# Patient Record
Sex: Female | Born: 1935 | Race: White | Hispanic: No | Marital: Married | Smoking: Never smoker
Health system: Southern US, Community
[De-identification: ages and names within clinical notes are randomized; demographics above are authoritative.]

## PROBLEM LIST (undated history)

## (undated) DIAGNOSIS — Z0000 Encounter for general adult medical examination without abnormal findings: Secondary | ICD-10-CM

## (undated) DIAGNOSIS — I10 Essential (primary) hypertension: Secondary | ICD-10-CM

## (undated) DIAGNOSIS — H5440 Blindness, one eye, unspecified eye: Secondary | ICD-10-CM

## (undated) DIAGNOSIS — C801 Malignant (primary) neoplasm, unspecified: Secondary | ICD-10-CM

## (undated) DIAGNOSIS — Z8719 Personal history of other diseases of the digestive system: Secondary | ICD-10-CM

## (undated) DIAGNOSIS — K5900 Constipation, unspecified: Secondary | ICD-10-CM

## (undated) DIAGNOSIS — M8580 Other specified disorders of bone density and structure, unspecified site: Secondary | ICD-10-CM

## (undated) DIAGNOSIS — K635 Polyp of colon: Secondary | ICD-10-CM

## (undated) DIAGNOSIS — T7840XA Allergy, unspecified, initial encounter: Secondary | ICD-10-CM

## (undated) DIAGNOSIS — Z973 Presence of spectacles and contact lenses: Secondary | ICD-10-CM

## (undated) DIAGNOSIS — R351 Nocturia: Secondary | ICD-10-CM

## (undated) DIAGNOSIS — R739 Hyperglycemia, unspecified: Secondary | ICD-10-CM

## (undated) DIAGNOSIS — K5792 Diverticulitis of intestine, part unspecified, without perforation or abscess without bleeding: Secondary | ICD-10-CM

## (undated) DIAGNOSIS — B09 Unspecified viral infection characterized by skin and mucous membrane lesions: Secondary | ICD-10-CM

## (undated) DIAGNOSIS — K4090 Unilateral inguinal hernia, without obstruction or gangrene, not specified as recurrent: Secondary | ICD-10-CM

## (undated) DIAGNOSIS — M1990 Unspecified osteoarthritis, unspecified site: Secondary | ICD-10-CM

## (undated) DIAGNOSIS — R7303 Prediabetes: Secondary | ICD-10-CM

## (undated) DIAGNOSIS — Z8619 Personal history of other infectious and parasitic diseases: Secondary | ICD-10-CM

## (undated) DIAGNOSIS — Z86010 Personal history of colonic polyps: Secondary | ICD-10-CM

## (undated) DIAGNOSIS — D1720 Benign lipomatous neoplasm of skin and subcutaneous tissue of unspecified limb: Secondary | ICD-10-CM

## (undated) DIAGNOSIS — H409 Unspecified glaucoma: Secondary | ICD-10-CM

## (undated) DIAGNOSIS — K5790 Diverticulosis of intestine, part unspecified, without perforation or abscess without bleeding: Secondary | ICD-10-CM

## (undated) HISTORY — DX: Diverticulitis of intestine, part unspecified, without perforation or abscess without bleeding: K57.92

## (undated) HISTORY — DX: Nocturia: R35.1

## (undated) HISTORY — DX: Unilateral inguinal hernia, without obstruction or gangrene, not specified as recurrent: K40.90

## (undated) HISTORY — DX: Constipation, unspecified: K59.00

## (undated) HISTORY — DX: Blindness, one eye, unspecified eye: H54.40

## (undated) HISTORY — PX: APPENDECTOMY: SHX54

## (undated) HISTORY — DX: Unspecified viral infection characterized by skin and mucous membrane lesions: B09

## (undated) HISTORY — PX: ABDOMINAL HYSTERECTOMY: SHX81

## (undated) HISTORY — PX: EYE SURGERY: SHX253

## (undated) HISTORY — DX: Diverticulosis of intestine, part unspecified, without perforation or abscess without bleeding: K57.90

## (undated) HISTORY — DX: Benign lipomatous neoplasm of skin and subcutaneous tissue of unspecified limb: D17.20

## (undated) HISTORY — DX: Essential (primary) hypertension: I10

## (undated) HISTORY — PX: CATARACT EXTRACTION, BILATERAL: SHX1313

## (undated) HISTORY — DX: Allergy, unspecified, initial encounter: T78.40XA

## (undated) HISTORY — DX: Polyp of colon: K63.5

## (undated) HISTORY — PX: COLONOSCOPY W/ BIOPSIES AND POLYPECTOMY: SHX1376

## (undated) HISTORY — PX: BREAST LUMPECTOMY: SHX2

## (undated) HISTORY — DX: Hyperglycemia, unspecified: R73.9

## (undated) HISTORY — DX: Other specified disorders of bone density and structure, unspecified site: M85.80

## (undated) HISTORY — DX: Personal history of other infectious and parasitic diseases: Z86.19

## (undated) HISTORY — DX: Personal history of colonic polyps: Z86.010

## (undated) HISTORY — DX: Encounter for general adult medical examination without abnormal findings: Z00.00

## (undated) HISTORY — PX: RECTOCELE REPAIR: SHX761

---

## 1999-04-30 ENCOUNTER — Ambulatory Visit (HOSPITAL_COMMUNITY): Admission: RE | Admit: 1999-04-30 | Discharge: 1999-04-30 | Payer: Self-pay | Admitting: Specialist

## 2001-10-05 HISTORY — PX: BLADDER SURGERY: SHX569

## 2010-11-10 ENCOUNTER — Emergency Department (INDEPENDENT_AMBULATORY_CARE_PROVIDER_SITE_OTHER): Payer: Medicare Other

## 2010-11-10 ENCOUNTER — Emergency Department (HOSPITAL_BASED_OUTPATIENT_CLINIC_OR_DEPARTMENT_OTHER)
Admission: EM | Admit: 2010-11-10 | Discharge: 2010-11-10 | Disposition: A | Discharge status: Home / Self Care | Payer: Medicare Other | Attending: Emergency Medicine | Admitting: Emergency Medicine

## 2010-11-10 DIAGNOSIS — I10 Essential (primary) hypertension: Secondary | ICD-10-CM | POA: Insufficient documentation

## 2010-11-10 DIAGNOSIS — S0990XA Unspecified injury of head, initial encounter: Secondary | ICD-10-CM | POA: Insufficient documentation

## 2010-11-10 DIAGNOSIS — W2209XA Striking against other stationary object, initial encounter: Secondary | ICD-10-CM | POA: Insufficient documentation

## 2010-11-10 DIAGNOSIS — R071 Chest pain on breathing: Secondary | ICD-10-CM | POA: Insufficient documentation

## 2010-11-10 DIAGNOSIS — R079 Chest pain, unspecified: Secondary | ICD-10-CM | POA: Insufficient documentation

## 2010-11-10 DIAGNOSIS — Y93K1 Activity, walking an animal: Secondary | ICD-10-CM | POA: Insufficient documentation

## 2010-11-10 LAB — CBC
Hemoglobin: 13.7 g/dL (ref 12.0–15.0)
MCH: 31.6 pg (ref 26.0–34.0)
MCV: 88.7 fL (ref 78.0–100.0)
RBC: 4.33 MIL/uL (ref 3.87–5.11)

## 2010-11-10 LAB — COMPREHENSIVE METABOLIC PANEL
BUN: 17 mg/dL (ref 6–23)
CO2: 30 mEq/L (ref 19–32)
Chloride: 102 mEq/L (ref 96–112)
Creatinine, Ser: 0.8 mg/dL (ref 0.4–1.2)
GFR calc non Af Amer: >60 mL/min (ref >60)
Total Bilirubin: 0.7 mg/dL (ref 0.3–1.2)

## 2010-11-10 LAB — POCT CARDIAC MARKERS
CKMB, poc: 2 ng/mL (ref 1.0–8.0)
Myoglobin, poc: 142 ng/mL (ref 12–200)

## 2011-09-03 ENCOUNTER — Encounter: Payer: Self-pay | Admitting: Internal Medicine

## 2011-09-03 ENCOUNTER — Telehealth: Payer: Self-pay | Admitting: Internal Medicine

## 2011-09-03 ENCOUNTER — Ambulatory Visit (INDEPENDENT_AMBULATORY_CARE_PROVIDER_SITE_OTHER): Payer: Medicare Other | Admitting: Internal Medicine

## 2011-09-03 DIAGNOSIS — I10 Essential (primary) hypertension: Secondary | ICD-10-CM

## 2011-09-03 DIAGNOSIS — H409 Unspecified glaucoma: Secondary | ICD-10-CM

## 2011-09-03 DIAGNOSIS — Z79899 Other long term (current) drug therapy: Secondary | ICD-10-CM

## 2011-09-03 NOTE — Patient Instructions (Signed)
Please take 1/2 tablet of your amlodipine a day for 2 weeks. If your blood pressure remains normal then stop the amlodipine and continue to monitor your blood pressure. Please schedule cbc (401.9) and chem7 (v58.69) prior to next visit

## 2011-09-03 NOTE — Telephone Encounter (Signed)
Future lab order placed for the week of 10/26/11 and prelim order forwarded to the lab.

## 2011-09-06 DIAGNOSIS — H409 Unspecified glaucoma: Secondary | ICD-10-CM | POA: Insufficient documentation

## 2011-09-06 DIAGNOSIS — I10 Essential (primary) hypertension: Secondary | ICD-10-CM | POA: Insufficient documentation

## 2011-09-06 NOTE — Assessment & Plan Note (Signed)
Good control. Pt concerned over continued norvasc use. Do not feel norvasc contributing to eye problems. Decrease norvasc dose to 2.5mg  qd for ~2wks. Monitor bp and if remains normotensive then attempt dc and monitoring. Consider cbc and chem7 with next visit.

## 2011-09-06 NOTE — Progress Notes (Signed)
  Subjective:    Patient ID: Amber Morgan, female    DOB: Apr 04, 1927, 77 y.o.   MRN: [id number]  HPI Pt presents to clinic to est care and for follow up of HTN. Has chronic decreased visual acuity followed by optho with h/o ?glaucoma. States she is concerned norvasc is causing swelling in eye. optho has not identified the medication as adverse. BP reviewed as normotensive. bp log reviewed. Stable mild leg swelling unchanged after taking norvasc. States labs utd with pmd. No other complaints.  Past Medical History  Diagnosis Date  . History of chicken pox     childhood  . Diverticulitis   . HTN (hypertension)   . Colon polyps   . Urinary incontinence    Past Surgical History  Procedure Date  . Eye surgery     multiple bilateral  . Bladder surgery 2003    bladder tact  . Abdominal hysterectomy     reports that she has never smoked. She has never used smokeless tobacco. She reports that she does not drink alcohol. Her drug history not on file. family history includes Colon cancer in her mother; Heart disease in her father; and Hypertension in her mother. No Known Allergies   Review of Systems  Eyes: Positive for visual disturbance.  Cardiovascular: Positive for leg swelling.  All other systems reviewed and are negative.       Objective:   Physical Exam  Physical Exam  Nursing note and vitals reviewed. Constitutional: Appears well-developed and well-nourished. No distress.  HENT:  Head: Normocephalic and atraumatic.  Right Ear: External ear normal.  Left Ear: External ear normal.  Eyes: Conjunctivae are normal. No scleral icterus.  Neck: Neck supple. Carotid bruit is not present.  Cardiovascular: Normal rate, regular rhythm and normal heart sounds.  Exam reveals no gallop and no friction rub.   No murmur heard. Pulmonary/Chest: Effort normal and breath sounds normal. No respiratory distress. He has no wheezes. no rales.  Lymphadenopathy:    He has no cervical  adenopathy.  Neurological:Alert.  Skin: Skin is warm and dry. Not diaphoretic.  Psychiatric: Has a normal mood and affect.        Assessment & Plan:

## 2011-09-16 ENCOUNTER — Telehealth: Payer: Self-pay | Admitting: Internal Medicine

## 2011-09-16 NOTE — Telephone Encounter (Signed)
Received medical records from [facility]-Dr. Brandt Loosen

## 2011-10-27 ENCOUNTER — Other Ambulatory Visit: Payer: Self-pay | Admitting: Internal Medicine

## 2011-10-28 LAB — CBC WITH DIFFERENTIAL/PLATELET
Eosinophils Absolute: 0 10*3/uL (ref 0.0–0.7)
Hemoglobin: 13.8 g/dL (ref 12.0–15.0)
Lymphs Abs: 2.2 10*3/uL (ref 0.7–4.0)
MCH: 31.3 pg (ref 26.0–34.0)
MCV: 95.2 fL (ref 78.0–100.0)
Monocytes Relative: 8 % (ref 3–12)
Neutrophils Relative %: 61 % (ref 43–77)
RBC: 4.41 MIL/uL (ref 3.87–5.11)

## 2011-10-28 LAB — BASIC METABOLIC PANEL
CO2: 34 mEq/L — ABNORMAL HIGH (ref 19–32)
Calcium: 10.4 mg/dL (ref 8.4–10.5)
Chloride: 95 mEq/L — ABNORMAL LOW (ref 96–112)
Glucose, Bld: 95 mg/dL (ref 70–99)
Sodium: 136 mEq/L (ref 135–145)

## 2011-11-02 ENCOUNTER — Ambulatory Visit (INDEPENDENT_AMBULATORY_CARE_PROVIDER_SITE_OTHER): Payer: Medicare Other | Admitting: Internal Medicine

## 2011-11-02 ENCOUNTER — Encounter: Payer: Self-pay | Admitting: Internal Medicine

## 2011-11-02 DIAGNOSIS — E785 Hyperlipidemia, unspecified: Secondary | ICD-10-CM

## 2011-11-02 DIAGNOSIS — I10 Essential (primary) hypertension: Secondary | ICD-10-CM

## 2011-11-02 MED ORDER — LOSARTAN POTASSIUM 50 MG PO TABS: 50.0000 mg | ORAL_TABLET | Freq: Every day | ORAL | Status: DC

## 2011-11-02 NOTE — Patient Instructions (Signed)
Please take your atenolol every other day for 10 days then stop the medication. Begin your new blood pressure medication losartan now- taking it once a day in the morning.

## 2011-11-14 DIAGNOSIS — E785 Hyperlipidemia, unspecified: Secondary | ICD-10-CM | POA: Insufficient documentation

## 2011-11-14 NOTE — Progress Notes (Signed)
  Subjective:    Patient ID: Amber Morgan, female    DOB: 30-Apr-1927, 78 y.o.   MRN: [id number]  HPI Pt presents to clinic for followup of multiple medical problems. BP reviewed as nl. States pm bp 140's. Successfully off norvasc. Worried about beta blocker with beta blocker gtts. Reviewed nl cbc, chem7. No other complaint.  Past Medical History  Diagnosis Date  . History of chicken pox     childhood  . Diverticulitis   . HTN (hypertension)   . Colon polyps   . Urinary incontinence    Past Surgical History  Procedure Date  . Eye surgery     multiple bilateral  . Bladder surgery 2003    bladder tact  . Abdominal hysterectomy     reports that she has never smoked. She has never used smokeless tobacco. She reports that she does not drink alcohol. Her drug history not on file. family history includes Colon cancer in her mother; Heart disease in her father; and Hypertension in her mother. No Known Allergies    Review of Systems see hpi     Objective:   Physical Exam  Nursing note and vitals reviewed. Constitutional: She appears well-developed and well-nourished. No distress.  HENT:  Head: Normocephalic and atraumatic.  Eyes: Conjunctivae are normal. No scleral icterus.  Neck: Neck supple.  Cardiovascular: Normal rate, regular rhythm and normal heart sounds.   Pulmonary/Chest: Effort normal and breath sounds normal.  Neurological: She is alert.  Skin: She is not diaphoretic.  Psychiatric: She has a normal mood and affect.          Assessment & Plan:

## 2011-11-14 NOTE — Assessment & Plan Note (Signed)
Wean atenolol off. Add losartan 50mg  qd. Monitor bp as outpt and follow up in clinic as scheduled.

## 2011-11-14 NOTE — Assessment & Plan Note (Signed)
Not currently requiring medication. Obtain lipid/lft prior to next visit

## 2011-11-24 ENCOUNTER — Ambulatory Visit (INDEPENDENT_AMBULATORY_CARE_PROVIDER_SITE_OTHER): Payer: Medicare Other | Admitting: Internal Medicine

## 2011-11-24 ENCOUNTER — Encounter: Payer: Self-pay | Admitting: Internal Medicine

## 2011-11-24 DIAGNOSIS — I10 Essential (primary) hypertension: Secondary | ICD-10-CM

## 2011-11-24 MED ORDER — ATENOLOL 25 MG PO TABS: 25.0000 mg | ORAL_TABLET | Freq: Every day | ORAL | Status: DC

## 2011-11-24 NOTE — Assessment & Plan Note (Signed)
Loss of BP control. Resume original regimen. rf atenolol. Keep f/u appt and call with bp or other concerns.

## 2011-11-24 NOTE — Progress Notes (Signed)
  Subjective:    Patient ID: Amber Morgan, female    DOB: [birth year], 78 y.o.   MRN: [id number]  HPI Pt presents to clinic for follow up of HTN. Last visit weaned off betablocker and arb was added. BP's were under average control until last several days had sbp 150's-170's. Asx. Was concerned and went back to original regimen atenolol, hctz and norvasc with normalization of bp's. Feels more comfortable with this regimen.  Past Medical History  Diagnosis Date  . History of chicken pox     childhood  . Diverticulitis   . HTN (hypertension)   . Colon polyps   . Urinary incontinence    Past Surgical History  Procedure Date  . Eye surgery     multiple bilateral  . Bladder surgery 2003    bladder tact  . Abdominal hysterectomy     reports that she has never smoked. She has never used smokeless tobacco. She reports that she does not drink alcohol. Her drug history not on file. family history includes Colon cancer in her mother; Heart disease in her father; and Hypertension in her mother. No Known Allergies   Review of Systems see hpi     Objective:   Physical Exam  Nursing note and vitals reviewed. Constitutional: She appears well-developed and well-nourished. No distress.  HENT:  Head: Normocephalic and atraumatic.  Neurological: She is alert.  Skin: She is not diaphoretic.  Psychiatric: She has a normal mood and affect.          Assessment & Plan:

## 2011-12-28 ENCOUNTER — Ambulatory Visit: Payer: Medicare Other | Admitting: Internal Medicine

## 2011-12-31 ENCOUNTER — Ambulatory Visit: Payer: Medicare Other | Admitting: Internal Medicine

## 2012-01-13 ENCOUNTER — Telehealth: Payer: Self-pay | Admitting: Internal Medicine

## 2012-01-13 NOTE — Telephone Encounter (Signed)
Patient is unsure if she needs labs prior to 01/26/12 visit?

## 2012-01-13 NOTE — Telephone Encounter (Signed)
Too soon. Next f/u visit will get labs

## 2012-01-13 NOTE — Telephone Encounter (Signed)
Labs drawn in January for cbc and bmp; there is no future order for labs requested.

## 2012-01-13 NOTE — Telephone Encounter (Signed)
Call placed to patient at [phone], she was informed per Dr Rodena Medin instructions.

## 2012-01-18 ENCOUNTER — Encounter: Payer: Self-pay | Admitting: Internal Medicine

## 2012-01-26 ENCOUNTER — Ambulatory Visit (INDEPENDENT_AMBULATORY_CARE_PROVIDER_SITE_OTHER): Payer: Medicare Other | Admitting: Internal Medicine

## 2012-01-26 ENCOUNTER — Encounter: Payer: Self-pay | Admitting: Internal Medicine

## 2012-01-26 ENCOUNTER — Telehealth: Payer: Self-pay | Admitting: Internal Medicine

## 2012-01-26 VITALS — BP 132/72 | HR 61 | Temp 97.7°F | Ht 59.5 in | Wt 129.0 lb

## 2012-01-26 DIAGNOSIS — L0390 Cellulitis, unspecified: Secondary | ICD-10-CM

## 2012-01-26 DIAGNOSIS — Z79899 Other long term (current) drug therapy: Secondary | ICD-10-CM

## 2012-01-26 DIAGNOSIS — E785 Hyperlipidemia, unspecified: Secondary | ICD-10-CM

## 2012-01-26 DIAGNOSIS — I10 Essential (primary) hypertension: Secondary | ICD-10-CM

## 2012-01-26 MED ORDER — CEPHALEXIN 500 MG PO CAPS: 500.0000 mg | ORAL_CAPSULE | Freq: Three times a day (TID) | ORAL | Status: DC

## 2012-01-26 NOTE — Progress Notes (Signed)
  Subjective:    Patient ID: Amber Morgan, female    DOB: 1926-12-03, 78 y.o.   MRN: [id number]  HPI Pt presents to clinic for followup of multiple medical problems. Tolerating medication without adverse effect. Home bp log reviewed primarily normotensive with rare exception. Notes two weeks ago while trimming roses stuck right thumb with thorn. Has persistent mild st swelling and erythema without f/c or drainage. Taking no medication for the problem. No alleviating or exacerbating factors.   Past Medical History  Diagnosis Date  . History of chicken pox     childhood  . Diverticulitis   . HTN (hypertension)   . Colon polyps   . Urinary incontinence    Past Surgical History  Procedure Date  . Eye surgery     multiple bilateral  . Bladder surgery 2003    bladder tact  . Abdominal hysterectomy     reports that she has never smoked. She has never used smokeless tobacco. She reports that she does not drink alcohol. Her drug history not on file. family history includes Colon cancer in her mother; Heart disease in her father; and Hypertension in her mother. No Known Allergies    Review of Systems see hpi     Objective:   Physical Exam  Physical Exam  Nursing note and vitals reviewed. Constitutional: Appears well-developed and well-nourished. No distress.  HENT:  Head: Normocephalic and atraumatic.  Right Ear: External ear normal.  Left Ear: External ear normal.  Eyes: Conjunctivae are normal. No scleral icterus.  Neck: Neck supple. Carotid bruit is not present.  Cardiovascular: Normal rate, regular rhythm and normal heart sounds.  Exam reveals no gallop and no friction rub.   No murmur heard. Pulmonary/Chest: Effort normal and breath sounds normal. No respiratory distress. He has no wheezes. no rales.  Lymphadenopathy:    He has no cervical adenopathy.  Neurological:Alert.  Skin: Skin is warm and dry. Not diaphoretic. right thumb- mild erythema. No wound/opening. No  discharge. NT. FROM Psychiatric: Has a normal mood and affect.        Assessment & Plan:

## 2012-01-26 NOTE — Patient Instructions (Signed)
Please schedule fasting labs prior to next visit Lipid/lft-272.4 and chem7-v58.69 

## 2012-01-26 NOTE — Assessment & Plan Note (Signed)
Attempt po course of abx. Followup if no improvement or worsening.  

## 2012-01-26 NOTE — Telephone Encounter (Signed)
Lab order entered for July 2013. 

## 2012-01-26 NOTE — Assessment & Plan Note (Signed)
Normotensive and stable. Continue current regimen. Monitor bp as outpt and followup in clinic as scheduled. Obtain chem7 prior to next visit 

## 2012-01-26 NOTE — Assessment & Plan Note (Signed)
Obtain lipid/lft prior to next visit 

## 2012-02-01 ENCOUNTER — Telehealth: Payer: Self-pay | Admitting: *Deleted

## 2012-02-01 NOTE — Telephone Encounter (Signed)
Patient called and left voice message stating she was given a Rx for antibiotic for her finger. She is still having her tingling in her thumb radiating up her arm. Her message states that she still has a hard spot.  Call was returned to patient at [phone]. Patient states she will taking the last antibiotic today and was not sure if she needed to refill the medication. She stated she still has a hard place on her thumb and tingling. She was asked about redness and swelling. She stated there is no swelling or redness. She was advised to complete antibiotics and if there is no improvement over the next couple of days she was advised to call back. Patient verbalized understanding and agrees as instructed.

## 2012-02-03 ENCOUNTER — Ambulatory Visit (HOSPITAL_BASED_OUTPATIENT_CLINIC_OR_DEPARTMENT_OTHER)
Admission: RE | Admit: 2012-02-03 | Discharge: 2012-02-03 | Disposition: A | Discharge status: Home / Self Care | Payer: Medicare Other | Source: Ambulatory Visit | Attending: Internal Medicine | Admitting: Internal Medicine

## 2012-02-03 ENCOUNTER — Ambulatory Visit (INDEPENDENT_AMBULATORY_CARE_PROVIDER_SITE_OTHER): Payer: Medicare Other | Admitting: Internal Medicine

## 2012-02-03 ENCOUNTER — Encounter: Payer: Self-pay | Admitting: Internal Medicine

## 2012-02-03 VITALS — BP 124/60 | HR 56 | Temp 97.5°F | Resp 16 | Wt 129.0 lb

## 2012-02-03 DIAGNOSIS — M79644 Pain in right finger(s): Secondary | ICD-10-CM

## 2012-02-03 DIAGNOSIS — L0291 Cutaneous abscess, unspecified: Secondary | ICD-10-CM

## 2012-02-03 DIAGNOSIS — S61209A Unspecified open wound of unspecified finger without damage to nail, initial encounter: Secondary | ICD-10-CM

## 2012-02-03 DIAGNOSIS — L0390 Cellulitis, unspecified: Secondary | ICD-10-CM

## 2012-02-03 DIAGNOSIS — Z043 Encounter for examination and observation following other accident: Secondary | ICD-10-CM

## 2012-02-03 DIAGNOSIS — M19049 Primary osteoarthritis, unspecified hand: Secondary | ICD-10-CM | POA: Insufficient documentation

## 2012-02-03 DIAGNOSIS — M79609 Pain in unspecified limb: Secondary | ICD-10-CM

## 2012-02-03 DIAGNOSIS — X58XXXA Exposure to other specified factors, initial encounter: Secondary | ICD-10-CM

## 2012-02-03 MED ORDER — AMOXICILLIN-POT CLAVULANATE 875-125 MG PO TABS: 1.0000 | ORAL_TABLET | Freq: Two times a day (BID) | ORAL | Status: AC

## 2012-02-03 NOTE — Assessment & Plan Note (Signed)
Obtain plain xray of thumb. reattempt abx course with augmentin.

## 2012-02-03 NOTE — Progress Notes (Signed)
  Subjective:    Patient ID: Amber Morgan, female    DOB: 1927/09/11, 78 y.o.   MRN: [id number]  HPI Pt presents to clinic for re-evaluation of thumb swelling. S/p thorn puncture would s/p abx course completed yesterday. Notes improvement of redness and st swelling but notes firm area along ventral thumb. No drainage. Has FROM. No alleviating or exacerbating factors. Taking no medication currently for the problem. No other complaint  Past Medical History  Diagnosis Date  . History of chicken pox     childhood  . Diverticulitis   . HTN (hypertension)   . Colon polyps   . Urinary incontinence    Past Surgical History  Procedure Date  . Eye surgery     multiple bilateral  . Bladder surgery 2003    bladder tact  . Abdominal hysterectomy     reports that she has never smoked. She has never used smokeless tobacco. She reports that she does not drink alcohol. Her drug history not on file. family history includes Colon cancer in her mother; Heart disease in her father; and Hypertension in her mother. No Known Allergies   Review of Systems see hpi     Objective:   Physical Exam  Nursing note and vitals reviewed. Constitutional: She appears well-developed and well-nourished. No distress.  HENT:  Head: Normocephalic and atraumatic.  Eyes: Conjunctivae are normal. No scleral icterus.  Neurological: She is alert.  Skin: Skin is warm and dry. No rash noted. She is not diaphoretic. No erythema.       Right thumb: FROM. NT. No erythema warm or drainge. No wound. Sl prominence right ventral thumb. No mass appreciated  Psychiatric: She has a normal mood and affect.          Assessment & Plan:

## 2012-04-18 LAB — LIPID PANEL
Cholesterol: 190 mg/dL (ref 0–200)
Triglycerides: 85 mg/dL (ref <150)

## 2012-04-18 NOTE — Addendum Note (Signed)
Addended by: Regis Bill on: 04/18/2012 09:09 AM   Modules accepted: Orders

## 2012-04-19 LAB — BASIC METABOLIC PANEL
BUN: 21 mg/dL (ref 6–23)
Calcium: 9.8 mg/dL (ref 8.4–10.5)
Creat: 0.88 mg/dL (ref 0.50–1.10)
Glucose, Bld: 95 mg/dL (ref 70–99)

## 2012-04-19 LAB — HEPATIC FUNCTION PANEL
ALT: 17 U/L (ref 0–35)
AST: 20 U/L (ref 0–37)
Bilirubin, Direct: 0.1 mg/dL (ref 0.0–0.3)
Indirect Bilirubin: 0.6 mg/dL (ref 0.0–0.9)
Total Protein: 6.2 g/dL (ref 6.0–8.3)

## 2012-04-26 ENCOUNTER — Encounter: Payer: Self-pay | Admitting: Internal Medicine

## 2012-04-26 ENCOUNTER — Telehealth: Payer: Self-pay | Admitting: Internal Medicine

## 2012-04-26 ENCOUNTER — Ambulatory Visit (INDEPENDENT_AMBULATORY_CARE_PROVIDER_SITE_OTHER): Payer: Medicare Other | Admitting: Internal Medicine

## 2012-04-26 VITALS — BP 120/72 | HR 56 | Temp 97.7°F | Resp 14 | Wt 127.0 lb

## 2012-04-26 DIAGNOSIS — I10 Essential (primary) hypertension: Secondary | ICD-10-CM

## 2012-04-26 DIAGNOSIS — R209 Unspecified disturbances of skin sensation: Secondary | ICD-10-CM

## 2012-04-26 DIAGNOSIS — E785 Hyperlipidemia, unspecified: Secondary | ICD-10-CM

## 2012-04-26 DIAGNOSIS — R202 Paresthesia of skin: Secondary | ICD-10-CM

## 2012-04-26 DIAGNOSIS — Z79899 Other long term (current) drug therapy: Secondary | ICD-10-CM

## 2012-04-26 NOTE — Assessment & Plan Note (Signed)
Suspect distal arm nerve impingement but spontaneously improving. Continue to monitor.

## 2012-04-26 NOTE — Progress Notes (Signed)
  Subjective:    Patient ID: Amber Morgan, female    DOB: Nov 03, 1926, 78 y.o.   MRN: [id number]  HPI Pt presents to clinic for followup of multiple medical problems. BP reviewed normotensive and home values nl as well. Notes intermittent dry mouth and increased urination late in the day and evening. Takes hctz in the am. Has intermittent right thumb tingling that may radiate retrograde to lower arm. No neck pain, weakness or radicular neck pain. Feels like the sensation is spontaneously improving.   Past Medical History  Diagnosis Date  . History of chicken pox     childhood  . Diverticulitis   . HTN (hypertension)   . Colon polyps   . Urinary incontinence    Past Surgical History  Procedure Date  . Eye surgery     multiple bilateral  . Bladder surgery 2003    bladder tact  . Abdominal hysterectomy     reports that she has never smoked. She has never used smokeless tobacco. She reports that she does not drink alcohol. Her drug history not on file. family history includes Colon cancer in her mother; Heart disease in her father; and Hypertension in her mother. No Known Allergies    Review of Systems see hpi     Objective:   Physical Exam  Nursing note and vitals reviewed. Constitutional: She appears well-developed and well-nourished. No distress.  HENT:  Head: Normocephalic and atraumatic.  Eyes: Conjunctivae are normal. No scleral icterus.  Musculoskeletal:       Right hand/arm -FROM. No thenar muscle wasting. Phalen's negative  Neurological: She is alert.  Skin: Skin is warm and dry. She is not diaphoretic.  Psychiatric: She has a normal mood and affect.          Assessment & Plan:

## 2012-04-26 NOTE — Patient Instructions (Signed)
Please schedule labs prior to your next appointment chem7-v58.69, cbc-401.9

## 2012-04-26 NOTE — Assessment & Plan Note (Signed)
Reviewed lipid with good control.

## 2012-04-26 NOTE — Assessment & Plan Note (Signed)
Normotensive and stable. Continue current regimen. Monitor bp as outpt and followup in clinic as scheduled.  

## 2012-05-11 NOTE — Telephone Encounter (Signed)
Future Lab orders placed/SLS

## 2012-07-13 ENCOUNTER — Ambulatory Visit (INDEPENDENT_AMBULATORY_CARE_PROVIDER_SITE_OTHER): Payer: Medicare Other

## 2012-07-13 DIAGNOSIS — Z23 Encounter for immunization: Secondary | ICD-10-CM

## 2012-08-08 ENCOUNTER — Other Ambulatory Visit: Payer: Self-pay | Admitting: *Deleted

## 2012-08-08 MED ORDER — ATENOLOL 25 MG PO TABS: 25.0000 mg | ORAL_TABLET | Freq: Every day | ORAL | Status: DC

## 2012-08-08 MED ORDER — AMLODIPINE BESYLATE 5 MG PO TABS: 5.0000 mg | ORAL_TABLET | Freq: Every day | ORAL | Status: DC

## 2012-08-08 MED ORDER — HYDROCHLOROTHIAZIDE 25 MG PO TABS: 25.0000 mg | ORAL_TABLET | Freq: Every day | ORAL | Status: DC

## 2012-08-08 NOTE — Progress Notes (Signed)
Per patient request/SLS 

## 2012-08-15 LAB — CBC WITH DIFFERENTIAL/PLATELET
Basophils Absolute: 0 10*3/uL (ref 0.0–0.1)
HCT: 35.9 % — ABNORMAL LOW (ref 36.0–46.0)
Hemoglobin: 12.5 g/dL (ref 12.0–15.0)
Lymphocytes Relative: 33 % (ref 12–46)
Lymphs Abs: 1.9 10*3/uL (ref 0.7–4.0)
Monocytes Absolute: 0.5 10*3/uL (ref 0.1–1.0)
Monocytes Relative: 9 % (ref 3–12)
Neutro Abs: 3.3 10*3/uL (ref 1.7–7.7)
RBC: 4.03 MIL/uL (ref 3.87–5.11)
RDW: 13.1 % (ref 11.5–15.5)
WBC: 5.8 10*3/uL (ref 4.0–10.5)

## 2012-08-15 LAB — BASIC METABOLIC PANEL
Calcium: 9.5 mg/dL (ref 8.4–10.5)
Sodium: 134 mEq/L — ABNORMAL LOW (ref 135–145)

## 2012-08-15 NOTE — Addendum Note (Signed)
Addended by: Regis Bill on: 08/15/2012 11:24 AM   Modules accepted: Orders

## 2012-08-15 NOTE — Telephone Encounter (Signed)
Lab orders released/SLS 

## 2012-08-22 ENCOUNTER — Encounter: Payer: Self-pay | Admitting: Internal Medicine

## 2012-08-22 ENCOUNTER — Ambulatory Visit (INDEPENDENT_AMBULATORY_CARE_PROVIDER_SITE_OTHER): Payer: Medicare Other | Admitting: Internal Medicine

## 2012-08-22 VITALS — BP 114/58 | HR 49 | Temp 98.0°F | Resp 14 | Wt 128.2 lb

## 2012-08-22 DIAGNOSIS — I10 Essential (primary) hypertension: Secondary | ICD-10-CM

## 2012-08-22 DIAGNOSIS — R202 Paresthesia of skin: Secondary | ICD-10-CM

## 2012-08-22 DIAGNOSIS — R209 Unspecified disturbances of skin sensation: Secondary | ICD-10-CM

## 2012-08-22 NOTE — Progress Notes (Signed)
  Subjective:    Patient ID: Amber Morgan, female    DOB: May 28, 1927, 78 y.o.   MRN: [id number]  HPI Pt presents to clinic for followup of multiple medical problems. Previous right thumb numbness after injury has resolved. Blood pressure reviewed as normotensive. Weight stable. Reviewed recent Chem-7 was only minimally low sodium. Mental status and volume status felt to be normal. Compliant with medication without adverse effect.  Past Medical History  Diagnosis Date  . History of chicken pox     childhood  . Diverticulitis   . HTN (hypertension)   . Colon polyps   . Urinary incontinence    Past Surgical History  Procedure Date  . Eye surgery     multiple bilateral  . Bladder surgery 2003    bladder tact  . Abdominal hysterectomy     reports that she has never smoked. She has never used smokeless tobacco. She reports that she does not drink alcohol. Her drug history not on file. family history includes Colon cancer in her mother; Heart disease in her father; and Hypertension in her mother. No Known Allergies    Review of Systems see hpi     Objective:   Physical Exam  Physical Exam  Nursing note and vitals reviewed. Constitutional: Appears well-developed and well-nourished. No distress.  HENT:  Head: Normocephalic and atraumatic.  Right Ear: External ear normal.  Left Ear: External ear normal.  Eyes: Conjunctivae are normal. No scleral icterus.  Neck: Neck supple. Carotid bruit is not present.  Cardiovascular: Normal rate, regular rhythm and normal heart sounds.  Exam reveals no gallop and no friction rub.   No murmur heard. Pulmonary/Chest: Effort normal and breath sounds normal. No respiratory distress. He has no wheezes. no rales.  Lymphadenopathy:    He has no cervical adenopathy.  Neurological:Alert.  Skin: Skin is warm and dry. Not diaphoretic.  Psychiatric: Has a normal mood and affect.        Assessment & Plan:

## 2012-08-22 NOTE — Patient Instructions (Signed)
Please schedule chem7 v58.69 prior to next visit 

## 2012-08-26 NOTE — Assessment & Plan Note (Signed)
Resolved

## 2012-08-26 NOTE — Assessment & Plan Note (Signed)
Normotensive and stable. Continue current regimen. Monitor bp as outpt and followup in clinic as scheduled.  

## 2012-10-31 ENCOUNTER — Telehealth: Payer: Self-pay

## 2012-10-31 DIAGNOSIS — Z79899 Other long term (current) drug therapy: Secondary | ICD-10-CM

## 2012-10-31 NOTE — Telephone Encounter (Signed)
Labs ordered.

## 2012-10-31 NOTE — Addendum Note (Signed)
Addended by: Court Joy on: 10/31/2012 05:10 PM   Modules accepted: Orders

## 2012-11-01 LAB — BASIC METABOLIC PANEL
BUN: 15 mg/dL (ref 6–23)
CO2: 33 mEq/L — ABNORMAL HIGH (ref 19–32)
Chloride: 101 mEq/L (ref 96–112)
Potassium: 4.2 mEq/L (ref 3.5–5.3)

## 2012-11-01 NOTE — Telephone Encounter (Signed)
Quick Note:  Patient Informed and voiced understanding ______ 

## 2012-11-08 ENCOUNTER — Ambulatory Visit (INDEPENDENT_AMBULATORY_CARE_PROVIDER_SITE_OTHER): Payer: Medicare Other | Admitting: Family Medicine

## 2012-11-08 ENCOUNTER — Encounter: Payer: Self-pay | Admitting: Family Medicine

## 2012-11-08 VITALS — BP 138/64 | HR 54 | Temp 97.4°F | Ht 59.25 in | Wt 131.1 lb

## 2012-11-08 DIAGNOSIS — I10 Essential (primary) hypertension: Secondary | ICD-10-CM

## 2012-11-08 DIAGNOSIS — H409 Unspecified glaucoma: Secondary | ICD-10-CM

## 2012-11-08 NOTE — Patient Instructions (Addendum)
Rel of rec Dr Hazle Quant, last clinic note

## 2012-11-12 NOTE — Assessment & Plan Note (Signed)
Well controlled today, no changes to meds. 

## 2012-11-12 NOTE — Assessment & Plan Note (Signed)
Following with opthamology and tolerating drops

## 2012-11-12 NOTE — Progress Notes (Signed)
Patient ID: Amber Morgan, female   DOB: [birth year], 79 y.o.   MRN: [id number] Amber Morgan [id number] 1926/11/11 11/12/2012      Progress Note-Follow Up  Subjective  Chief Complaint  Chief Complaint  Patient presents with  . Follow-up    3 month     HPI  Patient is an 79 year old Caucasian female who is in today with her husband. She generally been in good health. No recent illness. No fevers, headache, chest pain, palpitations, shortness of breath, GI or GU complaints. She's recently been diagnosed with glaucoma but is tolerating her treatments well. No other new complaints noted.  Past Medical History  Diagnosis Date  . History of chicken pox     childhood  . Diverticulitis   . HTN (hypertension)   . Colon polyps   . Urinary incontinence     Past Surgical History  Procedure Laterality Date  . Eye surgery      multiple bilateral  . Bladder surgery  2003    bladder tact  . Abdominal hysterectomy      Family History  Problem Relation Age of Onset  . Colon cancer Mother   . Hypertension Mother   . Heart disease Father     History   Social History  . Marital Status: Married    Spouse Name: N/A    Number of Children: N/A  . Years of Education: N/A   Occupational History  . Not on file.   Social History Main Topics  . Smoking status: Never Smoker   . Smokeless tobacco: Never Used  . Alcohol Use: No  . Drug Use: Not on file  . Sexually Active: Not on file   Other Topics Concern  . Not on file   Social History Narrative  . No narrative on file    Current Outpatient Prescriptions on File Prior to Visit  Medication Sig Dispense Refill  . amLODipine (NORVASC) 5 MG tablet Take 1 tablet (5 mg total) by mouth daily.  90 tablet  1  . atenolol (TENORMIN) 25 MG tablet Take 1 tablet (25 mg total) by mouth daily.  90 tablet  1  . hydrochlorothiazide (HYDRODIURIL) 25 MG tablet Take 1 tablet (25 mg total) by mouth daily.  90 tablet  1  . ketorolac (ACULAR) 0.5 %  ophthalmic solution Place 1 drop into the right eye 2 (two) times daily.      . Multiple Vitamin (MULTIVITAMIN) tablet Take 1 tablet by mouth daily.        Marland Kitchen Propylene Glycol (SYSTANE BALANCE OP) Apply 1 drop to eye daily as needed.      . timolol (TIMOPTIC) 0.25 % ophthalmic solution Place 1 drop into the left eye 2 (two) times daily.        No current facility-administered medications on file prior to visit.    No Known Allergies  Review of Systems  Review of Systems  Constitutional: Negative for fever and malaise/fatigue.  HENT: Negative for congestion.   Eyes: Negative for discharge.  Respiratory: Negative for shortness of breath.   Cardiovascular: Negative for chest pain, palpitations and leg swelling.  Gastrointestinal: Negative for nausea, abdominal pain and diarrhea.  Genitourinary: Negative for dysuria.  Musculoskeletal: Negative for falls.  Skin: Negative for rash.  Neurological: Negative for loss of consciousness and headaches.  Endo/Heme/Allergies: Negative for polydipsia.  Psychiatric/Behavioral: Negative for depression and suicidal ideas. The patient is not nervous/anxious and does not have insomnia.     Objective  BP 138/64  Pulse 54  Temp(Src) 97.4 F (36.3 C) (Oral)  Ht 4' 11.25" (1.505 m)  Wt 131 lb 1.3 oz (59.457 kg)  BMI 26.25 kg/m2  SpO2 90%  Physical Exam  Physical Exam  Constitutional: She is oriented to person, place, and time and well-developed, well-nourished, and in no distress. No distress.  HENT:  Head: Normocephalic and atraumatic.  Eyes: Conjunctivae are normal.  Neck: Neck supple. No thyromegaly present.  Cardiovascular: Normal rate, regular rhythm and normal heart sounds.   No murmur heard. Pulmonary/Chest: Effort normal and breath sounds normal. She has no wheezes.  Abdominal: She exhibits no distension and no mass.  Musculoskeletal: She exhibits no edema.  Lymphadenopathy:    She has no cervical adenopathy.  Neurological: She is  alert and oriented to person, place, and time.  Skin: Skin is warm and dry. No rash noted. She is not diaphoretic.  Psychiatric: Memory, affect and judgment normal.    No results found for this basename: TSH   Lab Results  Component Value Date   WBC 5.8 08/15/2012   HGB 12.5 08/15/2012   HCT 35.9* 08/15/2012   MCV 89.1 08/15/2012   PLT 225 08/15/2012   Lab Results  Component Value Date   CREATININE 0.79 10/31/2012   BUN 15 10/31/2012   NA 140 10/31/2012   K 4.2 10/31/2012   CL 101 10/31/2012   CO2 33* 10/31/2012   Lab Results  Component Value Date   ALT 17 04/18/2012   AST 20 04/18/2012   ALKPHOS 59 04/18/2012   BILITOT 0.7 04/18/2012   Lab Results  Component Value Date   CHOL 190 04/18/2012   Lab Results  Component Value Date   HDL 51 04/18/2012   Lab Results  Component Value Date   LDLCALC 122* 04/18/2012   Lab Results  Component Value Date   TRIG 85 04/18/2012   Lab Results  Component Value Date   CHOLHDL 3.7 04/18/2012     Assessment & Plan  HTN (hypertension) Well controlled today, no changes to meds.  Glaucoma Following with opthamology and tolerating drops

## 2013-01-24 ENCOUNTER — Telehealth: Payer: Self-pay

## 2013-01-24 NOTE — Telephone Encounter (Signed)
Per md inform pt that mammogram results were normal.   Patient informed and copy sent to be scanned

## 2013-02-03 ENCOUNTER — Encounter: Payer: Self-pay | Admitting: Family Medicine

## 2013-02-06 ENCOUNTER — Ambulatory Visit (INDEPENDENT_AMBULATORY_CARE_PROVIDER_SITE_OTHER): Payer: Medicare Other | Admitting: Family Medicine

## 2013-02-06 ENCOUNTER — Encounter: Payer: Self-pay | Admitting: Family Medicine

## 2013-02-06 ENCOUNTER — Telehealth: Payer: Self-pay | Admitting: Internal Medicine

## 2013-02-06 VITALS — BP 138/78 | HR 54 | Temp 97.8°F | Ht 59.5 in | Wt 127.1 lb

## 2013-02-06 DIAGNOSIS — I10 Essential (primary) hypertension: Secondary | ICD-10-CM

## 2013-02-06 DIAGNOSIS — R351 Nocturia: Secondary | ICD-10-CM

## 2013-02-06 DIAGNOSIS — D649 Anemia, unspecified: Secondary | ICD-10-CM

## 2013-02-06 DIAGNOSIS — R350 Frequency of micturition: Secondary | ICD-10-CM

## 2013-02-06 DIAGNOSIS — K5900 Constipation, unspecified: Secondary | ICD-10-CM

## 2013-02-06 DIAGNOSIS — E785 Hyperlipidemia, unspecified: Secondary | ICD-10-CM

## 2013-02-06 HISTORY — DX: Constipation, unspecified: K59.00

## 2013-02-06 LAB — CBC
MCH: 30.8 pg (ref 26.0–34.0)
MCHC: 34.3 g/dL (ref 30.0–36.0)
Platelets: 257 10*3/uL (ref 150–400)
RDW: 13.4 % (ref 11.5–15.5)

## 2013-02-06 MED ORDER — AMLODIPINE BESYLATE 5 MG PO TABS: 5.0000 mg | ORAL_TABLET | Freq: Every day | ORAL | Status: DC

## 2013-02-06 MED ORDER — ATENOLOL 25 MG PO TABS: 25.0000 mg | ORAL_TABLET | Freq: Every day | ORAL | Status: DC

## 2013-02-06 MED ORDER — HYDROCHLOROTHIAZIDE 25 MG PO TABS: 25.0000 mg | ORAL_TABLET | Freq: Every day | ORAL | Status: DC

## 2013-02-06 NOTE — Telephone Encounter (Signed)
Refill- hydrochlorothiazide 25mg  tab. Take one tablet by mouth every day. Qty 90 last fill 2.1.14  Refill- atenolol 25mg  tab. Take one tablet by mouth every day. Qty 90 last fill 2.1.14  Refill- amlodipine 5mg  tab. Take one tablet by mouth every day. Qty 90 last fill 2.1.14

## 2013-02-06 NOTE — Patient Instructions (Addendum)
  Next visit annual lipid, renal, cbc, tsh, hepatic  Hypertension As your heart beats, it forces blood through your arteries. This force is your blood pressure. If the pressure is too high, it is called hypertension (HTN) or high blood pressure. HTN is dangerous because you may have it and not know it. High blood pressure may mean that your heart has to work harder to pump blood. Your arteries may be narrow or stiff. The extra work puts you at risk for heart disease, stroke, and other problems.  Blood pressure consists of two numbers, a higher number over a lower, 110/72, for example. It is stated as "110 over 72." The ideal is below 120 for the top number (systolic) and under 80 for the bottom (diastolic). Write down your blood pressure today. You should pay close attention to your blood pressure if you have certain conditions such as:  Heart failure.  Prior heart attack.  Diabetes  Chronic kidney disease.  Prior stroke.  Multiple risk factors for heart disease. To see if you have HTN, your blood pressure should be measured while you are seated with your arm held at the level of the heart. It should be measured at least twice. A one-time elevated blood pressure reading (especially in the Emergency Department) does not mean that you need treatment. There may be conditions in which the blood pressure is different between your right and left arms. It is important to see your caregiver soon for a recheck. Most people have essential hypertension which means that there is not a specific cause. This type of high blood pressure may be lowered by changing lifestyle factors such as:  Stress.  Smoking.  Lack of exercise.  Excessive weight.  Drug/tobacco/alcohol use.  Eating less salt. Most people do not have symptoms from high blood pressure until it has caused damage to the body. Effective treatment can often prevent, delay or reduce that damage. TREATMENT  When a cause has been identified,  treatment for high blood pressure is directed at the cause. There are a large number of medications to treat HTN. These fall into several categories, and your caregiver will help you select the medicines that are best for you. Medications may have side effects. You should review side effects with your caregiver. If your blood pressure stays high after you have made lifestyle changes or started on medicines,   Your medication(s) may need to be changed.  Other problems may need to be addressed.  Be certain you understand your prescriptions, and know how and when to take your medicine.  Be sure to follow up with your caregiver within the time frame advised (usually within two weeks) to have your blood pressure rechecked and to review your medications.  If you are taking more than one medicine to lower your blood pressure, make sure you know how and at what times they should be taken. Taking two medicines at the same time can result in blood pressure that is too low. SEEK IMMEDIATE MEDICAL CARE IF:  You develop a severe headache, blurred or changing vision, or confusion.  You have unusual weakness or numbness, or a faint feeling.  You have severe chest or abdominal pain, vomiting, or breathing problems. MAKE SURE YOU:   Understand these instructions.  Will watch your condition.  Will get help right away if you are not doing well or get worse. Document Released: 09/21/2005 Document Revised: 12/14/2011 Document Reviewed: 05/11/2008 [facility] Patient Information 2013 [place], [place].

## 2013-02-06 NOTE — Telephone Encounter (Signed)
RX's filled while pt was in the office today

## 2013-02-07 ENCOUNTER — Encounter: Payer: Self-pay | Admitting: *Deleted

## 2013-02-07 LAB — URINALYSIS
Hgb urine dipstick: NEGATIVE
Ketones, ur: NEGATIVE mg/dL
Nitrite: NEGATIVE
Urobilinogen, UA: 0.2 mg/dL (ref 0.0–1.0)

## 2013-02-08 ENCOUNTER — Encounter: Payer: Self-pay | Admitting: Family Medicine

## 2013-02-08 DIAGNOSIS — R351 Nocturia: Secondary | ICD-10-CM

## 2013-02-08 HISTORY — DX: Nocturia: R35.1

## 2013-02-08 NOTE — Assessment & Plan Note (Signed)
Urinalysis normal. Likely related to pelvic floor dysfunction has had a sling in past which was helpful initially, may need to return to urology for further evaluation.

## 2013-02-08 NOTE — Progress Notes (Signed)
Patient ID: Amber Morgan, female   DOB: 08-08-1927, 79 y.o.   MRN: [id number] IDALY VERRET [id number] 31-May-1927 02/08/2013      Progress Note-Follow Up  Subjective  Chief Complaint  Chief Complaint  Patient presents with  . Follow-up    3 month    HPI  Patient is an 79 year old Caucasian female who is in today for followup. Overall she's doing well. She struggles with constipation and urinary frequency. Frequency is most notable at night and less obvious during the day. No dysuria. No abdominal pain. No bloody or tarry stool. She had a bladder sling and a rectocele repair several years ago and notes her symptoms are recently worsening once again. No chest pain, palpitations, shortness of breath, fevers or chills noted at this time  Past Medical History  Diagnosis Date  . History of chicken pox     childhood  . Diverticulitis   . HTN (hypertension)   . Colon polyps   . Urinary incontinence   . Unspecified constipation 02/06/2013  . Nocturia 02/08/2013    Past Surgical History  Procedure Laterality Date  . Eye surgery      multiple bilateral  . Bladder surgery  2003    bladder tact  . Abdominal hysterectomy      Family History  Problem Relation Age of Onset  . Colon cancer Mother   . Hypertension Mother   . Heart disease Father     History   Social History  . Marital Status: Married    Spouse Name: N/A    Number of Children: N/A  . Years of Education: N/A   Occupational History  . Not on file.   Social History Main Topics  . Smoking status: Never Smoker   . Smokeless tobacco: Never Used  . Alcohol Use: No  . Drug Use: Not on file  . Sexually Active: Not on file   Other Topics Concern  . Not on file   Social History Narrative  . No narrative on file    Current Outpatient Prescriptions on File Prior to Visit  Medication Sig Dispense Refill  . ketorolac (ACULAR) 0.5 % ophthalmic solution Place 1 drop into the right eye 2 (two) times daily.      .  Multiple Vitamin (MULTIVITAMIN) tablet Take 1 tablet by mouth daily.        Marland Kitchen Propylene Glycol (SYSTANE BALANCE OP) Apply 1 drop to eye daily as needed.      . timolol (TIMOPTIC) 0.25 % ophthalmic solution Place 1 drop into the left eye 2 (two) times daily.        No current facility-administered medications on file prior to visit.    No Known Allergies  Review of Systems  Review of Systems  Constitutional: Negative for fever and malaise/fatigue.  HENT: Negative for congestion.   Eyes: Negative for discharge.  Respiratory: Negative for shortness of breath.   Cardiovascular: Negative for chest pain, palpitations and leg swelling.  Gastrointestinal: Positive for constipation. Negative for nausea, abdominal pain and diarrhea.  Genitourinary: Positive for frequency. Negative for dysuria.  Musculoskeletal: Negative for falls.  Skin: Negative for rash.  Neurological: Negative for loss of consciousness and headaches.  Endo/Heme/Allergies: Negative for polydipsia.  Psychiatric/Behavioral: Negative for depression and suicidal ideas. The patient is not nervous/anxious and does not have insomnia.     Objective  BP 138/78  Pulse 54  Temp(Src) 97.8 F (36.6 C) (Oral)  Ht 4' 11.5" (1.511 m)  Wt 127 lb 1.3  oz (57.643 kg)  BMI 25.25 kg/m2  SpO2 96%  Physical Exam  Physical Exam  Constitutional: She is oriented to person, place, and time and well-developed, well-nourished, and in no distress. No distress.  HENT:  Head: Normocephalic and atraumatic.  Eyes: Conjunctivae are normal.  Neck: Neck supple. No thyromegaly present.  Cardiovascular: Normal rate, regular rhythm and normal heart sounds.   No murmur heard. Pulmonary/Chest: Effort normal and breath sounds normal. She has no wheezes.  Abdominal: She exhibits no distension and no mass.  Musculoskeletal: She exhibits no edema.  Lymphadenopathy:    She has no cervical adenopathy.  Neurological: She is alert and oriented to person,  place, and time.  Skin: Skin is warm and dry. No rash noted. She is not diaphoretic.  Psychiatric: Memory, affect and judgment normal.    No results found for this basename: TSH   Lab Results  Component Value Date   WBC 7.9 02/06/2013   HGB 13.1 02/06/2013   HCT 38.2 02/06/2013   MCV 89.9 02/06/2013   PLT 257 02/06/2013   Lab Results  Component Value Date   CREATININE 0.79 10/31/2012   BUN 15 10/31/2012   NA 140 10/31/2012   K 4.2 10/31/2012   CL 101 10/31/2012   CO2 33* 10/31/2012   Lab Results  Component Value Date   ALT 17 04/18/2012   AST 20 04/18/2012   ALKPHOS 59 04/18/2012   BILITOT 0.7 04/18/2012   Lab Results  Component Value Date   CHOL 190 04/18/2012   Lab Results  Component Value Date   HDL 51 04/18/2012   Lab Results  Component Value Date   LDLCALC 122* 04/18/2012   Lab Results  Component Value Date   TRIG 85 04/18/2012   Lab Results  Component Value Date   CHOLHDL 3.7 04/18/2012     Assessment & Plan  Nocturia Urinalysis normal. Likely related to pelvic floor dysfunction has had a sling in past which was helpful initially, may need to return to urology for further evaluation.  Unspecified constipation Encouraged probiotics and fiber, increase fluids  Hyperlipidemia Avoid trans fats, consider krill oil caps. Increase activity  HTN (hypertension) Well controlled no changes.

## 2013-02-08 NOTE — Assessment & Plan Note (Signed)
Encouraged probiotics and fiber, increase fluids

## 2013-02-08 NOTE — Assessment & Plan Note (Signed)
Well controlled no changes 

## 2013-02-08 NOTE — Assessment & Plan Note (Signed)
Avoid trans fats, consider krill oil caps. Increase activity

## 2013-03-06 ENCOUNTER — Ambulatory Visit: Payer: Medicare Other | Admitting: Family

## 2013-05-08 ENCOUNTER — Telehealth: Payer: Self-pay

## 2013-05-08 DIAGNOSIS — I10 Essential (primary) hypertension: Secondary | ICD-10-CM

## 2013-05-08 DIAGNOSIS — R202 Paresthesia of skin: Secondary | ICD-10-CM

## 2013-05-08 DIAGNOSIS — E785 Hyperlipidemia, unspecified: Secondary | ICD-10-CM

## 2013-05-08 LAB — RENAL FUNCTION PANEL
BUN: 18 mg/dL (ref 6–23)
CO2: 30 mEq/L (ref 19–32)
Chloride: 100 mEq/L (ref 96–112)
Glucose, Bld: 99 mg/dL (ref 70–99)
Phosphorus: 4.2 mg/dL (ref 2.3–4.6)
Potassium: 4.4 mEq/L (ref 3.5–5.3)

## 2013-05-08 LAB — CBC
HCT: 37.9 % (ref 36.0–46.0)
Hemoglobin: 13.2 g/dL (ref 12.0–15.0)
MCH: 31.4 pg (ref 26.0–34.0)
MCHC: 34.8 g/dL (ref 30.0–36.0)

## 2013-05-08 LAB — LIPID PANEL: Cholesterol: 182 mg/dL (ref 0–200)

## 2013-05-08 LAB — HEPATIC FUNCTION PANEL
AST: 19 U/L (ref 0–37)
Albumin: 4 g/dL (ref 3.5–5.2)
Alkaline Phosphatase: 56 U/L (ref 39–117)
Total Bilirubin: 0.7 mg/dL (ref 0.3–1.2)

## 2013-05-08 NOTE — Telephone Encounter (Signed)
Lab order placed.

## 2013-05-11 ENCOUNTER — Ambulatory Visit (INDEPENDENT_AMBULATORY_CARE_PROVIDER_SITE_OTHER): Payer: Medicare Other | Admitting: Family Medicine

## 2013-05-11 ENCOUNTER — Encounter: Payer: Self-pay | Admitting: Family Medicine

## 2013-05-11 VITALS — BP 126/60 | HR 53 | Temp 97.5°F | Ht 59.5 in | Wt 129.0 lb

## 2013-05-11 DIAGNOSIS — B088 Other specified viral infections characterized by skin and mucous membrane lesions: Secondary | ICD-10-CM

## 2013-05-11 DIAGNOSIS — B09 Unspecified viral infection characterized by skin and mucous membrane lesions: Secondary | ICD-10-CM

## 2013-05-11 DIAGNOSIS — H409 Unspecified glaucoma: Secondary | ICD-10-CM

## 2013-05-11 DIAGNOSIS — I10 Essential (primary) hypertension: Secondary | ICD-10-CM

## 2013-05-11 DIAGNOSIS — L578 Other skin changes due to chronic exposure to nonionizing radiation: Secondary | ICD-10-CM

## 2013-05-11 DIAGNOSIS — E785 Hyperlipidemia, unspecified: Secondary | ICD-10-CM

## 2013-05-11 DIAGNOSIS — K5900 Constipation, unspecified: Secondary | ICD-10-CM

## 2013-05-11 HISTORY — DX: Unspecified viral infection characterized by skin and mucous membrane lesions: B09

## 2013-05-11 NOTE — Assessment & Plan Note (Addendum)
Witch Hazel Astringent to clean area and consider Acyclovir to see if helps, may call for rx  HSV vs Varicella

## 2013-05-11 NOTE — Assessment & Plan Note (Addendum)
Moving bowels more regularly but encouraged probiotic and fiber

## 2013-05-11 NOTE — Patient Instructions (Addendum)
Start a krill oil caps daily such as MegaRed by [employer]  Consider soaking thumb in distilled white vinegar and 1/2 warm water each night and applying vick's vapor rub  Probiotic daily   Cholesterol Cholesterol is a white, waxy, fat-like protein needed by your body in small amounts. The liver makes all the cholesterol you need. It is carried from the liver by the blood through the blood vessels. Deposits (plaque) may build up on blood vessel walls. This makes the arteries narrower and stiffer. Plaque increases the risk for heart attack and stroke. You cannot feel your cholesterol level even if it is very high. The only way to know is by a blood test to check your lipid (fats) levels. Once you know your cholesterol levels, you should keep a record of the test results. Work with your caregiver to to keep your levels in the desired range. WHAT THE RESULTS MEAN:  Total cholesterol is a rough measure of all the cholesterol in your blood.  LDL is the so-called bad cholesterol. This is the type that deposits cholesterol in the walls of the arteries. You want this level to be low.  HDL is the good cholesterol because it cleans the arteries and carries the LDL away. You want this level to be high.  Triglycerides are fat that the body can either burn for energy or store. High levels are closely linked to heart disease. DESIRED LEVELS:  Total cholesterol below 200.  LDL below 100 for people at risk, below 70 for very high risk.  HDL above 50 is good, above 60 is best.  Triglycerides below 150. HOW TO LOWER YOUR CHOLESTEROL:  Diet.  Choose fish or white meat chicken and [place], roasted or baked. Limit fatty cuts of red meat, fried foods, and processed meats, such as sausage and lunch meat.  Eat lots of fresh fruits and vegetables. Choose whole grains, beans, pasta, potatoes and cereals.  Use only small amounts of olive, corn or canola oils. Avoid butter, mayonnaise, shortening or palm kernel  oils. Avoid foods with trans-fats.  Use skim/nonfat milk and low-fat/nonfat yogurt and cheeses. Avoid whole milk, cream, ice cream, egg yolks and cheeses. Healthy desserts include angel food cake, ginger snaps, animal crackers, hard candy, popsicles, and low-fat/nonfat frozen yogurt. Avoid pastries, cakes, pies and cookies.  Exercise.  A regular program helps decrease LDL and raises HDL.  Helps with weight control.  Do things that increase your activity level like gardening, walking, or taking the stairs.  Medication.  May be prescribed by your caregiver to help lowering cholesterol and the risk for heart disease.  You may need medicine even if your levels are normal if you have several risk factors. HOME CARE INSTRUCTIONS   Follow your diet and exercise programs as suggested by your caregiver.  Take medications as directed.  Have blood work done when your caregiver feels it is necessary. MAKE SURE YOU:   Understand these instructions.  Will watch your condition.  Will get help right away if you are not doing well or get worse. Document Released: 06/16/2001 Document Revised: 12/14/2011 Document Reviewed: 12/07/2007 [facility] Patient Information 2014 [place], [place].

## 2013-05-14 NOTE — Assessment & Plan Note (Signed)
Mild, avoid transplants add Krill oil capsule.

## 2013-05-14 NOTE — Progress Notes (Signed)
Patient ID: Amber Morgan, female   DOB: [birth year], 79 y.o.   MRN: [id number] Amber Morgan [id number] 28-Apr-1927 05/14/2013      Progress Note-Follow Up  Subjective  Chief Complaint  Chief Complaint  Patient presents with  . Annual Exam    physical    HPI  Patient is an 79 year old Caucasian female who is in today for routine medical care. She feels well. She's not had any recent illness. No fevers or headaches. No chest pain, palpitations, shortness of breath, GI or GU concerns. She takes her medications as prescribed. Her husbands health is improved and does she is feeling better.  Past Medical History  Diagnosis Date  . History of chicken pox     childhood  . Diverticulitis   . HTN (hypertension)   . Colon polyps   . Urinary incontinence   . Unspecified constipation 02/06/2013  . Nocturia 02/08/2013  . Viral infection characterized by skin and mucous membrane lesions 05/11/2013    Past Surgical History  Procedure Laterality Date  . Eye surgery      multiple bilateral  . Bladder surgery  2003    bladder tact  . Abdominal hysterectomy      Family History  Problem Relation Age of Onset  . Colon cancer Mother   . Hypertension Mother   . Heart disease Father   . Meniere's disease Father   . Heart disease Sister   . Arthritis Son     back  . Glaucoma Son   . Hypertension Son   . Cataracts Son   . Colon cancer Sister   . Alzheimer's disease Sister   . Glaucoma Sister   . Glaucoma Sister   . Hyperlipidemia Sister   . Hypertension Sister   . Cancer Sister     thyroid  . Diabetes Sister   . Glaucoma Sister     History   Social History  . Marital Status: Married    Spouse Name: N/A    Number of Children: N/A  . Years of Education: N/A   Occupational History  . Not on file.   Social History Main Topics  . Smoking status: Never Smoker   . Smokeless tobacco: Never Used  . Alcohol Use: No  . Drug Use: Not on file  . Sexually Active: Not on file    Other Topics Concern  . Not on file   Social History Narrative  . No narrative on file    Current Outpatient Prescriptions on File Prior to Visit  Medication Sig Dispense Refill  . amLODipine (NORVASC) 5 MG tablet Take 1 tablet (5 mg total) by mouth daily.  90 tablet  2  . atenolol (TENORMIN) 25 MG tablet Take 1 tablet (25 mg total) by mouth daily.  90 tablet  2  . hydrochlorothiazide (HYDRODIURIL) 25 MG tablet Take 1 tablet (25 mg total) by mouth daily.  90 tablet  2  . ketorolac (ACULAR) 0.5 % ophthalmic solution Place 1 drop into the right eye 2 (two) times daily.      . Multiple Vitamin (MULTIVITAMIN) tablet Take 1 tablet by mouth daily.        Marland Kitchen Propylene Glycol (SYSTANE BALANCE OP) Apply 1 drop to eye daily as needed.       No current facility-administered medications on file prior to visit.    No Known Allergies  Review of Systems  Review of Systems  Constitutional: Negative for fever and malaise/fatigue.  HENT: Negative for congestion.  Eyes: Negative for discharge.  Respiratory: Negative for shortness of breath.   Cardiovascular: Negative for chest pain, palpitations and leg swelling.  Gastrointestinal: Negative for nausea, abdominal pain and diarrhea.  Genitourinary: Negative for dysuria.  Musculoskeletal: Negative for falls.  Skin: Negative for rash.  Neurological: Negative for loss of consciousness and headaches.  Endo/Heme/Allergies: Negative for polydipsia.  Psychiatric/Behavioral: Negative for depression and suicidal ideas. The patient is not nervous/anxious and does not have insomnia.     Objective  BP 126/60  Pulse 53  Temp(Src) 97.5 F (36.4 C) (Oral)  Ht 4' 11.5" (1.511 m)  Wt 129 lb (58.514 kg)  BMI 25.63 kg/m2  SpO2 97%  Physical Exam  Physical Exam  Constitutional: She is oriented to person, place, and time and well-developed, well-nourished, and in no distress. No distress.  HENT:  Head: Normocephalic and atraumatic.  Right Ear:  External ear normal.  Left Ear: External ear normal.  Nose: Nose normal.  Mouth/Throat: Oropharynx is clear and moist. No oropharyngeal exudate.  Eyes: Conjunctivae are normal. Pupils are equal, round, and reactive to light. Right eye exhibits no discharge. Left eye exhibits no discharge. No scleral icterus.  Neck: Normal range of motion. Neck supple. No thyromegaly present.  Cardiovascular: Normal rate, regular rhythm, normal heart sounds and intact distal pulses.   No murmur heard. Pulmonary/Chest: Effort normal and breath sounds normal. No respiratory distress. She has no wheezes. She has no rales.  Abdominal: Soft. Bowel sounds are normal. She exhibits no distension and no mass. There is no tenderness.  Musculoskeletal: Normal range of motion. She exhibits no edema and no tenderness.  Lymphadenopathy:    She has no cervical adenopathy.  Neurological: She is alert and oriented to person, place, and time. She has normal reflexes. No cranial nerve deficit. Coordination normal.  Skin: Skin is warm and dry. No rash noted. She is not diaphoretic.  Psychiatric: Mood, memory and affect normal.    Lab Results  Component Value Date   TSH 3.308 05/08/2013   Lab Results  Component Value Date   WBC 5.9 05/08/2013   HGB 13.2 05/08/2013   HCT 37.9 05/08/2013   MCV 90.0 05/08/2013   PLT 255 05/08/2013   Lab Results  Component Value Date   CREATININE 0.93 05/08/2013   BUN 18 05/08/2013   NA 139 05/08/2013   K 4.4 05/08/2013   CL 100 05/08/2013   CO2 30 05/08/2013   Lab Results  Component Value Date   ALT 17 05/08/2013   AST 19 05/08/2013   ALKPHOS 56 05/08/2013   BILITOT 0.7 05/08/2013   Lab Results  Component Value Date   CHOL 182 05/08/2013   Lab Results  Component Value Date   HDL 58 05/08/2013   Lab Results  Component Value Date   LDLCALC 108* 05/08/2013   Lab Results  Component Value Date   TRIG 81 05/08/2013   Lab Results  Component Value Date   CHOLHDL 3.1 05/08/2013     Assessment &  Plan  Viral infection characterized by skin and mucous membrane lesions Witch Hazel Astringent to clean area and consider Acyclovir to see if helps, may call for rx  HSV vs Varicella  Unspecified constipation Moving bowels more regularly but encouraged probiotic and fiber  HTN (hypertension) Well controlled no changes  Hyperlipidemia Mild, avoid transplants add Krill oil capsule.  Glaucoma No recent changs

## 2013-05-14 NOTE — Assessment & Plan Note (Signed)
Well controlled no changes 

## 2013-05-14 NOTE — Assessment & Plan Note (Signed)
No recent changs

## 2013-05-21 ENCOUNTER — Encounter (HOSPITAL_BASED_OUTPATIENT_CLINIC_OR_DEPARTMENT_OTHER): Payer: Self-pay | Admitting: *Deleted

## 2013-05-21 ENCOUNTER — Emergency Department (HOSPITAL_BASED_OUTPATIENT_CLINIC_OR_DEPARTMENT_OTHER)
Admission: EM | Admit: 2013-05-21 | Discharge: 2013-05-21 | Disposition: A | Discharge status: Home / Self Care | Payer: Medicare Other | Attending: Emergency Medicine | Admitting: Emergency Medicine

## 2013-05-21 DIAGNOSIS — Y929 Unspecified place or not applicable: Secondary | ICD-10-CM | POA: Insufficient documentation

## 2013-05-21 DIAGNOSIS — W540XXA Bitten by dog, initial encounter: Secondary | ICD-10-CM | POA: Insufficient documentation

## 2013-05-21 DIAGNOSIS — Z8719 Personal history of other diseases of the digestive system: Secondary | ICD-10-CM | POA: Insufficient documentation

## 2013-05-21 DIAGNOSIS — I10 Essential (primary) hypertension: Secondary | ICD-10-CM | POA: Insufficient documentation

## 2013-05-21 DIAGNOSIS — Z23 Encounter for immunization: Secondary | ICD-10-CM | POA: Insufficient documentation

## 2013-05-21 DIAGNOSIS — Z860100 Personal history of colon polyps, unspecified: Secondary | ICD-10-CM | POA: Insufficient documentation

## 2013-05-21 DIAGNOSIS — L03119 Cellulitis of unspecified part of limb: Secondary | ICD-10-CM

## 2013-05-21 DIAGNOSIS — Z79899 Other long term (current) drug therapy: Secondary | ICD-10-CM | POA: Insufficient documentation

## 2013-05-21 DIAGNOSIS — Z8619 Personal history of other infectious and parasitic diseases: Secondary | ICD-10-CM | POA: Insufficient documentation

## 2013-05-21 DIAGNOSIS — S61451A Open bite of right hand, initial encounter: Secondary | ICD-10-CM

## 2013-05-21 DIAGNOSIS — Y939 Activity, unspecified: Secondary | ICD-10-CM | POA: Insufficient documentation

## 2013-05-21 DIAGNOSIS — L02519 Cutaneous abscess of unspecified hand: Secondary | ICD-10-CM | POA: Insufficient documentation

## 2013-05-21 DIAGNOSIS — S61409A Unspecified open wound of unspecified hand, initial encounter: Secondary | ICD-10-CM | POA: Insufficient documentation

## 2013-05-21 LAB — BASIC METABOLIC PANEL
BUN: 15 mg/dL (ref 6–23)
CO2: 30 mEq/L (ref 19–32)
Calcium: 10.3 mg/dL (ref 8.4–10.5)
Creatinine, Ser: 0.8 mg/dL (ref 0.50–1.10)
GFR calc Af Amer: 77 mL/min — ABNORMAL LOW (ref >90)
GFR calc non Af Amer: 66 mL/min — ABNORMAL LOW (ref >90)
Potassium: 3 mEq/L — ABNORMAL LOW (ref 3.5–5.1)
Sodium: 139 mEq/L (ref 135–145)

## 2013-05-21 LAB — CBC
HCT: 38 % (ref 36.0–46.0)
Hemoglobin: 13.2 g/dL (ref 12.0–15.0)
MCH: 32.4 pg (ref 26.0–34.0)
RBC: 4.07 MIL/uL (ref 3.87–5.11)

## 2013-05-21 MED ORDER — AMOXICILLIN-POT CLAVULANATE 875-125 MG PO TABS: 1.0000 | ORAL_TABLET | Freq: Two times a day (BID) | ORAL | Status: DC

## 2013-05-21 MED ORDER — SODIUM CHLORIDE 0.9 % IV SOLN
3.0000 g | Freq: Once | INTRAVENOUS | Status: AC
  Administered 2013-05-21: 3 g via INTRAVENOUS
  Filled 2013-05-21: qty 3

## 2013-05-21 MED ORDER — NAPROXEN SODIUM 275 MG PO TABS: 275.0000 mg | ORAL_TABLET | Freq: Two times a day (BID) | ORAL | Status: DC

## 2013-05-21 MED ORDER — TETANUS-DIPHTH-ACELL PERTUSSIS 5-2.5-18.5 LF-MCG/0.5 IM SUSP
0.5000 mL | Freq: Once | INTRAMUSCULAR | Status: AC
  Administered 2013-05-21: 0.5 mL via INTRAMUSCULAR
  Filled 2013-05-21: qty 0.5

## 2013-05-21 MED ORDER — POTASSIUM CHLORIDE CRYS ER 20 MEQ PO TBCR
40.0000 meq | EXTENDED_RELEASE_TABLET | Freq: Once | ORAL | Status: AC
  Administered 2013-05-21: 40 meq via ORAL
  Filled 2013-05-21: qty 2

## 2013-05-21 NOTE — Consult Note (Signed)
ORTHOPAEDIC CONSULTATION HISTORY & PHYSICAL REQUESTING PHYSICIAN: Ethelda Chick, MD  Chief Complaint: R hand dog bite  HPI: Amber Morgan is a 79 y.o. female who complains of right hand pain and swelling following a dog bite that occurred on Friday night.    Past Medical History  Diagnosis Date  . History of chicken pox     childhood  . Diverticulitis   . HTN (hypertension)   . Colon polyps   . Urinary incontinence   . Unspecified constipation 02/06/2013  . Nocturia 02/08/2013  . Viral infection characterized by skin and mucous membrane lesions 05/11/2013   Past Surgical History  Procedure Laterality Date  . Eye surgery      multiple bilateral  . Bladder surgery  2003    bladder tact  . Abdominal hysterectomy     History   Social History  . Marital Status: Married    Spouse Name: N/A    Number of Children: N/A  . Years of Education: N/A   Social History Main Topics  . Smoking status: Never Smoker   . Smokeless tobacco: Never Used  . Alcohol Use: No  . Drug Use: None  . Sexual Activity: None   Other Topics Concern  . None   Social History Narrative  . None   Family History  Problem Relation Age of Onset  . Colon cancer Mother   . Hypertension Mother   . Heart disease Father   . Meniere's disease Father   . Heart disease Sister   . Arthritis Son     back  . Glaucoma Son   . Hypertension Son   . Cataracts Son   . Colon cancer Sister   . Alzheimer's disease Sister   . Glaucoma Sister   . Glaucoma Sister   . Hyperlipidemia Sister   . Hypertension Sister   . Cancer Sister     thyroid  . Diabetes Sister   . Glaucoma Sister    No Known Allergies Prior to Admission medications   Medication Sig Start Date End Date Taking? Authorizing Provider  amLODipine (NORVASC) 5 MG tablet Take 1 tablet (5 mg total) by mouth daily. 02/06/13   Bradd Canary, MD  atenolol (TENORMIN) 25 MG tablet Take 1 tablet (25 mg total) by mouth daily. 02/06/13   Bradd Canary, MD   hydrochlorothiazide (HYDRODIURIL) 25 MG tablet Take 1 tablet (25 mg total) by mouth daily. 02/06/13   Bradd Canary, MD  ketorolac (ACULAR) 0.5 % ophthalmic solution Place 1 drop into the right eye 2 (two) times daily.    Historical Provider, MD  Multiple Vitamin (MULTIVITAMIN) tablet Take 1 tablet by mouth daily.      Historical Provider, MD  Propylene Glycol (SYSTANE BALANCE OP) Apply 1 drop to eye daily as needed.    Historical Provider, MD  timolol (BETIMOL) 0.5 % ophthalmic solution Place 1 drop into the left eye 2 (two) times daily.    Historical Provider, MD   No results found.  Positive ROS: All other systems have been reviewed and were otherwise negative with the exception of those mentioned in the HPI and as above.  Physical Exam: Vitals: Refer to EMR. Constitutional:  WD, WN, NAD HEENT:  NCAT, EOMI Neuro/Psych:  Alert & oriented to person, place, and time; appropriate mood & affect Lymphatic: No generalized UE edema or lymphadenopathy Extremities / MSK:  The extremities are normal with respect to appearance, ranges of motion, joint stability, muscle strength/tone, sensation, & perfusion  except as otherwise noted:   2 small puncture wounds/lacerations, one volar 1st distal web, 2nd on dorsum.  Great MPJ ROM without pain, full digital flexion and extension, including IF, limited just by tightness of edema.  Mild erythema dorsum of hand distal and radial.    Assessment: 2-day old dog bite  Plan: Oral antibiotics (augmentin), NSAIDS, and f/u in my office this week. Call if worsening.  Cliffton Asters Janee Morn, MD     [place] [phone] Orthopaedic & Hand Surgery [facility] Orthopaedic [facility] Medicine [facility] [address] [place], [place]  [address] [phone]

## 2013-05-21 NOTE — ED Notes (Signed)
Pt c/o dog bite to right hand x 3 days ago , pts own dog with updated shots

## 2013-05-21 NOTE — ED Notes (Signed)
Pt bit by own dog on Friday night, bite marks noted on right hand front side and back side, area with 3+ edema and redness, c/o some pain

## 2013-05-21 NOTE — ED Provider Notes (Signed)
CSN: [id number]     Arrival date & time 05/21/13  1019 History     First MD Initiated Contact with Patient 05/21/13 1200     Chief Complaint  Patient presents with  . Animal Bite   (Consider location/radiation/quality/duration/timing/severity/associated sxs/prior Treatment) Patient is a 79 y.o. female presenting with animal bite. The history is provided by the patient.  Animal Bite Associated symptoms: no fever, no numbness and no rash     79 year old female presents emergency department chief complaint of right hand pain.  Patient was bitten by her tach muscle tear he or 3 days ago on the right hand.  She states that 2 days ago she began having warmth, swelling, heat and tenderness which has progressively worsened.  She denies fevers, chills, myalgias, arthralgias abdominal pain nausea or vomiting.  She has full range of motion of the fingers good grip strength and denies severe pain.  The patient brings paperwork showing that her dog is up-to-date on all of his vaccinations.  Past Medical History  Diagnosis Date  . History of chicken pox     childhood  . Diverticulitis   . HTN (hypertension)   . Colon polyps   . Urinary incontinence   . Unspecified constipation 02/06/2013  . Nocturia 02/08/2013  . Viral infection characterized by skin and mucous membrane lesions 05/11/2013   Past Surgical History  Procedure Laterality Date  . Eye surgery      multiple bilateral  . Bladder surgery  2003    bladder tact  . Abdominal hysterectomy     Family History  Problem Relation Age of Onset  . Colon cancer Mother   . Hypertension Mother   . Heart disease Father   . Meniere's disease Father   . Heart disease Sister   . Arthritis Son     back  . Glaucoma Son   . Hypertension Son   . Cataracts Son   . Colon cancer Sister   . Alzheimer's disease Sister   . Glaucoma Sister   . Glaucoma Sister   . Hyperlipidemia Sister   . Hypertension Sister   . Cancer Sister     thyroid  . Diabetes  Sister   . Glaucoma Sister    History  Substance Use Topics  . Smoking status: Never Smoker   . Smokeless tobacco: Never Used  . Alcohol Use: No   OB History   Grav Para Term Preterm Abortions TAB SAB Ect Mult Living                 Review of Systems  Constitutional: Negative for fever and chills.  HENT: Negative for trouble swallowing.   Respiratory: Negative for shortness of breath.   Cardiovascular: Negative for chest pain.  Gastrointestinal: Negative for nausea, vomiting, abdominal pain, diarrhea and constipation.  Genitourinary: Negative for dysuria and hematuria.  Musculoskeletal: Positive for joint swelling. Negative for myalgias and arthralgias.  Skin: Positive for wound. Negative for rash.  Neurological: Negative for weakness and numbness.  All other systems reviewed and are negative.    Allergies  Review of patient's allergies indicates no known allergies.  Home Medications   Current Outpatient Rx  Name  Route  Sig  Dispense  Refill  . amLODipine (NORVASC) 5 MG tablet   Oral   Take 1 tablet (5 mg total) by mouth daily.   90 tablet   2   . atenolol (TENORMIN) 25 MG tablet   Oral   Take 1 tablet (25 mg total)  by mouth daily.   90 tablet   2   . hydrochlorothiazide (HYDRODIURIL) 25 MG tablet   Oral   Take 1 tablet (25 mg total) by mouth daily.   90 tablet   2   . ketorolac (ACULAR) 0.5 % ophthalmic solution   Right Eye   Place 1 drop into the right eye 2 (two) times daily.         . Multiple Vitamin (MULTIVITAMIN) tablet   Oral   Take 1 tablet by mouth daily.           Marland Kitchen Propylene Glycol (SYSTANE BALANCE OP)   Ophthalmic   Apply 1 drop to eye daily as needed.         . timolol (BETIMOL) 0.5 % ophthalmic solution   Left Eye   Place 1 drop into the left eye 2 (two) times daily.          BP 144/49  Pulse 73  Temp(Src) 98.4 F (36.9 C) (Oral)  Resp 16  Ht 4\' 11"  (1.499 m)  Wt 129 lb (58.514 kg)  BMI 26.04 kg/m2  SpO2  98% Physical Exam  Constitutional: She is oriented to person, place, and time. She appears well-developed and well-nourished. No distress.  HENT:  Head: Normocephalic and atraumatic.  Eyes: Conjunctivae are normal. No scleral icterus.  Neck: Normal range of motion.  Cardiovascular: Normal rate, regular rhythm and normal heart sounds.  Exam reveals no gallop and no friction rub.   No murmur heard. Pulmonary/Chest: Effort normal and breath sounds normal. No respiratory distress.  Abdominal: Soft. Bowel sounds are normal. She exhibits no distension and no mass. There is no tenderness. There is no guarding.  Musculoskeletal:  Right hand with heat, swelling and redness over the second and third fingers greatest amount of swelling is over the MCP joints.  3 puncture wounds present with evidence of serous discharge.  No crepitus.  Full strength.  Range of motion is limited due to swelling.  No loss of sensation.  Pulses intact.  No lymphangitis.  Neurological: She is alert and oriented to person, place, and time.  Skin: Skin is warm and dry. She is not diaphoretic.    ED Course   Procedures (including critical care time)  Labs Reviewed - No data to display No results found. No diagnosis found.  MDM  79 year old female with dog bite to the right hand, heat, swelling, tenderness.  No suspicion of septic joint.  Tissues are soft and abscess is questionable.  The patient is right-hand dominant.  No signs of lymphangitis or adenopathy.   2:17 PM BP 144/49  Pulse 73  Temp(Src) 98.4 F (36.9 C) (Oral)  Resp 16  Ht 4\' 11"  (1.499 m)  Wt 129 lb (58.514 kg)  BMI 26.04 kg/m2  SpO2 98% Patient seen in shared visit with Dr.Linker.  Dr. Janee Morn who is on call for had has seen and evaluated the patient as well. The patient will be d/c with augmentin and f/u with him at the office.  Hypokalemia  Tdap updated. IV unasyn here.       Arthor Captain, PA-C 05/27/13 1059

## 2013-05-29 NOTE — ED Provider Notes (Signed)
Medical screening examination/treatment/procedure(s) were conducted as a shared visit with non-physician practitioner(s) and myself.  I personally evaluated the patient during the encounter  Pt seen and examined. Pt with cellulitis of 1st/2nd fingers s/p dog bite.  Possible early abscesss.  Pt also with elevated WBC, pt started on unasyn.  Hand consult obtained. Dr. Janee Morn feels patient can be evaluated in the office and discharged on po abx.    Ethelda Chick, MD 05/29/13 [phone]

## 2013-06-22 ENCOUNTER — Ambulatory Visit (INDEPENDENT_AMBULATORY_CARE_PROVIDER_SITE_OTHER): Payer: Medicare Other

## 2013-06-22 DIAGNOSIS — Z23 Encounter for immunization: Secondary | ICD-10-CM

## 2013-09-05 ENCOUNTER — Encounter: Payer: Self-pay | Admitting: Family Medicine

## 2013-09-05 ENCOUNTER — Ambulatory Visit (INDEPENDENT_AMBULATORY_CARE_PROVIDER_SITE_OTHER): Payer: Medicare Other | Admitting: Family Medicine

## 2013-09-05 VITALS — BP 118/64 | HR 52 | Temp 97.8°F | Ht 59.5 in | Wt 128.1 lb

## 2013-09-05 DIAGNOSIS — R609 Edema, unspecified: Secondary | ICD-10-CM | POA: Insufficient documentation

## 2013-09-05 DIAGNOSIS — K5900 Constipation, unspecified: Secondary | ICD-10-CM

## 2013-09-05 DIAGNOSIS — Z860100 Personal history of colon polyps, unspecified: Secondary | ICD-10-CM

## 2013-09-05 DIAGNOSIS — I10 Essential (primary) hypertension: Secondary | ICD-10-CM

## 2013-09-05 DIAGNOSIS — E785 Hyperlipidemia, unspecified: Secondary | ICD-10-CM

## 2013-09-05 DIAGNOSIS — R739 Hyperglycemia, unspecified: Secondary | ICD-10-CM

## 2013-09-05 DIAGNOSIS — R7309 Other abnormal glucose: Secondary | ICD-10-CM

## 2013-09-05 HISTORY — DX: Personal history of colonic polyps: Z86.010

## 2013-09-05 HISTORY — DX: Hyperglycemia, unspecified: R73.9

## 2013-09-05 HISTORY — DX: Personal history of colon polyps, unspecified: Z86.0100

## 2013-09-05 LAB — LIPID PANEL
Cholesterol: 167 mg/dL (ref 0–200)
Triglycerides: 82 mg/dL (ref <150)
VLDL: 16 mg/dL (ref 0–40)

## 2013-09-05 LAB — HEMOGLOBIN A1C
Hgb A1c MFr Bld: 6.2 % — ABNORMAL HIGH (ref <5.7)
Mean Plasma Glucose: 131 mg/dL — ABNORMAL HIGH (ref <117)

## 2013-09-05 LAB — TSH: TSH: 2.52 u[IU]/mL (ref 0.350–4.500)

## 2013-09-05 LAB — CBC
HCT: 36.9 % (ref 36.0–46.0)
Hemoglobin: 12.7 g/dL (ref 12.0–15.0)
MCH: 31.1 pg (ref 26.0–34.0)
MCHC: 34.4 g/dL (ref 30.0–36.0)
RBC: 4.09 MIL/uL (ref 3.87–5.11)

## 2013-09-05 LAB — HEPATIC FUNCTION PANEL
Albumin: 3.9 g/dL (ref 3.5–5.2)
Alkaline Phosphatase: 56 U/L (ref 39–117)
Total Bilirubin: 0.6 mg/dL (ref 0.3–1.2)
Total Protein: 6.6 g/dL (ref 6.0–8.3)

## 2013-09-05 LAB — RENAL FUNCTION PANEL
CO2: 31 mEq/L (ref 19–32)
Chloride: 100 mEq/L (ref 96–112)
Phosphorus: 3.4 mg/dL (ref 2.3–4.6)
Potassium: 4.4 mEq/L (ref 3.5–5.3)
Sodium: 139 mEq/L (ref 135–145)

## 2013-09-05 NOTE — Progress Notes (Signed)
Patient ID: Amber Morgan, female   DOB: 1927/05/10, 79 y.o.   MRN: [id number] SORINA DERRIG [id number] March 28, 1927 09/05/2013      Progress Note-Follow Up  Subjective  Chief Complaint  Chief Complaint  Patient presents with  . Follow-up    HPI  Patient is an 79 year old Caucasian female who is in today for followup. Her constipation is slightly improved since adding Benefiber and probiotics but she still strains at times. She also notes some persistent urinary frequency but denies any polydipsia. No recent illness. No fevers or chills. She reports having Zostavax on 07/14/2011 and pneumonia shot on 12/17/2009. No chest pain, palpitations or shortness of breath. Taking medications as prescribed  Past Medical History  Diagnosis Date  . History of chicken pox     childhood  . Diverticulitis   . HTN (hypertension)   . Colon polyps   . Urinary incontinence   . Unspecified constipation 02/06/2013  . Nocturia 02/08/2013  . Viral infection characterized by skin and mucous membrane lesions 05/11/2013    Past Surgical History  Procedure Laterality Date  . Eye surgery      multiple bilateral  . Bladder surgery  2003    bladder tact  . Abdominal hysterectomy      Family History  Problem Relation Age of Onset  . Colon cancer Mother   . Hypertension Mother   . Heart disease Father   . Meniere's disease Father   . Heart disease Sister   . Arthritis Son     back  . Glaucoma Son   . Hypertension Son   . Cataracts Son   . Colon cancer Sister   . Alzheimer's disease Sister   . Glaucoma Sister   . Glaucoma Sister   . Hyperlipidemia Sister   . Hypertension Sister   . Cancer Sister     thyroid  . Diabetes Sister   . Glaucoma Sister     History   Social History  . Marital Status: Married    Spouse Name: N/A    Number of Children: N/A  . Years of Education: N/A   Occupational History  . Not on file.   Social History Main Topics  . Smoking status: Never Smoker   .  Smokeless tobacco: Never Used  . Alcohol Use: No  . Drug Use: Not on file  . Sexual Activity: Not on file   Other Topics Concern  . Not on file   Social History Narrative  . No narrative on file    Current Outpatient Prescriptions on File Prior to Visit  Medication Sig Dispense Refill  . amLODipine (NORVASC) 5 MG tablet Take 1 tablet (5 mg total) by mouth daily.  90 tablet  2  . atenolol (TENORMIN) 25 MG tablet Take 1 tablet (25 mg total) by mouth daily.  90 tablet  2  . hydrochlorothiazide (HYDRODIURIL) 25 MG tablet Take 1 tablet (25 mg total) by mouth daily.  90 tablet  2  . Multiple Vitamin (MULTIVITAMIN) tablet Take 1 tablet by mouth daily.        . naproxen sodium (ANAPROX) 275 MG tablet Take 1 tablet (275 mg total) by mouth 2 (two) times daily with a meal.  30 tablet  1  . Propylene Glycol (SYSTANE BALANCE OP) Apply 1 drop to eye daily as needed.      . timolol (BETIMOL) 0.5 % ophthalmic solution Place 1 drop into the left eye 2 (two) times daily.  No current facility-administered medications on file prior to visit.    No Known Allergies  Review of Systems  Review of Systems  Constitutional: Negative for fever and malaise/fatigue.  HENT: Negative for congestion.   Eyes: Negative for discharge.  Respiratory: Negative for shortness of breath.   Cardiovascular: Negative for chest pain, palpitations and leg swelling.  Gastrointestinal: Negative for nausea, abdominal pain and diarrhea.  Genitourinary: Negative for dysuria.  Musculoskeletal: Negative for falls.  Skin: Negative for rash.  Neurological: Negative for loss of consciousness and headaches.  Endo/Heme/Allergies: Negative for polydipsia.  Psychiatric/Behavioral: Negative for depression and suicidal ideas. The patient is not nervous/anxious and does not have insomnia.     Objective  BP 118/64  Pulse 52  Temp(Src) 97.8 F (36.6 C)  Ht 4' 11.5" (1.511 m)  Wt 128 lb 1.3 oz (58.097 kg)  BMI 25.45 kg/m2   SpO2 97%  Physical Exam  Physical Exam  Constitutional: She is oriented to person, place, and time and well-developed, well-nourished, and in no distress. No distress.  HENT:  Head: Normocephalic and atraumatic.  Eyes: Conjunctivae are normal.  Neck: Neck supple. No thyromegaly present.  Cardiovascular: Normal rate, regular rhythm and normal heart sounds.   No murmur heard. Pulmonary/Chest: Effort normal and breath sounds normal. She has no wheezes.  Abdominal: She exhibits no distension and no mass.  Musculoskeletal: She exhibits no edema.  Lymphadenopathy:    She has no cervical adenopathy.  Neurological: She is alert and oriented to person, place, and time.  Skin: Skin is warm and dry. No rash noted. She is not diaphoretic.  Psychiatric: Memory, affect and judgment normal.    Lab Results  Component Value Date   TSH 3.308 05/08/2013   Lab Results  Component Value Date   WBC 17.3* 05/21/2013   HGB 13.2 05/21/2013   HCT 38.0 05/21/2013   MCV 93.4 05/21/2013   PLT 193 05/21/2013   Lab Results  Component Value Date   CREATININE 0.80 05/21/2013   BUN 15 05/21/2013   NA 139 05/21/2013   K 3.0* 05/21/2013   CL 98 05/21/2013   CO2 30 05/21/2013   Lab Results  Component Value Date   ALT 17 05/08/2013   AST 19 05/08/2013   ALKPHOS 56 05/08/2013   BILITOT 0.7 05/08/2013   Lab Results  Component Value Date   CHOL 182 05/08/2013   Lab Results  Component Value Date   HDL 58 05/08/2013   Lab Results  Component Value Date   LDLCALC 108* 05/08/2013   Lab Results  Component Value Date   TRIG 81 05/08/2013   Lab Results  Component Value Date   CHOLHDL 3.1 05/08/2013     Assessment & Plan  HTN (hypertension) Well controlled, no changes.  Hyperlipidemia Improved. Continue fatty acid supplements, avoid trans fats.  Unspecified constipation Improved some with probiotics and Benefiber daily, increase to bid  Edema Encouraged DASH diet, elevate feet and try compression hose

## 2013-09-05 NOTE — Patient Instructions (Signed)
Probiotic daily a generic is fine  Constipation, Adult Constipation is when a person has fewer than 3 bowel movements a week; has difficulty having a bowel movement; or has stools that are dry, hard, or larger than normal. As people grow older, constipation is more common. If you try to fix constipation with medicines that make you have a bowel movement (laxatives), the problem may get worse. Long-term laxative use may cause the muscles of the colon to become weak. A low-fiber diet, not taking in enough fluids, and taking certain medicines may make constipation worse. CAUSES   Certain medicines, such as antidepressants, pain medicine, iron supplements, antacids, and water pills.   Certain diseases, such as diabetes, irritable bowel syndrome (IBS), thyroid disease, or depression.   Not drinking enough water.   Not eating enough fiber-rich foods.   Stress or travel.  Lack of physical activity or exercise.  Not going to the restroom when there is the urge to have a bowel movement.  Ignoring the urge to have a bowel movement.  Using laxatives too much. SYMPTOMS   Having fewer than 3 bowel movements a week.   Straining to have a bowel movement.   Having hard, dry, or larger than normal stools.   Feeling full or bloated.   Pain in the lower abdomen.  Not feeling relief after having a bowel movement. DIAGNOSIS  Your caregiver will take a medical history and perform a physical exam. Further testing may be done for severe constipation. Some tests may include:   A barium enema X-ray to examine your rectum, colon, and sometimes, your small intestine.  A sigmoidoscopy to examine your lower colon.  A colonoscopy to examine your entire colon. TREATMENT  Treatment will depend on the severity of your constipation and what is causing it. Some dietary treatments include drinking more fluids and eating more fiber-rich foods. Lifestyle treatments may include regular exercise. If  these diet and lifestyle recommendations do not help, your caregiver may recommend taking over-the-counter laxative medicines to help you have bowel movements. Prescription medicines may be prescribed if over-the-counter medicines do not work.  HOME CARE INSTRUCTIONS   Increase dietary fiber in your diet, such as fruits, vegetables, whole grains, and beans. Limit high-fat and processed sugars in your diet, such as [place] fries, hamburgers, cookies, candies, and soda.   A fiber supplement may be added to your diet if you cannot get enough fiber from foods.   Drink enough fluids to keep your urine clear or pale yellow.   Exercise regularly or as directed by your caregiver.   Go to the restroom when you have the urge to go. Do not hold it.  Only take medicines as directed by your caregiver. Do not take other medicines for constipation without talking to your caregiver first. SEEK IMMEDIATE MEDICAL CARE IF:   You have bright red blood in your stool.   Your constipation lasts for more than 4 days or gets worse.   You have abdominal or rectal pain.   You have thin, pencil-like stools.  You have unexplained weight loss. MAKE SURE YOU:   Understand these instructions.  Will watch your condition.  Will get help right away if you are not doing well or get worse. Document Released: 06/19/2004 Document Revised: 12/14/2011 Document Reviewed: 08/25/2011 [facility] Patient Information 2014 [place], [place].

## 2013-09-05 NOTE — Progress Notes (Signed)
Pre visit review using our clinic review tool, if applicable. No additional management support is needed unless otherwise documented below in the visit note. 

## 2013-09-06 NOTE — Assessment & Plan Note (Signed)
Improved. Continue fatty acid supplements, avoid trans fats.

## 2013-09-06 NOTE — Assessment & Plan Note (Signed)
Improved some with probiotics and Benefiber daily, increase to bid

## 2013-09-06 NOTE — Assessment & Plan Note (Signed)
Well controlled, no changes 

## 2013-09-06 NOTE — Assessment & Plan Note (Signed)
Encouraged DASH diet, elevate feet and try compression hose

## 2013-09-08 MED ORDER — POTASSIUM CHLORIDE CRYS ER 20 MEQ PO TBCR: 20.0000 meq | EXTENDED_RELEASE_TABLET | Freq: Every day | ORAL | Status: DC

## 2013-09-08 NOTE — Addendum Note (Signed)
Addended by: Court Joy on: 09/08/2013 06:02 PM   Modules accepted: Orders

## 2013-09-08 NOTE — Progress Notes (Signed)
Pt advised and RX sent

## 2013-09-13 LAB — RENAL FUNCTION PANEL
Albumin: 4 g/dL (ref 3.5–5.2)
BUN: 16 mg/dL (ref 6–23)
CO2: 31 mEq/L (ref 19–32)
Calcium: 10.1 mg/dL (ref 8.4–10.5)
Glucose, Bld: 91 mg/dL (ref 70–99)
Sodium: 136 mEq/L (ref 135–145)

## 2013-09-15 ENCOUNTER — Telehealth: Payer: Self-pay

## 2013-09-15 MED ORDER — FLUCONAZOLE 150 MG PO TABS: 150.0000 mg | ORAL_TABLET | ORAL | Status: DC

## 2013-09-15 NOTE — Telephone Encounter (Signed)
RX sent

## 2013-09-18 ENCOUNTER — Telehealth: Payer: Self-pay

## 2013-09-18 NOTE — Telephone Encounter (Signed)
Pt called and is asking why a yeast infection medication was called in for her?

## 2013-09-18 NOTE — Telephone Encounter (Signed)
Patient called in regarding this. She wants to make sure her chart is updated. Also, she states that she never receives results regarding her labs and is questioning why she never receives results?

## 2013-09-18 NOTE — Telephone Encounter (Signed)
So, per MD we believe this was accidentally put in the wrong chart. Please do not take medication.   i will notify patient

## 2013-09-18 NOTE — Telephone Encounter (Signed)
Please advise? I updated the chart to take off the Diflucan. Pt stated she came in on the 2nd and again on the 10th so she could get her renal panel checked again.    Pt informed that I would put a copy of her labs in the mail. I informed pt that sometimes when the information is supposed to go to someone else it gets "closed" instead of sent. I apologized several times for this.

## 2013-11-27 ENCOUNTER — Telehealth: Payer: Self-pay | Admitting: Family Medicine

## 2013-11-27 MED ORDER — HYDROCHLOROTHIAZIDE 25 MG PO TABS: 25.0000 mg | ORAL_TABLET | Freq: Every day | ORAL | Status: DC

## 2013-11-27 MED ORDER — ATENOLOL 25 MG PO TABS: 25.0000 mg | ORAL_TABLET | Freq: Every day | ORAL | Status: DC

## 2013-11-27 NOTE — Telephone Encounter (Signed)
Refill- atenolol  Refill- hydrocholorothiazide

## 2013-12-19 LAB — HM COLONOSCOPY

## 2014-01-04 ENCOUNTER — Ambulatory Visit (INDEPENDENT_AMBULATORY_CARE_PROVIDER_SITE_OTHER): Payer: Medicare Other | Admitting: Family Medicine

## 2014-01-04 ENCOUNTER — Encounter: Payer: Self-pay | Admitting: Family Medicine

## 2014-01-04 VITALS — BP 138/58 | HR 54 | Temp 97.8°F | Ht 59.5 in | Wt 130.5 lb

## 2014-01-04 DIAGNOSIS — K5900 Constipation, unspecified: Secondary | ICD-10-CM

## 2014-01-04 DIAGNOSIS — Z860100 Personal history of colon polyps, unspecified: Secondary | ICD-10-CM

## 2014-01-04 DIAGNOSIS — R739 Hyperglycemia, unspecified: Secondary | ICD-10-CM

## 2014-01-04 DIAGNOSIS — R351 Nocturia: Secondary | ICD-10-CM

## 2014-01-04 DIAGNOSIS — R7309 Other abnormal glucose: Secondary | ICD-10-CM

## 2014-01-04 DIAGNOSIS — K5790 Diverticulosis of intestine, part unspecified, without perforation or abscess without bleeding: Secondary | ICD-10-CM

## 2014-01-04 DIAGNOSIS — I10 Essential (primary) hypertension: Secondary | ICD-10-CM

## 2014-01-04 DIAGNOSIS — E785 Hyperlipidemia, unspecified: Secondary | ICD-10-CM

## 2014-01-04 DIAGNOSIS — K5730 Diverticulosis of large intestine without perforation or abscess without bleeding: Secondary | ICD-10-CM

## 2014-01-04 HISTORY — DX: Diverticulosis of intestine, part unspecified, without perforation or abscess without bleeding: K57.90

## 2014-01-04 NOTE — Assessment & Plan Note (Signed)
hgba1c acceptable, minimize simple carbs. Increase exercise as tolerated. Repeat A1C today

## 2014-01-04 NOTE — Progress Notes (Signed)
Pre visit review using our clinic review tool, if applicable. No additional management support is needed unless otherwise documented below in the visit note. 

## 2014-01-04 NOTE — Assessment & Plan Note (Signed)
Well controlled, no changes to meds. Encouraged heart healthy diet such as the DASH diet and exercise as tolerated.  °

## 2014-01-04 NOTE — Assessment & Plan Note (Signed)
Encouraged heart healthy diet, increase exercise, avoid trans fats, consider a krill oil cap daily 

## 2014-01-04 NOTE — Assessment & Plan Note (Signed)
Unchanged from baseline

## 2014-01-04 NOTE — Assessment & Plan Note (Signed)
Recent colonoscopy showed one polyp but at this point they are not recommending repeat colonoscopy for screening purposes.

## 2014-01-04 NOTE — Patient Instructions (Signed)
Encouraged increased hydration and fiber in diet. Daily probiotics. If bowels not moving can use MOM 2 tbls po in 4 oz of warm prune juice by mouth every 2-3 days. If no results then repeat in 4 hours with  Dulcolax suppository pr, may repeat again in 4 more hours as needed. Seek care if symptoms worsen. Consider daily Miralax and/or Dulcolax if symptoms persist.    Probiotics daily such as Digestive Advantage, Align, Culturelle, or a generic  Basic Carbohydrate Counting Basic carbohydrate counting is a way to plan meals. It is done by counting the amount of carbohydrate in foods. Foods that have carbohydrates are starches (grains, beans, starchy vegetables) and sweets. Eating carbohydrates increases blood glucose (sugar) levels. People with diabetes use carbohydrate counting to help keep their blood glucose at a normal level.  COUNTING CARBOHYDRATES IN FOODS The first step in counting carbohydrates is to learn how many carbohydrate servings you should have in every meal. A dietitian can plan this for you. After learning the amount of carbohydrates to include in your meal plan, you can start to choose the carbohydrate-containing foods you want to eat.  There are 2 ways to identify the amount of carbohydrates in the foods you eat.  Read the Nutrition Facts panel on food labels. You need 2 pieces of information from the Nutrition Facts panel to count carbohydrates this way:  Serving size.  Total carbohydrate (in grams). Decide how many servings you will be eating. If it is 1 serving, you will be eating the amount of carbohydrate listed on the panel. If you will be eating 2 servings, you will be eating double the amount of carbohydrate listed on the panel.   Learn serving sizes. A serving size of most carbohydrate-containing foods is about 15 grams (g). Listed below are single serving sizes of common carbohydrate-containing foods:  1 slice bread.   cup unsweetened, dry cereal.   cup hot  cereal.   cup rice.   cup mashed potatoes.   cup pasta.  1 cup fresh fruit.   cup canned fruit.  1 cup milk (whole, 2%, or skim).   cup starchy vegetables (peas, corn, or potatoes). Counting carbohydrates this way is similar to looking on the Nutrition Facts panel. Decide how many servings you will eat first. Multiply the number of servings you eat by 15 g. For example, if you have 2 cups of strawberries, you had 2 servings. That means you had 30 g of carbohydrate (2 servings x 15 g = 30 g). CALCULATING CARBOHYDRATES IN A MEAL Sample dinner  3 oz chicken breast.   cup brown rice.   cup corn.  1 cup fat-free milk.  1 cup strawberries with sugar-free whipped topping. Carbohydrate calculation First, identify the foods that contain carbohydrate:  Rice.  Corn.  Milk.  Strawberries. Calculate the number of servings eaten:  2 servings rice.  1 serving corn.  1 serving milk.  1 serving strawberries. Multiply the number of servings by 15 g:  2 servings rice x 15 g = 30 g.  1 serving corn x 15 g = 15 g.  1 serving milk x 15 g = 15 g.  1 serving strawberries x 15 g = 15 g. Add the amounts to find the total carbohydrates eaten: 30 g + 15 g + 15 g + 15 g = 75 g carbohydrate eaten at dinner. Document Released: 09/21/2005 Document Revised: 12/14/2011 Document Reviewed: 08/07/2011 [facility] Patient Information 2014 [place], [place].

## 2014-01-04 NOTE — Progress Notes (Signed)
Patient ID: Amber Morgan, female   DOB: 1927/02/23, 80 y.o.   MRN: [id number] Amber Morgan [id number] June 07, 1927 01/04/2014      Progress Note-Follow Up  Subjective  Chief Complaint  Chief Complaint  Patient presents with  . Follow-up    4 month    HPI  Patient is a 80 year old female in today for routine medical care. Doing well at this time. Has had an improvement in her constipation. He recently had a colonoscopy at [facility] gastroenterology. They found a polyp once again and diverticulosis I have not recommended she have any further screening colonoscopies. She denies any other acute complaints. No recent illness. Denies CP/palp/SOB/HA/congestion/fevers/GI or GU c/o. Taking meds as prescribed  Past Medical History  Diagnosis Date  . History of chicken pox     childhood  . Diverticulitis   . HTN (hypertension)   . Colon polyps   . Urinary incontinence   . Unspecified constipation 02/06/2013  . Nocturia 02/08/2013  . Viral infection characterized by skin and mucous membrane lesions 05/11/2013  . Personal history of colonic polyps 09/05/2013  . Hyperglycemia 09/05/2013  . Diverticulosis 01/04/2014    Past Surgical History  Procedure Laterality Date  . Eye surgery      multiple bilateral  . Bladder surgery  2003    bladder tact  . Abdominal hysterectomy      Family History  Problem Relation Age of Onset  . Colon cancer Mother   . Hypertension Mother   . Heart disease Father   . Meniere's disease Father   . Heart disease Sister   . Arthritis Son     back  . Glaucoma Son   . Hypertension Son   . Cataracts Son   . Colon cancer Sister   . Alzheimer's disease Sister   . Glaucoma Sister   . Glaucoma Sister   . Hyperlipidemia Sister   . Hypertension Sister   . Cancer Sister     thyroid  . Diabetes Sister   . Glaucoma Sister     History   Social History  . Marital Status: Married    Spouse Name: N/A    Number of Children: N/A  . Years of Education: N/A    Occupational History  . Not on file.   Social History Main Topics  . Smoking status: Never Smoker   . Smokeless tobacco: Never Used  . Alcohol Use: No  . Drug Use: Not on file  . Sexual Activity: Not on file   Other Topics Concern  . Not on file   Social History Narrative  . No narrative on file    Current Outpatient Prescriptions on File Prior to Visit  Medication Sig Dispense Refill  . atenolol (TENORMIN) 25 MG tablet Take 1 tablet (25 mg total) by mouth daily.  90 tablet  1  . hydrochlorothiazide (HYDRODIURIL) 25 MG tablet Take 1 tablet (25 mg total) by mouth daily.  90 tablet  1  . Multiple Vitamin (MULTIVITAMIN) tablet Take 1 tablet by mouth daily.        . Omega-3 Fatty Acids (FISH OIL PO) Take by mouth daily.      Marland Kitchen Propylene Glycol (SYSTANE BALANCE OP) Apply 1 drop to eye daily as needed.      . timolol (BETIMOL) 0.5 % ophthalmic solution Place 1 drop into the left eye 2 (two) times daily.      . Wheat Dextrin (BENEFIBER PO) Take by mouth.  No current facility-administered medications on file prior to visit.    No Known Allergies  Review of Systems  Review of Systems  Constitutional: Negative for fever and malaise/fatigue.  HENT: Negative for congestion.   Eyes: Negative for discharge.  Respiratory: Negative for shortness of breath.   Cardiovascular: Negative for chest pain, palpitations and leg swelling.  Gastrointestinal: Negative for nausea, abdominal pain and diarrhea.  Genitourinary: Negative for dysuria.  Musculoskeletal: Negative for falls.  Skin: Negative for rash.  Neurological: Negative for loss of consciousness and headaches.  Endo/Heme/Allergies: Negative for polydipsia.  Psychiatric/Behavioral: Negative for depression and suicidal ideas. The patient is not nervous/anxious and does not have insomnia.     Objective  BP 138/58  Pulse 54  Temp(Src) 97.8 F (36.6 C) (Oral)  Ht 4' 11.5" (1.511 m)  Wt 130 lb 8 oz (59.194 kg)  BMI 25.93  kg/m2  SpO2 95%  Physical Exam  Physical Exam  Constitutional: She is oriented to person, place, and time and well-developed, well-nourished, and in no distress. No distress.  HENT:  Head: Normocephalic and atraumatic.  Eyes: Conjunctivae are normal.  Neck: Neck supple. No thyromegaly present.  Cardiovascular: Normal rate, regular rhythm and normal heart sounds.   Pulmonary/Chest: Effort normal and breath sounds normal. She has no wheezes.  Abdominal: She exhibits no distension and no mass.  Musculoskeletal: She exhibits no edema.  Lymphadenopathy:    She has no cervical adenopathy.  Neurological: She is alert and oriented to person, place, and time.  Skin: Skin is warm and dry. No rash noted. She is not diaphoretic.  Psychiatric: Memory, affect and judgment normal.    Lab Results  Component Value Date   TSH 2.520 09/05/2013   Lab Results  Component Value Date   WBC 6.6 09/05/2013   HGB 12.7 09/05/2013   HCT 36.9 09/05/2013   MCV 90.2 09/05/2013   PLT 237 09/05/2013   Lab Results  Component Value Date   CREATININE 0.77 09/13/2013   BUN 16 09/13/2013   NA 136 09/13/2013   K 4.5 09/13/2013   CL 100 09/13/2013   CO2 31 09/13/2013   Lab Results  Component Value Date   ALT 17 09/05/2013   AST 19 09/05/2013   ALKPHOS 56 09/05/2013   BILITOT 0.6 09/05/2013   Lab Results  Component Value Date   CHOL 167 09/05/2013   Lab Results  Component Value Date   HDL 53 09/05/2013   Lab Results  Component Value Date   LDLCALC 98 09/05/2013   Lab Results  Component Value Date   TRIG 82 09/05/2013   Lab Results  Component Value Date   CHOLHDL 3.2 09/05/2013     Assessment & Plan  Unspecified constipation Encouraged increased hydration and fiber in diet. Daily probiotics. If bowels not moving can use MOM 2 tbls po in 4 oz of warm prune juice by mouth every 2-3 days. If no results then repeat in 4 hours with  Dulcolax suppository pr, may repeat again in 4 more hours as needed.  Seek care if symptoms worsen. Consider daily Miralax and/or Dulcolax if symptoms persist.   HTN (hypertension) Well controlled, no changes to meds. Encouraged heart healthy diet such as the DASH diet and exercise as tolerated.   Hyperglycemia hgba1c acceptable, minimize simple carbs. Increase exercise as tolerated. Repeat A1C today  Nocturia Unchanged from baseline  Hyperlipidemia Encouraged heart healthy diet, increase exercise, avoid trans fats, consider a krill oil cap daily  Personal history of colonic  polyps Recent colonoscopy showed one polyp but at this point they are not recommending repeat colonoscopy for screening purposes.

## 2014-01-04 NOTE — Assessment & Plan Note (Signed)
Encouraged increased hydration and fiber in diet. Daily probiotics. If bowels not moving can use MOM 2 tbls po in 4 oz of warm prune juice by mouth every 2-3 days. If no results then repeat in 4 hours with  Dulcolax suppository pr, may repeat again in 4 more hours as needed. Seek care if symptoms worsen. Consider daily Miralax and/or Dulcolax if symptoms persist.  

## 2014-01-08 ENCOUNTER — Telehealth: Payer: Self-pay | Admitting: Family Medicine

## 2014-01-08 NOTE — Telephone Encounter (Signed)
Relevant patient education mailed to patient.  

## 2014-01-09 LAB — RENAL FUNCTION PANEL
ALBUMIN: 4 g/dL (ref 3.5–5.2)
BUN: 11 mg/dL (ref 6–23)
CALCIUM: 9.7 mg/dL (ref 8.4–10.5)
CO2: 31 mEq/L (ref 19–32)
CREATININE: 0.78 mg/dL (ref 0.50–1.10)
Chloride: 94 mEq/L — ABNORMAL LOW (ref 96–112)
GLUCOSE: 98 mg/dL (ref 70–99)
Phosphorus: 3.2 mg/dL (ref 2.3–4.6)
Potassium: 4.3 mEq/L (ref 3.5–5.3)
SODIUM: 132 meq/L — AB (ref 135–145)

## 2014-01-09 LAB — HEMOGLOBIN A1C
Hgb A1c MFr Bld: 5.9 % — ABNORMAL HIGH (ref <5.7)
Mean Plasma Glucose: 123 mg/dL — ABNORMAL HIGH (ref <117)

## 2014-01-10 ENCOUNTER — Ambulatory Visit (INDEPENDENT_AMBULATORY_CARE_PROVIDER_SITE_OTHER): Payer: Medicare Other | Admitting: Physician Assistant

## 2014-01-10 ENCOUNTER — Encounter: Payer: Self-pay | Admitting: Physician Assistant

## 2014-01-10 ENCOUNTER — Telehealth: Payer: Self-pay | Admitting: *Deleted

## 2014-01-10 VITALS — BP 130/68 | HR 76 | Temp 98.5°F | Resp 16 | Ht 59.5 in | Wt 130.5 lb

## 2014-01-10 DIAGNOSIS — E871 Hypo-osmolality and hyponatremia: Secondary | ICD-10-CM

## 2014-01-10 DIAGNOSIS — W458XXA Other foreign body or object entering through skin, initial encounter: Secondary | ICD-10-CM

## 2014-01-10 DIAGNOSIS — T148XXA Other injury of unspecified body region, initial encounter: Secondary | ICD-10-CM

## 2014-01-10 NOTE — Patient Instructions (Signed)
I was able to remove the entire splinter from your finger.  I have applied some neosporin to the area.  No need to keep area bandaged.  Please decrease fluid intake by 1 glass of water daily over the next couple of weeks.,  Return to lab for repeat labs per Dr. Frederik Pear instructions.  Potassium Content of Foods Potassium is a mineral found in many foods and drinks. It helps keep fluids and minerals balanced in your body and also affects how steadily your heart beats. The body needs potassium to control blood pressure and to keep the muscles and nervous system healthy. However, certain health conditions and medicine may require you to eat more or less potassium-rich foods and drinks. Your caregiver or dietitian will tell you how much potassium you should have each day. COMMON SERVING SIZES The list below tells you how big or small common portion sizes are:  1 oz.........4 stacked dice.  3 oz........Marland KitchenDeck of cards.  1 tsp.......Marland KitchenTip of little finger.  1 tbsp....Marland KitchenMarland KitchenThumb.  2 tbsp....Marland KitchenMarland KitchenGolf ball.   c..........Marland KitchenHalf of a fist.  1 c...........Marland KitchenA fist. FOODS AND DRINKS HIGH IN POTASSIUM More than 200 mg of potassium per serving. A serving size is  c (120 mL or noted gram weight) unless otherwise stated. While all the items on this list are high in potassium, some items are higher in potassium than others. Fruits  Apricots (sliced), 83 g.  Apricots (dried halves), 3 oz / 24 g.  Avocado (cubed),  c / 50 g.  Banana (sliced), 75 g.  Cantaloupe (cubed), 80 g.  Dates (pitted), 5 whole / 35 g.  Figs (dried), 4 whole / 32 g.  Guava, c / 55 g.  Honeydew, 1 wedge / 85 g.  Kiwi (sliced), 90 g.  Nectarine, 1 small / 129 g.  Orange, 1 medium / 131 g.  Orange juice.  Pomegranate seeds, 87 g.  Pomegranate juice.  Prunes (pitted), 3 whole / 30 g.  Prune juice, 3 oz / 90 mL.  Seedless raisins, 3 tbsp / 27 g. Vegetables  Artichoke,  of a medium / 64 g.  Asparagus (boiled), 90  g.  Baked beans,  c / 63 g.  Bamboo shoots,  c / 38 g.  Beets (cooked slices), 85 g.  Broccoli (boiled), 78 g.  Brussels sprout (boiled), 78 g.  Butternut squash (baked), 103 g.  Chickpea (cooked), 82 g.  Green peas (cooked), 80 g.  Hubbard squash (baked cubes),  c / 68 g.  Kidney beans (cooked), 5 tbsp / 55 g.  Lima beans (cooked),  c / 43 g.  Navy beans (cooked),  c / 61 g.  Potato (baked), 61 g.  Potato (boiled), 78 g.  Pumpkin (boiled), 123 g.  Refried beans,  c / 79 g.  Spinach (cooked),  c / 45 g.  Split peas (cooked),  c / 65 g.  Sun-dried tomatoes, 2 tbsp / 7 g.  Sweet potato (baked),  c / 50 g.  Tomato (chopped or sliced), 90 g.  Tomato juice.  Tomato paste, 4 tsp / 21 g.  Tomato sauce,  c / 61 g.  Vegetable juice.  White mushrooms (cooked), 78 g.  Yam (cooked or baked),  c / 34 g.  Zucchini squash (boiled), 90 g. Other Foods and Drinks  Almonds (whole),  c / 36 g.  Cashews (oil roasted),  c / 32 g.  Chocolate milk.  Chocolate pudding, 142 g.  Clams (steamed), 1.5 oz / 43 g.  Dark chocolate, 1.5 oz / 42 g.  Fish, 3 oz / 85 g.  King crab (steamed), 3 oz / 85 g.  Lobster (steamed), 4 oz / 113 g.  Milk (skim, 1%, 2%, whole), 1 c / 240 mL.  Milk chocolate, 2.3 oz / 66 g.  Milk shake.  Nonfat fruit variety yogurt, 123 g.  Peanuts (oil roasted), 1 oz / 28 g.  Peanut butter, 2 tbsp / 32 g.  Pistachio nuts, 1 oz / 28 g.  Pumpkin seeds, 1 oz / 28 g.  Red meat (broiled, cooked, grilled), 3 oz / 85 g.  Scallops (steamed), 3 oz / 85 g.  Shredded wheat cereal (dry), 3 oblong biscuits / 75 g.  Spaghetti sauce,  c / 66 g.  Sunflower seeds (dry roasted), 1 oz / 28 g.  Veggie burger, 1 patty / 70 g. FOODS MODERATE IN POTASSIUM Between 150 mg and 200 mg per serving. A serving is  c (120 mL or noted gram weight) unless otherwise stated. Fruits  Grapefruit,  of the fruit / 123 g.  Grapefruit  juice.  Pineapple juice.  Plums (sliced), 83 g.  Tangerine, 1 large / 120 g. Vegetables  Carrots (boiled), 78 g.  Carrots (sliced), 61 g.  Rhubarb (cooked with sugar), 120 g.  Rutabaga (cooked), 120 g.  Sweet corn (cooked), 75 g.  Yellow snap beans (cooked), 63 g. Other Foods and Drinks   Bagel, 1 bagel / 98 g.  Chicken breast (roasted and chopped),  c / 70 g.  Chocolate ice cream / 66 g.  Pita bread, 1 large / 64 g.  Shrimp (steamed), 4 oz / 113 g.  Swiss cheese (diced), 70 g.  Vanilla ice cream, 66 g.  Vanilla pudding, 140 g. FOODS LOW IN POTASSIUM Less than 150 mg per serving. A serving size is  cup (120 mL or noted gram weight) unless otherwise stated. If you eat more than 1 serving of a food low in potassium, the food may be considered a food high in potassium. Fruits  Apple (slices), 55 g.  Apple juice.  Applesauce, 122 g.  Blackberries, 72 g.  Blueberries, 74 g.  Cranberries, 50 g.  Cranberry juice.  Fruit cocktail, 119 g.  Fruit punch.  Grapes, 46 g.  Grape juice.  Mandarin oranges (canned), 126 g.  Peach (slices), 77 g.  Pineapple (chunks), 83 g.  Raspberries, 62 g.  Red cherries (without pits), 78 g.  Strawberries (sliced), 83 g.  Watermelon (diced), 76 g. Vegetables  Alfalfa sprouts, 17 g.  Bell peppers (sliced), 46 g.  Cabbage (shredded), 35 g.  Cauliflower (boiled), 62 g.  Celery, 51 g.  Collard greens (boiled), 95 g.  Cucumber (sliced), 52 g.  Eggplant (cubed), 41 g.  Green beans (boiled), 63 g.  Lettuce (shredded), 1 c / 36 g.  Onions (sauteed), 44 g.  Radishes (sliced), 58 g.  Spaghetti squash, 51 g. Other Foods and Drinks  [employer], 1 slice / 28 g.  Black tea.  Brown rice (cooked), 98 g.  Butter croissant, 1 medium / 57 g.  Carbonated soda.  Coffee.  Cheddar cheese (diced), 66 g.  Corn flake cereal (dry), 14 g.  Cottage cheese, 118 g.  Cream of rice cereal (cooked), 122  g.  Cream of wheat cereal (cooked), 126 g.  Crisped rice cereal (dry), 14 g.  Egg (boiled, fried, poached, omelet, scrambled), 1 large / 46 61 g.  English muffin, 1 muffin / 57 g.  Frozen ice pop, 1 pop / 55 g.  Graham cracker, 1 large rectangular cracker / 14 g.  Jelly beans, 112 g.  Non-dairy whipped topping.  Oatmeal, 88 g.  Orange sherbet, 74 g.  Puffed rice cereal (dry), 7 g.  Pasta (cooked), 70 g.  Rice cakes, 4 cakes / 36 g.  Sugared doughnut, 4 oz / 116 g.  White bread, 1 slice / 30 g.  White rice (cooked), 79 93 g.  Wild rice (cooked), 82 g.  Yellow cake, 1 slice / 68 g. Document Released: 05/05/2005 Document Revised: 09/07/2012 Document Reviewed: 02/05/2012 [facility] Patient Information 2014 [place].

## 2014-01-10 NOTE — Telephone Encounter (Signed)
Message copied by Ronny Flurry on Wed Jan 10, 2014  4:12 PM ------      Message from: Penni Homans A      Created: Wed Jan 10, 2014  2:19 PM       Notify sugar looks good but sodium is slightly low. Encourage to decrease water/beverage intake by 1 beverage daily and repeat renal panel in 2-3 weeks ------

## 2014-01-10 NOTE — Progress Notes (Signed)
Pre visit review using our clinic review tool, if applicable. No additional management support is needed unless otherwise documented below in the visit note/SLS  

## 2014-01-10 NOTE — Assessment & Plan Note (Signed)
Area cleansed prior to foreign object removal. Splinter removed via small needle and tweezers.  Topical antibiotic ointment and bandage applied.  No need for antibiotic therapy.

## 2014-01-10 NOTE — Telephone Encounter (Signed)
Notified pt, lab order entered.

## 2014-01-10 NOTE — Progress Notes (Signed)
Patient presents to clinic today c/o splinter of DIP of IV right phalanx that has been present for 4 days.  Patient states she got the splinter caught in her hand while gardening.  Denies redness or swelling of digit.  Denies fever, chills, malaise.  Tried to get splinter out herself but could not.    Past Medical History  Diagnosis Date  . History of chicken pox     childhood  . Diverticulitis   . HTN (hypertension)   . Colon polyps   . Urinary incontinence   . Unspecified constipation 02/06/2013  . Nocturia 02/08/2013  . Viral infection characterized by skin and mucous membrane lesions 05/11/2013  . Personal history of colonic polyps 09/05/2013  . Hyperglycemia 09/05/2013  . Diverticulosis 01/04/2014    Current Outpatient Prescriptions on File Prior to Visit  Medication Sig Dispense Refill  . atenolol (TENORMIN) 25 MG tablet Take 1 tablet (25 mg total) by mouth daily.  90 tablet  1  . hydrochlorothiazide (HYDRODIURIL) 25 MG tablet Take 1 tablet (25 mg total) by mouth daily.  90 tablet  1  . Multiple Vitamin (MULTIVITAMIN) tablet Take 1 tablet by mouth daily.        . Omega-3 Fatty Acids (FISH OIL PO) Take by mouth daily.      Marland Kitchen Propylene Glycol (SYSTANE BALANCE OP) Apply 1 drop to eye daily as needed.      . timolol (BETIMOL) 0.5 % ophthalmic solution Place 1 drop into the left eye 2 (two) times daily.      . Wheat Dextrin (BENEFIBER PO) Take by mouth.       No current facility-administered medications on file prior to visit.    No Known Allergies  Family History  Problem Relation Age of Onset  . Colon cancer Mother   . Hypertension Mother   . Heart disease Father   . Meniere's disease Father   . Heart disease Sister   . Arthritis Son     back  . Glaucoma Son   . Hypertension Son   . Cataracts Son   . Colon cancer Sister   . Alzheimer's disease Sister   . Glaucoma Sister   . Glaucoma Sister   . Hyperlipidemia Sister   . Hypertension Sister   . Cancer Sister     thyroid  .  Diabetes Sister   . Glaucoma Sister     History   Social History  . Marital Status: Married    Spouse Name: N/A    Number of Children: N/A  . Years of Education: N/A   Social History Main Topics  . Smoking status: Never Smoker   . Smokeless tobacco: Never Used  . Alcohol Use: No  . Drug Use: None  . Sexual Activity: None   Other Topics Concern  . None   Social History Narrative  . None    Review of Systems - See HPI.  All other ROS are negative.  BP 130/68  Pulse 76  Temp(Src) 98.5 F (36.9 C) (Oral)  Resp 16  Ht 4' 11.5" (1.511 m)  Wt 130 lb 8 oz (59.194 kg)  BMI 25.93 kg/m2  SpO2 98%  Physical Exam  Constitutional: She is oriented to person, place, and time and well-developed, well-nourished, and in no distress.  HENT:  Head: Normocephalic and atraumatic.  Eyes: Conjunctivae are normal.  Neurological: She is alert and oriented to person, place, and time. No cranial nerve deficit.  Skin: Skin is warm and dry.  Presence  of a miniscule wooden splinter in DIP of right fourth phalanx without evidence of cellulitis.  Psychiatric: Affect normal.    Recent Results (from the past 2160 hour(s))  HM COLONOSCOPY     Status: None   Collection Time    12/19/13 12:00 AM      Result Value Ref Range   HM Colonoscopy historical- polyp removed    HEMOGLOBIN A1C     Status: Abnormal   Collection Time    01/09/14 11:35 AM      Result Value Ref Range   Hemoglobin A1C 5.9 (*) <5.7 %   Comment:                                                                            According to the ADA Clinical Practice Recommendations for 2011, when     HbA1c is used as a screening test:             >=6.5%   Diagnostic of Diabetes Mellitus                (if abnormal result is confirmed)           5.7-6.4%   Increased risk of developing Diabetes Mellitus           References:Diagnosis and Classification of Diabetes Mellitus,Diabetes     GEZM,6294,76(LYYTK 1):S62-S69 and Standards  of Medical Care in             Diabetes - 2011,Diabetes Care,2011,34 (Suppl 1):S11-S61.         Mean Plasma Glucose 123 (*) <117 mg/dL  RENAL FUNCTION PANEL     Status: Abnormal   Collection Time    01/09/14 11:35 AM      Result Value Ref Range   Sodium 132 (*) 135 - 145 mEq/L   Potassium 4.3  3.5 - 5.3 mEq/L   Chloride 94 (*) 96 - 112 mEq/L   CO2 31  19 - 32 mEq/L   Glucose, Bld 98  70 - 99 mg/dL   BUN 11  6 - 23 mg/dL   Creat 0.78  0.50 - 1.10 mg/dL   Albumin 4.0  3.5 - 5.2 g/dL   Calcium 9.7  8.4 - 10.5 mg/dL   Phosphorus 3.2  2.3 - 4.6 mg/dL   Assessment/Plan: Splinter in skin Area cleansed prior to foreign object removal. Splinter removed via small needle and tweezers.  Topical antibiotic ointment and bandage applied.  No need for antibiotic therapy.

## 2014-01-26 LAB — HM MAMMOGRAPHY: HM Mammogram: NORMAL

## 2014-01-29 ENCOUNTER — Ambulatory Visit (INDEPENDENT_AMBULATORY_CARE_PROVIDER_SITE_OTHER): Payer: Medicare Other | Admitting: Family

## 2014-01-29 ENCOUNTER — Encounter: Payer: Self-pay | Admitting: Family

## 2014-01-29 ENCOUNTER — Encounter: Payer: Self-pay | Admitting: Family Medicine

## 2014-01-29 VITALS — BP 140/60 | HR 47 | Temp 97.4°F | Resp 16 | Ht 59.5 in | Wt 127.0 lb

## 2014-01-29 DIAGNOSIS — T148 Other injury of unspecified body region: Secondary | ICD-10-CM

## 2014-01-29 DIAGNOSIS — W57XXXA Bitten or stung by nonvenomous insect and other nonvenomous arthropods, initial encounter: Secondary | ICD-10-CM | POA: Insufficient documentation

## 2014-01-29 NOTE — Progress Notes (Signed)
Subjective:    Patient ID: Amber Morgan, female    DOB: December 19, 1926, 80 y.o.   MRN: [id number]  HPI  Amber Morgan is an 80 yr old female who presents today with chief complaint of "tick bite." She reports that she first saw area of redness on Friday afternoon. On Saturday she scratched it because the bite was pruritic.Thinks she scratched a tick off of the skin but "it fell on the floor and I couldn't find it." Denies rashes, fever or new joint pain.   Review of Systems See HPI  Past Medical History  Diagnosis Date  . History of chicken pox     childhood  . Diverticulitis   . HTN (hypertension)   . Colon polyps   . Urinary incontinence   . Unspecified constipation 02/06/2013  . Nocturia 02/08/2013  . Viral infection characterized by skin and mucous membrane lesions 05/11/2013  . Personal history of colonic polyps 09/05/2013  . Hyperglycemia 09/05/2013  . Diverticulosis 01/04/2014    History   Social History  . Marital Status: Married    Spouse Name: N/A    Number of Children: N/A  . Years of Education: N/A   Occupational History  . Not on file.   Social History Main Topics  . Smoking status: Never Smoker   . Smokeless tobacco: Never Used  . Alcohol Use: No  . Drug Use: Not on file  . Sexual Activity: Not on file   Other Topics Concern  . Not on file   Social History Narrative  . No narrative on file    Past Surgical History  Procedure Laterality Date  . Eye surgery      multiple bilateral  . Bladder surgery  2003    bladder tact  . Abdominal hysterectomy      Family History  Problem Relation Age of Onset  . Colon cancer Mother   . Hypertension Mother   . Heart disease Father   . Meniere's disease Father   . Heart disease Sister   . Arthritis Son     back  . Glaucoma Son   . Hypertension Son   . Cataracts Son   . Colon cancer Sister   . Alzheimer's disease Sister   . Glaucoma Sister   . Glaucoma Sister   . Hyperlipidemia Sister   . Hypertension  Sister   . Cancer Sister     thyroid  . Diabetes Sister   . Glaucoma Sister     No Known Allergies  Current Outpatient Prescriptions on File Prior to Visit  Medication Sig Dispense Refill  . atenolol (TENORMIN) 25 MG tablet Take 1 tablet (25 mg total) by mouth daily.  90 tablet  1  . hydrochlorothiazide (HYDRODIURIL) 25 MG tablet Take 1 tablet (25 mg total) by mouth daily.  90 tablet  1  . Multiple Vitamins-Minerals (ONE-A-DAY WOMENS 50 PLUS PO) Take 1 each by mouth daily.      . Omega-3 Fatty Acids (FISH OIL PO) Take by mouth daily.      Marland Kitchen Propylene Glycol (SYSTANE BALANCE OP) Apply 1 drop to eye daily as needed.      . timolol (BETIMOL) 0.5 % ophthalmic solution Place 1 drop into the left eye 2 (two) times daily.      . Wheat Dextrin (BENEFIBER PO) Take by mouth.       No current facility-administered medications on file prior to visit.    BP 140/60  Pulse 47  Temp(Src) 97.4 F (  36.3 C) (Oral)  Resp 16  Ht 4' 11.5" (1.511 m)  Wt 127 lb 0.6 oz (57.625 kg)  BMI 25.24 kg/m2  SpO2 99%       Objective:   Physical Exam  Constitutional: She is oriented to person, place, and time. She appears well-developed and well-nourished. No distress.  HENT:  Head: Normocephalic and atraumatic.  Cardiovascular: Normal rate and regular rhythm.   No murmur heard. Pulmonary/Chest: Effort normal and breath sounds normal. No respiratory distress. She has no wheezes. She has no rales. She exhibits no tenderness.  Musculoskeletal: She exhibits no edema.  Neurological: She is alert and oriented to person, place, and time.  Skin: Skin is warm.  Small scab left anterior thigh. 1 inch diameter erythema noted surrounding scab.    Psychiatric: She has a normal mood and affect. Her behavior is normal. Judgment and thought content normal.          Assessment & Plan:

## 2014-01-29 NOTE — Assessment & Plan Note (Signed)
Reviewed up to date recommendations. She does not meet criteria for empiric treatment for prophylactic rx for lyme disease.  She is advised on signs/symptoms of lyme disease and to contact us if she develops any of these symptoms.  Pt verbalizes understanding.

## 2014-01-29 NOTE — Progress Notes (Signed)
Pre visit review using our clinic review tool, if applicable. No additional management support is needed unless otherwise documented below in the visit note. 

## 2014-01-29 NOTE — Patient Instructions (Signed)
Tick Bite Information Ticks are insects that attach themselves to the skin and draw blood for food. There are various types of ticks. Common types include wood ticks and deer ticks. Most ticks live in shrubs and grassy areas. Ticks can climb onto your body when you make contact with leaves or grass where the tick is waiting. The most common places on the body for ticks to attach themselves are the scalp, neck, armpits, waist, and groin. Most tick bites are harmless, but sometimes ticks carry germs that cause diseases. These germs can be spread to a person during the tick's feeding process. The chance of a disease spreading through a tick bite depends on:   The type of tick.  Time of year.   How long the tick is attached.   Geographic location.  HOW CAN YOU PREVENT TICK BITES? Take these steps to help prevent tick bites when you are outdoors:  Wear protective clothing. Long sleeves and long pants are best.   Wear white clothes so you can see ticks more easily.  Tuck your pant legs into your socks.   If walking on a trail, stay in the middle of the trail to avoid brushing against bushes.  Avoid walking through areas with long grass.  Put insect repellent on all exposed skin and along boot tops, pant legs, and sleeve cuffs.   Check clothing, hair, and skin repeatedly and before going inside.   Brush off any ticks that are not attached.  Take a shower or bath as soon as possible after being outdoors.  WHAT IS THE PROPER WAY TO REMOVE A TICK? Ticks should be removed as soon as possible to help prevent diseases caused by tick bites. 1. If latex gloves are available, put them on before trying to remove a tick.  2. Using fine-point tweezers, grasp the tick as close to the skin as possible. You may also use curved forceps or a tick removal tool. Grasp the tick as close to its head as possible. Avoid grasping the tick on its body. 3. Pull gently with steady upward pressure until  the tick lets go. Do not twist the tick or jerk it suddenly. This may break off the tick's head or mouth parts. 4. Do not squeeze or crush the tick's body. This could force disease-carrying fluids from the tick into your body.  5. After the tick is removed, wash the bite area and your hands with soap and water or other disinfectant such as alcohol. 6. Apply a small amount of antiseptic cream or ointment to the bite site.  7. Wash and disinfect any instruments that were used.  Do not try to remove a tick by applying a hot match, petroleum jelly, or fingernail polish to the tick. These methods do not work and may increase the chances of disease being spread from the tick bite.  WHEN SHOULD YOU SEEK MEDICAL CARE? Contact your health care provider if you are unable to remove a tick from your skin or if a part of the tick breaks off and is stuck in the skin.  After a tick bite, you need to be aware of signs and symptoms that could be related to diseases spread by ticks. Contact your health care provider if you develop any of the following in the days or weeks after the tick bite:  Unexplained fever.  Rash. A circular rash that appears days or weeks after the tick bite may indicate the possibility of Lyme disease. The rash may resemble   a target with a bull's-eye and may occur at a different part of your body than the tick bite.  Redness and swelling in the area of the tick bite.   Tender, swollen lymph glands.   Diarrhea.   Weight loss.   Cough.   Fatigue.   Muscle, joint, or bone pain.   Abdominal pain.   Headache.   Lethargy or a change in your level of consciousness.  Difficulty walking or moving your legs.   Numbness in the legs.   Paralysis.  Shortness of breath.   Confusion.   Repeated vomiting.  Document Released: 09/18/2000 Document Revised: 07/12/2013 Document Reviewed: 03/01/2013 [employer] Patient Information 2014 [employer].  

## 2014-01-30 LAB — RENAL FUNCTION PANEL
ALBUMIN: 4 g/dL (ref 3.5–5.2)
BUN: 17 mg/dL (ref 6–23)
CALCIUM: 10.5 mg/dL (ref 8.4–10.5)
CO2: 31 mEq/L (ref 19–32)
CREATININE: 0.84 mg/dL (ref 0.50–1.10)
Chloride: 97 mEq/L (ref 96–112)
Glucose, Bld: 94 mg/dL (ref 70–99)
PHOSPHORUS: 3 mg/dL (ref 2.3–4.6)
POTASSIUM: 4.5 meq/L (ref 3.5–5.3)
Sodium: 136 mEq/L (ref 135–145)

## 2014-03-07 ENCOUNTER — Encounter: Payer: Self-pay | Admitting: Family Medicine

## 2014-03-19 ENCOUNTER — Ambulatory Visit (INDEPENDENT_AMBULATORY_CARE_PROVIDER_SITE_OTHER): Payer: Medicare Other | Admitting: Physician Assistant

## 2014-03-19 ENCOUNTER — Encounter: Payer: Self-pay | Admitting: Physician Assistant

## 2014-03-19 VITALS — BP 112/46 | HR 73 | Temp 97.9°F | Resp 14 | Ht 59.5 in | Wt 123.5 lb

## 2014-03-19 DIAGNOSIS — T148 Other injury of unspecified body region: Secondary | ICD-10-CM

## 2014-03-19 DIAGNOSIS — L0390 Cellulitis, unspecified: Secondary | ICD-10-CM

## 2014-03-19 DIAGNOSIS — W57XXXA Bitten or stung by nonvenomous insect and other nonvenomous arthropods, initial encounter: Secondary | ICD-10-CM

## 2014-03-19 DIAGNOSIS — L299 Pruritus, unspecified: Secondary | ICD-10-CM

## 2014-03-19 DIAGNOSIS — L0291 Cutaneous abscess, unspecified: Secondary | ICD-10-CM

## 2014-03-19 LAB — CBC
HCT: 34.5 % — ABNORMAL LOW (ref 36.0–46.0)
Hemoglobin: 11.9 g/dL — ABNORMAL LOW (ref 12.0–15.0)
MCH: 31.2 pg (ref 26.0–34.0)
MCHC: 34.5 g/dL (ref 30.0–36.0)
MCV: 90.3 fL (ref 78.0–100.0)
PLATELETS: 268 10*3/uL (ref 150–400)
RBC: 3.82 MIL/uL — ABNORMAL LOW (ref 3.87–5.11)
RDW: 13.6 % (ref 11.5–15.5)
WBC: 6.5 10*3/uL (ref 4.0–10.5)

## 2014-03-19 LAB — SEDIMENTATION RATE: SED RATE: 11 mm/h (ref 0–22)

## 2014-03-19 MED ORDER — TRIAMCINOLONE ACETONIDE 0.1 % EX OINT
1.0000 | TOPICAL_OINTMENT | Freq: Two times a day (BID) | CUTANEOUS | Status: DC
Start: 2014-03-19 — End: 2014-12-07

## 2014-03-19 MED ORDER — DOXYCYCLINE HYCLATE 100 MG PO TABS: 100.0000 mg | ORAL_TABLET | Freq: Two times a day (BID) | ORAL | Status: AC

## 2014-03-19 NOTE — Patient Instructions (Signed)
Please take antibiotic as directed.  USe Steroid ointment to help with itch. Place a cool compress on the rash.  Use over-the-counter Sarna lotion to help with itch as well.  Avoid heat as this will worsen symptoms.  Because of recent tick bites, we will check for tick-borne illnesses althougth your rash is not a typical rash associated with these illnesses.  Follow-up in 1 week if symptoms are not improving.

## 2014-03-19 NOTE — Assessment & Plan Note (Signed)
With surrounding cellulitis.  Will Rx doxycycline to cover cellulitis and preemptively for rickettsial infection, if present.  Will obtain labs to r/o rickesttsial infection.  Sarna lotion or Triamcinolone to help with itch.  Cool compresses.  Call or return if symptoms are not improving with antibiotic.

## 2014-03-19 NOTE — Progress Notes (Signed)
Patient presents to clinic today c/o multiple tick bites over the past 2 months.  Patient now with tender and pruritic rash of the right-side of her chest, worsening over the past few days.  Patient denies target rash or rash of soles and palms.  Denies fever, chills, aches or AMS.    Past Medical History  Diagnosis Date  . History of chicken pox     childhood  . Diverticulitis   . HTN (hypertension)   . Colon polyps   . Urinary incontinence   . Unspecified constipation 02/06/2013  . Nocturia 02/08/2013  . Viral infection characterized by skin and mucous membrane lesions 05/11/2013  . Personal history of colonic polyps 09/05/2013  . Hyperglycemia 09/05/2013  . Diverticulosis 01/04/2014    Current Outpatient Prescriptions on File Prior to Visit  Medication Sig Dispense Refill  . atenolol (TENORMIN) 25 MG tablet Take 1 tablet (25 mg total) by mouth daily.  90 tablet  1  . hydrochlorothiazide (HYDRODIURIL) 25 MG tablet Take 1 tablet (25 mg total) by mouth daily.  90 tablet  1  . Multiple Vitamins-Minerals (ONE-A-DAY WOMENS 50 PLUS PO) Take 1 each by mouth daily.      . Omega-3 Fatty Acids (FISH OIL PO) Take by mouth daily.      . Polyethylene Glycol 3350 (MIRALAX PO) Take by mouth. Alternates with benefiber      . Propylene Glycol (SYSTANE BALANCE OP) Apply 1 drop to eye daily as needed.      . timolol (BETIMOL) 0.5 % ophthalmic solution Place 1 drop into the left eye 2 (two) times daily.       No current facility-administered medications on file prior to visit.    No Known Allergies  Family History  Problem Relation Age of Onset  . Colon cancer Mother   . Hypertension Mother   . Heart disease Father   . Meniere's disease Father   . Heart disease Sister   . Arthritis Son     back  . Glaucoma Son   . Hypertension Son   . Cataracts Son   . Colon cancer Sister   . Alzheimer's disease Sister   . Glaucoma Sister   . Glaucoma Sister   . Hyperlipidemia Sister   . Hypertension Sister    . Cancer Sister     thyroid  . Diabetes Sister   . Glaucoma Sister     History   Social History  . Marital Status: Married    Spouse Name: N/A    Number of Children: N/A  . Years of Education: N/A   Social History Main Topics  . Smoking status: Never Smoker   . Smokeless tobacco: Never Used  . Alcohol Use: No  . Drug Use: None  . Sexual Activity: None   Other Topics Concern  . None   Social History Narrative  . None   Review of Systems - See HPI.  All other ROS are negative.  BP 112/46  Pulse 73  Temp(Src) 97.9 F (36.6 C) (Oral)  Resp 14  Ht 4' 11.5" (1.511 m)  Wt 123 lb 8 oz (56.019 kg)  BMI 24.54 kg/m2  SpO2 97%  Physical Exam  Vitals reviewed. Constitutional: She is oriented to person, place, and time and well-developed, well-nourished, and in no distress.  HENT:  Head: Normocephalic and atraumatic.  Cardiovascular: Normal rate, regular rhythm, normal heart sounds and intact distal pulses.   Pulmonary/Chest: Effort normal and breath sounds normal. No respiratory distress. She has  no wheezes. She has no rales. She exhibits no tenderness.  Neurological: She is alert and oriented to person, place, and time.  Skin: Skin is warm and dry.  Area of erythema and warmth of upper right chest without drainage.  Possible punctate bite in center or region.  Some excoriation noted.   Healed insect bite of right hip, lower abdomen noted without erythema.   Psychiatric: Affect normal.    Recent Results (from the past 2160 hour(s))  HEMOGLOBIN A1C     Status: Abnormal   Collection Time    01/09/14 11:35 AM      Result Value Ref Range   Hemoglobin A1C 5.9 (*) <5.7 %   Comment:                                                                            According to the ADA Clinical Practice Recommendations for 2011, when     HbA1c is used as a screening test:             >=6.5%   Diagnostic of Diabetes Mellitus                (if abnormal result is confirmed)            5.7-6.4%   Increased risk of developing Diabetes Mellitus           References:Diagnosis and Classification of Diabetes Mellitus,Diabetes     PFXT,0240,97(DZHGD 1):S62-S69 and Standards of Medical Care in             Diabetes - 2011,Diabetes Care,2011,34 (Suppl 1):S11-S61.         Mean Plasma Glucose 123 (*) <117 mg/dL  RENAL FUNCTION PANEL     Status: Abnormal   Collection Time    01/09/14 11:35 AM      Result Value Ref Range   Sodium 132 (*) 135 - 145 mEq/L   Potassium 4.3  3.5 - 5.3 mEq/L   Chloride 94 (*) 96 - 112 mEq/L   CO2 31  19 - 32 mEq/L   Glucose, Bld 98  70 - 99 mg/dL   BUN 11  6 - 23 mg/dL   Creat 0.78  0.50 - 1.10 mg/dL   Albumin 4.0  3.5 - 5.2 g/dL   Calcium 9.7  8.4 - 10.5 mg/dL   Phosphorus 3.2  2.3 - 4.6 mg/dL  HM MAMMOGRAPHY     Status: None   Collection Time    01/26/14 12:00 AM      Result Value Ref Range   [facility] Mammogram [facility]- normal    RENAL FUNCTION PANEL     Status: None   Collection Time    01/29/14 10:00 AM      Result Value Ref Range   Sodium 136  135 - 145 mEq/L   Potassium 4.5  3.5 - 5.3 mEq/L   Chloride 97  96 - 112 mEq/L   CO2 31  19 - 32 mEq/L   Glucose, Bld 94  70 - 99 mg/dL   BUN 17  6 - 23 mg/dL   Creat 0.84  0.50 - 1.10 mg/dL   Albumin 4.0  3.5 - 5.2 g/dL   Calcium 10.5  8.4 - 10.5 mg/dL   Phosphorus 3.0  2.3 - 4.6 mg/dL    Assessment/Plan: Tick bite With surrounding cellulitis.  Will Rx doxycycline to cover cellulitis and preemptively for rickettsial infection, if present.  Will obtain labs to r/o rickesttsial infection.  Sarna lotion or Triamcinolone to help with itch.  Cool compresses.  Call or return if symptoms are not improving with antibiotic.

## 2014-03-19 NOTE — Progress Notes (Signed)
Pre visit review using our clinic review tool, if applicable. No additional management support is needed unless otherwise documented below in the visit note/SLS  

## 2014-03-20 LAB — ROCKY MTN SPOTTED FVR ABS PNL(IGG+IGM)
RMSF IgG: 0.43 IV
RMSF IgM: 0.3 IV

## 2014-03-20 LAB — B. BURGDORFI ANTIBODIES: B burgdorferi Ab IgG+IgM: 0.47 {ISR}

## 2014-05-07 ENCOUNTER — Ambulatory Visit: Payer: Medicare Other | Admitting: Family Medicine

## 2014-05-25 ENCOUNTER — Telehealth: Payer: Self-pay | Admitting: Family Medicine

## 2014-05-25 DIAGNOSIS — R7309 Other abnormal glucose: Secondary | ICD-10-CM

## 2014-05-25 DIAGNOSIS — I10 Essential (primary) hypertension: Secondary | ICD-10-CM

## 2014-05-25 DIAGNOSIS — E785 Hyperlipidemia, unspecified: Secondary | ICD-10-CM

## 2014-05-25 DIAGNOSIS — Z79899 Other long term (current) drug therapy: Secondary | ICD-10-CM

## 2014-05-25 LAB — LIPID PANEL
CHOL/HDL RATIO: 2.9 ratio
Cholesterol: 173 mg/dL (ref 0–200)
HDL: 59 mg/dL (ref >39)
LDL Cholesterol: 97 mg/dL (ref 0–99)
Triglycerides: 87 mg/dL (ref <150)
VLDL: 17 mg/dL (ref 0–40)

## 2014-05-25 LAB — CBC WITH DIFFERENTIAL/PLATELET
BASOS PCT: 0 % (ref 0–1)
Basophils Absolute: 0 10*3/uL (ref 0.0–0.1)
Eosinophils Absolute: 0.1 10*3/uL (ref 0.0–0.7)
Eosinophils Relative: 2 % (ref 0–5)
HEMATOCRIT: 38.6 % (ref 36.0–46.0)
HEMOGLOBIN: 13.4 g/dL (ref 12.0–15.0)
LYMPHS ABS: 1.8 10*3/uL (ref 0.7–4.0)
Lymphocytes Relative: 36 % (ref 12–46)
MCH: 31.5 pg (ref 26.0–34.0)
MCHC: 34.7 g/dL (ref 30.0–36.0)
MCV: 90.8 fL (ref 78.0–100.0)
MONO ABS: 0.4 10*3/uL (ref 0.1–1.0)
MONOS PCT: 8 % (ref 3–12)
NEUTROS ABS: 2.8 10*3/uL (ref 1.7–7.7)
NEUTROS PCT: 54 % (ref 43–77)
Platelets: 127 10*3/uL — ABNORMAL LOW (ref 150–400)
RBC: 4.25 MIL/uL (ref 3.87–5.11)
RDW: 13.5 % (ref 11.5–15.5)
WBC: 5.1 10*3/uL (ref 4.0–10.5)

## 2014-05-25 LAB — BASIC METABOLIC PANEL
BUN: 15 mg/dL (ref 6–23)
CO2: 32 meq/L (ref 19–32)
Calcium: 10.5 mg/dL (ref 8.4–10.5)
Chloride: 100 mEq/L (ref 96–112)
Creat: 0.79 mg/dL (ref 0.50–1.10)
GLUCOSE: 104 mg/dL — AB (ref 70–99)
POTASSIUM: 4.4 meq/L (ref 3.5–5.3)
SODIUM: 138 meq/L (ref 135–145)

## 2014-05-25 LAB — HEPATIC FUNCTION PANEL
ALBUMIN: 4.2 g/dL (ref 3.5–5.2)
ALT: 17 U/L (ref 0–35)
AST: 19 U/L (ref 0–37)
Alkaline Phosphatase: 55 U/L (ref 39–117)
BILIRUBIN DIRECT: 0.1 mg/dL (ref 0.0–0.3)
BILIRUBIN TOTAL: 0.5 mg/dL (ref 0.2–1.2)
Indirect Bilirubin: 0.4 mg/dL (ref 0.2–1.2)
Total Protein: 6.4 g/dL (ref 6.0–8.3)

## 2014-05-25 LAB — HEMOGLOBIN A1C
HEMOGLOBIN A1C: 6.2 % — AB (ref <5.7)
MEAN PLASMA GLUCOSE: 131 mg/dL — AB (ref <117)

## 2014-05-25 LAB — TSH: TSH: 2.571 u[IU]/mL (ref 0.350–4.500)

## 2014-05-25 NOTE — Telephone Encounter (Signed)
Pt presented to lab w/o Orders; orders placed & forwarded to [facility] per phone note from front desk phone note; pt also informed that it is not either the lab tech and/or other office employee's fault if her labs are not ordered when she presents to office, [as she was upset & rude to [place] when she first came in office and found out that there were no lab orders placed] and in the future, she would need to speak to her PCP about this matter, if she was upset about this issue/SLS

## 2014-05-25 NOTE — Telephone Encounter (Signed)
Please enter lab order patient her now Return in about 4 months (around 05/06/2014) for lipid, renal, cbc, tsh, hepatic, hgba1c prior.

## 2014-05-28 ENCOUNTER — Telehealth: Payer: Self-pay | Admitting: Family Medicine

## 2014-05-28 MED ORDER — HYDROCHLOROTHIAZIDE 25 MG PO TABS: 25.0000 mg | ORAL_TABLET | Freq: Every day | ORAL | Status: DC

## 2014-05-28 NOTE — Telephone Encounter (Signed)
Rx sent to pharmacy. LDM 

## 2014-05-28 NOTE — Telephone Encounter (Signed)
Refill- hydrochlorothiazide  [facility] [address]

## 2014-05-31 ENCOUNTER — Other Ambulatory Visit: Payer: Self-pay | Admitting: Family Medicine

## 2014-05-31 ENCOUNTER — Other Ambulatory Visit: Payer: Self-pay

## 2014-05-31 DIAGNOSIS — I10 Essential (primary) hypertension: Secondary | ICD-10-CM

## 2014-05-31 MED ORDER — ATENOLOL 25 MG PO TABS: 25.0000 mg | ORAL_TABLET | Freq: Every day | ORAL | Status: DC

## 2014-05-31 NOTE — Telephone Encounter (Signed)
Opened in error

## 2014-06-01 ENCOUNTER — Ambulatory Visit (INDEPENDENT_AMBULATORY_CARE_PROVIDER_SITE_OTHER): Payer: Medicare Other | Admitting: Family Medicine

## 2014-06-01 ENCOUNTER — Ambulatory Visit (HOSPITAL_BASED_OUTPATIENT_CLINIC_OR_DEPARTMENT_OTHER)
Admission: RE | Admit: 2014-06-01 | Discharge: 2014-06-01 | Disposition: A | Discharge status: Home / Self Care | Payer: Medicare Other | Source: Ambulatory Visit | Attending: Family Medicine | Admitting: Family Medicine

## 2014-06-01 ENCOUNTER — Other Ambulatory Visit: Payer: Self-pay | Admitting: Family Medicine

## 2014-06-01 ENCOUNTER — Encounter: Payer: Self-pay | Admitting: Family Medicine

## 2014-06-01 VITALS — BP 142/68 | HR 52 | Temp 98.1°F | Ht 59.5 in | Wt 124.0 lb

## 2014-06-01 DIAGNOSIS — R229 Localized swelling, mass and lump, unspecified: Secondary | ICD-10-CM | POA: Diagnosis not present

## 2014-06-01 DIAGNOSIS — R7309 Other abnormal glucose: Secondary | ICD-10-CM

## 2014-06-01 DIAGNOSIS — R2231 Localized swelling, mass and lump, right upper limb: Secondary | ICD-10-CM

## 2014-06-01 DIAGNOSIS — Z23 Encounter for immunization: Secondary | ICD-10-CM

## 2014-06-01 DIAGNOSIS — R739 Hyperglycemia, unspecified: Secondary | ICD-10-CM

## 2014-06-01 DIAGNOSIS — I10 Essential (primary) hypertension: Secondary | ICD-10-CM

## 2014-06-01 DIAGNOSIS — K5900 Constipation, unspecified: Secondary | ICD-10-CM

## 2014-06-01 DIAGNOSIS — R609 Edema, unspecified: Secondary | ICD-10-CM

## 2014-06-01 DIAGNOSIS — E785 Hyperlipidemia, unspecified: Secondary | ICD-10-CM

## 2014-06-01 NOTE — Progress Notes (Signed)
Pre visit review using our clinic review tool, if applicable. No additional management support is needed unless otherwise documented below in the visit note. 

## 2014-06-01 NOTE — Patient Instructions (Addendum)
Call me if increased bruising, bleeding gums, blood in stool and blood in urine  Needs CBC in 3-4 weeks  Salon Pas Gel to finger to try and remove the ring  If itchy rash comes back continue cream but add a daily antihistamine such as Zyrtec or Claritin or their generics  For Bread more fiber and less carbohydrates per serving, consider Oatmeal breads Basic Carbohydrate Counting for Diabetes Mellitus Carbohydrate counting is a method for keeping track of the amount of carbohydrates you eat. Eating carbohydrates naturally increases the level of sugar (glucose) in your blood, so it is important for you to know the amount that is okay for you to have in every meal. Carbohydrate counting helps keep the level of glucose in your blood within normal limits. The amount of carbohydrates allowed is different for every person. A dietitian can help you calculate the amount that is right for you. Once you know the amount of carbohydrates you can have, you can count the carbohydrates in the foods you want to eat. Carbohydrates are found in the following foods:  Grains, such as breads and cereals.  Dried beans and soy products.  Starchy vegetables, such as potatoes, peas, and corn.  Fruit and fruit juices.  Milk and yogurt.  Sweets and snack foods, such as cake, cookies, candy, chips, soft drinks, and fruit drinks. CARBOHYDRATE COUNTING There are two ways to count the carbohydrates in your food. You can use either of the methods or a combination of both. Reading the "Nutrition Facts" on [place] The "Nutrition Facts" is an area that is included on the labels of almost all packaged food and beverages in the [place]. It includes the serving size of that food or beverage and information about the nutrients in each serving of the food, including the grams (g) of carbohydrate per serving.  Decide the number of servings of this food or beverage that you will be able to eat or drink. Multiply that  number of servings by the number of grams of carbohydrate that is listed on the label for that serving. The total will be the amount of carbohydrates you will be having when you eat or drink this food or beverage. Learning Standard Serving Sizes of Food When you eat food that is not packaged or does not include "Nutrition Facts" on the label, you need to measure the servings in order to count the amount of carbohydrates.A serving of most carbohydrate-rich foods contains about 15 g of carbohydrates. The following list includes serving sizes of carbohydrate-rich foods that provide 15 g ofcarbohydrate per serving:   1 slice of bread (1 oz) or 1 six-inch tortilla.    of a hamburger bun or English muffin.  4-6 crackers.   cup unsweetened dry cereal.    cup hot cereal.   cup rice or pasta.    cup mashed potatoes or  of a large baked potato.  1 cup fresh fruit or one small piece of fruit.    cup canned or frozen fruit or fruit juice.  1 cup milk.   cup plain fat-free yogurt or yogurt sweetened with artificial sweeteners.   cup cooked dried beans or starchy vegetable, such as peas, corn, or potatoes.  Decide the number of standard-size servings that you will eat. Multiply that number of servings by 15 (the grams of carbohydrates in that serving). For example, if you eat 2 cups of strawberries, you will have eaten 2 servings and 30 g of carbohydrates (2 servings x  15 g = 30 g). For foods such as soups and casseroles, in which more than one food is mixed in, you will need to count the carbohydrates in each food that is included. EXAMPLE OF CARBOHYDRATE COUNTING Sample Dinner  3 oz chicken breast.   cup of brown rice.   cup of corn.  1 cup milk.   1 cup strawberries with sugar-free whipped topping.  Carbohydrate Calculation Step 1: Identify the foods that contain carbohydrates:   Rice.   Corn.   Milk.   Strawberries. Step 2:Calculate the number of  servings eaten of each:   2 servings of rice.   1 serving of corn.   1 serving of milk.   1 serving of strawberries. Step 3: Multiply each of those number of servings by 15 g:   2 servings of rice x 15 g = 30 g.   1 serving of corn x 15 g = 15 g.   1 serving of milk x 15 g = 15 g.   1 serving of strawberries x 15 g = 15 g. Step 4: Add together all of the amounts to find the total grams of carbohydrates eaten: 30 g + 15 g + 15 g + 15 g = 75 g. Document Released: 09/21/2005 Document Revised: 02/05/2014 Document Reviewed: 08/18/2013 [facility] Patient Information 2015 [place], [place]. This information is not intended to replace advice given to you by your health care provider. Make sure you discuss any questions you have with your health care provider.    Thrombocytopenia Thrombocytopenia is a condition in which there is an abnormally small number of platelets in your blood. Platelets are also called thrombocytes. Platelets are needed for blood clotting. CAUSES Thrombocytopenia is caused by:   Decreased production of platelets. This can be caused by:  Aplastic anemia in which your bone marrow quits making blood cells.  Cancer in the bone marrow.  Use of certain medicines, including chemotherapy.  Infection in the bone marrow.  Heavy alcohol consumption.  Increased destruction of platelets. This can be caused by:  Certain immune diseases.  Use of certain drugs.  Certain blood clotting disorders.  Certain inherited disorders.  Certain bleeding disorders.  Pregnancy.  Having an enlarged spleen (hypersplenism). In hypersplenism, the spleen gathers up platelets from circulation. This means the platelets are not available to help with blood clotting. The spleen can enlarge due to cirrhosis or other conditions. SYMPTOMS  The symptoms of thrombocytopenia are side effects of poor blood clotting. Some of these are:  Abnormal bleeding.  Nosebleeds.  Heavy  menstrual periods.  Blood in the urine or stools.  Purpura. This is a purplish discoloration in the skin produced by small bleeding vessels near the surface of the skin.  Bruising.  A rash that may be petechial. This looks like pinpoint, purplish-red spots on the skin and mucous membranes. It is caused by bleeding from small blood vessels (capillaries). DIAGNOSIS  Your caregiver will make this diagnosis based on your exam and blood tests. Sometimes, a bone marrow study is done to look for the original cells (megakaryocytes) that make platelets. TREATMENT  Treatment depends on the cause of the condition.  Medicines may be given to help protect your platelets from being destroyed.  In some cases, a replacement (transfusion) of platelets may be required to stop or prevent bleeding.  Sometimes, the spleen must be surgically removed. HOME CARE INSTRUCTIONS   Check the skin and linings inside your mouth for bruising or bleeding as directed by  your caregiver.  Check your sputum, urine, and stool for blood as directed by your caregiver.  Do not return to any activities that could cause bumps or bruises until your caregiver says it is okay.  Take extra care not to cut yourself when shaving or when using scissors, needles, knives, and other tools.  Take extra care not to burn yourself when ironing or cooking.  Ask your caregiver if it is okay for you to drink alcohol.  Only take over-the-counter or prescription medicines as directed by your caregiver.  Notify all your caregivers, including dentists and eye doctors, about your condition. SEEK IMMEDIATE MEDICAL CARE IF:   You develop active bleeding from anywhere in your body.  You develop unexplained bruising or bleeding.  You have blood in your sputum, urine, or stool. MAKE SURE YOU:  Understand these instructions.  Will watch your condition.  Will get help right away if you are not doing well or get worse. Document Released:  09/21/2005 Document Revised: 12/14/2011 Document Reviewed: 07/24/2011 [facility] Patient Information 2015 [place], [place]. This information is not intended to replace advice given to you by your health care provider. Make sure you discuss any questions you have with your health care provider.

## 2014-06-03 ENCOUNTER — Encounter: Payer: Self-pay | Admitting: Family Medicine

## 2014-06-03 NOTE — Assessment & Plan Note (Signed)
Encouraged increased hydration and fiber in diet. Daily probiotics. If bowels not moving can use MOM 2 tbls po in 4 oz of warm prune juice by mouth every 2-3 days. If no results then repeat in 4 hours with  Dulcolax suppository pr, may repeat again in 4 more hours as needed. Seek care if symptoms worsen. Consider daily Miralax and/or Dulcolax if symptoms persist.  

## 2014-06-03 NOTE — Assessment & Plan Note (Signed)
Well controlled, no changes to meds. Encouraged heart healthy diet such as the DASH diet and exercise as tolerated.  °

## 2014-06-03 NOTE — Assessment & Plan Note (Signed)
hgba1c acceptable, minimize simple carbs. Increase exercise as tolerated.  

## 2014-06-03 NOTE — Assessment & Plan Note (Signed)
Encouraged heart healthy diet, increase exercise, avoid trans fats, consider a krill oil cap daily 

## 2014-06-03 NOTE — Progress Notes (Signed)
Patient ID: Amber Morgan, female   DOB: [birth year], 80 y.o.   MRN: [id number] GICELA SCHWARTING [id number] 03-Apr-1927 06/03/2014      Progress Note-Follow Up  Subjective  Chief Complaint  Chief Complaint  Patient presents with  . Follow-up    4 month    HPI  Patient is a 80 year old female in today for routine medical care. The patient is in today for followup. Feels well. No recent illness and has been eating a heart healthy diet staying active. Denies CP/palp/SOB/HA/congestion/fevers/GI or GU c/o. Taking meds as prescribed  Past Medical History  Diagnosis Date  . History of chicken pox     childhood  . Diverticulitis   . HTN (hypertension)   . Colon polyps   . Urinary incontinence   . Unspecified constipation 02/06/2013  . Nocturia 02/08/2013  . Viral infection characterized by skin and mucous membrane lesions 05/11/2013  . Personal history of colonic polyps 09/05/2013  . Hyperglycemia 09/05/2013  . Diverticulosis 01/04/2014    Past Surgical History  Procedure Laterality Date  . Eye surgery      multiple bilateral  . Bladder surgery  2003    bladder tact  . Abdominal hysterectomy      Family History  Problem Relation Age of Onset  . Colon cancer Mother   . Hypertension Mother   . Heart disease Father   . Meniere's disease Father   . Heart disease Sister   . Arthritis Son     back  . Glaucoma Son   . Hypertension Son   . Cataracts Son   . Colon cancer Sister   . Alzheimer's disease Sister   . Glaucoma Sister   . Glaucoma Sister   . Hyperlipidemia Sister   . Hypertension Sister   . Cancer Sister     thyroid  . Diabetes Sister   . Glaucoma Sister     History   Social History  . Marital Status: Married    Spouse Name: N/A    Number of Children: N/A  . Years of Education: N/A   Occupational History  . Not on file.   Social History Main Topics  . Smoking status: Never Smoker   . Smokeless tobacco: Never Used  . Alcohol Use: No  . Drug Use: Not on file   . Sexual Activity: Not on file   Other Topics Concern  . Not on file   Social History Narrative  . No narrative on file    Current Outpatient Prescriptions on File Prior to Visit  Medication Sig Dispense Refill  . atenolol (TENORMIN) 25 MG tablet Take 1 tablet (25 mg total) by mouth daily.  90 tablet  1  . hydrochlorothiazide (HYDRODIURIL) 25 MG tablet Take 1 tablet (25 mg total) by mouth daily.  90 tablet  1  . Multiple Vitamins-Minerals (ONE-A-DAY WOMENS 50 PLUS PO) Take 1 each by mouth daily.      . Omega-3 Fatty Acids (FISH OIL PO) Take by mouth daily.      . Polyethylene Glycol 3350 (MIRALAX PO) Take by mouth. Alternates with benefiber      . Propylene Glycol (SYSTANE BALANCE OP) Apply 1 drop to eye daily as needed.      . timolol (BETIMOL) 0.5 % ophthalmic solution Place 1 drop into the left eye 2 (two) times daily.      Marland Kitchen triamcinolone ointment (KENALOG) 0.1 % Apply 1 application topically 2 (two) times daily. To affected areas  60 g  6   No current facility-administered medications on file prior to visit.    No Known Allergies  Review of Systems  Review of Systems  Constitutional: Negative for fever and malaise/fatigue.  HENT: Negative for congestion.   Eyes: Negative for discharge.  Respiratory: Negative for shortness of breath.   Cardiovascular: Negative for chest pain, palpitations and leg swelling.  Gastrointestinal: Negative for nausea, abdominal pain and diarrhea.  Genitourinary: Negative for dysuria.  Musculoskeletal: Negative for falls.  Skin: Negative for rash.  Neurological: Negative for loss of consciousness and headaches.  Endo/Heme/Allergies: Negative for polydipsia.  Psychiatric/Behavioral: Negative for depression and suicidal ideas. The patient is not nervous/anxious and does not have insomnia.     Objective  BP 142/68  Pulse 52  Temp(Src) 98.1 F (36.7 C) (Oral)  Ht 4' 11.5" (1.511 m)  Wt 124 lb 0.6 oz (56.264 kg)  BMI 24.64 kg/m2  SpO2  98%  Physical Exam  Physical Exam  Constitutional: She is oriented to person, place, and time and well-developed, well-nourished, and in no distress. No distress.  HENT:  Head: Normocephalic and atraumatic.  Eyes: Conjunctivae are normal.  Neck: Neck supple. No thyromegaly present.  Cardiovascular: Normal rate, regular rhythm and normal heart sounds.   No murmur heard. Pulmonary/Chest: Effort normal and breath sounds normal. She has no wheezes.  Abdominal: She exhibits no distension and no mass.  Musculoskeletal: She exhibits no edema.  Lymphadenopathy:    She has no cervical adenopathy.  Neurological: She is alert and oriented to person, place, and time.  Skin: Skin is warm and dry. No rash noted. She is not diaphoretic.  Psychiatric: Memory, affect and judgment normal.    Lab Results  Component Value Date   TSH 2.571 05/25/2014   Lab Results  Component Value Date   WBC 5.1 05/25/2014   HGB 13.4 05/25/2014   HCT 38.6 05/25/2014   MCV 90.8 05/25/2014   PLT 127* 05/25/2014   Lab Results  Component Value Date   CREATININE 0.79 05/25/2014   BUN 15 05/25/2014   NA 138 05/25/2014   K 4.4 05/25/2014   CL 100 05/25/2014   CO2 32 05/25/2014   Lab Results  Component Value Date   ALT 17 05/25/2014   AST 19 05/25/2014   ALKPHOS 55 05/25/2014   BILITOT 0.5 05/25/2014   Lab Results  Component Value Date   CHOL 173 05/25/2014   Lab Results  Component Value Date   HDL 59 05/25/2014   Lab Results  Component Value Date   LDLCALC 97 05/25/2014   Lab Results  Component Value Date   TRIG 87 05/25/2014   Lab Results  Component Value Date   CHOLHDL 2.9 05/25/2014     Assessment & Plan  HTN (hypertension) Well controlled, no changes to meds. Encouraged heart healthy diet such as the DASH diet and exercise as tolerated.   Hyperlipidemia Encouraged heart healthy diet, increase exercise, avoid trans fats, consider a krill oil cap daily  Unspecified constipation Encouraged increased  hydration and fiber in diet. Daily probiotics. If bowels not moving can use MOM 2 tbls po in 4 oz of warm prune juice by mouth every 2-3 days. If no results then repeat in 4 hours with  Dulcolax suppository pr, may repeat again in 4 more hours as needed. Seek care if symptoms worsen. Consider daily Miralax and/or Dulcolax if symptoms persist.   Hyperglycemia hgba1c acceptable, minimize simple carbs. Increase exercise as tolerated.

## 2014-06-04 ENCOUNTER — Telehealth: Payer: Self-pay

## 2014-06-04 NOTE — Telephone Encounter (Signed)
Amber Morgan  [phone]  [name] called back and would like for you to call her .

## 2014-06-05 ENCOUNTER — Telehealth: Payer: Self-pay | Admitting: Family Medicine

## 2014-06-05 ENCOUNTER — Other Ambulatory Visit: Payer: Self-pay | Admitting: Family Medicine

## 2014-06-05 DIAGNOSIS — R2231 Localized swelling, mass and lump, right upper limb: Secondary | ICD-10-CM

## 2014-06-05 NOTE — Telephone Encounter (Signed)
Caller name: Luis   Call back number:[phone]   Reason for call:  Pt called 8/31 about test results from 8/28.  Please call back.

## 2014-06-05 NOTE — Telephone Encounter (Signed)
Pt would like to be referred to have lump removed in upper right arm  Please advise?

## 2014-06-05 NOTE — Telephone Encounter (Signed)
I spoke to patient. Look at previous note

## 2014-06-29 ENCOUNTER — Other Ambulatory Visit (INDEPENDENT_AMBULATORY_CARE_PROVIDER_SITE_OTHER): Payer: Self-pay | Admitting: *Deleted

## 2014-06-29 ENCOUNTER — Other Ambulatory Visit (INDEPENDENT_AMBULATORY_CARE_PROVIDER_SITE_OTHER): Payer: Self-pay

## 2014-06-29 ENCOUNTER — Other Ambulatory Visit (INDEPENDENT_AMBULATORY_CARE_PROVIDER_SITE_OTHER): Payer: Medicare Other

## 2014-06-29 DIAGNOSIS — W57XXXA Bitten or stung by nonvenomous insect and other nonvenomous arthropods, initial encounter: Principal | ICD-10-CM

## 2014-06-29 DIAGNOSIS — T148 Other injury of unspecified body region: Secondary | ICD-10-CM

## 2014-06-29 DIAGNOSIS — R2231 Localized swelling, mass and lump, right upper limb: Secondary | ICD-10-CM

## 2014-06-29 LAB — CBC WITH DIFFERENTIAL/PLATELET
BASOS ABS: 0 10*3/uL (ref 0.0–0.1)
Basophils Relative: 0.5 % (ref 0.0–3.0)
EOS ABS: 0.1 10*3/uL (ref 0.0–0.7)
Eosinophils Relative: 2.3 % (ref 0.0–5.0)
HCT: 37 % (ref 36.0–46.0)
HEMOGLOBIN: 12.7 g/dL (ref 12.0–15.0)
Lymphocytes Relative: 31.7 % (ref 12.0–46.0)
Lymphs Abs: 1.6 10*3/uL (ref 0.7–4.0)
MCHC: 34.2 g/dL (ref 30.0–36.0)
MCV: 93.6 fl (ref 78.0–100.0)
MONOS PCT: 9.2 % (ref 3.0–12.0)
Monocytes Absolute: 0.5 10*3/uL (ref 0.1–1.0)
NEUTROS PCT: 56.3 % (ref 43.0–77.0)
Neutro Abs: 2.9 10*3/uL (ref 1.4–7.7)
Platelets: 234 10*3/uL (ref 150.0–400.0)
RBC: 3.96 Mil/uL (ref 3.87–5.11)
RDW: 13.1 % (ref 11.5–15.5)
WBC: 5.2 10*3/uL (ref 4.0–10.5)

## 2014-07-06 ENCOUNTER — Telehealth: Payer: Self-pay | Admitting: Family Medicine

## 2014-07-06 NOTE — Telephone Encounter (Signed)
Caller name: Desarae  Relation to pt: self  Call back number: [phone]   Reason for call: inquiring about lab results taken 06/29/2014

## 2014-07-06 NOTE — Telephone Encounter (Signed)
Pt informed of below messages  Notes Recorded by Varney Daily, RMA on 07/02/2014 at 2:30 PM Lab results mailed to patient  Notes Recorded by Mosie Lukes, MD on 06/30/2014 at 9:52 AM Notify looks good. All normal

## 2014-07-07 ENCOUNTER — Ambulatory Visit
Admission: RE | Admit: 2014-07-07 | Discharge: 2014-07-07 | Disposition: A | Discharge status: Home / Self Care | Payer: Medicare Other | Source: Ambulatory Visit | Attending: General Surgery | Admitting: General Surgery

## 2014-07-07 DIAGNOSIS — R2231 Localized swelling, mass and lump, right upper limb: Secondary | ICD-10-CM

## 2014-07-07 MED ORDER — GADOBENATE DIMEGLUMINE 529 MG/ML IV SOLN
10.0000 mL | Freq: Once | INTRAVENOUS | Status: AC | PRN
  Administered 2014-07-07: 10 mL via INTRAVENOUS

## 2014-08-24 ENCOUNTER — Other Ambulatory Visit (INDEPENDENT_AMBULATORY_CARE_PROVIDER_SITE_OTHER): Payer: Medicare Other

## 2014-08-24 DIAGNOSIS — E785 Hyperlipidemia, unspecified: Secondary | ICD-10-CM

## 2014-08-24 DIAGNOSIS — I10 Essential (primary) hypertension: Secondary | ICD-10-CM

## 2014-08-24 DIAGNOSIS — R739 Hyperglycemia, unspecified: Secondary | ICD-10-CM

## 2014-08-24 DIAGNOSIS — R609 Edema, unspecified: Secondary | ICD-10-CM

## 2014-08-24 LAB — LIPID PANEL
CHOLESTEROL: 158 mg/dL (ref 0–200)
HDL: 53.4 mg/dL (ref >39.00)
LDL Cholesterol: 93 mg/dL (ref 0–99)
NonHDL: 104.6
TRIGLYCERIDES: 60 mg/dL (ref 0.0–149.0)
Total CHOL/HDL Ratio: 3
VLDL: 12 mg/dL (ref 0.0–40.0)

## 2014-08-24 LAB — CBC WITH DIFFERENTIAL/PLATELET
BASOS PCT: 0.4 % (ref 0.0–3.0)
Basophils Absolute: 0 10*3/uL (ref 0.0–0.1)
EOS PCT: 2.4 % (ref 0.0–5.0)
Eosinophils Absolute: 0.1 10*3/uL (ref 0.0–0.7)
HCT: 36.7 % (ref 36.0–46.0)
Hemoglobin: 12.4 g/dL (ref 12.0–15.0)
LYMPHS ABS: 1.7 10*3/uL (ref 0.7–4.0)
Lymphocytes Relative: 32.2 % (ref 12.0–46.0)
MCHC: 33.8 g/dL (ref 30.0–36.0)
MCV: 93.6 fl (ref 78.0–100.0)
MONO ABS: 0.5 10*3/uL (ref 0.1–1.0)
Monocytes Relative: 9.3 % (ref 3.0–12.0)
Neutro Abs: 2.9 10*3/uL (ref 1.4–7.7)
Neutrophils Relative %: 55.7 % (ref 43.0–77.0)
PLATELETS: 213 10*3/uL (ref 150.0–400.0)
RBC: 3.92 Mil/uL (ref 3.87–5.11)
RDW: 12.6 % (ref 11.5–15.5)
WBC: 5.1 10*3/uL (ref 4.0–10.5)

## 2014-08-24 LAB — RENAL FUNCTION PANEL
Albumin: 4 g/dL (ref 3.5–5.2)
BUN: 24 mg/dL — ABNORMAL HIGH (ref 6–23)
CALCIUM: 9.5 mg/dL (ref 8.4–10.5)
CO2: 29 meq/L (ref 19–32)
Chloride: 99 mEq/L (ref 96–112)
Creatinine, Ser: 0.9 mg/dL (ref 0.4–1.2)
GFR: 65.75 mL/min (ref >60.00)
Glucose, Bld: 89 mg/dL (ref 70–99)
Phosphorus: 3.2 mg/dL (ref 2.3–4.6)
Potassium: 3.8 mEq/L (ref 3.5–5.1)
SODIUM: 135 meq/L (ref 135–145)

## 2014-08-24 LAB — TSH: TSH: 2.42 u[IU]/mL (ref 0.35–4.50)

## 2014-08-24 LAB — HEPATIC FUNCTION PANEL
ALBUMIN: 4 g/dL (ref 3.5–5.2)
ALT: 17 U/L (ref 0–35)
AST: 21 U/L (ref 0–37)
Alkaline Phosphatase: 48 U/L (ref 39–117)
Bilirubin, Direct: 0.1 mg/dL (ref 0.0–0.3)
TOTAL PROTEIN: 6.5 g/dL (ref 6.0–8.3)
Total Bilirubin: 0.7 mg/dL (ref 0.2–1.2)

## 2014-08-24 LAB — HEMOGLOBIN A1C: HEMOGLOBIN A1C: 6.2 % (ref 4.6–6.5)

## 2014-09-03 ENCOUNTER — Ambulatory Visit (INDEPENDENT_AMBULATORY_CARE_PROVIDER_SITE_OTHER): Payer: Medicare Other | Admitting: Family Medicine

## 2014-09-03 ENCOUNTER — Encounter: Payer: Self-pay | Admitting: Family Medicine

## 2014-09-03 VITALS — BP 168/60 | HR 55 | Temp 97.4°F | Ht 59.5 in | Wt 121.8 lb

## 2014-09-03 DIAGNOSIS — K5900 Constipation, unspecified: Secondary | ICD-10-CM

## 2014-09-03 DIAGNOSIS — E785 Hyperlipidemia, unspecified: Secondary | ICD-10-CM

## 2014-09-03 DIAGNOSIS — R739 Hyperglycemia, unspecified: Secondary | ICD-10-CM

## 2014-09-03 DIAGNOSIS — I10 Essential (primary) hypertension: Secondary | ICD-10-CM

## 2014-09-03 MED ORDER — ATENOLOL 25 MG PO TABS: 12.5000 mg | ORAL_TABLET | Freq: Two times a day (BID) | ORAL | Status: DC

## 2014-09-03 NOTE — Patient Instructions (Signed)

## 2014-09-03 NOTE — Assessment & Plan Note (Signed)
hgba1c acceptable, minimize simple carbs. Increase exercise as tolerated.  

## 2014-09-03 NOTE — Assessment & Plan Note (Addendum)
Elevated here and better at home in the 130s to 140s at home will try Atenolol at 12.5 mg po bid and report any concerning symptoms

## 2014-09-03 NOTE — Progress Notes (Signed)
Pre visit review using our clinic review tool, if applicable. No additional management support is needed unless otherwise documented below in the visit note. 

## 2014-09-03 NOTE — Assessment & Plan Note (Signed)
Encouraged heart healthy diet, increase exercise, avoid trans fats, consider a krill oil cap daily 

## 2014-09-03 NOTE — Progress Notes (Signed)
Amber Morgan  [id number] Sep 27, 1927 09/03/2014      Progress Note-Follow Up  Subjective  Chief Complaint  Chief Complaint  Patient presents with  . Follow-up    3 month    HPI  Patient is a 80 y.o. female in today for routine medical care. Doing well. No recent illness. No chest pain or palpitations. No acute concerns. Denies CP/palp/SOB/HA/congestion/fevers/GI or GU c/o. Taking meds as prescribed  Past Medical History  Diagnosis Date  . History of chicken pox     childhood  . Diverticulitis   . HTN (hypertension)   . Colon polyps   . Urinary incontinence   . Unspecified constipation 02/06/2013  . Nocturia 02/08/2013  . Viral infection characterized by skin and mucous membrane lesions 05/11/2013  . Personal history of colonic polyps 09/05/2013  . Hyperglycemia 09/05/2013  . Diverticulosis 01/04/2014    Past Surgical History  Procedure Laterality Date  . Eye surgery      multiple bilateral  . Bladder surgery  2003    bladder tact  . Abdominal hysterectomy      Family History  Problem Relation Age of Onset  . Colon cancer Mother   . Hypertension Mother   . Heart disease Father   . Meniere's disease Father   . Heart disease Sister   . Arthritis Son     back  . Glaucoma Son   . Hypertension Son   . Cataracts Son   . Colon cancer Sister   . Alzheimer's disease Sister   . Glaucoma Sister   . Glaucoma Sister   . Hyperlipidemia Sister   . Hypertension Sister   . Cancer Sister     thyroid  . Diabetes Sister   . Glaucoma Sister     History   Social History  . Marital Status: Married    Spouse Name: N/A    Number of Children: N/A  . Years of Education: N/A   Occupational History  . Not on file.   Social History Main Topics  . Smoking status: Never Smoker   . Smokeless tobacco: Never Used  . Alcohol Use: No  . Drug Use: Not on file  . Sexual Activity: Not on file   Other Topics Concern  . Not on file   Social History Narrative    Current  Outpatient Prescriptions on File Prior to Visit  Medication Sig Dispense Refill  . atenolol (TENORMIN) 25 MG tablet Take 1 tablet (25 mg total) by mouth daily. 90 tablet 1  . hydrochlorothiazide (HYDRODIURIL) 25 MG tablet Take 1 tablet (25 mg total) by mouth daily. 90 tablet 1  . Multiple Vitamins-Minerals (ONE-A-DAY WOMENS 50 PLUS PO) Take 1 each by mouth daily.    . Omega-3 Fatty Acids (FISH OIL PO) Take by mouth daily.    . Polyethylene Glycol 3350 (MIRALAX PO) Take by mouth.     . Propylene Glycol (SYSTANE BALANCE OP) Apply 1 drop to eye daily as needed.    . timolol (BETIMOL) 0.5 % ophthalmic solution Place 1 drop into the left eye 2 (two) times daily.    Marland Kitchen triamcinolone ointment (KENALOG) 0.1 % Apply 1 application topically 2 (two) times daily. To affected areas 60 g 6   No current facility-administered medications on file prior to visit.    No Known Allergies  Review of Systems  Review of Systems  Constitutional: Negative for fever and malaise/fatigue.  HENT: Negative for congestion.   Eyes: Negative for discharge.  Respiratory: Negative  for shortness of breath.   Cardiovascular: Negative for chest pain, palpitations and leg swelling.  Gastrointestinal: Negative for nausea, abdominal pain and diarrhea.  Genitourinary: Negative for dysuria.  Musculoskeletal: Negative for falls.  Skin: Negative for rash.  Neurological: Negative for loss of consciousness and headaches.  Endo/Heme/Allergies: Negative for polydipsia.  Psychiatric/Behavioral: Negative for depression and suicidal ideas. The patient is not nervous/anxious and does not have insomnia.     Objective  BP 168/60 mmHg  Pulse 55  Temp(Src) 97.4 F (36.3 C) (Oral)  Ht 4' 11.5" (1.511 m)  Wt 121 lb 12.8 oz (55.248 kg)  BMI 24.20 kg/m2  SpO2 100%  Physical Exam  Physical Exam  Constitutional: She is oriented to person, place, and time and well-developed, well-nourished, and in no distress. No distress.  HENT:    Head: Normocephalic and atraumatic.  Eyes: Conjunctivae are normal.  Neck: Neck supple. No thyromegaly present.  Cardiovascular: Normal rate, regular rhythm and normal heart sounds.   No murmur heard. Pulmonary/Chest: Effort normal and breath sounds normal. She has no wheezes.  Abdominal: She exhibits no distension and no mass.  Musculoskeletal: She exhibits no edema.  Lymphadenopathy:    She has no cervical adenopathy.  Neurological: She is alert and oriented to person, place, and time.  Skin: Skin is warm and dry. No rash noted. She is not diaphoretic.  Psychiatric: Memory, affect and judgment normal.    Lab Results  Component Value Date   TSH 2.42 08/24/2014   Lab Results  Component Value Date   WBC 5.1 08/24/2014   HGB 12.4 08/24/2014   HCT 36.7 08/24/2014   MCV 93.6 08/24/2014   PLT 213.0 08/24/2014   Lab Results  Component Value Date   CREATININE 0.9 08/24/2014   BUN 24* 08/24/2014   NA 135 08/24/2014   K 3.8 08/24/2014   CL 99 08/24/2014   CO2 29 08/24/2014   Lab Results  Component Value Date   ALT 17 08/24/2014   AST 21 08/24/2014   ALKPHOS 48 08/24/2014   BILITOT 0.7 08/24/2014   Lab Results  Component Value Date   CHOL 158 08/24/2014   Lab Results  Component Value Date   HDL 53.40 08/24/2014   Lab Results  Component Value Date   LDLCALC 93 08/24/2014   Lab Results  Component Value Date   TRIG 60.0 08/24/2014   Lab Results  Component Value Date   CHOLHDL 3 08/24/2014     Assessment & Plan  Constipation No trouble at time.   Hyperlipidemia Encouraged heart healthy diet, increase exercise, avoid trans fats, consider a krill oil cap daily  Hyperglycemia hgba1c acceptable, minimize simple carbs. Increase exercise as tolerated.   HTN (hypertension) Elevated here and better at home in the 130s to 140s at home will try Atenolol at 12.5 mg po bid and report any concerning symptoms

## 2014-09-03 NOTE — Assessment & Plan Note (Signed)
No trouble at time.

## 2014-10-17 ENCOUNTER — Ambulatory Visit (INDEPENDENT_AMBULATORY_CARE_PROVIDER_SITE_OTHER): Payer: Medicare Other | Admitting: Medical

## 2014-10-17 ENCOUNTER — Encounter: Payer: Self-pay | Admitting: Medical

## 2014-10-17 VITALS — BP 150/70 | HR 61 | Temp 97.5°F | Ht 59.5 in | Wt 128.6 lb

## 2014-10-17 DIAGNOSIS — N3000 Acute cystitis without hematuria: Secondary | ICD-10-CM

## 2014-10-17 DIAGNOSIS — N390 Urinary tract infection, site not specified: Secondary | ICD-10-CM | POA: Insufficient documentation

## 2014-10-17 LAB — POCT URINALYSIS DIPSTICK
Bilirubin, UA: NEGATIVE
Blood, UA: NEGATIVE
GLUCOSE UA: NEGATIVE
Ketones, UA: NEGATIVE
NITRITE UA: POSITIVE
Spec Grav, UA: 1.01
UROBILINOGEN UA: 0.2
pH, UA: 6

## 2014-10-17 MED ORDER — CIPROFLOXACIN HCL 500 MG PO TABS: 500.0000 mg | ORAL_TABLET | Freq: Two times a day (BID) | ORAL | Status: DC

## 2014-10-17 NOTE — Addendum Note (Signed)
Addended by: Bunnie Domino on: 10/17/2014 01:56 PM   Modules accepted: Orders

## 2014-10-17 NOTE — Assessment & Plan Note (Signed)
Your appear to have a urinary tract infection. I am prescribing cipro  antibiotic for the probable infection. Hydrate well. I am sending out a urine culture. During the interim if your signs and symptoms worsen rather than improving please notify us. We will notify your when the culture results are back.

## 2014-10-17 NOTE — Progress Notes (Signed)
Pre visit review using our clinic review tool, if applicable. No additional management support is needed unless otherwise documented below in the visit note. 

## 2014-10-17 NOTE — Patient Instructions (Signed)
Your appear to have a urinary tract infection. I am prescribing cipro  antibiotic for the probable infection. Hydrate well. I am sending out a urine culture. During the interim if your signs and symptoms worsen rather than improving please notify us. We will notify your when the culture results are back.  Follow up in 7 days or as needed. 

## 2014-10-17 NOTE — Progress Notes (Signed)
Subjective:    Patient ID: Amber Morgan, female    DOB: 09-24-1927, 81 y.o.   MRN: [id number]  HPI   Pt in today reporting urinary symptoms. Pt states usually urinates frequently. Last night went twice. This am went twice. Smaller volumes this am. Urine more  concentrated since yesterday. No odor that she can tell.  Dysuria- no Frequent urination- yes although she states  Smaller volumes. Hesitancy-yes Suprapubic pressure-No Fever-no chills-no Nausea-no Vomiting-no CVA pain-no now but some rt side last week. History of UTI-1-2 at most in her life Gross hematuria-no  Past Medical History  Diagnosis Date  . History of chicken pox     childhood  . Diverticulitis   . HTN (hypertension)   . Colon polyps   . Urinary incontinence   . Unspecified constipation 02/06/2013  . Nocturia 02/08/2013  . Viral infection characterized by skin and mucous membrane lesions 05/11/2013  . Personal history of colonic polyps 09/05/2013  . Hyperglycemia 09/05/2013  . Diverticulosis 01/04/2014    History   Social History  . Marital Status: Married    Spouse Name: N/A    Number of Children: N/A  . Years of Education: N/A   Occupational History  . Not on file.   Social History Main Topics  . Smoking status: Never Smoker   . Smokeless tobacco: Never Used  . Alcohol Use: No  . Drug Use: Not on file  . Sexual Activity: Not on file   Other Topics Concern  . Not on file   Social History Narrative    Past Surgical History  Procedure Laterality Date  . Eye surgery      multiple bilateral  . Bladder surgery  2003    bladder tact  . Abdominal hysterectomy      Family History  Problem Relation Age of Onset  . Colon cancer Mother   . Hypertension Mother   . Heart disease Father   . Meniere's disease Father   . Heart disease Sister   . Arthritis Son     back  . Glaucoma Son   . Hypertension Son   . Cataracts Son   . Colon cancer Sister   . Alzheimer's disease Sister   . Glaucoma  Sister   . Glaucoma Sister   . Hyperlipidemia Sister   . Hypertension Sister   . Cancer Sister     thyroid  . Diabetes Sister   . Glaucoma Sister     No Known Allergies  Current Outpatient Prescriptions on File Prior to Visit  Medication Sig Dispense Refill  . atenolol (TENORMIN) 25 MG tablet Take 0.5 tablets (12.5 mg total) by mouth 2 (two) times daily. 90 tablet 1  . hydrochlorothiazide (HYDRODIURIL) 25 MG tablet Take 1 tablet (25 mg total) by mouth daily. 90 tablet 1  . Multiple Vitamins-Minerals (ONE-A-DAY WOMENS 50 PLUS PO) Take 1 each by mouth daily.    . Omega-3 Fatty Acids (FISH OIL PO) Take by mouth daily.    . Polyethylene Glycol 3350 (MIRALAX PO) Take by mouth.     . Probiotic Product (PROBIOTIC DAILY PO) Take by mouth.    . Propylene Glycol (SYSTANE BALANCE OP) Apply 1 drop to eye daily as needed.    . timolol (BETIMOL) 0.5 % ophthalmic solution Place 1 drop into the left eye 2 (two) times daily.    Marland Kitchen triamcinolone ointment (KENALOG) 0.1 % Apply 1 application topically 2 (two) times daily. To affected areas 60 g 6   No  current facility-administered medications on file prior to visit.    BP 150/70 mmHg  Pulse 61  Temp(Src) 97.5 F (36.4 C) (Oral)  Ht 4' 11.5" (1.511 m)  Wt 128 lb 9.6 oz (58.333 kg)  BMI 25.55 kg/m2  SpO2 98%      Review of Systems  Constitutional: Negative for fever, chills and fatigue.  Respiratory: Negative for cough, chest tightness, shortness of breath and wheezing.   Cardiovascular: Negative for chest pain and palpitations.  Gastrointestinal: Negative for nausea, vomiting and abdominal pain.  Genitourinary: Positive for urgency and frequency. Negative for dysuria, hematuria, flank pain, decreased urine volume, vaginal bleeding, vaginal discharge, vaginal pain, menstrual problem and pelvic pain.  Neurological: Negative for dizziness, light-headedness and headaches.  Hematological: Negative for adenopathy. Does not bruise/bleed easily.        Objective:   Physical Exam   General Appearance- Not in acute distress.  HEENT Eyes- Scleraeral/Conjuntiva-bilat- Not Yellow. Mouth & Throat- Normal.  Chest and Lung Exam Auscultation: Breath sounds:-Normal. Adventitious sounds:- No Adventitious sounds.  Cardiovascular Auscultation:Rythm - Regular. Heart Sounds -Normal heart sounds.  Abdomen Inspection:-Inspection Normal.  Palpation/Perucssion: Palpation and Percussion of the abdomen reveal- faint minimal suprapubic Tender, No Rebound tenderness, No rigidity(Guarding) and No Palpable abdominal masses.  Liver:-Normal.  Spleen:- Normal.   Back- no cva pain.   Neurologic-  Cn III- XII grossly intact        Assessment & Plan:

## 2014-10-19 LAB — URINE CULTURE: Colony Count: >=100000

## 2014-11-25 ENCOUNTER — Other Ambulatory Visit: Payer: Self-pay | Admitting: Family Medicine

## 2014-12-04 ENCOUNTER — Other Ambulatory Visit (INDEPENDENT_AMBULATORY_CARE_PROVIDER_SITE_OTHER): Payer: Medicare Other

## 2014-12-04 DIAGNOSIS — K5900 Constipation, unspecified: Secondary | ICD-10-CM | POA: Diagnosis not present

## 2014-12-04 DIAGNOSIS — E785 Hyperlipidemia, unspecified: Secondary | ICD-10-CM

## 2014-12-04 DIAGNOSIS — R739 Hyperglycemia, unspecified: Secondary | ICD-10-CM

## 2014-12-04 DIAGNOSIS — I10 Essential (primary) hypertension: Secondary | ICD-10-CM | POA: Diagnosis not present

## 2014-12-04 LAB — CBC
HCT: 37.6 % (ref 36.0–46.0)
HEMOGLOBIN: 12.6 g/dL (ref 12.0–15.0)
MCHC: 33.6 g/dL (ref 30.0–36.0)
MCV: 93.6 fl (ref 78.0–100.0)
PLATELETS: 232 10*3/uL (ref 150.0–400.0)
RBC: 4.02 Mil/uL (ref 3.87–5.11)
RDW: 12.8 % (ref 11.5–15.5)
WBC: 5.3 10*3/uL (ref 4.0–10.5)

## 2014-12-04 LAB — HEPATIC FUNCTION PANEL
ALBUMIN: 4 g/dL (ref 3.5–5.2)
ALT: 14 U/L (ref 0–35)
AST: 18 U/L (ref 0–37)
Alkaline Phosphatase: 57 U/L (ref 39–117)
Bilirubin, Direct: 0.1 mg/dL (ref 0.0–0.3)
Total Bilirubin: 0.5 mg/dL (ref 0.2–1.2)
Total Protein: 6.5 g/dL (ref 6.0–8.3)

## 2014-12-04 LAB — RENAL FUNCTION PANEL
ALBUMIN: 4 g/dL (ref 3.5–5.2)
BUN: 15 mg/dL (ref 6–23)
CALCIUM: 9.7 mg/dL (ref 8.4–10.5)
CO2: 33 mEq/L — ABNORMAL HIGH (ref 19–32)
CREATININE: 0.8 mg/dL (ref 0.40–1.20)
Chloride: 100 mEq/L (ref 96–112)
GFR: 72.38 mL/min (ref >60.00)
GLUCOSE: 101 mg/dL — AB (ref 70–99)
Phosphorus: 3.1 mg/dL (ref 2.3–4.6)
Potassium: 4.4 mEq/L (ref 3.5–5.1)
Sodium: 135 mEq/L (ref 135–145)

## 2014-12-04 LAB — LIPID PANEL
CHOL/HDL RATIO: 3
Cholesterol: 151 mg/dL (ref 0–200)
HDL: 51.4 mg/dL (ref >39.00)
LDL Cholesterol: 87 mg/dL (ref 0–99)
NONHDL: 99.6
Triglycerides: 63 mg/dL (ref 0.0–149.0)
VLDL: 12.6 mg/dL (ref 0.0–40.0)

## 2014-12-04 LAB — HEMOGLOBIN A1C: HEMOGLOBIN A1C: 6.2 % (ref 4.6–6.5)

## 2014-12-04 LAB — TSH: TSH: 2.1 u[IU]/mL (ref 0.35–4.50)

## 2014-12-07 ENCOUNTER — Ambulatory Visit (INDEPENDENT_AMBULATORY_CARE_PROVIDER_SITE_OTHER): Payer: Medicare Other | Admitting: Family Medicine

## 2014-12-07 ENCOUNTER — Encounter: Payer: Self-pay | Admitting: Family Medicine

## 2014-12-07 VITALS — BP 130/62 | HR 65 | Temp 97.9°F | Ht 60.0 in | Wt 122.2 lb

## 2014-12-07 DIAGNOSIS — I10 Essential (primary) hypertension: Secondary | ICD-10-CM

## 2014-12-07 DIAGNOSIS — E782 Mixed hyperlipidemia: Secondary | ICD-10-CM

## 2014-12-07 DIAGNOSIS — Z780 Asymptomatic menopausal state: Secondary | ICD-10-CM

## 2014-12-07 DIAGNOSIS — N390 Urinary tract infection, site not specified: Secondary | ICD-10-CM

## 2014-12-07 DIAGNOSIS — R739 Hyperglycemia, unspecified: Secondary | ICD-10-CM

## 2014-12-07 DIAGNOSIS — D1720 Benign lipomatous neoplasm of skin and subcutaneous tissue of unspecified limb: Secondary | ICD-10-CM

## 2014-12-07 DIAGNOSIS — E785 Hyperlipidemia, unspecified: Secondary | ICD-10-CM

## 2014-12-07 LAB — URINALYSIS
Bilirubin Urine: NEGATIVE
HGB URINE DIPSTICK: NEGATIVE
KETONES UR: NEGATIVE
Leukocytes, UA: NEGATIVE
Nitrite: NEGATIVE
Specific Gravity, Urine: <=1.005 — AB (ref 1.000–1.030)
Total Protein, Urine: NEGATIVE
Urine Glucose: NEGATIVE
Urobilinogen, UA: 0.2 (ref 0.0–1.0)
pH: 6.5 (ref 5.0–8.0)

## 2014-12-07 NOTE — Patient Instructions (Signed)

## 2014-12-07 NOTE — Progress Notes (Signed)
Pre visit review using our clinic review tool, if applicable. No additional management support is needed unless otherwise documented below in the visit note. 

## 2014-12-08 LAB — URINE CULTURE
COLONY COUNT: NO GROWTH
Organism ID, Bacteria: NO GROWTH

## 2014-12-09 ENCOUNTER — Encounter: Payer: Self-pay | Admitting: Family Medicine

## 2014-12-09 DIAGNOSIS — D1720 Benign lipomatous neoplasm of skin and subcutaneous tissue of unspecified limb: Secondary | ICD-10-CM

## 2014-12-09 DIAGNOSIS — M8580 Other specified disorders of bone density and structure, unspecified site: Secondary | ICD-10-CM

## 2014-12-09 HISTORY — DX: Benign lipomatous neoplasm of skin and subcutaneous tissue of unspecified limb: D17.20

## 2014-12-09 HISTORY — DX: Other specified disorders of bone density and structure, unspecified site: M85.80

## 2014-12-09 NOTE — Assessment & Plan Note (Signed)
Encouraged heart healthy diet, increase exercise, avoid trans fats, consider a krill oil cap daily 

## 2014-12-09 NOTE — Assessment & Plan Note (Signed)
Bone density ordered today

## 2014-12-09 NOTE — Assessment & Plan Note (Signed)
Present for several months, not growing, nontender. She is offered a referral but chooses to wait since the lesion is likely benign and asymptomatic, she will notify us if it changes.

## 2014-12-09 NOTE — Assessment & Plan Note (Addendum)
hgba1c acceptable, minimize simple carbs. Increase exercise as tolerated.  

## 2014-12-09 NOTE — Assessment & Plan Note (Signed)
Repeat urine culture is negative

## 2014-12-09 NOTE — Assessment & Plan Note (Signed)
Well controlled, no changes to meds. Encouraged heart healthy diet such as the DASH diet and exercise as tolerated.  °

## 2014-12-09 NOTE — Progress Notes (Signed)
Amber Morgan  [id number] 21-Aug-1927 12/09/2014      Progress Note-Follow Up  Subjective  Chief Complaint  Chief Complaint  Patient presents with  . Follow-up    HPI  Patient is a 81 y.o. female in today for routine medical care. Patient in today for follow up. No recent illness, has been feeling well. Notes a lesion on her right arm for several months unchanged and asymptomatic. Denies CP/palp/SOB/HA/congestion/fevers/GI or GU c/o. Taking meds as prescribed  Past Medical History  Diagnosis Date  . History of chicken pox     childhood  . Diverticulitis   . HTN (hypertension)   . Colon polyps   . Urinary incontinence   . Unspecified constipation 02/06/2013  . Nocturia 02/08/2013  . Viral infection characterized by skin and mucous membrane lesions 05/11/2013  . Personal history of colonic polyps 09/05/2013  . Hyperglycemia 09/05/2013  . Diverticulosis 01/04/2014  . Lipoma of arm 12/09/2014    right    Past Surgical History  Procedure Laterality Date  . Eye surgery      multiple bilateral  . Bladder surgery  2003    bladder tact  . Abdominal hysterectomy      Family History  Problem Relation Age of Onset  . Colon cancer Mother   . Hypertension Mother   . Heart disease Father   . Meniere's disease Father   . Heart disease Sister   . Arthritis Son     back  . Glaucoma Son   . Hypertension Son   . Cataracts Son   . Colon cancer Sister   . Alzheimer's disease Sister   . Glaucoma Sister   . Glaucoma Sister   . Hyperlipidemia Sister   . Hypertension Sister   . Cancer Sister     thyroid  . Diabetes Sister   . Glaucoma Sister     History   Social History  . Marital Status: Married    Spouse Name: N/A  . Number of Children: N/A  . Years of Education: N/A   Occupational History  . Not on file.   Social History Main Topics  . Smoking status: Never Smoker   . Smokeless tobacco: Never Used  . Alcohol Use: No  . Drug Use: Not on file  . Sexual Activity: Not  on file   Other Topics Concern  . Not on file   Social History Narrative    Current Outpatient Prescriptions on File Prior to Visit  Medication Sig Dispense Refill  . atenolol (TENORMIN) 25 MG tablet Take 0.5 tablets (12.5 mg total) by mouth 2 (two) times daily. 90 tablet 1  . hydrochlorothiazide (HYDRODIURIL) 25 MG tablet TAKE ONE TABLET BY MOUTH ONCE DAILY 90 tablet 1  . Multiple Vitamins-Minerals (ONE-A-DAY WOMENS 50 PLUS PO) Take 1 each by mouth daily.    . Probiotic Product (PROBIOTIC DAILY PO) Take by mouth.    . Propylene Glycol (SYSTANE BALANCE OP) Apply 1 drop to eye daily as needed.    . timolol (BETIMOL) 0.5 % ophthalmic solution Place 1 drop into the left eye 2 (two) times daily.     No current facility-administered medications on file prior to visit.    No Known Allergies  Review of Systems  Review of Systems  Constitutional: Negative for fever and malaise/fatigue.  HENT: Negative for congestion.   Eyes: Negative for discharge.  Respiratory: Negative for shortness of breath.   Cardiovascular: Negative for chest pain, palpitations and leg swelling.  Gastrointestinal: Negative  for nausea, abdominal pain and diarrhea.  Genitourinary: Negative for dysuria.  Musculoskeletal: Negative for falls.  Skin: Negative for rash.  Neurological: Negative for loss of consciousness and headaches.  Endo/Heme/Allergies: Negative for polydipsia.  Psychiatric/Behavioral: Negative for depression and suicidal ideas. The patient is not nervous/anxious and does not have insomnia.     Objective  BP 130/62 mmHg  Pulse 65  Temp(Src) 97.9 F (36.6 C) (Oral)  Ht 5' (1.524 m)  Wt 122 lb 4 oz (55.452 kg)  BMI 23.88 kg/m2  SpO2 93%  Physical Exam  Physical Exam  Constitutional: She is oriented to person, place, and time and well-developed, well-nourished, and in no distress. No distress.  HENT:  Head: Normocephalic and atraumatic.  Eyes: Conjunctivae are normal.  Neck: Neck  supple. No thyromegaly present.  Cardiovascular: Normal rate, regular rhythm and normal heart sounds.   No murmur heard. Pulmonary/Chest: Effort normal and breath sounds normal. She has no wheezes.  Abdominal: She exhibits no distension and no mass.  Musculoskeletal: She exhibits no edema.  Lymphadenopathy:    She has no cervical adenopathy.  Neurological: She is alert and oriented to person, place, and time.  Skin: Skin is warm and dry. No rash noted. She is not diaphoretic.  Psychiatric: Memory, affect and judgment normal.    Lab Results  Component Value Date   TSH 2.10 12/04/2014   Lab Results  Component Value Date   WBC 5.3 12/04/2014   HGB 12.6 12/04/2014   HCT 37.6 12/04/2014   MCV 93.6 12/04/2014   PLT 232.0 12/04/2014   Lab Results  Component Value Date   CREATININE 0.80 12/04/2014   BUN 15 12/04/2014   NA 135 12/04/2014   K 4.4 12/04/2014   CL 100 12/04/2014   CO2 33* 12/04/2014   Lab Results  Component Value Date   ALT 14 12/04/2014   AST 18 12/04/2014   ALKPHOS 57 12/04/2014   BILITOT 0.5 12/04/2014   Lab Results  Component Value Date   CHOL 151 12/04/2014   Lab Results  Component Value Date   HDL 51.40 12/04/2014   Lab Results  Component Value Date   LDLCALC 87 12/04/2014   Lab Results  Component Value Date   TRIG 63.0 12/04/2014   Lab Results  Component Value Date   CHOLHDL 3 12/04/2014     Assessment & Plan  HTN (hypertension) Well controlled, no changes to meds. Encouraged heart healthy diet such as the DASH diet and exercise as tolerated.    UTI (urinary tract infection) Repeat urine culture is negative   Hyperlipidemia Encouraged heart healthy diet, increase exercise, avoid trans fats, consider a krill oil cap daily   Postmenopausal estrogen deficiency Bone density ordered today   Hyperglycemia hgba1c acceptable, minimize simple carbs. Increase exercise as tolerated.   Lipoma of arm Present for several months, not  growing, nontender. She is offered a referral but chooses to wait since the lesion is likely benign and asymptomatic, she will notify us if it changes.

## 2014-12-13 ENCOUNTER — Other Ambulatory Visit: Payer: Medicare Other

## 2014-12-21 ENCOUNTER — Ambulatory Visit (INDEPENDENT_AMBULATORY_CARE_PROVIDER_SITE_OTHER)
Admission: RE | Admit: 2014-12-21 | Discharge: 2014-12-21 | Disposition: A | Discharge status: Home / Self Care | Payer: Medicare Other | Source: Ambulatory Visit | Attending: Family Medicine | Admitting: Family Medicine

## 2014-12-21 DIAGNOSIS — Z780 Asymptomatic menopausal state: Secondary | ICD-10-CM

## 2015-01-01 ENCOUNTER — Other Ambulatory Visit: Payer: Medicare Other

## 2015-02-01 LAB — HM MAMMOGRAPHY

## 2015-02-12 ENCOUNTER — Encounter: Payer: Self-pay | Admitting: Family Medicine

## 2015-02-24 ENCOUNTER — Other Ambulatory Visit: Payer: Self-pay | Admitting: Family Medicine

## 2015-03-14 ENCOUNTER — Other Ambulatory Visit (INDEPENDENT_AMBULATORY_CARE_PROVIDER_SITE_OTHER): Payer: Medicare Other

## 2015-03-14 DIAGNOSIS — E782 Mixed hyperlipidemia: Secondary | ICD-10-CM | POA: Diagnosis not present

## 2015-03-14 DIAGNOSIS — Z780 Asymptomatic menopausal state: Secondary | ICD-10-CM

## 2015-03-14 DIAGNOSIS — I10 Essential (primary) hypertension: Secondary | ICD-10-CM

## 2015-03-14 DIAGNOSIS — R739 Hyperglycemia, unspecified: Secondary | ICD-10-CM | POA: Diagnosis not present

## 2015-03-14 DIAGNOSIS — N390 Urinary tract infection, site not specified: Secondary | ICD-10-CM

## 2015-03-14 LAB — LIPID PANEL
CHOLESTEROL: 147 mg/dL (ref 0–200)
HDL: 55.6 mg/dL (ref >39.00)
LDL Cholesterol: 80 mg/dL (ref 0–99)
NonHDL: 91.4
Total CHOL/HDL Ratio: 3
Triglycerides: 55 mg/dL (ref 0.0–149.0)
VLDL: 11 mg/dL (ref 0.0–40.0)

## 2015-03-14 LAB — COMPREHENSIVE METABOLIC PANEL
ALT: 19 U/L (ref 0–35)
AST: 20 U/L (ref 0–37)
Albumin: 3.9 g/dL (ref 3.5–5.2)
Alkaline Phosphatase: 56 U/L (ref 39–117)
BILIRUBIN TOTAL: 0.6 mg/dL (ref 0.2–1.2)
BUN: 14 mg/dL (ref 6–23)
CHLORIDE: 98 meq/L (ref 96–112)
CO2: 32 mEq/L (ref 19–32)
Calcium: 9.6 mg/dL (ref 8.4–10.5)
Creatinine, Ser: 0.8 mg/dL (ref 0.40–1.20)
GFR: 72.34 mL/min (ref >60.00)
GLUCOSE: 92 mg/dL (ref 70–99)
POTASSIUM: 4.1 meq/L (ref 3.5–5.1)
SODIUM: 134 meq/L — AB (ref 135–145)
TOTAL PROTEIN: 6.4 g/dL (ref 6.0–8.3)

## 2015-03-14 LAB — CBC
HEMATOCRIT: 36.6 % (ref 36.0–46.0)
Hemoglobin: 12.4 g/dL (ref 12.0–15.0)
MCHC: 33.9 g/dL (ref 30.0–36.0)
MCV: 94 fl (ref 78.0–100.0)
Platelets: 215 10*3/uL (ref 150.0–400.0)
RBC: 3.9 Mil/uL (ref 3.87–5.11)
RDW: 13.6 % (ref 11.5–15.5)
WBC: 5.4 10*3/uL (ref 4.0–10.5)

## 2015-03-14 LAB — TSH: TSH: 2.88 u[IU]/mL (ref 0.35–4.50)

## 2015-03-14 LAB — HEMOGLOBIN A1C: HEMOGLOBIN A1C: 5.9 % (ref 4.6–6.5)

## 2015-03-21 ENCOUNTER — Encounter: Payer: Self-pay | Admitting: Family Medicine

## 2015-03-21 ENCOUNTER — Ambulatory Visit (INDEPENDENT_AMBULATORY_CARE_PROVIDER_SITE_OTHER): Payer: Medicare Other | Admitting: Family Medicine

## 2015-03-21 VITALS — BP 150/62 | HR 57 | Temp 97.9°F | Ht 59.5 in | Wt 117.2 lb

## 2015-03-21 DIAGNOSIS — I10 Essential (primary) hypertension: Secondary | ICD-10-CM | POA: Diagnosis not present

## 2015-03-21 DIAGNOSIS — R739 Hyperglycemia, unspecified: Secondary | ICD-10-CM | POA: Diagnosis not present

## 2015-03-21 DIAGNOSIS — K5900 Constipation, unspecified: Secondary | ICD-10-CM

## 2015-03-21 DIAGNOSIS — M8580 Other specified disorders of bone density and structure, unspecified site: Secondary | ICD-10-CM | POA: Diagnosis not present

## 2015-03-21 DIAGNOSIS — E785 Hyperlipidemia, unspecified: Secondary | ICD-10-CM | POA: Diagnosis not present

## 2015-03-21 NOTE — Progress Notes (Signed)
Pre visit review using our clinic review tool, if applicable. No additional management support is needed unless otherwise documented below in the visit note. 

## 2015-03-21 NOTE — Patient Instructions (Signed)
Vitamin D roughly 2000 to 5000 IU daily, can add a vitamin D   Calcium Citrate (citracal) ( as opposed to calcium carbonate) absorbs best so recommend 1 tab of Citracal (calcium citrate plus Vitamin D) daily  Continue exercise regularly   Osteopenia/Osteoporosis Throughout your life, your body breaks down old bone and replaces it with new bone. As you get older, your body does not replace bone as quickly as it breaks it down. By the age of 34 years, most people begin to gradually lose bone because of the imbalance between bone loss and replacement. Some people lose more bone than others. Bone loss beyond a specified normal degree is considered osteoporosis.  Osteoporosis affects the strength and durability of your bones. The inside of the ends of your bones and your flat bones, like the bones of your pelvis, look like honeycomb, filled with tiny open spaces. As bone loss occurs, your bones become less dense. This means that the open spaces inside your bones become bigger and the walls between these spaces become thinner. This makes your bones weaker. Bones of a person with osteoporosis can become so weak that they can break (fracture) during minor accidents, such as a simple fall. CAUSES  The following factors have been associated with the development of osteoporosis:  Smoking.  Drinking more than 2 alcoholic drinks several days per week.  Long-term use of certain medicines:  Corticosteroids.  Chemotherapy medicines.  Thyroid medicines.  Antiepileptic medicines.  Gonadal hormone suppression medicine.  Immunosuppression medicine.  Being underweight.  Lack of physical activity.  Lack of exposure to the sun. This can lead to vitamin D deficiency.  Certain medical conditions:  Certain inflammatory bowel diseases, such as Crohn disease and ulcerative colitis.  Diabetes.  Hyperthyroidism.  Hyperparathyroidism. RISK FACTORS Anyone can develop osteoporosis. However, the  following factors can increase your risk of developing osteoporosis:  Gender--Women are at higher risk than men.  Age--Being older than 50 years increases your risk.  Ethnicity--White and Asian people have an increased risk.  Weight --Being extremely underweight can increase your risk of osteoporosis.  Family history of osteoporosis--Having a family member who has developed osteoporosis can increase your risk. SYMPTOMS  Usually, people with osteoporosis have no symptoms.  DIAGNOSIS  Signs during a physical exam that may prompt your caregiver to suspect osteoporosis include:  Decreased height. This is usually caused by the compression of the bones that form your spine (vertebrae) because they have weakened and become fractured.  A curving or rounding of the upper back (kyphosis). To confirm signs of osteoporosis, your caregiver may request a procedure that uses 2 low-dose X-ray beams with different levels of energy to measure your bone mineral density (dual-energy X-ray absorptiometry [DXA]). Also, your caregiver may check your level of vitamin D. TREATMENT  The goal of osteoporosis treatment is to strengthen bones in order to decrease the risk of bone fractures. There are different types of medicines available to help achieve this goal. Some of these medicines work by slowing the processes of bone loss. Some medicines work by increasing bone density. Treatment also involves making sure that your levels of calcium and vitamin D are adequate. PREVENTION  There are things you can do to help prevent osteoporosis. Adequate intake of calcium and vitamin D can help you achieve optimal bone mineral density. Regular exercise can also help, especially resistance and weight-bearing activities. If you smoke, quitting smoking is an important part of osteoporosis prevention. MAKE SURE YOU:  Understand these instructions.  Will watch your condition.  Will get help right away if you are not doing well  or get worse. FOR MORE INFORMATION www.osteo.org and [employer].ee Document Released: 07/01/2005 Document Revised: 01/16/2013 Document Reviewed: 09/05/2011 [facility] Patient Information 2015 [place], [place]. This information is not intended to replace advice given to you by your health care provider. Make sure you discuss any questions you have with your health care provider.

## 2015-03-24 ENCOUNTER — Encounter: Payer: Self-pay | Admitting: Family Medicine

## 2015-03-24 NOTE — Progress Notes (Signed)
Amber Morgan  [id number] Aug 06, 1927 03/24/2015      Progress Note-Follow Up  Subjective  Chief Complaint  Chief Complaint  Patient presents with  . Follow-up    3 month    HPI  Patient is a 81 y.o. female in today for routine medical care. Patient feeling well. No recent illness. No significant fatigue. Notes pulse in 40s to 60s, No acute concerns. Denies CP/palp/SOB/HA/congestion/fevers/GI or GU c/o. Taking meds as prescribed  Past Medical History  Diagnosis Date  . History of chicken pox     childhood  . Diverticulitis   . HTN (hypertension)   . Colon polyps   . Urinary incontinence   . Unspecified constipation 02/06/2013  . Nocturia 02/08/2013  . Viral infection characterized by skin and mucous membrane lesions 05/11/2013  . Personal history of colonic polyps 09/05/2013  . Hyperglycemia 09/05/2013  . Diverticulosis 01/04/2014  . Lipoma of arm 12/09/2014    right  . Osteopenia 12/09/2014    Past Surgical History  Procedure Laterality Date  . Eye surgery      multiple bilateral  . Bladder surgery  2003    bladder tact  . Abdominal hysterectomy      Family History  Problem Relation Age of Onset  . Colon cancer Mother   . Hypertension Mother   . Heart disease Father   . Meniere's disease Father   . Heart disease Sister   . Arthritis Son     back  . Glaucoma Son   . Hypertension Son   . Cataracts Son   . Colon cancer Sister   . Alzheimer's disease Sister   . Glaucoma Sister   . Glaucoma Sister   . Hyperlipidemia Sister   . Hypertension Sister   . Cancer Sister     thyroid  . Diabetes Sister   . Glaucoma Sister     History   Social History  . Marital Status: Married    Spouse Name: N/A  . Number of Children: N/A  . Years of Education: N/A   Occupational History  . Not on file.   Social History Main Topics  . Smoking status: Never Smoker   . Smokeless tobacco: Never Used  . Alcohol Use: No  . Drug Use: Not on file  . Sexual Activity: Not on  file   Other Topics Concern  . Not on file   Social History Narrative    Current Outpatient Prescriptions on File Prior to Visit  Medication Sig Dispense Refill  . hydrochlorothiazide (HYDRODIURIL) 25 MG tablet TAKE ONE TABLET BY MOUTH ONCE DAILY 90 tablet 1  . Multiple Vitamins-Minerals (ONE-A-DAY WOMENS 50 PLUS PO) Take 1 each by mouth daily.    . Probiotic Product (PROBIOTIC DAILY PO) Take by mouth.    . Propylene Glycol (SYSTANE BALANCE OP) Apply 1 drop to eye daily as needed.    . timolol (BETIMOL) 0.5 % ophthalmic solution Place 1 drop into the left eye 2 (two) times daily.    Marland Kitchen atenolol (TENORMIN) 25 MG tablet Take 0.5 tablets (12.5 mg total) by mouth 2 (two) times daily. (Patient taking differently: Take 12.5 mg by mouth daily. ) 90 tablet 1   No current facility-administered medications on file prior to visit.    No Known Allergies  Review of Systems  Review of Systems  Constitutional: Negative for fever and malaise/fatigue.  HENT: Negative for congestion.   Eyes: Negative for discharge.  Respiratory: Negative for shortness of breath.   Cardiovascular:  Negative for chest pain, palpitations and leg swelling.  Gastrointestinal: Negative for nausea, abdominal pain and diarrhea.  Genitourinary: Negative for dysuria.  Musculoskeletal: Negative for falls.  Skin: Negative for rash.  Neurological: Negative for loss of consciousness and headaches.  Endo/Heme/Allergies: Negative for polydipsia.  Psychiatric/Behavioral: Negative for depression and suicidal ideas. The patient is not nervous/anxious and does not have insomnia.     Objective  BP 150/62 mmHg  Pulse 57  Temp(Src) 97.9 F (36.6 C) (Oral)  Ht 4' 11.5" (1.511 m)  Wt 117 lb 4 oz (53.184 kg)  BMI 23.29 kg/m2  SpO2 100%  Physical Exam  Physical Exam  Constitutional: She is oriented to person, place, and time and well-developed, well-nourished, and in no distress. No distress.  HENT:  Head: Normocephalic and  atraumatic.  Eyes: Conjunctivae are normal.  Neck: Neck supple. No thyromegaly present.  Cardiovascular: Normal rate, regular rhythm and normal heart sounds.   No murmur heard. Pulmonary/Chest: Effort normal and breath sounds normal. She has no wheezes.  Abdominal: She exhibits no distension and no mass.  Musculoskeletal: She exhibits no edema.  Lymphadenopathy:    She has no cervical adenopathy.  Neurological: She is alert and oriented to person, place, and time.  Skin: Skin is warm and dry. No rash noted. She is not diaphoretic.  Psychiatric: Memory, affect and judgment normal.    Lab Results  Component Value Date   TSH 2.88 03/14/2015   Lab Results  Component Value Date   WBC 5.4 03/14/2015   HGB 12.4 03/14/2015   HCT 36.6 03/14/2015   MCV 94.0 03/14/2015   PLT 215.0 03/14/2015   Lab Results  Component Value Date   CREATININE 0.80 03/14/2015   BUN 14 03/14/2015   NA 134* 03/14/2015   K 4.1 03/14/2015   CL 98 03/14/2015   CO2 32 03/14/2015   Lab Results  Component Value Date   ALT 19 03/14/2015   AST 20 03/14/2015   ALKPHOS 56 03/14/2015   BILITOT 0.6 03/14/2015   Lab Results  Component Value Date   CHOL 147 03/14/2015   Lab Results  Component Value Date   HDL 55.60 03/14/2015   Lab Results  Component Value Date   LDLCALC 80 03/14/2015   Lab Results  Component Value Date   TRIG 55.0 03/14/2015   Lab Results  Component Value Date   CHOLHDL 3 03/14/2015     Assessment & Plan  HTN (hypertension) Atenolol bid and HCTZ daily. Poorly controlled will alter medications, encouraged DASH diet, minimize caffeine and obtain adequate sleep. Report concerning symptoms and follow up as directed and as needed  Osteopenia Vitamin D and calcium supplements, increase exercise  Hyperlipidemia Encouraged heart healthy diet, increase exercise, avoid trans fats, consider a krill oil cap daily  Hyperglycemia hgba1c acceptable, minimize simple carbs. Increase  exercise as tolerated. Continue current meds  Constipation Encouraged increased hydration and fiber in diet. Daily probiotics. If bowels not moving can use MOM 2 tbls po in 4 oz of warm prune juice by mouth every 2-3 days. If no results then repeat in 4 hours with  Dulcolax suppository pr, may repeat again in 4 more hours as needed. Seek care if symptoms worsen. Consider daily Miralax and/or Dulcolax if symptoms persist.

## 2015-03-24 NOTE — Assessment & Plan Note (Signed)
hgba1c acceptable, minimize simple carbs. Increase exercise as tolerated. Continue current meds 

## 2015-03-24 NOTE — Assessment & Plan Note (Signed)
Encouraged heart healthy diet, increase exercise, avoid trans fats, consider a krill oil cap daily 

## 2015-03-24 NOTE — Assessment & Plan Note (Signed)
Vitamin D and calcium supplements, increase exercise

## 2015-03-24 NOTE — Assessment & Plan Note (Signed)
Encouraged increased hydration and fiber in diet. Daily probiotics. If bowels not moving can use MOM 2 tbls po in 4 oz of warm prune juice by mouth every 2-3 days. If no results then repeat in 4 hours with  Dulcolax suppository pr, may repeat again in 4 more hours as needed. Seek care if symptoms worsen. Consider daily Miralax and/or Dulcolax if symptoms persist.  

## 2015-03-24 NOTE — Assessment & Plan Note (Signed)
Atenolol bid and HCTZ daily. Poorly controlled will alter medications, encouraged DASH diet, minimize caffeine and obtain adequate sleep. Report concerning symptoms and follow up as directed and as needed

## 2015-05-26 ENCOUNTER — Other Ambulatory Visit: Payer: Self-pay | Admitting: Family Medicine

## 2015-06-18 ENCOUNTER — Other Ambulatory Visit: Payer: Self-pay | Admitting: Family Medicine

## 2015-06-18 DIAGNOSIS — I10 Essential (primary) hypertension: Secondary | ICD-10-CM

## 2015-06-18 NOTE — Telephone Encounter (Signed)
Caller name: Relationship to patient: Can be reached: Pharmacy: [facility]  on [address]  Reason for call: Pt needing refill on atenolol.

## 2015-06-19 MED ORDER — ATENOLOL 25 MG PO TABS: 12.5000 mg | ORAL_TABLET | Freq: Two times a day (BID) | ORAL | Status: DC

## 2015-06-19 NOTE — Telephone Encounter (Signed)
LOV 03/21/15 Appointment 07/08/15  Atenolol 25 mg take 1/2 tablet BID

## 2015-07-01 ENCOUNTER — Telehealth: Payer: Self-pay | Admitting: *Deleted

## 2015-07-01 NOTE — Telephone Encounter (Signed)
Error- patient is scheduled for labs not CPE

## 2015-07-02 ENCOUNTER — Other Ambulatory Visit (INDEPENDENT_AMBULATORY_CARE_PROVIDER_SITE_OTHER): Payer: Medicare Other

## 2015-07-02 DIAGNOSIS — I10 Essential (primary) hypertension: Secondary | ICD-10-CM

## 2015-07-02 DIAGNOSIS — M8580 Other specified disorders of bone density and structure, unspecified site: Secondary | ICD-10-CM

## 2015-07-02 DIAGNOSIS — R739 Hyperglycemia, unspecified: Secondary | ICD-10-CM

## 2015-07-02 DIAGNOSIS — E785 Hyperlipidemia, unspecified: Secondary | ICD-10-CM

## 2015-07-02 LAB — COMPREHENSIVE METABOLIC PANEL
ALBUMIN: 3.8 g/dL (ref 3.5–5.2)
ALK PHOS: 53 U/L (ref 39–117)
ALT: 17 U/L (ref 0–35)
AST: 19 U/L (ref 0–37)
BILIRUBIN TOTAL: 0.6 mg/dL (ref 0.2–1.2)
BUN: 25 mg/dL — AB (ref 6–23)
CALCIUM: 10 mg/dL (ref 8.4–10.5)
CHLORIDE: 97 meq/L (ref 96–112)
CO2: 30 mEq/L (ref 19–32)
CREATININE: 0.9 mg/dL (ref 0.40–1.20)
GFR: 63.1 mL/min (ref >60.00)
Glucose, Bld: 127 mg/dL — ABNORMAL HIGH (ref 70–99)
Potassium: 4.1 mEq/L (ref 3.5–5.1)
SODIUM: 136 meq/L (ref 135–145)
TOTAL PROTEIN: 6.4 g/dL (ref 6.0–8.3)

## 2015-07-02 LAB — LIPID PANEL
CHOL/HDL RATIO: 3
CHOLESTEROL: 142 mg/dL (ref 0–200)
HDL: 55.5 mg/dL (ref >39.00)
LDL Cholesterol: 71 mg/dL (ref 0–99)
NonHDL: 86.43
TRIGLYCERIDES: 76 mg/dL (ref 0.0–149.0)
VLDL: 15.2 mg/dL (ref 0.0–40.0)

## 2015-07-02 LAB — CBC
HCT: 37.1 % (ref 36.0–46.0)
HEMOGLOBIN: 12.3 g/dL (ref 12.0–15.0)
MCHC: 33.1 g/dL (ref 30.0–36.0)
MCV: 94.9 fl (ref 78.0–100.0)
Platelets: 219 10*3/uL (ref 150.0–400.0)
RBC: 3.9 Mil/uL (ref 3.87–5.11)
RDW: 12.9 % (ref 11.5–15.5)
WBC: 5.2 10*3/uL (ref 4.0–10.5)

## 2015-07-02 LAB — HEMOGLOBIN A1C: HEMOGLOBIN A1C: 6.2 % (ref 4.6–6.5)

## 2015-07-02 LAB — VITAMIN D 25 HYDROXY (VIT D DEFICIENCY, FRACTURES): VITD: 49.12 ng/mL (ref 30.00–100.00)

## 2015-07-02 LAB — TSH: TSH: 2.37 u[IU]/mL (ref 0.35–4.50)

## 2015-07-05 ENCOUNTER — Encounter: Payer: Self-pay | Admitting: Behavioral Health

## 2015-07-05 ENCOUNTER — Telehealth: Payer: Self-pay | Admitting: Behavioral Health

## 2015-07-05 NOTE — Addendum Note (Signed)
Addended by: Eduard Roux E on: 07/05/2015 03:21 PM   Modules accepted: Orders, Medications

## 2015-07-05 NOTE — Telephone Encounter (Signed)
Patient returning your call.

## 2015-07-05 NOTE — Telephone Encounter (Signed)
Unable to reach patient at time of Pre-Visit Call.  Left message for patient to return call when available.    

## 2015-07-05 NOTE — Telephone Encounter (Signed)
Pre-Visit Call completed with patient and chart updated.   Pre-Visit Info documented in Specialty Comments under [employer].    

## 2015-07-08 ENCOUNTER — Ambulatory Visit (INDEPENDENT_AMBULATORY_CARE_PROVIDER_SITE_OTHER): Payer: Medicare Other | Admitting: Family Medicine

## 2015-07-08 ENCOUNTER — Encounter: Payer: Self-pay | Admitting: Family Medicine

## 2015-07-08 VITALS — BP 138/64 | HR 54 | Temp 98.2°F | Ht 60.0 in | Wt 123.0 lb

## 2015-07-08 DIAGNOSIS — Z0000 Encounter for general adult medical examination without abnormal findings: Secondary | ICD-10-CM | POA: Diagnosis not present

## 2015-07-08 DIAGNOSIS — R351 Nocturia: Secondary | ICD-10-CM

## 2015-07-08 DIAGNOSIS — H409 Unspecified glaucoma: Secondary | ICD-10-CM

## 2015-07-08 DIAGNOSIS — Z23 Encounter for immunization: Secondary | ICD-10-CM

## 2015-07-08 DIAGNOSIS — M8580 Other specified disorders of bone density and structure, unspecified site: Secondary | ICD-10-CM | POA: Diagnosis not present

## 2015-07-08 DIAGNOSIS — I10 Essential (primary) hypertension: Secondary | ICD-10-CM

## 2015-07-08 DIAGNOSIS — R739 Hyperglycemia, unspecified: Secondary | ICD-10-CM

## 2015-07-08 DIAGNOSIS — E785 Hyperlipidemia, unspecified: Secondary | ICD-10-CM

## 2015-07-08 DIAGNOSIS — D1720 Benign lipomatous neoplasm of skin and subcutaneous tissue of unspecified limb: Secondary | ICD-10-CM

## 2015-07-08 NOTE — Assessment & Plan Note (Signed)
Well controlled, no changes to meds. Encouraged heart healthy diet such as the DASH diet and exercise as tolerated.  °

## 2015-07-08 NOTE — Assessment & Plan Note (Signed)
hgba1c acceptable, minimize simple carbs. Increase exercise as tolerated.  

## 2015-07-08 NOTE — Progress Notes (Signed)
Subjective:    Patient ID: Amber Morgan, female    DOB: [birth year], 81 y.o.   MRN: [id number]  Chief Complaint  Patient presents with  . Medicare Wellness    HPI Patient is in today for Annual Wellness Exam and follow up on numerous concerns. Feeling well, no recent illness or trips to ER. Is doing well in home, no falls or trouble with self care or cooking. No acute concerns. Is helping care for her ailing husband. Denies CP/palp/SOB/HA/congestion/fevers/GI or GU c/o. Taking meds as prescribed  Past Medical History  Diagnosis Date  . History of chicken pox     childhood  . Diverticulitis   . HTN (hypertension)   . Colon polyps   . Urinary incontinence   . Unspecified constipation 02/06/2013  . Nocturia 02/08/2013  . Viral infection characterized by skin and mucous membrane lesions 05/11/2013  . Personal history of colonic polyps 09/05/2013  . Hyperglycemia 09/05/2013  . Diverticulosis 01/04/2014  . Lipoma of arm 12/09/2014    right  . Osteopenia 12/09/2014  . Medicare annual wellness visit, subsequent 07/08/2015    Past Surgical History  Procedure Laterality Date  . Eye surgery      multiple bilateral  . Bladder surgery  2003    bladder tact  . Abdominal hysterectomy    . Rectocele repair Bilateral     Family History  Problem Relation Age of Onset  . Colon cancer Mother   . Hypertension Mother   . Heart disease Father   . Meniere's disease Father   . Heart disease Sister   . Arthritis Son     back  . Glaucoma Son   . Hypertension Son   . Cataracts Son   . Colon cancer Sister   . Alzheimer's disease Sister   . Glaucoma Sister   . Glaucoma Sister   . Hyperlipidemia Sister   . Hypertension Sister   . Cancer Sister     thyroid  . Diabetes Sister   . Glaucoma Sister     Social History   Social History  . Marital Status: Married    Spouse Name: N/A  . Number of Children: N/A  . Years of Education: N/A   Occupational History  . Not on file.   Social History  Main Topics  . Smoking status: Never Smoker   . Smokeless tobacco: Never Used  . Alcohol Use: No  . Drug Use: Not on file  . Sexual Activity: Not on file   Other Topics Concern  . Not on file   Social History Narrative    Outpatient Prescriptions Prior to Visit  Medication Sig Dispense Refill  . atenolol (TENORMIN) 25 MG tablet Take 0.5 tablets (12.5 mg total) by mouth 2 (two) times daily. (Patient taking differently: Take 12.5 mg by mouth daily. ) 45 tablet 1  . Calcium Citrate-Vitamin D (CALCIUM CITRATE + D PO) Take 1 tablet by mouth daily.    . hydrochlorothiazide (HYDRODIURIL) 25 MG tablet TAKE ONE TABLET BY MOUTH ONCE DAILY 90 tablet 0  . Multiple Vitamins-Minerals (ONE-A-DAY WOMENS 50 PLUS PO) Take 1 each by mouth daily.    . Probiotic Product (PROBIOTIC DAILY PO) Take by mouth.    . timolol (BETIMOL) 0.5 % ophthalmic solution Place 1 drop into the left eye 2 (two) times daily.     No facility-administered medications prior to visit.   Immunization Status: Flu vaccine-- 06/01/14 Tdap-- 05/21/13 PNA-- 06/01/14 Shingles-- 07/14/11   Changes to FH, PSH  or Personal Hx: UTD Pap-- past history of hysterectomy MMG-- 02/08/15 w/ [facility]; bi-rads category 1-negative Bone Density-- 12/21/14 w/ [facility]; AP Spine L1-L4 (T-score -0.6), Femur Neck Right (T-score -1.4) & Femur Neck Left (T-score -1.2); osteopenia CCS-- 12/19/13 w/ Dr. Pilar Grammes III at [facility]; polyp (4 mm) in the splenic flexure; removed; moderate diverticulosis in the left colon; no follow-up due to age.  Care Teams Updated:  Dr. Mardene Speak - Optometry  Dr. Rolan Lipa - Gastroenterology  ED/Hospital/Urgent Care Visits: Per the patient, no recent visits to the ED/Hospital or Urgent Care. No Known Allergies  Review of Systems  Constitutional: Negative for fever, chills and malaise/fatigue.  HENT: Negative for congestion and  hearing loss.   Eyes: Negative for discharge.  Respiratory: Negative for cough, sputum production and shortness of breath.   Cardiovascular: Negative for chest pain, palpitations and leg swelling.  Gastrointestinal: Negative for heartburn, nausea, vomiting, abdominal pain, diarrhea, constipation and blood in stool.  Genitourinary: Negative for dysuria, urgency, frequency and hematuria.  Musculoskeletal: Negative for myalgias, back pain and falls.  Skin: Negative for rash.  Neurological: Negative for dizziness, sensory change, loss of consciousness, weakness and headaches.  Endo/Heme/Allergies: Negative for environmental allergies. Does not bruise/bleed easily.  Psychiatric/Behavioral: Negative for depression and suicidal ideas. The patient is not nervous/anxious and does not have insomnia.        Objective:    Physical Exam  Constitutional: She is oriented to person, place, and time. She appears well-developed and well-nourished. No distress.  HENT:  Head: Normocephalic and atraumatic.  Eyes: Conjunctivae are normal.  Neck: Neck supple. No thyromegaly present.  Cardiovascular: Normal rate, regular rhythm and normal heart sounds.   No murmur heard. Pulmonary/Chest: Effort normal and breath sounds normal. No respiratory distress.  Abdominal: Soft. Bowel sounds are normal. She exhibits no distension and no mass. There is no tenderness.  Musculoskeletal: She exhibits no edema or tenderness.  Lipoma upper arm, 3 cm in diameter, mobile, nontender, soft  Lymphadenopathy:    She has no cervical adenopathy.  Neurological: She is alert and oriented to person, place, and time.  Skin: Skin is warm and dry.  Psychiatric: She has a normal mood and affect. Her behavior is normal.    BP 138/64 mmHg  Pulse 54  Temp(Src) 98.2 F (36.8 C) (Oral)  Ht 5' (1.524 m)  Wt 123 lb (55.792 kg)  BMI 24.02 kg/m2  SpO2 97% Wt Readings from Last 3 Encounters:  07/08/15 123 lb (55.792 kg)  03/21/15 117  lb 4 oz (53.184 kg)  12/07/14 122 lb 4 oz (55.452 kg)     Lab Results  Component Value Date   WBC 5.2 07/02/2015   HGB 12.3 07/02/2015   HCT 37.1 07/02/2015   PLT 219.0 07/02/2015   GLUCOSE 127* 07/02/2015   CHOL 142 07/02/2015   TRIG 76.0 07/02/2015   HDL 55.50 07/02/2015   LDLCALC 71 07/02/2015   ALT 17 07/02/2015   Morgan 19 07/02/2015   NA 136 07/02/2015   K 4.1 07/02/2015   CL 97 07/02/2015   CREATININE 0.90 07/02/2015   BUN 25* 07/02/2015   CO2 30 07/02/2015   TSH 2.37 07/02/2015   HGBA1C 6.2 07/02/2015    Lab Results  Component Value Date   TSH 2.37 07/02/2015   Lab Results  Component Value Date   WBC 5.2 07/02/2015   HGB 12.3 07/02/2015   HCT 37.1 07/02/2015   MCV  94.9 07/02/2015   PLT 219.0 07/02/2015   Lab Results  Component Value Date   NA 136 07/02/2015   K 4.1 07/02/2015   CO2 30 07/02/2015   GLUCOSE 127* 07/02/2015   BUN 25* 07/02/2015   CREATININE 0.90 07/02/2015   BILITOT 0.6 07/02/2015   ALKPHOS 53 07/02/2015   Morgan 19 07/02/2015   ALT 17 07/02/2015   PROT 6.4 07/02/2015   ALBUMIN 3.8 07/02/2015   CALCIUM 10.0 07/02/2015   GFR 63.10 07/02/2015   Lab Results  Component Value Date   CHOL 142 07/02/2015   Lab Results  Component Value Date   HDL 55.50 07/02/2015   Lab Results  Component Value Date   LDLCALC 71 07/02/2015   Lab Results  Component Value Date   TRIG 76.0 07/02/2015   Lab Results  Component Value Date   CHOLHDL 3 07/02/2015   Lab Results  Component Value Date   HGBA1C 6.2 07/02/2015       Assessment & Plan:   HTN (hypertension) Well controlled, no changes to meds. Encouraged heart healthy diet such as the DASH diet and exercise as tolerated.   Hyperglycemia hgba1c acceptable, minimize simple carbs. Increase exercise as tolerated.   Hyperlipidemia Encouraged heart healthy diet, increase exercise, avoid trans fats, consider a krill oil cap daily  Medicare annual wellness visit, subsequent Patient denies  any difficulties at home. No trouble with ADLs, depression or falls. No recent changes to vision or hearing. Is UTD with immunizations. Is UTD with screening. Discussed Advanced Directives, patient agrees to bring [place] copies of documents if can. Encouraged heart healthy diet, exercise as tolerated and adequate sleep. Labs reviewed  Immunization Status: Flu vaccine-- 06/01/14 Tdap-- 05/21/13 PNA-- 06/01/14 Shingles-- 07/14/11  A/P:  Changes to [place], PSH or Personal Hx: UTD Pap-- past history of hysterectomy MMG-- 02/08/15 w/ [facility]; bi-rads category 1-negative Bone Density-- 12/21/14 w/ [facility]; AP Spine L1-L4 (T-score -0.6), Femur Neck Right (T-score -1.4) & Femur Neck Left (T-score -1.2); osteopenia CCS-- 12/19/13 w/ Dr. Pilar Grammes III at [facility]; polyp (4 mm) in the splenic flexure; removed; moderate diverticulosis in the left colon; no follow-up due to age.  Care Teams Updated:  Dr. Mardene Speak - Optometry  Dr. Rolan Lipa - Gastroenterology, last colonoscopy 2015, no more for screening. [facility] May 2016  ED/Hospital/Urgent Care Visits: Per the patient, no recent visits to the ED/Hospital or Urgent Care.  See problem list for risk factors See AVS for recommended preventative health screens.    Lipoma of arm No changes on exam  Nocturia Persistent but no new symptoms, minimize fluids at bedtime  Glaucoma No recent changes in meds, sees Dr Jerline Pain  Osteopenia Check vitamin D with next blood draw, maintain 2000 IU daily, calcium 1200 to 1500    I am having Ms. Fromme maintain her timolol, Multiple Vitamins-Minerals (ONE-A-DAY WOMENS 50 PLUS PO), Probiotic Product (PROBIOTIC DAILY PO), hydrochlorothiazide, atenolol, and Calcium Citrate-Vitamin D (CALCIUM CITRATE + D PO).  No orders of the defined types were placed in this encounter.     Penni Homans, MD

## 2015-07-08 NOTE — Assessment & Plan Note (Signed)
No recent changes in meds, sees Dr Jerline Pain

## 2015-07-08 NOTE — Patient Instructions (Signed)
Preventive Care for Adults A healthy lifestyle and preventive care can promote health and wellness. Preventive health guidelines for women include the following key practices.  A routine yearly physical is a good way to check with your health care provider about your health and preventive screening. It is a chance to share any concerns and updates on your health and to receive a thorough exam.  Visit your dentist for a routine exam and preventive care every 6 months. Brush your teeth twice a day and floss once a day. Good oral hygiene prevents tooth decay and gum disease.  The frequency of eye exams is based on your age, health, family medical history, use of contact lenses, and other factors. Follow your health care provider's recommendations for frequency of eye exams.  Eat a healthy diet. Foods like vegetables, fruits, whole grains, low-fat dairy products, and lean protein foods contain the nutrients you need without too many calories. Decrease your intake of foods high in solid fats, added sugars, and salt. Eat the right amount of calories for you.Get information about a proper diet from your health care provider, if necessary.  Regular physical exercise is one of the most important things you can do for your health. Most adults should get at least 150 minutes of moderate-intensity exercise (any activity that increases your heart rate and causes you to sweat) each week. In addition, most adults need muscle-strengthening exercises on 2 or more days a week.  Maintain a healthy weight. The body mass index (BMI) is a screening tool to identify possible weight problems. It provides an estimate of body fat based on height and weight. Your health care provider can find your BMI and can help you achieve or maintain a healthy weight.For adults 20 years and older:  A BMI below 18.5 is considered underweight.  A BMI of 18.5 to 24.9 is normal.  A BMI of 25 to 29.9 is considered overweight.  A BMI of  30 and above is considered obese.  Maintain normal blood lipids and cholesterol levels by exercising and minimizing your intake of saturated fat. Eat a balanced diet with plenty of fruit and vegetables. Blood tests for lipids and cholesterol should begin at age 76 and be repeated every 5 years. If your lipid or cholesterol levels are high, you are over 50, or you are at high risk for heart disease, you may need your cholesterol levels checked more frequently.Ongoing high lipid and cholesterol levels should be treated with medicines if diet and exercise are not working.  If you smoke, find out from your health care provider how to quit. If you do not use tobacco, do not start.  Lung cancer screening is recommended for adults aged 22-80 years who are at high risk for developing lung cancer because of a history of smoking. A yearly low-dose CT scan of the lungs is recommended for people who have at least a 30-pack-year history of smoking and are a current smoker or have quit within the past 15 years. A pack year of smoking is smoking an average of 1 pack of cigarettes a day for 1 year (for example: 1 pack a day for 30 years or 2 packs a day for 15 years). Yearly screening should continue until the smoker has stopped smoking for at least 15 years. Yearly screening should be stopped for people who develop a health problem that would prevent them from having lung cancer treatment.  If you are pregnant, do not drink alcohol. If you are breastfeeding,  be very cautious about drinking alcohol. If you are not pregnant and choose to drink alcohol, do not have more than 1 drink per day. One drink is considered to be 12 ounces (355 mL) of beer, 5 ounces (148 mL) of wine, or 1.5 ounces (44 mL) of liquor.  Avoid use of street drugs. Do not share needles with anyone. Ask for help if you need support or instructions about stopping the use of drugs.  High blood pressure causes heart disease and increases the risk of  stroke. Your blood pressure should be checked at least every 1 to 2 years. Ongoing high blood pressure should be treated with medicines if weight loss and exercise do not work.  If you are 75-52 years old, ask your health care provider if you should take aspirin to prevent strokes.  Diabetes screening involves taking a blood sample to check your fasting blood sugar level. This should be done once every 3 years, after age 15, if you are within normal weight and without risk factors for diabetes. Testing should be considered at a younger age or be carried out more frequently if you are overweight and have at least 1 risk factor for diabetes.  Breast cancer screening is essential preventive care for women. You should practice "breast self-awareness." This means understanding the normal appearance and feel of your breasts and may include breast self-examination. Any changes detected, no matter how small, should be reported to a health care provider. Women in their 58s and 30s should have a clinical breast exam (CBE) by a health care provider as part of a regular health exam every 1 to 3 years. After age 16, women should have a CBE every year. Starting at age 53, women should consider having a mammogram (breast X-ray test) every year. Women who have a family history of breast cancer should talk to their health care provider about genetic screening. Women at a high risk of breast cancer should talk to their health care providers about having an MRI and a mammogram every year.  Breast cancer gene (BRCA)-related cancer risk assessment is recommended for women who have family members with BRCA-related cancers. BRCA-related cancers include breast, ovarian, tubal, and peritoneal cancers. Having family members with these cancers may be associated with an increased risk for harmful changes (mutations) in the breast cancer genes BRCA1 and BRCA2. Results of the assessment will determine the need for genetic counseling and  BRCA1 and BRCA2 testing.  Routine pelvic exams to screen for cancer are no longer recommended for nonpregnant women who are considered low risk for cancer of the pelvic organs (ovaries, uterus, and vagina) and who do not have symptoms. Ask your health care provider if a screening pelvic exam is right for you.  If you have had past treatment for cervical cancer or a condition that could lead to cancer, you need Pap tests and screening for cancer for at least 20 years after your treatment. If Pap tests have been discontinued, your risk factors (such as having a new sexual partner) need to be reassessed to determine if screening should be resumed. Some women have medical problems that increase the chance of getting cervical cancer. In these cases, your health care provider may recommend more frequent screening and Pap tests.  The HPV test is an additional test that may be used for cervical cancer screening. The HPV test looks for the virus that can cause the cell changes on the cervix. The cells collected during the Pap test can be  tested for HPV. The HPV test could be used to screen women aged 30 years and older, and should be used in women of any age who have unclear Pap test results. After the age of 30, women should have HPV testing at the same frequency as a Pap test.  Colorectal cancer can be detected and often prevented. Most routine colorectal cancer screening begins at the age of 50 years and continues through age 75 years. However, your health care provider may recommend screening at an earlier age if you have risk factors for colon cancer. On a yearly basis, your health care provider may provide home test kits to check for hidden blood in the stool. Use of a small camera at the end of a tube, to directly examine the colon (sigmoidoscopy or colonoscopy), can detect the earliest forms of colorectal cancer. Talk to your health care provider about this at age 50, when routine screening begins. Direct  exam of the colon should be repeated every 5-10 years through age 75 years, unless early forms of pre-cancerous polyps or small growths are found.  People who are at an increased risk for hepatitis B should be screened for this virus. You are considered at high risk for hepatitis B if:  You were born in a country where hepatitis B occurs often. Talk with your health care provider about which countries are considered high risk.  Your parents were born in a high-risk country and you have not received a shot to protect against hepatitis B (hepatitis B vaccine).  You have HIV or AIDS.  You use needles to inject street drugs.  You live with, or have sex with, someone who has hepatitis B.  You get hemodialysis treatment.  You take certain medicines for conditions like cancer, organ transplantation, and autoimmune conditions.  Hepatitis C blood testing is recommended for all people born from 1945 through 1965 and any individual with known risks for hepatitis C.  Practice safe sex. Use condoms and avoid high-risk sexual practices to reduce the spread of sexually transmitted infections (STIs). STIs include gonorrhea, chlamydia, syphilis, trichomonas, herpes, HPV, and human immunodeficiency virus (HIV). Herpes, HIV, and HPV are viral illnesses that have no cure. They can result in disability, cancer, and death.  You should be screened for sexually transmitted illnesses (STIs) including gonorrhea and chlamydia if:  You are sexually active and are younger than 24 years.  You are older than 24 years and your health care provider tells you that you are at risk for this type of infection.  Your sexual activity has changed since you were last screened and you are at an increased risk for chlamydia or gonorrhea. Ask your health care provider if you are at risk.  If you are at risk of being infected with HIV, it is recommended that you take a prescription medicine daily to prevent HIV infection. This is  called preexposure prophylaxis (PrEP). You are considered at risk if:  You are a heterosexual woman, are sexually active, and are at increased risk for HIV infection.  You take drugs by injection.  You are sexually active with a partner who has HIV.  Talk with your health care provider about whether you are at high risk of being infected with HIV. If you choose to begin PrEP, you should first be tested for HIV. You should then be tested every 3 months for as long as you are taking PrEP.  Osteoporosis is a disease in which the bones lose minerals and strength   with aging. This can result in serious bone fractures or breaks. The risk of osteoporosis can be identified using a bone density scan. Women ages 65 years and over and women at risk for fractures or osteoporosis should discuss screening with their health care providers. Ask your health care provider whether you should take a calcium supplement or vitamin D to reduce the rate of osteoporosis.  Menopause can be associated with physical symptoms and risks. Hormone replacement therapy is available to decrease symptoms and risks. You should talk to your health care provider about whether hormone replacement therapy is right for you.  Use sunscreen. Apply sunscreen liberally and repeatedly throughout the day. You should seek shade when your shadow is shorter than you. Protect yourself by wearing long sleeves, pants, a wide-brimmed hat, and sunglasses year round, whenever you are outdoors.  Once a month, do a whole body skin exam, using a mirror to look at the skin on your back. Tell your health care provider of new moles, moles that have irregular borders, moles that are larger than a pencil eraser, or moles that have changed in shape or color.  Stay current with required vaccines (immunizations).  Influenza vaccine. All adults should be immunized every year.  Tetanus, diphtheria, and acellular pertussis (Td, Tdap) vaccine. Pregnant women should  receive 1 dose of Tdap vaccine during each pregnancy. The dose should be obtained regardless of the length of time since the last dose. Immunization is preferred during the 27th-36th week of gestation. An adult who has not previously received Tdap or who does not know her vaccine status should receive 1 dose of Tdap. This initial dose should be followed by tetanus and diphtheria toxoids (Td) booster doses every 10 years. Adults with an unknown or incomplete history of completing a 3-dose immunization series with Td-containing vaccines should begin or complete a primary immunization series including a Tdap dose. Adults should receive a Td booster every 10 years.  Varicella vaccine. An adult without evidence of immunity to varicella should receive 2 doses or a second dose if she has previously received 1 dose. Pregnant females who do not have evidence of immunity should receive the first dose after pregnancy. This first dose should be obtained before leaving the health care facility. The second dose should be obtained 4-8 weeks after the first dose.  Human papillomavirus (HPV) vaccine. Females aged 13-26 years who have not received the vaccine previously should obtain the 3-dose series. The vaccine is not recommended for use in pregnant females. However, pregnancy testing is not needed before receiving a dose. If a female is found to be pregnant after receiving a dose, no treatment is needed. In that case, the remaining doses should be delayed until after the pregnancy. Immunization is recommended for any person with an immunocompromised condition through the age of 26 years if she did not get any or all doses earlier. During the 3-dose series, the second dose should be obtained 4-8 weeks after the first dose. The third dose should be obtained 24 weeks after the first dose and 16 weeks after the second dose.  Zoster vaccine. One dose is recommended for adults aged 60 years or older unless certain conditions are  present.  Measles, mumps, and rubella (MMR) vaccine. Adults born before 1957 generally are considered immune to measles and mumps. Adults born in 1957 or later should have 1 or more doses of MMR vaccine unless there is a contraindication to the vaccine or there is laboratory evidence of immunity to   each of the three diseases. A routine second dose of MMR vaccine should be obtained at least 28 days after the first dose for students attending postsecondary schools, health care workers, or international travelers. People who received inactivated measles vaccine or an unknown type of measles vaccine during 1963-1967 should receive 2 doses of MMR vaccine. People who received inactivated mumps vaccine or an unknown type of mumps vaccine before 1979 and are at high risk for mumps infection should consider immunization with 2 doses of MMR vaccine. For females of childbearing age, rubella immunity should be determined. If there is no evidence of immunity, females who are not pregnant should be vaccinated. If there is no evidence of immunity, females who are pregnant should delay immunization until after pregnancy. Unvaccinated health care workers born before 1957 who lack laboratory evidence of measles, mumps, or rubella immunity or laboratory confirmation of disease should consider measles and mumps immunization with 2 doses of MMR vaccine or rubella immunization with 1 dose of MMR vaccine.  Pneumococcal 13-valent conjugate (PCV13) vaccine. When indicated, a person who is uncertain of her immunization history and has no record of immunization should receive the PCV13 vaccine. An adult aged 19 years or older who has certain medical conditions and has not been previously immunized should receive 1 dose of PCV13 vaccine. This PCV13 should be followed with a dose of pneumococcal polysaccharide (PPSV23) vaccine. The PPSV23 vaccine dose should be obtained at least 8 weeks after the dose of PCV13 vaccine. An adult aged 19  years or older who has certain medical conditions and previously received 1 or more doses of PPSV23 vaccine should receive 1 dose of PCV13. The PCV13 vaccine dose should be obtained 1 or more years after the last PPSV23 vaccine dose.  Pneumococcal polysaccharide (PPSV23) vaccine. When PCV13 is also indicated, PCV13 should be obtained first. All adults aged 65 years and older should be immunized. An adult younger than age 65 years who has certain medical conditions should be immunized. Any person who resides in a nursing home or long-term care facility should be immunized. An adult smoker should be immunized. People with an immunocompromised condition and certain other conditions should receive both PCV13 and PPSV23 vaccines. People with human immunodeficiency virus (HIV) infection should be immunized as soon as possible after diagnosis. Immunization during chemotherapy or radiation therapy should be avoided. Routine use of PPSV23 vaccine is not recommended for American Indians, [place] Natives, or people younger than 65 years unless there are medical conditions that require PPSV23 vaccine. When indicated, people who have unknown immunization and have no record of immunization should receive PPSV23 vaccine. One-time revaccination 5 years after the first dose of PPSV23 is recommended for people aged 19-64 years who have chronic kidney failure, nephrotic syndrome, asplenia, or immunocompromised conditions. People who received 1-2 doses of PPSV23 before age 65 years should receive another dose of PPSV23 vaccine at age 65 years or later if at least 5 years have passed since the previous dose. Doses of PPSV23 are not needed for people immunized with PPSV23 at or after age 65 years.  Meningococcal vaccine. Adults with asplenia or persistent complement component deficiencies should receive 2 doses of quadrivalent meningococcal conjugate (MenACWY-D) vaccine. The doses should be obtained at least 2 months apart.  Microbiologists working with certain meningococcal bacteria, military recruits, people at risk during an outbreak, and people who travel to or live in countries with a high rate of meningitis should be immunized. A first-year college student up through age   21 years who is living in a residence hall should receive a dose if she did not receive a dose on or after her 16th birthday. Adults who have certain high-risk conditions should receive one or more doses of vaccine.  Hepatitis A vaccine. Adults who wish to be protected from this disease, have certain high-risk conditions, work with hepatitis A-infected animals, work in hepatitis A research labs, or travel to or work in countries with a high rate of hepatitis A should be immunized. Adults who were previously unvaccinated and who anticipate close contact with an international adoptee during the first 60 days after arrival in the [place] from a country with a high rate of hepatitis A should be immunized.  Hepatitis B vaccine. Adults who wish to be protected from this disease, have certain high-risk conditions, may be exposed to blood or other infectious body fluids, are household contacts or sex partners of hepatitis B positive people, are clients or workers in certain care facilities, or travel to or work in countries with a high rate of hepatitis B should be immunized.  Haemophilus influenzae type b (Hib) vaccine. A previously unvaccinated person with asplenia or sickle cell disease or having a scheduled splenectomy should receive 1 dose of Hib vaccine. Regardless of previous immunization, a recipient of a hematopoietic stem cell transplant should receive a 3-dose series 6-12 months after her successful transplant. Hib vaccine is not recommended for adults with HIV infection. Preventive Services / Frequency Ages 64 to 68 years  Blood pressure check.** / Every 1 to 2 years.  Lipid and cholesterol check.** / Every 5 years beginning at age  22.  Clinical breast exam.** / Every 3 years for women in their 88s and 53s.  BRCA-related cancer risk assessment.** / For women who have family members with a BRCA-related cancer (breast, ovarian, tubal, or peritoneal cancers).  Pap test.** / Every 2 years from ages 90 through 51. Every 3 years starting at age 21 through age 56 or 3 with a history of 3 consecutive normal Pap tests.  HPV screening.** / Every 3 years from ages 24 through ages 1 to 46 with a history of 3 consecutive normal Pap tests.  Hepatitis C blood test.** / For any individual with known risks for hepatitis C.  Skin self-exam. / Monthly.  Influenza vaccine. / Every year.  Tetanus, diphtheria, and acellular pertussis (Tdap, Td) vaccine.** / Consult your health care provider. Pregnant women should receive 1 dose of Tdap vaccine during each pregnancy. 1 dose of Td every 10 years.  Varicella vaccine.** / Consult your health care provider. Pregnant females who do not have evidence of immunity should receive the first dose after pregnancy.  HPV vaccine. / 3 doses over 6 months, if 72 and younger. The vaccine is not recommended for use in pregnant females. However, pregnancy testing is not needed before receiving a dose.  Measles, mumps, rubella (MMR) vaccine.** / You need at least 1 dose of MMR if you were born in 1957 or later. You may also need a 2nd dose. For females of childbearing age, rubella immunity should be determined. If there is no evidence of immunity, females who are not pregnant should be vaccinated. If there is no evidence of immunity, females who are pregnant should delay immunization until after pregnancy.  Pneumococcal 13-valent conjugate (PCV13) vaccine.** / Consult your health care provider.  Pneumococcal polysaccharide (PPSV23) vaccine.** / 1 to 2 doses if you smoke cigarettes or if you have certain conditions.  Meningococcal vaccine.** /  1 dose if you are age 19 to 21 years and a first-year college  student living in a residence hall, or have one of several medical conditions, you need to get vaccinated against meningococcal disease. You may also need additional booster doses.  Hepatitis A vaccine.** / Consult your health care provider.  Hepatitis B vaccine.** / Consult your health care provider.  Haemophilus influenzae type b (Hib) vaccine.** / Consult your health care provider. Ages 40 to 64 years  Blood pressure check.** / Every 1 to 2 years.  Lipid and cholesterol check.** / Every 5 years beginning at age 20 years.  Lung cancer screening. / Every year if you are aged 55-80 years and have a 30-pack-year history of smoking and currently smoke or have quit within the past 15 years. Yearly screening is stopped once you have quit smoking for at least 15 years or develop a health problem that would prevent you from having lung cancer treatment.  Clinical breast exam.** / Every year after age 40 years.  BRCA-related cancer risk assessment.** / For women who have family members with a BRCA-related cancer (breast, ovarian, tubal, or peritoneal cancers).  Mammogram.** / Every year beginning at age 40 years and continuing for as long as you are in good health. Consult with your health care provider.  Pap test.** / Every 3 years starting at age 30 years through age 65 or 70 years with a history of 3 consecutive normal Pap tests.  HPV screening.** / Every 3 years from ages 30 years through ages 65 to 70 years with a history of 3 consecutive normal Pap tests.  Fecal occult blood test (FOBT) of stool. / Every year beginning at age 50 years and continuing until age 75 years. You may not need to do this test if you get a colonoscopy every 10 years.  Flexible sigmoidoscopy or colonoscopy.** / Every 5 years for a flexible sigmoidoscopy or every 10 years for a colonoscopy beginning at age 50 years and continuing until age 75 years.  Hepatitis C blood test.** / For all people born from 1945 through  1965 and any individual with known risks for hepatitis C.  Skin self-exam. / Monthly.  Influenza vaccine. / Every year.  Tetanus, diphtheria, and acellular pertussis (Tdap/Td) vaccine.** / Consult your health care provider. Pregnant women should receive 1 dose of Tdap vaccine during each pregnancy. 1 dose of Td every 10 years.  Varicella vaccine.** / Consult your health care provider. Pregnant females who do not have evidence of immunity should receive the first dose after pregnancy.  Zoster vaccine.** / 1 dose for adults aged 60 years or older.  Measles, mumps, rubella (MMR) vaccine.** / You need at least 1 dose of MMR if you were born in 1957 or later. You may also need a 2nd dose. For females of childbearing age, rubella immunity should be determined. If there is no evidence of immunity, females who are not pregnant should be vaccinated. If there is no evidence of immunity, females who are pregnant should delay immunization until after pregnancy.  Pneumococcal 13-valent conjugate (PCV13) vaccine.** / Consult your health care provider.  Pneumococcal polysaccharide (PPSV23) vaccine.** / 1 to 2 doses if you smoke cigarettes or if you have certain conditions.  Meningococcal vaccine.** / Consult your health care provider.  Hepatitis A vaccine.** / Consult your health care provider.  Hepatitis B vaccine.** / Consult your health care provider.  Haemophilus influenzae type b (Hib) vaccine.** / Consult your health care provider. Ages 65   years and over  Blood pressure check.** / Every 1 to 2 years.  Lipid and cholesterol check.** / Every 5 years beginning at age 22 years.  Lung cancer screening. / Every year if you are aged 73-80 years and have a 30-pack-year history of smoking and currently smoke or have quit within the past 15 years. Yearly screening is stopped once you have quit smoking for at least 15 years or develop a health problem that would prevent you from having lung cancer  treatment.  Clinical breast exam.** / Every year after age 4 years.  BRCA-related cancer risk assessment.** / For women who have family members with a BRCA-related cancer (breast, ovarian, tubal, or peritoneal cancers).  Mammogram.** / Every year beginning at age 40 years and continuing for as long as you are in good health. Consult with your health care provider.  Pap test.** / Every 3 years starting at age 9 years through age 34 or 91 years with 3 consecutive normal Pap tests. Testing can be stopped between 65 and 70 years with 3 consecutive normal Pap tests and no abnormal Pap or HPV tests in the past 10 years.  HPV screening.** / Every 3 years from ages 57 years through ages 64 or 45 years with a history of 3 consecutive normal Pap tests. Testing can be stopped between 65 and 70 years with 3 consecutive normal Pap tests and no abnormal Pap or HPV tests in the past 10 years.  Fecal occult blood test (FOBT) of stool. / Every year beginning at age 15 years and continuing until age 17 years. You may not need to do this test if you get a colonoscopy every 10 years.  Flexible sigmoidoscopy or colonoscopy.** / Every 5 years for a flexible sigmoidoscopy or every 10 years for a colonoscopy beginning at age 86 years and continuing until age 71 years.  Hepatitis C blood test.** / For all people born from [birth year] through 1963 and any individual with known risks for hepatitis C.  Osteoporosis screening.** / A one-time screening for women ages 83 years and over and women at risk for fractures or osteoporosis.  Skin self-exam. / Monthly.  Influenza vaccine. / Every year.  Tetanus, diphtheria, and acellular pertussis (Tdap/Td) vaccine.** / 1 dose of Td every 10 years.  Varicella vaccine.** / Consult your health care provider.  Zoster vaccine.** / 1 dose for adults aged 61 years or older.  Pneumococcal 13-valent conjugate (PCV13) vaccine.** / Consult your health care provider.  Pneumococcal  polysaccharide (PPSV23) vaccine.** / 1 dose for all adults aged 28 years and older.  Meningococcal vaccine.** / Consult your health care provider.  Hepatitis A vaccine.** / Consult your health care provider.  Hepatitis B vaccine.** / Consult your health care provider.  Haemophilus influenzae type b (Hib) vaccine.** / Consult your health care provider. ** Family history and personal history of risk and conditions may change your health care provider's recommendations. Document Released: 11/17/2001 Document Revised: 02/05/2014 Document Reviewed: 02/16/2011 [facility] Patient Information 2015 [place], [place]. This information is not intended to replace advice given to you by your health care provider. Make sure you discuss any questions you have with your health care provider.

## 2015-07-08 NOTE — Assessment & Plan Note (Signed)
Persistent but no new symptoms, minimize fluids at bedtime

## 2015-07-08 NOTE — Assessment & Plan Note (Signed)
No changes on exam

## 2015-07-08 NOTE — Assessment & Plan Note (Addendum)
Patient denies any difficulties at home. No trouble with ADLs, depression or falls. No recent changes to vision or hearing. Is UTD with immunizations. Is UTD with screening. Discussed Advanced Directives, patient agrees to bring [place] copies of documents if can. Encouraged heart healthy diet, exercise as tolerated and adequate sleep. Labs reviewed  Immunization Status: Flu vaccine-- 06/01/14 Tdap-- 05/21/13 PNA-- 06/01/14 Shingles-- 07/14/11  A/P:  Changes to [place], PSH or Personal Hx: UTD Pap-- past history of hysterectomy MMG-- 02/08/15 w/ [facility]; bi-rads category 1-negative Bone Density-- 12/21/14 w/ [facility]; AP Spine L1-L4 (T-score -0.6), Femur Neck Right (T-score -1.4) & Femur Neck Left (T-score -1.2); osteopenia CCS-- 12/19/13 w/ Dr. Pilar Grammes III at [facility]; polyp (4 mm) in the splenic flexure; removed; moderate diverticulosis in the left colon; no follow-up due to age.  Care Teams Updated:  Dr. Mardene Speak - Optometry  Dr. Rolan Lipa - Gastroenterology, last colonoscopy 2015, no more for screening. [facility] May 2016  ED/Hospital/Urgent Care Visits: Per the patient, no recent visits to the ED/Hospital or Urgent Care.  See problem list for risk factors See AVS for recommended preventative health screens.

## 2015-07-08 NOTE — Assessment & Plan Note (Signed)
Encouraged heart healthy diet, increase exercise, avoid trans fats, consider a krill oil cap daily 

## 2015-07-08 NOTE — Assessment & Plan Note (Signed)
Check vitamin D with next blood draw, maintain 2000 IU daily, calcium 1200 to 1500

## 2015-07-08 NOTE — Progress Notes (Signed)
Pre visit review using our clinic review tool, if applicable. No additional management support is needed unless otherwise documented below in the visit note. 

## 2015-08-19 ENCOUNTER — Other Ambulatory Visit: Payer: Self-pay | Admitting: Family Medicine

## 2015-08-28 ENCOUNTER — Encounter: Payer: Self-pay | Admitting: Family Medicine

## 2015-08-28 ENCOUNTER — Ambulatory Visit (INDEPENDENT_AMBULATORY_CARE_PROVIDER_SITE_OTHER): Payer: Medicare Other | Admitting: Family Medicine

## 2015-08-28 VITALS — BP 122/62 | HR 73 | Temp 97.9°F | Resp 16 | Wt 122.0 lb

## 2015-08-28 DIAGNOSIS — H9311 Tinnitus, right ear: Secondary | ICD-10-CM

## 2015-08-28 MED ORDER — FLUTICASONE PROPIONATE 50 MCG/ACT NA SUSP: 2.0000 | Freq: Every day | NASAL | Status: DC

## 2015-08-28 NOTE — Progress Notes (Signed)
Pre visit review using our clinic review tool, if applicable. No additional management support is needed unless otherwise documented below in the visit note. 

## 2015-08-28 NOTE — Patient Instructions (Signed)
Follow up by phone next week to tell us how the ringing in the ears is doing Drink plenty of fluids Start the Flonase- 2 sprays each nostril daily If no improvement by next week, call and we'll send you to ENT Call with any questions or concerns Happy Thanksgiving!!!

## 2015-08-28 NOTE — Progress Notes (Signed)
   Subjective:    Patient ID: Amber Morgan, female    DOB: [birth year], 81 y.o.   MRN: [id number]  HPI Tinnitus- 'it's been going on for awhile', 'a month or so'.  R ear.  Occurs intermittently.  Worse at night.  No dizziness.  No blurry or double vision.  No drainage from ear.  No injury that pt is aware.  Pt worked for years in the 'sewing factory'.  No change in hearing.  No pain or discomfort.   Review of Systems For ROS see HPI     Objective:   Physical Exam  Constitutional: She is oriented to person, place, and time. She appears well-developed and well-nourished. No distress.  HENT:  Head: Normocephalic and atraumatic.  R TM retracted L TM WNL + PND No sinus pain/pressure  Eyes: Conjunctivae and EOM are normal. Pupils are equal, round, and reactive to light.  Neurological: She is alert and oriented to person, place, and time. No cranial nerve deficit. Coordination normal.  Skin: Skin is warm and dry.  Psychiatric: She has a normal mood and affect. Her behavior is normal. Thought content normal.  Vitals reviewed.         Assessment & Plan:

## 2015-08-28 NOTE — Assessment & Plan Note (Signed)
New.  No abnormalities on PE w/ exception of retracted R TM and PND.  Start nasal steroid.  If no improvement will refer to ENT for complete evaluation and tx.  Pt expressed understanding and is in agreement w/ plan.

## 2015-09-03 ENCOUNTER — Ambulatory Visit (INDEPENDENT_AMBULATORY_CARE_PROVIDER_SITE_OTHER): Payer: Medicare Other | Admitting: Family Medicine

## 2015-09-03 ENCOUNTER — Encounter: Payer: Self-pay | Admitting: Family Medicine

## 2015-09-03 VITALS — BP 140/84 | HR 57 | Temp 98.1°F | Ht 60.0 in | Wt 123.1 lb

## 2015-09-03 DIAGNOSIS — I10 Essential (primary) hypertension: Secondary | ICD-10-CM | POA: Diagnosis not present

## 2015-09-03 DIAGNOSIS — R350 Frequency of micturition: Secondary | ICD-10-CM

## 2015-09-03 DIAGNOSIS — H6593 Unspecified nonsuppurative otitis media, bilateral: Secondary | ICD-10-CM | POA: Diagnosis not present

## 2015-09-03 DIAGNOSIS — H9319 Tinnitus, unspecified ear: Secondary | ICD-10-CM

## 2015-09-03 DIAGNOSIS — H9311 Tinnitus, right ear: Secondary | ICD-10-CM

## 2015-09-03 DIAGNOSIS — R739 Hyperglycemia, unspecified: Secondary | ICD-10-CM

## 2015-09-03 DIAGNOSIS — E785 Hyperlipidemia, unspecified: Secondary | ICD-10-CM

## 2015-09-03 DIAGNOSIS — N390 Urinary tract infection, site not specified: Secondary | ICD-10-CM

## 2015-09-03 LAB — POCT URINALYSIS DIPSTICK
BILIRUBIN UA: NEGATIVE
Blood, UA: NEGATIVE
CLARITY UA: NEGATIVE
Glucose, UA: NEGATIVE
KETONES UA: NEGATIVE
LEUKOCYTES UA: NEGATIVE
Nitrite, UA: NEGATIVE
PH UA: 6
Protein, UA: NEGATIVE
SPEC GRAV UA: 1.015
UROBILINOGEN UA: 2

## 2015-09-03 NOTE — Progress Notes (Signed)
Pre visit review using our clinic review tool, if applicable. No additional management support is needed unless otherwise documented below in the visit note. 

## 2015-09-03 NOTE — Patient Instructions (Signed)
Zyrtec/Cetirizine 10 mg tabs 1 daily and Flonase 2 sprays each nostril daily and nasal saline flushes after outside work, at bedtime and as needed  Serous Otitis Media Serous otitis media is fluid in the middle ear space. This space contains the bones for hearing and air. Air in the middle ear space helps to transmit sound.  The air gets there through the eustachian tube. This tube goes from the back of the nose (nasopharynx) to the middle ear space. It keeps the pressure in the middle ear the same as the outside world. It also helps to drain fluid from the middle ear space. CAUSES  Serous otitis media occurs when the eustachian tube gets blocked. Blockage can come from:  Ear infections.  Colds and other upper respiratory infections.  Allergies.  Irritants such as cigarette smoke.  Sudden changes in air pressure (such as descending in an airplane).  Enlarged adenoids.  A mass in the nasopharynx. During colds and upper respiratory infections, the middle ear space can become temporarily filled with fluid. This can happen after an ear infection also. Once the infection clears, the fluid will generally drain out of the ear through the eustachian tube. If it does not, then serous otitis media occurs. SIGNS AND SYMPTOMS   Hearing loss.  A feeling of fullness in the ear, without pain.  Young children may not show any symptoms but may show slight behavioral changes, such as agitation, ear pulling, or crying. DIAGNOSIS  Serous otitis media is diagnosed by an ear exam. Tests may be done to check on the movement of the eardrum. Hearing exams may also be done. TREATMENT  The fluid most often goes away without treatment. If allergy is the cause, allergy treatment may be helpful. Fluid that persists for several months may require minor surgery. A small tube is placed in the eardrum to:  Drain the fluid.  Restore the air in the middle ear space. In certain situations, antibiotic medicines are  used to avoid surgery. Surgery may be done to remove enlarged adenoids (if this is the cause). HOME CARE INSTRUCTIONS   Keep children away from tobacco smoke.  Keep all follow-up visits as directed by your health care provider. SEEK MEDICAL CARE IF:   Your hearing is not better in 3 months.  Your hearing is worse.  You have ear pain.  You have drainage from the ear.  You have dizziness.  You have serous otitis media only in one ear or have any bleeding from your nose (epistaxis).  You notice a lump on your neck. MAKE SURE YOU:  Understand these instructions.   Will watch your condition.   Will get help right away if you are not doing well or get worse.    This information is not intended to replace advice given to you by your health care provider. Make sure you discuss any questions you have with your health care provider.   Document Released: 12/12/2003 Document Revised: 10/12/2014 Document Reviewed: 04/18/2013 Elsevier Interactive Patient Education [employer].

## 2015-09-05 LAB — CULTURE, URINE COMPREHENSIVE

## 2015-09-06 ENCOUNTER — Other Ambulatory Visit: Payer: Self-pay | Admitting: Family Medicine

## 2015-09-06 MED ORDER — AMOXICILLIN 500 MG PO CAPS: 500.0000 mg | ORAL_CAPSULE | Freq: Three times a day (TID) | ORAL | Status: DC

## 2015-09-06 NOTE — Telephone Encounter (Signed)
Antibiotic sent in for UTI

## 2015-09-07 ENCOUNTER — Encounter: Payer: Self-pay | Admitting: Family Medicine

## 2015-09-07 DIAGNOSIS — N390 Urinary tract infection, site not specified: Secondary | ICD-10-CM | POA: Insufficient documentation

## 2015-09-07 NOTE — Assessment & Plan Note (Signed)
Wax noted in b/l ears. Patient will apply drops if no improvement then return for flushing.

## 2015-09-07 NOTE — Assessment & Plan Note (Signed)
Encouraged heart healthy diet, increase exercise, avoid trans fats, consider a krill oil cap daily 

## 2015-09-07 NOTE — Assessment & Plan Note (Signed)
minimize simple carbs. Increase exercise as tolerated.  

## 2015-09-07 NOTE — Assessment & Plan Note (Signed)
Started on probiotics and antibiotics

## 2015-09-07 NOTE — Assessment & Plan Note (Signed)
Improved on recheck no changes today 

## 2015-09-07 NOTE — Progress Notes (Signed)
Subjective:    Patient ID: Amber Morgan, female    DOB: Mar 06, 1927, 81 y.o.   MRN: [id number]  Chief Complaint  Patient presents with  . Tinnitus  . Urinary Frequency    HPI Patient is in today for evaluation of numerous concerns. They report some recent ringing and decreased hearing in bilateral areas of low the right is worse. Only minimal head congestion. No fevers or chills. Is also complaining of some urinary frequency most notably at night. No strict dysuria or urgency is noted. No headache but there is slight incontinence noted. No disequilibrium or neurologic complaints are noted. Flonase has been helpful for congestion. Denies CP/SOB/HA/congestion/fevers/GI or GU c/o. Taking meds as prescribed  Past Medical History  Diagnosis Date  . History of chicken pox     childhood  . Diverticulitis   . HTN (hypertension)   . Colon polyps   . Urinary incontinence   . Unspecified constipation 02/06/2013  . Nocturia 02/08/2013  . Viral infection characterized by skin and mucous membrane lesions 05/11/2013  . Personal history of colonic polyps 09/05/2013  . Hyperglycemia 09/05/2013  . Diverticulosis 01/04/2014  . Lipoma of arm 12/09/2014    right  . Osteopenia 12/09/2014  . Medicare annual wellness visit, subsequent 07/08/2015  . Urinary tract infection 09/07/2015    Past Surgical History  Procedure Laterality Date  . Eye surgery      multiple bilateral  . Bladder surgery  2003    bladder tact  . Abdominal hysterectomy    . Rectocele repair Bilateral     Family History  Problem Relation Age of Onset  . Colon cancer Mother   . Hypertension Mother   . Heart disease Father   . Meniere's disease Father   . Heart disease Sister   . Arthritis Son     back  . Glaucoma Son   . Hypertension Son   . Cataracts Son   . Colon cancer Sister   . Alzheimer's disease Sister   . Glaucoma Sister   . Glaucoma Sister   . Hyperlipidemia Sister   . Hypertension Sister   . Cancer Sister    thyroid  . Diabetes Sister   . Glaucoma Sister     Social History   Social History  . Marital Status: Married    Spouse Name: N/A  . Number of Children: N/A  . Years of Education: N/A   Occupational History  . Not on file.   Social History Main Topics  . Smoking status: Never Smoker   . Smokeless tobacco: Never Used  . Alcohol Use: No  . Drug Use: Not on file  . Sexual Activity: Not on file   Other Topics Concern  . Not on file   Social History Narrative    Outpatient Prescriptions Prior to Visit  Medication Sig Dispense Refill  . atenolol (TENORMIN) 25 MG tablet Take 0.5 tablets (12.5 mg total) by mouth 2 (two) times daily. (Patient taking differently: Take 12.5 mg by mouth daily. ) 45 tablet 1  . Calcium Citrate-Vitamin D (CALCIUM CITRATE + D PO) Take 1 tablet by mouth daily.    . fluticasone (FLONASE) 50 MCG/ACT nasal spray Place 2 sprays into both nostrils daily. 16 g 6  . hydrochlorothiazide (HYDRODIURIL) 25 MG tablet TAKE ONE TABLET BY MOUTH ONCE DAILY 90 tablet 0  . Multiple Vitamins-Minerals (ONE-A-DAY WOMENS 50 PLUS PO) Take 1 each by mouth daily.    . Probiotic Product (PROBIOTIC DAILY PO) Take by  mouth.    . timolol (BETIMOL) 0.5 % ophthalmic solution Place 1 drop into the left eye every morning.      No facility-administered medications prior to visit.    No Known Allergies  Review of Systems  Constitutional: Negative for fever.  HENT: Positive for hearing loss and tinnitus. Negative for congestion.   Eyes: Negative for discharge.  Respiratory: Negative for sputum production and shortness of breath.   Cardiovascular: Negative for chest pain, palpitations and leg swelling.  Gastrointestinal: Positive for abdominal pain. Negative for nausea.  Genitourinary: Negative for dysuria.  Musculoskeletal: Negative for falls.  Skin: Negative for rash.  Neurological: Positive for weakness. Negative for loss of consciousness and headaches.  Endo/Heme/Allergies:  Negative for environmental allergies.  Psychiatric/Behavioral: Negative for depression. The patient is not nervous/anxious.        Objective:    Physical Exam  Constitutional: She is oriented to person, place, and time. She appears well-developed and well-nourished. No distress.  HENT:  Head: Normocephalic and atraumatic.  Nose: Nose normal.  Cerumen noted in b/l canals  Eyes: Right eye exhibits no discharge. Left eye exhibits no discharge.  Neck: Normal range of motion. Neck supple.  Cardiovascular: Normal rate and regular rhythm.   No murmur heard. Pulmonary/Chest: Effort normal and breath sounds normal.  Abdominal: Soft. Bowel sounds are normal. There is no tenderness.  Musculoskeletal: She exhibits no edema.  Neurological: She is alert and oriented to person, place, and time.  Skin: Skin is warm and dry.  Psychiatric: She has a normal mood and affect.  Nursing note and vitals reviewed.   BP 140/84 mmHg  Pulse 57  Temp(Src) 98.1 F (36.7 C) (Oral)  Ht 5' (1.524 m)  Wt 123 lb 2 oz (55.849 kg)  BMI 24.05 kg/m2  SpO2 96% Wt Readings from Last 3 Encounters:  09/03/15 123 lb 2 oz (55.849 kg)  08/28/15 122 lb (55.339 kg)  07/08/15 123 lb (55.792 kg)     Lab Results  Component Value Date   WBC 5.2 07/02/2015   HGB 12.3 07/02/2015   HCT 37.1 07/02/2015   PLT 219.0 07/02/2015   GLUCOSE 127* 07/02/2015   CHOL 142 07/02/2015   TRIG 76.0 07/02/2015   HDL 55.50 07/02/2015   LDLCALC 71 07/02/2015   ALT 17 07/02/2015   Morgan 19 07/02/2015   NA 136 07/02/2015   K 4.1 07/02/2015   CL 97 07/02/2015   CREATININE 0.90 07/02/2015   BUN 25* 07/02/2015   CO2 30 07/02/2015   TSH 2.37 07/02/2015   HGBA1C 6.2 07/02/2015    Lab Results  Component Value Date   TSH 2.37 07/02/2015   Lab Results  Component Value Date   WBC 5.2 07/02/2015   HGB 12.3 07/02/2015   HCT 37.1 07/02/2015   MCV 94.9 07/02/2015   PLT 219.0 07/02/2015   Lab Results  Component Value Date   NA  136 07/02/2015   K 4.1 07/02/2015   CO2 30 07/02/2015   GLUCOSE 127* 07/02/2015   BUN 25* 07/02/2015   CREATININE 0.90 07/02/2015   BILITOT 0.6 07/02/2015   ALKPHOS 53 07/02/2015   Morgan 19 07/02/2015   ALT 17 07/02/2015   PROT 6.4 07/02/2015   ALBUMIN 3.8 07/02/2015   CALCIUM 10.0 07/02/2015   GFR 63.10 07/02/2015   Lab Results  Component Value Date   CHOL 142 07/02/2015   Lab Results  Component Value Date   HDL 55.50 07/02/2015   Lab Results  Component Value Date  Mabie 71 07/02/2015   Lab Results  Component Value Date   TRIG 76.0 07/02/2015   Lab Results  Component Value Date   CHOLHDL 3 07/02/2015   Lab Results  Component Value Date   HGBA1C 6.2 07/02/2015       Assessment & Plan:   Problem List Items Addressed This Visit    HTN (hypertension)    Improved on recheck no changes today      Hyperglycemia    minimize simple carbs. Increase exercise as tolerated.       Hyperlipidemia    Encouraged heart healthy diet, increase exercise, avoid trans fats, consider a krill oil cap daily      Tinnitus of right ear    Wax noted in b/l ears. Patient will apply drops if no improvement then return for flushing.      Urinary tract infection    Started on probiotics and antibiotics       Other Visit Diagnoses    Frequent urination    -  Primary    Relevant Orders    POCT Urinalysis Dipstick (Completed)    CULTURE, URINE COMPREHENSIVE (Completed)    SOM (secretory otitis media), bilateral        Relevant Orders    Ambulatory referral to ENT    Tinnitus, unspecified laterality        Relevant Orders    Ambulatory referral to ENT       I am having Ms. Donalds maintain her timolol, Multiple Vitamins-Minerals (ONE-A-DAY WOMENS 50 PLUS PO), Probiotic Product (PROBIOTIC DAILY PO), atenolol, Calcium Citrate-Vitamin D (CALCIUM CITRATE + D PO), hydrochlorothiazide, and fluticasone.  No orders of the defined types were placed in this encounter.      Penni Homans, MD

## 2015-10-03 ENCOUNTER — Encounter: Payer: Self-pay | Admitting: Internal Medicine

## 2015-10-03 ENCOUNTER — Ambulatory Visit (INDEPENDENT_AMBULATORY_CARE_PROVIDER_SITE_OTHER): Payer: Medicare Other | Admitting: Internal Medicine

## 2015-10-03 VITALS — BP 140/50 | HR 53 | Temp 97.3°F | Ht 60.0 in | Wt 122.0 lb

## 2015-10-03 DIAGNOSIS — T148 Other injury of unspecified body region: Secondary | ICD-10-CM

## 2015-10-03 DIAGNOSIS — W57XXXA Bitten or stung by nonvenomous insect and other nonvenomous arthropods, initial encounter: Secondary | ICD-10-CM | POA: Diagnosis not present

## 2015-10-03 MED ORDER — DOXYCYCLINE HYCLATE 100 MG PO CAPS: 100.0000 mg | ORAL_CAPSULE | Freq: Two times a day (BID) | ORAL | Status: DC

## 2015-10-03 NOTE — Progress Notes (Signed)
Subjective:    Patient ID: Amber Morgan, female    DOB: [birth year], 81 y.o.   MRN: [id number]  DOS:  10/03/2015 Type of visit - description : Acute Interval history: ~ 4 days ago noted a itchy spot on her back, on exam i noted a tick. She does not know where she acquired it although she did some yard work in the last couple of weeks. She does have a inside dog. She has not been outside [place] Denies fever, chills. No unusual aches or pains.   Past Medical History  Diagnosis Date  . History of chicken pox     childhood  . Diverticulitis   . HTN (hypertension)   . Colon polyps   . Urinary incontinence   . Unspecified constipation 02/06/2013  . Nocturia 02/08/2013  . Viral infection characterized by skin and mucous membrane lesions 05/11/2013  . Personal history of colonic polyps 09/05/2013  . Hyperglycemia 09/05/2013  . Diverticulosis 01/04/2014  . Lipoma of arm 12/09/2014    right  . Osteopenia 12/09/2014  . Medicare annual wellness visit, subsequent 07/08/2015  . Urinary tract infection 09/07/2015    Past Surgical History  Procedure Laterality Date  . Eye surgery      multiple bilateral  . Bladder surgery  2003    bladder tact  . Abdominal hysterectomy    . Rectocele repair Bilateral     Social History   Social History  . Marital Status: Married    Spouse Name: N/A  . Number of Children: N/A  . Years of Education: N/A   Occupational History  . Not on file.   Social History Main Topics  . Smoking status: Never Smoker   . Smokeless tobacco: Never Used  . Alcohol Use: No  . Drug Use: Not on file  . Sexual Activity: Not on file   Other Topics Concern  . Not on file   Social History Narrative        Medication List       This list is accurate as of: 10/03/15  6:05 PM.  Always use your most recent med list.               atenolol 25 MG tablet  Commonly known as:  TENORMIN  Take 0.5 tablets (12.5 mg total) by mouth 2  (two) times daily.     CALCIUM CITRATE + D PO  Take 1 tablet by mouth daily.     doxycycline 100 MG capsule  Commonly known as:  VIBRAMYCIN  Take 1 capsule (100 mg total) by mouth 2 (two) times daily.     hydrochlorothiazide 25 MG tablet  Commonly known as:  HYDRODIURIL  TAKE ONE TABLET BY MOUTH ONCE DAILY     ONE-A-DAY WOMENS 50 PLUS PO  Take 1 each by mouth daily.     PROBIOTIC DAILY PO  Take by mouth.     timolol 0.5 % ophthalmic solution  Commonly known as:  BETIMOL  Place 1 drop into the left eye every morning.           Objective:   Physical Exam  Constitutional: She is oriented to person, place, and time. She appears well-developed and well-nourished. No distress.  Neurological: She is alert and oriented to person, place, and time.  Skin: Skin is warm and dry. She is not diaphoretic.     Psychiatric: She has a normal mood and affect. Her behavior is normal. Judgment  and thought content normal.   BP 140/50 mmHg  Pulse 53  Temp(Src) 97.3 F (36.3 C) (Oral)  Ht 5' (1.524 m)  Wt 122 lb (55.339 kg)  BMI 23.83 kg/m2  SpO2 98%     Assessment & Plan:    Tick bite  In sterile fashion the engorged  tick was removed , a very small piece remains embedded in the skin, I was not able to retrieve it. Plan:  topical antibiotics and hydrocortisone. Doxycycline. If she has persistent itching she will let me know.

## 2015-10-03 NOTE — Patient Instructions (Signed)
Take the antibiotic as prescribed  Keep the area clean, dry, use an antibiotic ointment  Okay to use over-the-counter hydrocortisone cream as needed for itching  If you have fever, chills, the rash increases: Let me know.

## 2015-10-03 NOTE — Progress Notes (Signed)
Pre visit review using our clinic review tool, if applicable. No additional management support is needed unless otherwise documented below in the visit note. 

## 2015-11-01 ENCOUNTER — Other Ambulatory Visit (INDEPENDENT_AMBULATORY_CARE_PROVIDER_SITE_OTHER): Payer: Medicare Other

## 2015-11-01 DIAGNOSIS — I10 Essential (primary) hypertension: Secondary | ICD-10-CM

## 2015-11-01 DIAGNOSIS — E785 Hyperlipidemia, unspecified: Secondary | ICD-10-CM | POA: Diagnosis not present

## 2015-11-01 DIAGNOSIS — H409 Unspecified glaucoma: Secondary | ICD-10-CM

## 2015-11-01 DIAGNOSIS — D1720 Benign lipomatous neoplasm of skin and subcutaneous tissue of unspecified limb: Secondary | ICD-10-CM

## 2015-11-01 DIAGNOSIS — Z0000 Encounter for general adult medical examination without abnormal findings: Secondary | ICD-10-CM | POA: Diagnosis not present

## 2015-11-01 DIAGNOSIS — R739 Hyperglycemia, unspecified: Secondary | ICD-10-CM | POA: Diagnosis not present

## 2015-11-01 DIAGNOSIS — R351 Nocturia: Secondary | ICD-10-CM

## 2015-11-01 DIAGNOSIS — M8580 Other specified disorders of bone density and structure, unspecified site: Secondary | ICD-10-CM

## 2015-11-01 LAB — LIPID PANEL
CHOLESTEROL: 151 mg/dL (ref 0–200)
HDL: 53.3 mg/dL (ref >39.00)
LDL CALC: 84 mg/dL (ref 0–99)
NonHDL: 97.43
TRIGLYCERIDES: 66 mg/dL (ref 0.0–149.0)
Total CHOL/HDL Ratio: 3
VLDL: 13.2 mg/dL (ref 0.0–40.0)

## 2015-11-01 LAB — COMPREHENSIVE METABOLIC PANEL
ALBUMIN: 3.9 g/dL (ref 3.5–5.2)
ALT: 14 U/L (ref 0–35)
AST: 20 U/L (ref 0–37)
Alkaline Phosphatase: 45 U/L (ref 39–117)
BUN: 15 mg/dL (ref 6–23)
CALCIUM: 9.3 mg/dL (ref 8.4–10.5)
CHLORIDE: 96 meq/L (ref 96–112)
CO2: 32 mEq/L (ref 19–32)
CREATININE: 0.84 mg/dL (ref 0.40–1.20)
GFR: 68.27 mL/min (ref >60.00)
Glucose, Bld: 93 mg/dL (ref 70–99)
POTASSIUM: 3.3 meq/L — AB (ref 3.5–5.1)
Sodium: 132 mEq/L — ABNORMAL LOW (ref 135–145)
Total Bilirubin: 0.7 mg/dL (ref 0.2–1.2)
Total Protein: 6.3 g/dL (ref 6.0–8.3)

## 2015-11-01 LAB — CBC
HCT: 36.8 % (ref 36.0–46.0)
HEMOGLOBIN: 12.3 g/dL (ref 12.0–15.0)
MCHC: 33.4 g/dL (ref 30.0–36.0)
MCV: 94.5 fl (ref 78.0–100.0)
PLATELETS: 205 10*3/uL (ref 150.0–400.0)
RBC: 3.89 Mil/uL (ref 3.87–5.11)
RDW: 12.5 % (ref 11.5–15.5)
WBC: 5.7 10*3/uL (ref 4.0–10.5)

## 2015-11-01 LAB — TSH: TSH: 2.98 u[IU]/mL (ref 0.35–4.50)

## 2015-11-01 LAB — HEMOGLOBIN A1C: Hgb A1c MFr Bld: 6.2 % (ref 4.6–6.5)

## 2015-11-01 LAB — VITAMIN D 25 HYDROXY (VIT D DEFICIENCY, FRACTURES): VITD: 39.25 ng/mL (ref 30.00–100.00)

## 2015-11-01 NOTE — Addendum Note (Signed)
Addended by: Caffie Pinto on: 11/01/2015 03:14 PM   Modules accepted: Orders

## 2015-11-01 NOTE — Addendum Note (Signed)
Addended by: Peggyann Shoals on: 11/01/2015 11:22 AM   Modules accepted: Orders

## 2015-11-01 NOTE — Addendum Note (Signed)
Addended by: Caffie Pinto on: 11/01/2015 02:31 PM   Modules accepted: Orders

## 2015-11-02 LAB — MICROALBUMIN / CREATININE URINE RATIO
Creatinine, Urine: 33 mg/dL (ref 20–320)
Microalb Creat Ratio: 9 mcg/mg creat (ref <30)
Microalb, Ur: 0.3 mg/dL

## 2015-11-04 ENCOUNTER — Telehealth: Payer: Self-pay | Admitting: Family Medicine

## 2015-11-04 DIAGNOSIS — I10 Essential (primary) hypertension: Secondary | ICD-10-CM

## 2015-11-04 MED ORDER — ATENOLOL 25 MG PO TABS: 12.5000 mg | ORAL_TABLET | Freq: Two times a day (BID) | ORAL | Status: DC

## 2015-11-04 NOTE — Telephone Encounter (Signed)
Sent in as requested and patient is aware refill done.

## 2015-11-04 NOTE — Telephone Encounter (Signed)
Pharmacy: [id number] - [place], [place]  Reason for call: pt needing refill on atenolol. She will be out Wednesday. Takes 1/2 pill 2xday.

## 2015-11-08 ENCOUNTER — Ambulatory Visit (INDEPENDENT_AMBULATORY_CARE_PROVIDER_SITE_OTHER): Payer: Medicare Other | Admitting: Family Medicine

## 2015-11-08 ENCOUNTER — Encounter: Payer: Self-pay | Admitting: Family Medicine

## 2015-11-08 ENCOUNTER — Telehealth: Payer: Self-pay | Admitting: Family Medicine

## 2015-11-08 VITALS — BP 148/64 | HR 54 | Temp 97.8°F | Ht 60.0 in | Wt 121.1 lb

## 2015-11-08 DIAGNOSIS — I10 Essential (primary) hypertension: Secondary | ICD-10-CM

## 2015-11-08 DIAGNOSIS — E871 Hypo-osmolality and hyponatremia: Secondary | ICD-10-CM | POA: Diagnosis not present

## 2015-11-08 DIAGNOSIS — K5900 Constipation, unspecified: Secondary | ICD-10-CM | POA: Diagnosis not present

## 2015-11-08 DIAGNOSIS — E785 Hyperlipidemia, unspecified: Secondary | ICD-10-CM | POA: Diagnosis not present

## 2015-11-08 DIAGNOSIS — M8580 Other specified disorders of bone density and structure, unspecified site: Secondary | ICD-10-CM

## 2015-11-08 DIAGNOSIS — E876 Hypokalemia: Secondary | ICD-10-CM

## 2015-11-08 DIAGNOSIS — R739 Hyperglycemia, unspecified: Secondary | ICD-10-CM | POA: Diagnosis not present

## 2015-11-08 DIAGNOSIS — R001 Bradycardia, unspecified: Secondary | ICD-10-CM

## 2015-11-08 LAB — COMPREHENSIVE METABOLIC PANEL
ALT: 16 U/L (ref 0–35)
AST: 21 U/L (ref 0–37)
Albumin: 3.9 g/dL (ref 3.5–5.2)
Alkaline Phosphatase: 45 U/L (ref 39–117)
BUN: 19 mg/dL (ref 6–23)
CALCIUM: 10.1 mg/dL (ref 8.4–10.5)
CHLORIDE: 98 meq/L (ref 96–112)
CO2: 32 meq/L (ref 19–32)
CREATININE: 0.83 mg/dL (ref 0.40–1.20)
GFR: 69.22 mL/min (ref >60.00)
Glucose, Bld: 99 mg/dL (ref 70–99)
POTASSIUM: 4.3 meq/L (ref 3.5–5.1)
SODIUM: 135 meq/L (ref 135–145)
Total Bilirubin: 0.7 mg/dL (ref 0.2–1.2)
Total Protein: 6.5 g/dL (ref 6.0–8.3)

## 2015-11-08 MED ORDER — TRIAMTERENE-HCTZ 37.5-25 MG PO TABS: 1.0000 | ORAL_TABLET | Freq: Every day | ORAL | Status: DC

## 2015-11-08 NOTE — Telephone Encounter (Signed)
Made a copy of results, put at the front and called the patient informed results are ready for pickup

## 2015-11-08 NOTE — Progress Notes (Signed)
Pre visit review using our clinic review tool, if applicable. No additional management support is needed unless otherwise documented below in the visit note. 

## 2015-11-08 NOTE — Assessment & Plan Note (Addendum)
Encouraged heart healthy diet, increase exercise, avoid trans fats, consider a krill oil cap daily 

## 2015-11-08 NOTE — Patient Instructions (Addendum)
Metamucil, Benefiber daily, probiotics daily Hypokalemia  Hypokalemia means that the amount of potassium in the blood is lower than normal.Potassium is a chemical, called an electrolyte, that helps regulate the amount of fluid in the body. It also stimulates muscle contraction and helps nerves function properly.Most of the body's potassium is inside of cells, and only a very small amount is in the blood. Because the amount in the blood is so small, minor changes can be life-threatening. CAUSES  Antibiotics.  Diarrhea or vomiting.  Using laxatives too much, which can cause diarrhea.  Chronic kidney disease.  Water pills (diuretics).  Eating disorders (bulimia).  Low magnesium level.  Sweating a lot. SIGNS AND SYMPTOMS  Weakness.  Constipation.  Fatigue.  Muscle cramps.  Mental confusion.  Skipped heartbeats or irregular heartbeat (palpitations).  Tingling or numbness. DIAGNOSIS  Your health care provider can diagnose hypokalemia with blood tests. In addition to checking your potassium level, your health care provider may also check other lab tests. TREATMENT Hypokalemia can be treated with potassium supplements taken by mouth or adjustments in your current medicines. If your potassium level is very low, you may need to get potassium through a vein (IV) and be monitored in the hospital. A diet high in potassium is also helpful. Foods high in potassium are:  Nuts, such as peanuts and pistachios.  Seeds, such as sunflower seeds and pumpkin seeds.  Peas, lentils, and lima beans.  Whole grain and bran cereals and breads.  Fresh fruit and vegetables, such as apricots, avocado, bananas, cantaloupe, kiwi, oranges, tomatoes, asparagus, and potatoes.  Orange and tomato juices.  Red meats.  Fruit yogurt. HOME CARE INSTRUCTIONS  Take all medicines as prescribed by your health care provider.  Maintain a healthy diet by including nutritious food, such as fruits,  vegetables, nuts, whole grains, and lean meats.  If you are taking a laxative, be sure to follow the directions on the label. SEEK MEDICAL CARE IF:  Your weakness gets worse.  You feel your heart pounding or racing.  You are vomiting or having diarrhea.  You are diabetic and having trouble keeping your blood glucose in the normal range. SEEK IMMEDIATE MEDICAL CARE IF:  You have chest pain, shortness of breath, or dizziness.  You are vomiting or having diarrhea for more than 2 days.  You faint. MAKE SURE YOU:   Understand these instructions.  Will watch your condition.  Will get help right away if you are not doing well or get worse.   This information is not intended to replace advice given to you by your health care provider. Make sure you discuss any questions you have with your health care provider.   Document Released: 09/21/2005 Document Revised: 10/12/2014 Document Reviewed: 03/24/2013 Elsevier Interactive Patient Education [employer].

## 2015-11-08 NOTE — Telephone Encounter (Signed)
Pt says that she requested a copy of her lab results from her last visit and hasn't received them yet. Pt says that she would like to pick up today if possible.    Please call pt when ready for pick up   [phone]

## 2015-11-08 NOTE — Assessment & Plan Note (Addendum)
no changes to meds. Encouraged heart healthy diet such as the DASH diet and exercise as tolerated. Add Maxzide and patient referred to cardiology for further consideration secondary to bradycardia and elevated bp

## 2015-11-08 NOTE — Assessment & Plan Note (Signed)
Best source is the diet and a single dairy serving is about 500 mg, a supplement of calcium citrate once or twice daily to balance diet is fine if not getting enough in diet. Also need Vitamin D 2000 IU caps, 1 cap daily if not already taking vitamin D. Also recommend weight baring exercises

## 2015-11-08 NOTE — Assessment & Plan Note (Signed)
Given a list of potassium rich foods.

## 2015-11-08 NOTE — Assessment & Plan Note (Signed)
minimize simple carbs. Increase exercise as tolerated.  

## 2015-11-08 NOTE — Progress Notes (Signed)
Subjective:    Patient ID: Amber Morgan, female    DOB: March 19, 1927, 82 y.o.   MRN: [id number]  Chief Complaint  Patient presents with  . Follow-up    HPI Patient is in today for follow up. Is feeling well but does have a couple of concerns. She had a tick on her back and is concerned she did not get it all out. No warmth, pain or itching noted. No fever, HA, malaise. Has had some mild trouble with constipation lately. Had a period of 3-4 days without a bowel movement. Took a laxative and now her bowels are moving, no pain or blood. Denies CP/palp/SOB/HA/congestion/fevers/GI or GU c/o. Taking meds as prescribed  Past Medical History  Diagnosis Date  . History of chicken pox     childhood  . Diverticulitis   . HTN (hypertension)   . Colon polyps   . Urinary incontinence   . Unspecified constipation 02/06/2013  . Nocturia 02/08/2013  . Viral infection characterized by skin and mucous membrane lesions 05/11/2013  . Personal history of colonic polyps 09/05/2013  . Hyperglycemia 09/05/2013  . Diverticulosis 01/04/2014  . Lipoma of arm 12/09/2014    right  . Osteopenia 12/09/2014  . Medicare annual wellness visit, subsequent 07/08/2015  . Urinary tract infection 09/07/2015  . Hyponatremia 11/08/2015  . Hypokalemia 11/08/2015    Past Surgical History  Procedure Laterality Date  . Eye surgery      multiple bilateral  . Bladder surgery  2003    bladder tact  . Abdominal hysterectomy    . Rectocele repair Bilateral     Family History  Problem Relation Age of Onset  . Colon cancer Mother   . Hypertension Mother   . Heart disease Father   . Meniere's disease Father   . Heart disease Sister   . Arthritis Son     back  . Glaucoma Son   . Hypertension Son   . Cataracts Son   . Colon cancer Sister   . Alzheimer's disease Sister   . Glaucoma Sister   . Glaucoma Sister   . Hyperlipidemia Sister   . Hypertension Sister   . Cancer Sister     thyroid  . Diabetes Sister   . Glaucoma Sister      Social History   Social History  . Marital Status: Married    Spouse Name: N/A  . Number of Children: N/A  . Years of Education: N/A   Occupational History  . Not on file.   Social History Main Topics  . Smoking status: Never Smoker   . Smokeless tobacco: Never Used  . Alcohol Use: No  . Drug Use: Not on file  . Sexual Activity: Not on file   Other Topics Concern  . Not on file   Social History Narrative    Outpatient Prescriptions Prior to Visit  Medication Sig Dispense Refill  . atenolol (TENORMIN) 25 MG tablet Take 0.5 tablets (12.5 mg total) by mouth 2 (two) times daily. 90 tablet 1  . Calcium Citrate-Vitamin D (CALCIUM CITRATE + D PO) Take 1 tablet by mouth daily.    . Multiple Vitamins-Minerals (ONE-A-DAY WOMENS 50 PLUS PO) Take 1 each by mouth daily.    . Probiotic Product (PROBIOTIC DAILY PO) Take by mouth.    . timolol (BETIMOL) 0.5 % ophthalmic solution Place 1 drop into the left eye every morning.     Marland Kitchen doxycycline (VIBRAMYCIN) 100 MG capsule Take 1 capsule (100 mg total) by  mouth 2 (two) times daily. 20 capsule 0  . hydrochlorothiazide (HYDRODIURIL) 25 MG tablet TAKE ONE TABLET BY MOUTH ONCE DAILY 90 tablet 0   No facility-administered medications prior to visit.    No Known Allergies  Review of Systems  Constitutional: Negative for fever and malaise/fatigue.  HENT: Negative for congestion.   Eyes: Negative for discharge.  Respiratory: Negative for shortness of breath.   Cardiovascular: Negative for chest pain, palpitations and leg swelling.  Gastrointestinal: Positive for constipation. Negative for nausea and abdominal pain.  Genitourinary: Negative for dysuria.  Musculoskeletal: Negative for falls.  Skin: Negative for rash.  Neurological: Negative for loss of consciousness and headaches.  Endo/Heme/Allergies: Negative for environmental allergies.  Psychiatric/Behavioral: Negative for depression. The patient is not nervous/anxious.          Objective:    Physical Exam  Constitutional: She is oriented to person, place, and time. She appears well-developed and well-nourished. No distress.  HENT:  Head: Normocephalic and atraumatic.  Nose: Nose normal.  Eyes: Right eye exhibits no discharge. Left eye exhibits no discharge.  Neck: Normal range of motion. Neck supple.  Cardiovascular: Regular rhythm.   Murmur heard. bradycardia  Pulmonary/Chest: Effort normal and breath sounds normal.  Abdominal: Soft. Bowel sounds are normal. There is no tenderness.  Musculoskeletal: She exhibits no edema.  Neurological: She is alert and oriented to person, place, and time.  Skin: Skin is warm and dry.  Psychiatric: She has a normal mood and affect.  Nursing note and vitals reviewed.   BP 148/64 mmHg  Pulse 54  Temp(Src) 97.8 F (36.6 C) (Oral)  Ht 5' (1.524 m)  Wt 121 lb 2 oz (54.942 kg)  BMI 23.66 kg/m2  SpO2 99% Wt Readings from Last 3 Encounters:  11/08/15 121 lb 2 oz (54.942 kg)  10/03/15 122 lb (55.339 kg)  09/03/15 123 lb 2 oz (55.849 kg)     Lab Results  Component Value Date   WBC 5.7 11/01/2015   HGB 12.3 11/01/2015   HCT 36.8 11/01/2015   PLT 205.0 11/01/2015   GLUCOSE 99 11/08/2015   CHOL 151 11/01/2015   TRIG 66.0 11/01/2015   HDL 53.30 11/01/2015   LDLCALC 84 11/01/2015   ALT 16 11/08/2015   Morgan 21 11/08/2015   NA 135 11/08/2015   K 4.3 11/08/2015   CL 98 11/08/2015   CREATININE 0.83 11/08/2015   BUN 19 11/08/2015   CO2 32 11/08/2015   TSH 2.98 11/01/2015   HGBA1C 6.2 11/01/2015   MICROALBUR 0.3 11/01/2015    Lab Results  Component Value Date   TSH 2.98 11/01/2015   Lab Results  Component Value Date   WBC 5.7 11/01/2015   HGB 12.3 11/01/2015   HCT 36.8 11/01/2015   MCV 94.5 11/01/2015   PLT 205.0 11/01/2015   Lab Results  Component Value Date   NA 135 11/08/2015   K 4.3 11/08/2015   CO2 32 11/08/2015   GLUCOSE 99 11/08/2015   BUN 19 11/08/2015   CREATININE 0.83 11/08/2015    BILITOT 0.7 11/08/2015   ALKPHOS 45 11/08/2015   Morgan 21 11/08/2015   ALT 16 11/08/2015   PROT 6.5 11/08/2015   ALBUMIN 3.9 11/08/2015   CALCIUM 10.1 11/08/2015   GFR 69.22 11/08/2015   Lab Results  Component Value Date   CHOL 151 11/01/2015   Lab Results  Component Value Date   HDL 53.30 11/01/2015   Lab Results  Component Value Date   LDLCALC 84 11/01/2015  Lab Results  Component Value Date   TRIG 66.0 11/01/2015   Lab Results  Component Value Date   CHOLHDL 3 11/01/2015   Lab Results  Component Value Date   HGBA1C 6.2 11/01/2015       Assessment & Plan:   Problem List Items Addressed This Visit    Constipation - Primary    Encouraged increased hydration and fiber in diet. Daily probiotics. If bowels not moving can use MOM 2 tbls po in 4 oz of warm prune juice by mouth every 2-3 days. If no results then repeat in 4 hours with  Dulcolax suppository pr, may repeat again in 4 more hours as needed. Seek care if symptoms worsen. Consider daily Miralax and/or Dulcolax if symptoms persist. Is infrequent      HTN (hypertension)    no changes to meds. Encouraged heart healthy diet such as the DASH diet and exercise as tolerated. Add Maxzide and patient referred to cardiology for further consideration secondary to bradycardia and elevated bp      Relevant Medications   triamterene-hydrochlorothiazide (MAXZIDE-25) 37.5-25 MG tablet   Other Relevant Orders   Ambulatory referral to Cardiology   Hyperglycemia     minimize simple carbs. Increase exercise as tolerated.       Hyperlipidemia    Encouraged heart healthy diet, increase exercise, avoid trans fats, consider a krill oil cap daily      Relevant Medications   triamterene-hydrochlorothiazide (MAXZIDE-25) 37.5-25 MG tablet   Hypokalemia    Given a list of potassium rich foods.       Relevant Orders   Comprehensive metabolic panel (Completed)   Hyponatremia   Relevant Orders   Comprehensive metabolic panel  (Completed)   Osteopenia     Best source is the diet and a single dairy serving is about 500 mg, a supplement of calcium citrate once or twice daily to balance diet is fine if not getting enough in diet. Also need Vitamin D 2000 IU caps, 1 cap daily if not already taking vitamin D. Also recommend weight baring exercises       Other Visit Diagnoses    Bradycardia        Relevant Orders    Ambulatory referral to Cardiology       I have discontinued Ms. Freyre hydrochlorothiazide and doxycycline. I am also having her start on triamterene-hydrochlorothiazide. Additionally, I am having her maintain her timolol, Multiple Vitamins-Minerals (ONE-A-DAY WOMENS 50 PLUS PO), Probiotic Product (PROBIOTIC DAILY PO), Calcium Citrate-Vitamin D (CALCIUM CITRATE + D PO), and atenolol.  Meds ordered this encounter  Medications  . triamterene-hydrochlorothiazide (MAXZIDE-25) 37.5-25 MG tablet    Sig: Take 1 tablet by mouth daily.    Dispense:  30 tablet    Refill:  [place], MD

## 2015-11-17 NOTE — Assessment & Plan Note (Signed)
Encouraged increased hydration and fiber in diet. Daily probiotics. If bowels not moving can use MOM 2 tbls po in 4 oz of warm prune juice by mouth every 2-3 days. If no results then repeat in 4 hours with  Dulcolax suppository pr, may repeat again in 4 more hours as needed. Seek care if symptoms worsen. Consider daily Miralax and/or Dulcolax if symptoms persist. Is infrequent

## 2015-11-27 ENCOUNTER — Ambulatory Visit (INDEPENDENT_AMBULATORY_CARE_PROVIDER_SITE_OTHER): Payer: Medicare Other | Admitting: Cardiology

## 2015-11-27 ENCOUNTER — Encounter: Payer: Self-pay | Admitting: Cardiology

## 2015-11-27 VITALS — BP 157/56 | HR 56 | Ht 60.0 in | Wt 121.0 lb

## 2015-11-27 DIAGNOSIS — I10 Essential (primary) hypertension: Secondary | ICD-10-CM

## 2015-11-27 DIAGNOSIS — R001 Bradycardia, unspecified: Secondary | ICD-10-CM | POA: Insufficient documentation

## 2015-11-27 MED ORDER — AMLODIPINE BESYLATE 5 MG PO TABS: 5.0000 mg | ORAL_TABLET | Freq: Every day | ORAL | Status: DC

## 2015-11-27 NOTE — Assessment & Plan Note (Signed)
Blood pressure is mildly elevated and we are discontinuing atenolol because of bradycardia. Add amlodipine 5 mg daily and follow.

## 2015-11-27 NOTE — Assessment & Plan Note (Signed)
Continue present dose of diuretic. 

## 2015-11-27 NOTE — Progress Notes (Signed)
HPI: 82 year old female for evaluation of HTN and bradycardia. TSH January 2017 normal. Patient denies dyspnea on exertion, orthopnea, PND, palpitations, syncope or chest pain. Occasional mild pedal edema. Recent electrocardiogram showed bradycardia and cardiology asked to evaluate.  Current Outpatient Prescriptions  Medication Sig Dispense Refill  . atenolol (TENORMIN) 25 MG tablet Take 0.5 tablets (12.5 mg total) by mouth 2 (two) times daily. 90 tablet 1  . Calcium Citrate-Vitamin D (CALCIUM CITRATE + D PO) Take 1 tablet by mouth daily.    . Multiple Vitamins-Minerals (ONE-A-DAY WOMENS 50 PLUS PO) Take 1 each by mouth daily.    . Probiotic Product (PROBIOTIC DAILY PO) Take by mouth.    . timolol (BETIMOL) 0.5 % ophthalmic solution Place 1 drop into the left eye every morning.     . triamterene-hydrochlorothiazide (MAXZIDE-25) 37.5-25 MG tablet Take 1 tablet by mouth daily. 30 tablet 3   No current facility-administered medications for this visit.    No Known Allergies   Past Medical History  Diagnosis Date  . History of chicken pox     childhood  . Diverticulitis   . HTN (hypertension)   . Colon polyps   . Urinary incontinence   . Unspecified constipation 02/06/2013  . Nocturia 02/08/2013  . Viral infection characterized by skin and mucous membrane lesions 05/11/2013  . Personal history of colonic polyps 09/05/2013  . Hyperglycemia 09/05/2013  . Diverticulosis 01/04/2014  . Lipoma of arm 12/09/2014    right  . Osteopenia 12/09/2014    Past Surgical History  Procedure Laterality Date  . Eye surgery      multiple bilateral  . Bladder surgery  2003    bladder tact  . Abdominal hysterectomy    . Rectocele repair Bilateral     Social History   Social History  . Marital Status: Married    Spouse Name: N/A  . Number of Children: 4  . Years of Education: N/A   Occupational History  . Not on file.   Social History Main Topics  . Smoking status: Never Smoker   . Smokeless  tobacco: Never Used  . Alcohol Use: No  . Drug Use: Not on file  . Sexual Activity: Not on file   Other Topics Concern  . Not on file   Social History Narrative    Family History  Problem Relation Age of Onset  . Colon cancer Mother   . Hypertension Mother   . Heart disease Father   . Meniere's disease Father   . Heart disease Sister   . Arthritis Son     back  . Glaucoma Son   . Hypertension Son   . Cataracts Son   . Colon cancer Sister   . Alzheimer's disease Sister   . Glaucoma Sister   . Glaucoma Sister   . Hyperlipidemia Sister   . Hypertension Sister   . Cancer Sister     thyroid  . Diabetes Sister   . Glaucoma Sister     ROS: no fevers or chills, productive cough, hemoptysis, dysphasia, odynophagia, melena, hematochezia, dysuria, hematuria, rash, seizure activity, orthopnea, PND, pedal edema, claudication. Remaining systems are negative.  Physical Exam:   Blood pressure 157/56, pulse 56, height 5' (1.524 m), weight 121 lb (54.885 kg).  General:  Well developed/well nourished in NAD Skin warm/dry Patient not depressed No peripheral clubbing Back-normal HEENT-normal/normal eyelids Neck supple/normal carotid upstroke bilaterally; no bruits; no JVD; no thyromegaly chest - CTA/ normal expansion CV - regular rhythm,  bradycardic/normal S1 and S2; no murmurs, rubs or gallops;  PMI nondisplaced Abdomen -NT/ND, no HSM, no mass, + bowel sounds, no bruit 2+ femoral pulses, no bruits Ext-no edema, chords, 2+ DP Neuro-grossly nonfocal  ECG Marked sinus bradycardia at a rate of 45. No ST changes.

## 2015-11-27 NOTE — Patient Instructions (Signed)
Medication Instructions:   STOP ATENOLOL  START AMLODIPINE 5 MG ONCE DAILY  Follow-Up:  Your physician recommends that you schedule a follow-up appointment in: [place]

## 2015-11-27 NOTE — Assessment & Plan Note (Signed)
Patient was sinus bradycardia that is asymptomatic.Heart rate is in the 40s. Discontinue atenolol.

## 2015-12-06 ENCOUNTER — Ambulatory Visit: Payer: Medicare Other | Admitting: Family Medicine

## 2016-01-08 LAB — HM COLONOSCOPY

## 2016-01-28 ENCOUNTER — Telehealth: Payer: Self-pay | Admitting: Family Medicine

## 2016-01-28 DIAGNOSIS — I10 Essential (primary) hypertension: Secondary | ICD-10-CM

## 2016-01-28 DIAGNOSIS — R739 Hyperglycemia, unspecified: Secondary | ICD-10-CM

## 2016-01-28 DIAGNOSIS — E785 Hyperlipidemia, unspecified: Secondary | ICD-10-CM

## 2016-01-28 NOTE — Telephone Encounter (Signed)
Lipid, cmp, tsh, cbc, hgba1c for hyperglycemia, hyperlipidemia and HTN

## 2016-01-28 NOTE — Telephone Encounter (Signed)
Labs entered. Called the patient to inform, but line busy.

## 2016-01-28 NOTE — Telephone Encounter (Signed)
Relation to [id number] Call back number:[phone]   Reason for call:  Patient requesting pre visit labs prior to her 02/28/16 follow up appointment. Please advise

## 2016-01-28 NOTE — Telephone Encounter (Signed)
Patient scheduled labs for 02/20/16 (Thu) 8:45 AM

## 2016-02-06 ENCOUNTER — Ambulatory Visit: Payer: Medicare Other | Admitting: Family Medicine

## 2016-02-06 NOTE — Progress Notes (Signed)
HPI: FU HTN and bradycardia. TSH January 2017 normal. Noted to be bradycardic with heart rate in the 40s previously and atenolol discontinued. Amlodipine added. Since last seen, She denies dyspnea on exertion, orthopnea, PND, chest pain or syncope. Chronic mild pedal edema.  Current Outpatient Prescriptions  Medication Sig Dispense Refill  . amLODipine (NORVASC) 5 MG tablet Take 1 tablet (5 mg total) by mouth daily. 90 tablet 3  . Calcium Citrate-Vitamin D (CALCIUM CITRATE + D PO) Take 1 tablet by mouth daily.    . Multiple Vitamins-Minerals (ONE-A-DAY WOMENS 50 PLUS PO) Take 1 each by mouth daily.    . Probiotic Product (PROBIOTIC DAILY PO) Take by mouth.    . timolol (BETIMOL) 0.5 % ophthalmic solution Place 1 drop into the left eye every morning.     . triamterene-hydrochlorothiazide (MAXZIDE-25) 37.5-25 MG tablet Take 1 tablet by mouth daily. 30 tablet 3   No current facility-administered medications for this visit.     Past Medical History  Diagnosis Date  . History of chicken pox     childhood  . Diverticulitis   . HTN (hypertension)   . Colon polyps   . Urinary incontinence   . Unspecified constipation 02/06/2013  . Nocturia 02/08/2013  . Viral infection characterized by skin and mucous membrane lesions 05/11/2013  . Personal history of colonic polyps 09/05/2013  . Hyperglycemia 09/05/2013  . Diverticulosis 01/04/2014  . Lipoma of arm 12/09/2014    right  . Osteopenia 12/09/2014    Past Surgical History  Procedure Laterality Date  . Eye surgery      multiple bilateral  . Bladder surgery  2003    bladder tact  . Abdominal hysterectomy    . Rectocele repair Bilateral     Social History   Social History  . Marital Status: Married    Spouse Name: N/A  . Number of Children: 4  . Years of Education: N/A   Occupational History  . Not on file.   Social History Main Topics  . Smoking status: Never Smoker   . Smokeless tobacco: Never Used  . Alcohol Use: No  . Drug  Use: Not on file  . Sexual Activity: Not on file   Other Topics Concern  . Not on file   Social History Narrative    Family History  Problem Relation Age of Onset  . Colon cancer Mother   . Hypertension Mother   . Heart disease Father   . Meniere's disease Father   . Heart disease Sister   . Arthritis Son     back  . Glaucoma Son   . Hypertension Son   . Cataracts Son   . Colon cancer Sister   . Alzheimer's disease Sister   . Glaucoma Sister   . Glaucoma Sister   . Hyperlipidemia Sister   . Hypertension Sister   . Cancer Sister     thyroid  . Diabetes Sister   . Glaucoma Sister     ROS: Some fatigue but no fevers or chills, productive cough, hemoptysis, dysphasia, odynophagia, melena, hematochezia, dysuria, hematuria, rash, seizure activity, orthopnea, PND, claudication. Remaining systems are negative.  Physical Exam: Well-developed well-nourished in no acute distress.  Skin is warm and dry.  HEENT is normal.  Neck is supple.  Chest is clear to auscultation with normal expansion.  Cardiovascular exam is regular rate and rhythm.  Abdominal exam nontender or distended. No masses palpated. Extremities show trace edema. neuro grossly intact

## 2016-02-12 ENCOUNTER — Ambulatory Visit (INDEPENDENT_AMBULATORY_CARE_PROVIDER_SITE_OTHER): Payer: Medicare Other | Admitting: Cardiology

## 2016-02-12 ENCOUNTER — Encounter: Payer: Self-pay | Admitting: Cardiology

## 2016-02-12 VITALS — BP 123/64 | HR 71 | Ht 60.0 in | Wt 116.1 lb

## 2016-02-12 DIAGNOSIS — R001 Bradycardia, unspecified: Secondary | ICD-10-CM

## 2016-02-12 DIAGNOSIS — I10 Essential (primary) hypertension: Secondary | ICD-10-CM

## 2016-02-12 NOTE — Assessment & Plan Note (Signed)
Continue present dose of diuretic. 

## 2016-02-12 NOTE — Assessment & Plan Note (Signed)
Blood pressure controlled. Continue present medications. 

## 2016-02-12 NOTE — Patient Instructions (Signed)
Your physician wants you to follow-up in: ONE YEAR WITH DR CRENSHAW You will receive a reminder letter in the mail two months in advance. If you don't receive a letter, please call our office to schedule the follow-up appointment.   If you need a refill on your cardiac medications before your next appointment, please call your pharmacy.  

## 2016-02-12 NOTE — Assessment & Plan Note (Signed)
Improved after discontinuing beta blocker. Will avoid AV nodal blocking agents.

## 2016-02-20 ENCOUNTER — Other Ambulatory Visit: Payer: Self-pay | Admitting: Family Medicine

## 2016-02-20 ENCOUNTER — Other Ambulatory Visit (INDEPENDENT_AMBULATORY_CARE_PROVIDER_SITE_OTHER): Payer: Medicare Other

## 2016-02-20 ENCOUNTER — Other Ambulatory Visit: Payer: Medicare Other

## 2016-02-20 DIAGNOSIS — I10 Essential (primary) hypertension: Secondary | ICD-10-CM | POA: Diagnosis not present

## 2016-02-20 DIAGNOSIS — R739 Hyperglycemia, unspecified: Secondary | ICD-10-CM | POA: Diagnosis not present

## 2016-02-20 DIAGNOSIS — E785 Hyperlipidemia, unspecified: Secondary | ICD-10-CM

## 2016-02-20 LAB — CBC WITH DIFFERENTIAL/PLATELET
BASOS PCT: 0.5 % (ref 0.0–3.0)
Basophils Absolute: 0 10*3/uL (ref 0.0–0.1)
EOS PCT: 1.6 % (ref 0.0–5.0)
Eosinophils Absolute: 0.1 10*3/uL (ref 0.0–0.7)
HCT: 38 % (ref 36.0–46.0)
Hemoglobin: 12.9 g/dL (ref 12.0–15.0)
LYMPHS ABS: 1.6 10*3/uL (ref 0.7–4.0)
Lymphocytes Relative: 34.2 % (ref 12.0–46.0)
MCHC: 34 g/dL (ref 30.0–36.0)
MCV: 93.8 fl (ref 78.0–100.0)
MONO ABS: 0.5 10*3/uL (ref 0.1–1.0)
Monocytes Relative: 9.8 % (ref 3.0–12.0)
NEUTROS ABS: 2.6 10*3/uL (ref 1.4–7.7)
NEUTROS PCT: 53.9 % (ref 43.0–77.0)
PLATELETS: 231 10*3/uL (ref 150.0–400.0)
RBC: 4.05 Mil/uL (ref 3.87–5.11)
RDW: 13.4 % (ref 11.5–15.5)
WBC: 4.8 10*3/uL (ref 4.0–10.5)

## 2016-02-20 LAB — HEMOGLOBIN A1C: HEMOGLOBIN A1C: 6.2 % (ref 4.6–6.5)

## 2016-02-20 LAB — LIPID PANEL
CHOLESTEROL: 144 mg/dL (ref 0–200)
HDL: 53.2 mg/dL (ref >39.00)
LDL CALC: 79 mg/dL (ref 0–99)
NonHDL: 91.02
Total CHOL/HDL Ratio: 3
Triglycerides: 58 mg/dL (ref 0.0–149.0)
VLDL: 11.6 mg/dL (ref 0.0–40.0)

## 2016-02-20 LAB — COMPREHENSIVE METABOLIC PANEL
ALBUMIN: 4 g/dL (ref 3.5–5.2)
ALT: 20 U/L (ref 0–35)
AST: 22 U/L (ref 0–37)
Alkaline Phosphatase: 54 U/L (ref 39–117)
BUN: 19 mg/dL (ref 6–23)
CHLORIDE: 100 meq/L (ref 96–112)
CO2: 32 mEq/L (ref 19–32)
CREATININE: 0.81 mg/dL (ref 0.40–1.20)
Calcium: 10.3 mg/dL (ref 8.4–10.5)
GFR: 71.15 mL/min (ref >60.00)
Glucose, Bld: 93 mg/dL (ref 70–99)
Potassium: 3.3 mEq/L — ABNORMAL LOW (ref 3.5–5.1)
SODIUM: 137 meq/L (ref 135–145)
Total Bilirubin: 0.7 mg/dL (ref 0.2–1.2)
Total Protein: 6.3 g/dL (ref 6.0–8.3)

## 2016-02-20 LAB — TSH: TSH: 2.97 u[IU]/mL (ref 0.35–4.50)

## 2016-02-20 MED ORDER — POTASSIUM CHLORIDE CRYS ER 20 MEQ PO TBCR: 20.0000 meq | EXTENDED_RELEASE_TABLET | Freq: Every day | ORAL | Status: DC

## 2016-02-28 ENCOUNTER — Encounter: Payer: Self-pay | Admitting: Family Medicine

## 2016-02-28 ENCOUNTER — Ambulatory Visit (HOSPITAL_BASED_OUTPATIENT_CLINIC_OR_DEPARTMENT_OTHER): Payer: Medicare Other

## 2016-02-28 ENCOUNTER — Ambulatory Visit (INDEPENDENT_AMBULATORY_CARE_PROVIDER_SITE_OTHER): Payer: Medicare Other | Admitting: Family Medicine

## 2016-02-28 ENCOUNTER — Telehealth: Payer: Self-pay | Admitting: Family Medicine

## 2016-02-28 VITALS — BP 138/68 | HR 71 | Temp 97.7°F | Ht 60.0 in | Wt 116.4 lb

## 2016-02-28 DIAGNOSIS — R0989 Other specified symptoms and signs involving the circulatory and respiratory systems: Secondary | ICD-10-CM

## 2016-02-28 DIAGNOSIS — I10 Essential (primary) hypertension: Secondary | ICD-10-CM

## 2016-02-28 DIAGNOSIS — E876 Hypokalemia: Secondary | ICD-10-CM

## 2016-02-28 DIAGNOSIS — K5900 Constipation, unspecified: Secondary | ICD-10-CM

## 2016-02-28 DIAGNOSIS — R351 Nocturia: Secondary | ICD-10-CM

## 2016-02-28 DIAGNOSIS — R739 Hyperglycemia, unspecified: Secondary | ICD-10-CM

## 2016-02-28 DIAGNOSIS — R001 Bradycardia, unspecified: Secondary | ICD-10-CM

## 2016-02-28 DIAGNOSIS — E785 Hyperlipidemia, unspecified: Secondary | ICD-10-CM

## 2016-02-28 DIAGNOSIS — M8580 Other specified disorders of bone density and structure, unspecified site: Secondary | ICD-10-CM

## 2016-02-28 NOTE — Assessment & Plan Note (Signed)
Recurred with a blood draw, started on KCL will recheck today

## 2016-02-28 NOTE — Telephone Encounter (Signed)
Pt dropped off white envelope for PCP to read and have in her chart.

## 2016-02-28 NOTE — Progress Notes (Signed)
Pre visit review using our clinic review tool, if applicable. No additional management support is needed unless otherwise documented below in the visit note. 

## 2016-02-28 NOTE — Telephone Encounter (Signed)
Envelope was put on PCP desk

## 2016-03-04 ENCOUNTER — Ambulatory Visit (HOSPITAL_BASED_OUTPATIENT_CLINIC_OR_DEPARTMENT_OTHER): Payer: Medicare Other

## 2016-03-05 ENCOUNTER — Ambulatory Visit (HOSPITAL_BASED_OUTPATIENT_CLINIC_OR_DEPARTMENT_OTHER)
Admission: RE | Admit: 2016-03-05 | Discharge: 2016-03-05 | Disposition: A | Discharge status: Home / Self Care | Payer: Medicare Other | Source: Ambulatory Visit | Attending: Family Medicine | Admitting: Family Medicine

## 2016-03-05 DIAGNOSIS — I10 Essential (primary) hypertension: Secondary | ICD-10-CM | POA: Diagnosis not present

## 2016-03-05 DIAGNOSIS — R0989 Other specified symptoms and signs involving the circulatory and respiratory systems: Secondary | ICD-10-CM | POA: Diagnosis present

## 2016-03-05 DIAGNOSIS — R739 Hyperglycemia, unspecified: Secondary | ICD-10-CM | POA: Diagnosis not present

## 2016-03-05 DIAGNOSIS — E785 Hyperlipidemia, unspecified: Secondary | ICD-10-CM | POA: Insufficient documentation

## 2016-03-08 NOTE — Assessment & Plan Note (Signed)
Encouraged to get adequate exercise, calcium and vitamin d intake 

## 2016-03-08 NOTE — Assessment & Plan Note (Signed)
minimize simple carbs. Increase exercise as tolerated.  

## 2016-03-08 NOTE — Assessment & Plan Note (Signed)
Encouraged increased hydration and fiber in diet. Daily probiotics. If bowels not moving can use MOM 2 tbls po in 4 oz of warm prune juice by mouth every 2-3 days. If no results then repeat in 4 hours

## 2016-03-08 NOTE — Assessment & Plan Note (Signed)
RRR today 

## 2016-03-08 NOTE — Progress Notes (Signed)
Patient ID: Amber Morgan, female   DOB: 1927-02-25, 82 y.o.   MRN: [id number]   Subjective:    Patient ID: Amber Morgan, female    DOB: 03-18-1927, 82 y.o.   MRN: [id number]  Chief Complaint  Patient presents with  . Follow-up    HPI Patient is in today for follow up. She feels well today. She continues to have some congestion and pressure in ears. No hearing loss. No fevers. No recent hospitalization or acute illness. Denies CP/palp/SOB/HA/congestion/fevers/GI or GU c/o. Taking meds as prescribed  Past Medical History  Diagnosis Date  . History of chicken pox     childhood  . Diverticulitis   . HTN (hypertension)   . Colon polyps   . Urinary incontinence   . Unspecified constipation 02/06/2013  . Nocturia 02/08/2013  . Viral infection characterized by skin and mucous membrane lesions 05/11/2013  . Personal history of colonic polyps 09/05/2013  . Hyperglycemia 09/05/2013  . Diverticulosis 01/04/2014  . Lipoma of arm 12/09/2014    right  . Osteopenia 12/09/2014    Past Surgical History  Procedure Laterality Date  . Eye surgery      multiple bilateral  . Bladder surgery  2003    bladder tact  . Abdominal hysterectomy    . Rectocele repair Bilateral     Family History  Problem Relation Age of Onset  . Colon cancer Mother   . Hypertension Mother   . Heart disease Father   . Meniere's disease Father   . Heart disease Sister   . Arthritis Son     back  . Glaucoma Son   . Hypertension Son   . Cataracts Son   . Colon cancer Sister   . Alzheimer's disease Sister   . Glaucoma Sister   . Glaucoma Sister   . Hyperlipidemia Sister   . Hypertension Sister   . Cancer Sister     thyroid  . Diabetes Sister   . Glaucoma Sister     Social History   Social History  . Marital Status: Married    Spouse Name: N/A  . Number of Children: 4  . Years of Education: N/A   Occupational History  . Not on file.   Social History Main Topics  . Smoking status: Never Smoker   .  Smokeless tobacco: Never Used  . Alcohol Use: No  . Drug Use: Not on file  . Sexual Activity: Not on file   Other Topics Concern  . Not on file   Social History Narrative    Outpatient Prescriptions Prior to Visit  Medication Sig Dispense Refill  . amLODipine (NORVASC) 5 MG tablet Take 1 tablet (5 mg total) by mouth daily. 90 tablet 3  . Calcium Citrate-Vitamin D (CALCIUM CITRATE + D PO) Take 1 tablet by mouth daily.    . Multiple Vitamins-Minerals (ONE-A-DAY WOMENS 50 PLUS PO) Take 1 each by mouth daily.    . potassium chloride SA (K-DUR,KLOR-CON) 20 MEQ tablet Take 1 tablet (20 mEq total) by mouth daily. 30 tablet 1  . Probiotic Product (PROBIOTIC DAILY PO) Take by mouth.    . timolol (BETIMOL) 0.5 % ophthalmic solution Place 1 drop into the left eye every morning.     . triamterene-hydrochlorothiazide (MAXZIDE-25) 37.5-25 MG tablet Take 1 tablet by mouth daily. 30 tablet 3   No facility-administered medications prior to visit.    No Known Allergies  Review of Systems  Constitutional: Negative for fever and malaise/fatigue.  HENT: Negative  for congestion.   Eyes: Negative for blurred vision.  Respiratory: Negative for shortness of breath.   Cardiovascular: Negative for chest pain, palpitations and leg swelling.  Gastrointestinal: Negative for nausea, abdominal pain and blood in stool.  Genitourinary: Negative for dysuria and frequency.  Musculoskeletal: Negative for falls.  Skin: Negative for rash.  Neurological: Negative for dizziness, loss of consciousness and headaches.  Endo/Heme/Allergies: Negative for environmental allergies.  Psychiatric/Behavioral: Negative for depression. The patient is not nervous/anxious.        Objective:    Physical Exam  Constitutional: She is oriented to person, place, and time. She appears well-developed and well-nourished. No distress.  HENT:  Head: Normocephalic and atraumatic.  Nose: Nose normal.  Eyes: Right eye exhibits no  discharge. Left eye exhibits no discharge.  Neck: Normal range of motion. Neck supple.  Cardiovascular: Normal rate and regular rhythm.   Pulmonary/Chest: Effort normal and breath sounds normal.  Abdominal: Soft. Bowel sounds are normal. There is no tenderness.  Musculoskeletal: She exhibits no edema.  Neurological: She is alert and oriented to person, place, and time.  Skin: Skin is warm and dry.  Psychiatric: She has a normal mood and affect.  Nursing note and vitals reviewed.   BP 138/68 mmHg  Pulse 71  Temp(Src) 97.7 F (36.5 C) (Oral)  Ht 5' (1.524 m)  Wt 116 lb 6 oz (52.787 kg)  BMI 22.73 kg/m2  SpO2 94% Wt Readings from Last 3 Encounters:  02/28/16 116 lb 6 oz (52.787 kg)  02/12/16 116 lb 1.9 oz (52.672 kg)  11/27/15 121 lb (54.885 kg)     Lab Results  Component Value Date   WBC 4.8 02/20/2016   HGB 12.9 02/20/2016   HCT 38.0 02/20/2016   PLT 231.0 02/20/2016   GLUCOSE 93 02/20/2016   CHOL 144 02/20/2016   TRIG 58.0 02/20/2016   HDL 53.20 02/20/2016   LDLCALC 79 02/20/2016   ALT 20 02/20/2016   Morgan 22 02/20/2016   NA 137 02/20/2016   K 3.3* 02/20/2016   CL 100 02/20/2016   CREATININE 0.81 02/20/2016   BUN 19 02/20/2016   CO2 32 02/20/2016   TSH 2.97 02/20/2016   HGBA1C 6.2 02/20/2016   MICROALBUR 0.3 11/01/2015    Lab Results  Component Value Date   TSH 2.97 02/20/2016   Lab Results  Component Value Date   WBC 4.8 02/20/2016   HGB 12.9 02/20/2016   HCT 38.0 02/20/2016   MCV 93.8 02/20/2016   PLT 231.0 02/20/2016   Lab Results  Component Value Date   NA 137 02/20/2016   K 3.3* 02/20/2016   CO2 32 02/20/2016   GLUCOSE 93 02/20/2016   BUN 19 02/20/2016   CREATININE 0.81 02/20/2016   BILITOT 0.7 02/20/2016   ALKPHOS 54 02/20/2016   Morgan 22 02/20/2016   ALT 20 02/20/2016   PROT 6.3 02/20/2016   ALBUMIN 4.0 02/20/2016   CALCIUM 10.3 02/20/2016   GFR 71.15 02/20/2016   Lab Results  Component Value Date   CHOL 144 02/20/2016   Lab  Results  Component Value Date   HDL 53.20 02/20/2016   Lab Results  Component Value Date   LDLCALC 79 02/20/2016   Lab Results  Component Value Date   TRIG 58.0 02/20/2016   Lab Results  Component Value Date   CHOLHDL 3 02/20/2016   Lab Results  Component Value Date   HGBA1C 6.2 02/20/2016       Assessment & Plan:   Problem List  Items Addressed This Visit    Osteopenia    Encouraged to get adequate exercise, calcium and vitamin d intake      Nocturia   Relevant Orders   TSH   Hemoglobin A1c   Microalbumin / creatinine urine ratio   Comprehensive metabolic panel   Lipid panel   Hypokalemia - Primary    Recurred with a blood draw, started on KCL will recheck today      Relevant Orders   Comp Met (CMET)   TSH   Hemoglobin A1c   Microalbumin / creatinine urine ratio   Comprehensive metabolic panel   Lipid panel   Hyperlipidemia   Relevant Orders   US Carotid Duplex Bilateral (Completed)   TSH   Hemoglobin A1c   Microalbumin / creatinine urine ratio   Comprehensive metabolic panel   Lipid panel   Hyperglycemia    minimize simple carbs. Increase exercise as tolerated.       Relevant Orders   US Carotid Duplex Bilateral (Completed)   TSH   Hemoglobin A1c   Microalbumin / creatinine urine ratio   Comprehensive metabolic panel   Lipid panel   HTN (hypertension)    Well controlled, no changes to meds. Encouraged heart healthy diet such as the DASH diet and exercise as tolerated.       Relevant Orders   US Carotid Duplex Bilateral (Completed)   TSH   Hemoglobin A1c   Microalbumin / creatinine urine ratio   Comprehensive metabolic panel   Lipid panel   Constipation    Encouraged increased hydration and fiber in diet. Daily probiotics. If bowels not moving can use MOM 2 tbls po in 4 oz of warm prune juice by mouth every 2-3 days. If no results then repeat in 4 hours      Relevant Orders   TSH   Hemoglobin A1c   Microalbumin / creatinine urine  ratio   Comprehensive metabolic panel   Lipid panel   Bradycardia    RRR today       Other Visit Diagnoses    Bruit        Relevant Orders    US Carotid Duplex Bilateral (Completed)    TSH    Hemoglobin A1c    Microalbumin / creatinine urine ratio    Comprehensive metabolic panel    Lipid panel       I am having Ms. Hodgkins maintain her timolol, Multiple Vitamins-Minerals (ONE-A-DAY WOMENS 50 PLUS PO), Probiotic Product (PROBIOTIC DAILY PO), Calcium Citrate-Vitamin D (CALCIUM CITRATE + D PO), triamterene-hydrochlorothiazide, amLODipine, potassium chloride SA, and psyllium.  Meds ordered this encounter  Medications  . psyllium (METAMUCIL) 58.6 % packet    Sig: Take 1 packet by mouth daily.     Penni Homans, MD

## 2016-03-08 NOTE — Assessment & Plan Note (Signed)
Well controlled, no changes to meds. Encouraged heart healthy diet such as the DASH diet and exercise as tolerated.  °

## 2016-03-09 ENCOUNTER — Other Ambulatory Visit: Payer: Self-pay | Admitting: Family Medicine

## 2016-04-20 ENCOUNTER — Other Ambulatory Visit: Payer: Self-pay | Admitting: Family Medicine

## 2016-06-22 ENCOUNTER — Ambulatory Visit (INDEPENDENT_AMBULATORY_CARE_PROVIDER_SITE_OTHER): Payer: Medicare Other

## 2016-06-22 DIAGNOSIS — Z23 Encounter for immunization: Secondary | ICD-10-CM

## 2016-06-22 DIAGNOSIS — R350 Frequency of micturition: Secondary | ICD-10-CM | POA: Diagnosis not present

## 2016-06-22 LAB — POC URINALSYSI DIPSTICK (AUTOMATED)
BILIRUBIN UA: NEGATIVE
GLUCOSE UA: NEGATIVE
Ketones, UA: NEGATIVE
LEUKOCYTES UA: NEGATIVE
NITRITE UA: NEGATIVE
Protein, UA: NEGATIVE
RBC UA: NEGATIVE
Spec Grav, UA: 1.025
Urobilinogen, UA: 4
pH, UA: 6

## 2016-06-22 NOTE — Addendum Note (Signed)
Addended by: Magdalene Molly A on: 06/22/2016 11:28 AM   Modules accepted: Orders

## 2016-08-05 ENCOUNTER — Other Ambulatory Visit: Payer: Medicare Other

## 2016-08-06 ENCOUNTER — Other Ambulatory Visit (INDEPENDENT_AMBULATORY_CARE_PROVIDER_SITE_OTHER): Payer: Medicare Other

## 2016-08-06 DIAGNOSIS — R0989 Other specified symptoms and signs involving the circulatory and respiratory systems: Secondary | ICD-10-CM

## 2016-08-06 DIAGNOSIS — K5900 Constipation, unspecified: Secondary | ICD-10-CM

## 2016-08-06 DIAGNOSIS — I10 Essential (primary) hypertension: Secondary | ICD-10-CM

## 2016-08-06 DIAGNOSIS — E876 Hypokalemia: Secondary | ICD-10-CM | POA: Diagnosis not present

## 2016-08-06 DIAGNOSIS — R351 Nocturia: Secondary | ICD-10-CM

## 2016-08-06 DIAGNOSIS — R739 Hyperglycemia, unspecified: Secondary | ICD-10-CM

## 2016-08-06 DIAGNOSIS — E785 Hyperlipidemia, unspecified: Secondary | ICD-10-CM

## 2016-08-06 LAB — MICROALBUMIN / CREATININE URINE RATIO
Creatinine,U: 63.7 mg/dL
MICROALB/CREAT RATIO: 1.3 mg/g (ref 0.0–30.0)
Microalb, Ur: 0.8 mg/dL (ref 0.0–1.9)

## 2016-08-06 LAB — COMPREHENSIVE METABOLIC PANEL
ALK PHOS: 59 U/L (ref 39–117)
ALT: 14 U/L (ref 0–35)
AST: 19 U/L (ref 0–37)
Albumin: 3.9 g/dL (ref 3.5–5.2)
BILIRUBIN TOTAL: 0.6 mg/dL (ref 0.2–1.2)
BUN: 17 mg/dL (ref 6–23)
CO2: 30 mEq/L (ref 19–32)
CREATININE: 0.81 mg/dL (ref 0.40–1.20)
Calcium: 9.5 mg/dL (ref 8.4–10.5)
Chloride: 101 mEq/L (ref 96–112)
GFR: 71.07 mL/min (ref >60.00)
GLUCOSE: 97 mg/dL (ref 70–99)
Potassium: 4.1 mEq/L (ref 3.5–5.1)
Sodium: 136 mEq/L (ref 135–145)
TOTAL PROTEIN: 6.4 g/dL (ref 6.0–8.3)

## 2016-08-06 LAB — LIPID PANEL
Cholesterol: 141 mg/dL (ref 0–200)
HDL: 65 mg/dL (ref >39.00)
LDL Cholesterol: 65 mg/dL (ref 0–99)
NONHDL: 76.25
Total CHOL/HDL Ratio: 2
Triglycerides: 58 mg/dL (ref 0.0–149.0)
VLDL: 11.6 mg/dL (ref 0.0–40.0)

## 2016-08-06 LAB — HEMOGLOBIN A1C: Hgb A1c MFr Bld: 5.9 % (ref 4.6–6.5)

## 2016-08-06 LAB — TSH: TSH: 1.33 u[IU]/mL (ref 0.35–4.50)

## 2016-08-07 ENCOUNTER — Encounter: Payer: Medicare Other | Admitting: Family Medicine

## 2016-08-14 ENCOUNTER — Encounter: Payer: Self-pay | Admitting: Family Medicine

## 2016-08-14 ENCOUNTER — Ambulatory Visit (INDEPENDENT_AMBULATORY_CARE_PROVIDER_SITE_OTHER): Payer: Medicare Other | Admitting: Family Medicine

## 2016-08-14 DIAGNOSIS — M8580 Other specified disorders of bone density and structure, unspecified site: Secondary | ICD-10-CM | POA: Diagnosis not present

## 2016-08-14 DIAGNOSIS — Z0000 Encounter for general adult medical examination without abnormal findings: Secondary | ICD-10-CM | POA: Diagnosis not present

## 2016-08-14 DIAGNOSIS — E785 Hyperlipidemia, unspecified: Secondary | ICD-10-CM

## 2016-08-14 DIAGNOSIS — I10 Essential (primary) hypertension: Secondary | ICD-10-CM

## 2016-08-14 HISTORY — DX: Encounter for general adult medical examination without abnormal findings: Z00.00

## 2016-08-14 NOTE — Assessment & Plan Note (Signed)
Well controlled, no changes to meds. Encouraged heart healthy diet such as the DASH diet and exercise as tolerated.  °

## 2016-08-14 NOTE — Patient Instructions (Signed)
Preventive Care for Adults, Female A healthy lifestyle and preventive care can promote health and wellness. Preventive health guidelines for women include the following key practices.  A routine yearly physical is a good way to check with your health care provider about your health and preventive screening. It is a chance to share any concerns and updates on your health and to receive a thorough exam.  Visit your dentist for a routine exam and preventive care every 6 months. Brush your teeth twice a day and floss once a day. Good oral hygiene prevents tooth decay and gum disease.  The frequency of eye exams is based on your age, health, family medical history, use of contact lenses, and other factors. Follow your health care provider's recommendations for frequency of eye exams.  Eat a healthy diet. Foods like vegetables, fruits, whole grains, low-fat dairy products, and lean protein foods contain the nutrients you need without too many calories. Decrease your intake of foods high in solid fats, added sugars, and salt. Eat the right amount of calories for you.Get information about a proper diet from your health care provider, if necessary.  Regular physical exercise is one of the most important things you can do for your health. Most adults should get at least 150 minutes of moderate-intensity exercise (any activity that increases your heart rate and causes you to sweat) each week. In addition, most adults need muscle-strengthening exercises on 2 or more days a week.  Maintain a healthy weight. The body mass index (BMI) is a screening tool to identify possible weight problems. It provides an estimate of body fat based on height and weight. Your health care provider can find your BMI and can help you achieve or maintain a healthy weight.For adults 20 years and older:  A BMI below 18.5 is considered underweight.  A BMI of 18.5 to 24.9 is normal.  A BMI of 25 to 29.9 is considered overweight.  A  BMI of 30 and above is considered obese.  Maintain normal blood lipids and cholesterol levels by exercising and minimizing your intake of saturated fat. Eat a balanced diet with plenty of fruit and vegetables. Blood tests for lipids and cholesterol should begin at age 45 and be repeated every 5 years. If your lipid or cholesterol levels are high, you are over 50, or you are at high risk for heart disease, you may need your cholesterol levels checked more frequently.Ongoing high lipid and cholesterol levels should be treated with medicines if diet and exercise are not working.  If you smoke, find out from your health care provider how to quit. If you do not use tobacco, do not start.  Lung cancer screening is recommended for adults aged 45-80 years who are at high risk for developing lung cancer because of a history of smoking. A yearly low-dose CT scan of the lungs is recommended for people who have at least a 30-pack-year history of smoking and are a current smoker or have quit within the past 15 years. A pack year of smoking is smoking an average of 1 pack of cigarettes a day for 1 year (for example: 1 pack a day for 30 years or 2 packs a day for 15 years). Yearly screening should continue until the smoker has stopped smoking for at least 15 years. Yearly screening should be stopped for people who develop a health problem that would prevent them from having lung cancer treatment.  If you are pregnant, do not drink alcohol. If you are  breastfeeding, be very cautious about drinking alcohol. If you are not pregnant and choose to drink alcohol, do not have more than 1 drink per day. One drink is considered to be 12 ounces (355 mL) of beer, 5 ounces (148 mL) of wine, or 1.5 ounces (44 mL) of liquor.  Avoid use of street drugs. Do not share needles with anyone. Ask for help if you need support or instructions about stopping the use of drugs.  High blood pressure causes heart disease and increases the risk  of stroke. Your blood pressure should be checked at least every 1 to 2 years. Ongoing high blood pressure should be treated with medicines if weight loss and exercise do not work.  If you are 55-79 years old, ask your health care provider if you should take aspirin to prevent strokes.  Diabetes screening is done by taking a blood sample to check your blood glucose level after you have not eaten for a certain period of time (fasting). If you are not overweight and you do not have risk factors for diabetes, you should be screened once every 3 years starting at age 45. If you are overweight or obese and you are 40-70 years of age, you should be screened for diabetes every year as part of your cardiovascular risk assessment.  Breast cancer screening is essential preventive care for women. You should practice "breast self-awareness." This means understanding the normal appearance and feel of your breasts and may include breast self-examination. Any changes detected, no matter how small, should be reported to a health care provider. Women in their 20s and 30s should have a clinical breast exam (CBE) by a health care provider as part of a regular health exam every 1 to 3 years. After age 40, women should have a CBE every year. Starting at age 40, women should consider having a mammogram (breast X-ray test) every year. Women who have a family history of breast cancer should talk to their health care provider about genetic screening. Women at a high risk of breast cancer should talk to their health care providers about having an MRI and a mammogram every year.  Breast cancer gene (BRCA)-related cancer risk assessment is recommended for women who have family members with BRCA-related cancers. BRCA-related cancers include breast, ovarian, tubal, and peritoneal cancers. Having family members with these cancers may be associated with an increased risk for harmful changes (mutations) in the breast cancer genes BRCA1 and  BRCA2. Results of the assessment will determine the need for genetic counseling and BRCA1 and BRCA2 testing.  Your health care provider may recommend that you be screened regularly for cancer of the pelvic organs (ovaries, uterus, and vagina). This screening involves a pelvic examination, including checking for microscopic changes to the surface of your cervix (Pap test). You may be encouraged to have this screening done every 3 years, beginning at age 21.  For women ages 30-65, health care providers may recommend pelvic exams and Pap testing every 3 years, or they may recommend the Pap and pelvic exam, combined with testing for human papilloma virus (HPV), every 5 years. Some types of HPV increase your risk of cervical cancer. Testing for HPV may also be done on women of any age with unclear Pap test results.  Other health care providers may not recommend any screening for nonpregnant women who are considered low risk for pelvic cancer and who do not have symptoms. Ask your health care provider if a screening pelvic exam is right for   you.  If you have had past treatment for cervical cancer or a condition that could lead to cancer, you need Pap tests and screening for cancer for at least 20 years after your treatment. If Pap tests have been discontinued, your risk factors (such as having a new sexual partner) need to be reassessed to determine if screening should resume. Some women have medical problems that increase the chance of getting cervical cancer. In these cases, your health care provider may recommend more frequent screening and Pap tests.  Colorectal cancer can be detected and often prevented. Most routine colorectal cancer screening begins at the age of 50 years and continues through age 75 years. However, your health care provider may recommend screening at an earlier age if you have risk factors for colon cancer. On a yearly basis, your health care provider may provide home test kits to check  for hidden blood in the stool. Use of a small camera at the end of a tube, to directly examine the colon (sigmoidoscopy or colonoscopy), can detect the earliest forms of colorectal cancer. Talk to your health care provider about this at age 50, when routine screening begins. Direct exam of the colon should be repeated every 5-10 years through age 75 years, unless early forms of precancerous polyps or small growths are found.  People who are at an increased risk for hepatitis B should be screened for this virus. You are considered at high risk for hepatitis B if:  You were born in a country where hepatitis B occurs often. Talk with your health care provider about which countries are considered high risk.  Your parents were born in a high-risk country and you have not received a shot to protect against hepatitis B (hepatitis B vaccine).  You have HIV or AIDS.  You use needles to inject street drugs.  You live with, or have sex with, someone who has hepatitis B.  You get hemodialysis treatment.  You take certain medicines for conditions like cancer, organ transplantation, and autoimmune conditions.  Hepatitis C blood testing is recommended for all people born from 1945 through 1965 and any individual with known risks for hepatitis C.  Practice safe sex. Use condoms and avoid high-risk sexual practices to reduce the spread of sexually transmitted infections (STIs). STIs include gonorrhea, chlamydia, syphilis, trichomonas, herpes, HPV, and human immunodeficiency virus (HIV). Herpes, HIV, and HPV are viral illnesses that have no cure. They can result in disability, cancer, and death.  You should be screened for sexually transmitted illnesses (STIs) including gonorrhea and chlamydia if:  You are sexually active and are younger than 24 years.  You are older than 24 years and your health care provider tells you that you are at risk for this type of infection.  Your sexual activity has changed  since you were last screened and you are at an increased risk for chlamydia or gonorrhea. Ask your health care provider if you are at risk.  If you are at risk of being infected with HIV, it is recommended that you take a prescription medicine daily to prevent HIV infection. This is called preexposure prophylaxis (PrEP). You are considered at risk if:  You are sexually active and do not regularly use condoms or know the HIV status of your partner(s).  You take drugs by injection.  You are sexually active with a partner who has HIV.  Talk with your health care provider about whether you are at high risk of being infected with HIV. If   you choose to begin PrEP, you should first be tested for HIV. You should then be tested every 3 months for as long as you are taking PrEP.  Osteoporosis is a disease in which the bones lose minerals and strength with aging. This can result in serious bone fractures or breaks. The risk of osteoporosis can be identified using a bone density scan. Women ages 67 years and over and women at risk for fractures or osteoporosis should discuss screening with their health care providers. Ask your health care provider whether you should take a calcium supplement or vitamin D to reduce the rate of osteoporosis.  Menopause can be associated with physical symptoms and risks. Hormone replacement therapy is available to decrease symptoms and risks. You should talk to your health care provider about whether hormone replacement therapy is right for you.  Use sunscreen. Apply sunscreen liberally and repeatedly throughout the day. You should seek shade when your shadow is shorter than you. Protect yourself by wearing long sleeves, pants, a wide-brimmed hat, and sunglasses year round, whenever you are outdoors.  Once a month, do a whole body skin exam, using a mirror to look at the skin on your back. Tell your health care provider of new moles, moles that have irregular borders, moles that  are larger than a pencil eraser, or moles that have changed in shape or color.  Stay current with required vaccines (immunizations).  Influenza vaccine. All adults should be immunized every year.  Tetanus, diphtheria, and acellular pertussis (Td, Tdap) vaccine. Pregnant women should receive 1 dose of Tdap vaccine during each pregnancy. The dose should be obtained regardless of the length of time since the last dose. Immunization is preferred during the 27th-36th week of gestation. An adult who has not previously received Tdap or who does not know her vaccine status should receive 1 dose of Tdap. This initial dose should be followed by tetanus and diphtheria toxoids (Td) booster doses every 10 years. Adults with an unknown or incomplete history of completing a 3-dose immunization series with Td-containing vaccines should begin or complete a primary immunization series including a Tdap dose. Adults should receive a Td booster every 10 years.  Varicella vaccine. An adult without evidence of immunity to varicella should receive 2 doses or a second dose if she has previously received 1 dose. Pregnant females who do not have evidence of immunity should receive the first dose after pregnancy. This first dose should be obtained before leaving the health care facility. The second dose should be obtained 4-8 weeks after the first dose.  Human papillomavirus (HPV) vaccine. Females aged 13-26 years who have not received the vaccine previously should obtain the 3-dose series. The vaccine is not recommended for use in pregnant females. However, pregnancy testing is not needed before receiving a dose. If a female is found to be pregnant after receiving a dose, no treatment is needed. In that case, the remaining doses should be delayed until after the pregnancy. Immunization is recommended for any person with an immunocompromised condition through the age of 61 years if she did not get any or all doses earlier. During the  3-dose series, the second dose should be obtained 4-8 weeks after the first dose. The third dose should be obtained 24 weeks after the first dose and 16 weeks after the second dose.  Zoster vaccine. One dose is recommended for adults aged 30 years or older unless certain conditions are present.  Measles, mumps, and rubella (MMR) vaccine. Adults born  before 1957 generally are considered immune to measles and mumps. Adults born in 1957 or later should have 1 or more doses of MMR vaccine unless there is a contraindication to the vaccine or there is laboratory evidence of immunity to each of the three diseases. A routine second dose of MMR vaccine should be obtained at least 28 days after the first dose for students attending postsecondary schools, health care workers, or international travelers. People who received inactivated measles vaccine or an unknown type of measles vaccine during 1963-1967 should receive 2 doses of MMR vaccine. People who received inactivated mumps vaccine or an unknown type of mumps vaccine before 1979 and are at high risk for mumps infection should consider immunization with 2 doses of MMR vaccine. For females of childbearing age, rubella immunity should be determined. If there is no evidence of immunity, females who are not pregnant should be vaccinated. If there is no evidence of immunity, females who are pregnant should delay immunization until after pregnancy. Unvaccinated health care workers born before 1957 who lack laboratory evidence of measles, mumps, or rubella immunity or laboratory confirmation of disease should consider measles and mumps immunization with 2 doses of MMR vaccine or rubella immunization with 1 dose of MMR vaccine.  Pneumococcal 13-valent conjugate (PCV13) vaccine. When indicated, a person who is uncertain of his immunization history and has no record of immunization should receive the PCV13 vaccine. All adults 65 years of age and older should receive this  vaccine. An adult aged 19 years or older who has certain medical conditions and has not been previously immunized should receive 1 dose of PCV13 vaccine. This PCV13 should be followed with a dose of pneumococcal polysaccharide (PPSV23) vaccine. Adults who are at high risk for pneumococcal disease should obtain the PPSV23 vaccine at least 8 weeks after the dose of PCV13 vaccine. Adults older than 80 years of age who have normal immune system function should obtain the PPSV23 vaccine dose at least 1 year after the dose of PCV13 vaccine.  Pneumococcal polysaccharide (PPSV23) vaccine. When PCV13 is also indicated, PCV13 should be obtained first. All adults aged 65 years and older should be immunized. An adult younger than age 65 years who has certain medical conditions should be immunized. Any person who resides in a nursing home or long-term care facility should be immunized. An adult smoker should be immunized. People with an immunocompromised condition and certain other conditions should receive both PCV13 and PPSV23 vaccines. People with human immunodeficiency virus (HIV) infection should be immunized as soon as possible after diagnosis. Immunization during chemotherapy or radiation therapy should be avoided. Routine use of PPSV23 vaccine is not recommended for American Indians, [place] Natives, or people younger than 65 years unless there are medical conditions that require PPSV23 vaccine. When indicated, people who have unknown immunization and have no record of immunization should receive PPSV23 vaccine. One-time revaccination 5 years after the first dose of PPSV23 is recommended for people aged 19-64 years who have chronic kidney failure, nephrotic syndrome, asplenia, or immunocompromised conditions. People who received 1-2 doses of PPSV23 before age 65 years should receive another dose of PPSV23 vaccine at age 65 years or later if at least 5 years have passed since the previous dose. Doses of PPSV23 are not  needed for people immunized with PPSV23 at or after age 65 years.  Meningococcal vaccine. Adults with asplenia or persistent complement component deficiencies should receive 2 doses of quadrivalent meningococcal conjugate (MenACWY-D) vaccine. The doses should be obtained   at least 2 months apart. Microbiologists working with certain meningococcal bacteria, [place] recruits, people at risk during an outbreak, and people who travel to or live in countries with a high rate of meningitis should be immunized. A first-year college student up through age 34 years who is living in a residence hall should receive a dose if she did not receive a dose on or after her 16th birthday. Adults who have certain high-risk conditions should receive one or more doses of vaccine.  Hepatitis A vaccine. Adults who wish to be protected from this disease, have certain high-risk conditions, work with hepatitis A-infected animals, work in hepatitis A research labs, or travel to or work in countries with a high rate of hepatitis A should be immunized. Adults who were previously unvaccinated and who anticipate close contact with an international adoptee during the first 60 days after arrival in the [place] from a country with a high rate of hepatitis A should be immunized.  Hepatitis B vaccine. Adults who wish to be protected from this disease, have certain high-risk conditions, may be exposed to blood or other infectious body fluids, are household contacts or sex partners of hepatitis B positive people, are clients or workers in certain care facilities, or travel to or work in countries with a high rate of hepatitis B should be immunized.  Haemophilus influenzae type b (Hib) vaccine. A previously unvaccinated person with asplenia or sickle cell disease or having a scheduled splenectomy should receive 1 dose of Hib vaccine. Regardless of previous immunization, a recipient of a hematopoietic stem cell transplant should receive a  3-dose series 6-12 months after her successful transplant. Hib vaccine is not recommended for adults with HIV infection. Preventive Services / Frequency Ages 35 to 4 years  Blood pressure check.** / Every 3-5 years.  Lipid and cholesterol check.** / Every 5 years beginning at age 60.  Clinical breast exam.** / Every 3 years for women in their 71s and 10s.  BRCA-related cancer risk assessment.** / For women who have family members with a BRCA-related cancer (breast, ovarian, tubal, or peritoneal cancers).  Pap test.** / Every 2 years from ages 76 through 26. Every 3 years starting at age 61 through age 76 or 93 with a history of 3 consecutive normal Pap tests.  HPV screening.** / Every 3 years from ages 37 through ages 60 to 51 with a history of 3 consecutive normal Pap tests.  Hepatitis C blood test.** / For any individual with known risks for hepatitis C.  Skin self-exam. / Monthly.  Influenza vaccine. / Every year.  Tetanus, diphtheria, and acellular pertussis (Tdap, Td) vaccine.** / Consult your health care provider. Pregnant women should receive 1 dose of Tdap vaccine during each pregnancy. 1 dose of Td every 10 years.  Varicella vaccine.** / Consult your health care provider. Pregnant females who do not have evidence of immunity should receive the first dose after pregnancy.  HPV vaccine. / 3 doses over 6 months, if 93 and younger. The vaccine is not recommended for use in pregnant females. However, pregnancy testing is not needed before receiving a dose.  Measles, mumps, rubella (MMR) vaccine.** / You need at least 1 dose of MMR if you were born in 1957 or later. You may also need a 2nd dose. For females of childbearing age, rubella immunity should be determined. If there is no evidence of immunity, females who are not pregnant should be vaccinated. If there is no evidence of immunity, females who are  pregnant should delay immunization until after pregnancy.  Pneumococcal  13-valent conjugate (PCV13) vaccine.** / Consult your health care provider.  Pneumococcal polysaccharide (PPSV23) vaccine.** / 1 to 2 doses if you smoke cigarettes or if you have certain conditions.  Meningococcal vaccine.** / 1 dose if you are age 68 to 8 years and a Market researcher living in a residence hall, or have one of several medical conditions, you need to get vaccinated against meningococcal disease. You may also need additional booster doses.  Hepatitis A vaccine.** / Consult your health care provider.  Hepatitis B vaccine.** / Consult your health care provider.  Haemophilus influenzae type b (Hib) vaccine.** / Consult your health care provider. Ages 7 to 53 years  Blood pressure check.** / Every year.  Lipid and cholesterol check.** / Every 5 years beginning at age 25 years.  Lung cancer screening. / Every year if you are aged 11-80 years and have a 30-pack-year history of smoking and currently smoke or have quit within the past 15 years. Yearly screening is stopped once you have quit smoking for at least 15 years or develop a health problem that would prevent you from having lung cancer treatment.  Clinical breast exam.** / Every year after age 48 years.  BRCA-related cancer risk assessment.** / For women who have family members with a BRCA-related cancer (breast, ovarian, tubal, or peritoneal cancers).  Mammogram.** / Every year beginning at age 41 years and continuing for as long as you are in good health. Consult with your health care provider.  Pap test.** / Every 3 years starting at age 65 years through age 37 or 70 years with a history of 3 consecutive normal Pap tests.  HPV screening.** / Every 3 years from ages 72 years through ages 60 to 40 years with a history of 3 consecutive normal Pap tests.  Fecal occult blood test (FOBT) of stool. / Every year beginning at age 21 years and continuing until age 5 years. You may not need to do this test if you get  a colonoscopy every 10 years.  Flexible sigmoidoscopy or colonoscopy.** / Every 5 years for a flexible sigmoidoscopy or every 10 years for a colonoscopy beginning at age 35 years and continuing until age 48 years.  Hepatitis C blood test.** / For all people born from [birth year] through 1965 and any individual with known risks for hepatitis C.  Skin self-exam. / Monthly.  Influenza vaccine. / Every year.  Tetanus, diphtheria, and acellular pertussis (Tdap/Td) vaccine.** / Consult your health care provider. Pregnant women should receive 1 dose of Tdap vaccine during each pregnancy. 1 dose of Td every 10 years.  Varicella vaccine.** / Consult your health care provider. Pregnant females who do not have evidence of immunity should receive the first dose after pregnancy.  Zoster vaccine.** / 1 dose for adults aged 30 years or older.  Measles, mumps, rubella (MMR) vaccine.** / You need at least 1 dose of MMR if you were born in 1957 or later. You may also need a second dose. For females of childbearing age, rubella immunity should be determined. If there is no evidence of immunity, females who are not pregnant should be vaccinated. If there is no evidence of immunity, females who are pregnant should delay immunization until after pregnancy.  Pneumococcal 13-valent conjugate (PCV13) vaccine.** / Consult your health care provider.  Pneumococcal polysaccharide (PPSV23) vaccine.** / 1 to 2 doses if you smoke cigarettes or if you have certain conditions.  Meningococcal vaccine.** /  Consult your health care provider.  Hepatitis A vaccine.** / Consult your health care provider.  Hepatitis B vaccine.** / Consult your health care provider.  Haemophilus influenzae type b (Hib) vaccine.** / Consult your health care provider. Ages 64 years and over  Blood pressure check.** / Every year.  Lipid and cholesterol check.** / Every 5 years beginning at age 23 years.  Lung cancer screening. / Every year if you  are aged 16-80 years and have a 30-pack-year history of smoking and currently smoke or have quit within the past 15 years. Yearly screening is stopped once you have quit smoking for at least 15 years or develop a health problem that would prevent you from having lung cancer treatment.  Clinical breast exam.** / Every year after age 74 years.  BRCA-related cancer risk assessment.** / For women who have family members with a BRCA-related cancer (breast, ovarian, tubal, or peritoneal cancers).  Mammogram.** / Every year beginning at age 44 years and continuing for as long as you are in good health. Consult with your health care provider.  Pap test.** / Every 3 years starting at age 58 years through age 22 or 39 years with 3 consecutive normal Pap tests. Testing can be stopped between 65 and 70 years with 3 consecutive normal Pap tests and no abnormal Pap or HPV tests in the past 10 years.  HPV screening.** / Every 3 years from ages 64 years through ages 70 or 61 years with a history of 3 consecutive normal Pap tests. Testing can be stopped between 65 and 70 years with 3 consecutive normal Pap tests and no abnormal Pap or HPV tests in the past 10 years.  Fecal occult blood test (FOBT) of stool. / Every year beginning at age 40 years and continuing until age 27 years. You may not need to do this test if you get a colonoscopy every 10 years.  Flexible sigmoidoscopy or colonoscopy.** / Every 5 years for a flexible sigmoidoscopy or every 10 years for a colonoscopy beginning at age 7 years and continuing until age 32 years.  Hepatitis C blood test.** / For all people born from [birth year] through 1963 and any individual with known risks for hepatitis C.  Osteoporosis screening.** / A one-time screening for women ages 30 years and over and women at risk for fractures or osteoporosis.  Skin self-exam. / Monthly.  Influenza vaccine. / Every year.  Tetanus, diphtheria, and acellular pertussis (Tdap/Td)  vaccine.** / 1 dose of Td every 10 years.  Varicella vaccine.** / Consult your health care provider.  Zoster vaccine.** / 1 dose for adults aged 35 years or older.  Pneumococcal 13-valent conjugate (PCV13) vaccine.** / Consult your health care provider.  Pneumococcal polysaccharide (PPSV23) vaccine.** / 1 dose for all adults aged 46 years and older.  Meningococcal vaccine.** / Consult your health care provider.  Hepatitis A vaccine.** / Consult your health care provider.  Hepatitis B vaccine.** / Consult your health care provider.  Haemophilus influenzae type b (Hib) vaccine.** / Consult your health care provider. ** Family history and personal history of risk and conditions may change your health care provider's recommendations.   This information is not intended to replace advice given to you by your health care provider. Make sure you discuss any questions you have with your health care provider.   Document Released: 11/17/2001 Document Revised: 10/12/2014 Document Reviewed: 02/16/2011 Elsevier Interactive Patient Education [employer].

## 2016-08-14 NOTE — Progress Notes (Signed)
Pre visit review using our clinic review tool, if applicable. No additional management support is needed unless otherwise documented below in the visit note. 

## 2016-08-14 NOTE — Progress Notes (Signed)
Patient ID: Amber Morgan, female   DOB: 19-Apr-1927, 82 y.o.   MRN: [id number]   Subjective:    Patient ID: Amber Morgan, female    DOB: [birth year], 82 y.o.   MRN: [id number]  Chief Complaint  Patient presents with  . Annual Exam    HPI Patient is in today for annual preventative exam and follow up on numerous medical concerns. She is noting a persistent sense of fullness and swishing in right ear. No hearing loss. She is noting a persistent lump on right arm, nontender, not changing. No recent illness or hospitalization. Endorses some urinary frequency but no dysuria or urgency. Denies CP/palp/SOB/HA/congestion/fevers/GI or GU c/o. Taking meds as prescribed  Past Medical History:  Diagnosis Date  . Colon polyps   . Diverticulitis   . Diverticulosis 01/04/2014  . History of chicken pox    childhood  . HTN (hypertension)   . Hyperglycemia 09/05/2013  . Lipoma of arm 12/09/2014   right  . Nocturia 02/08/2013  . Osteopenia 12/09/2014  . Personal history of colonic polyps 09/05/2013  . Preventative health care 08/14/2016  . Unspecified constipation 02/06/2013  . Urinary incontinence   . Viral infection characterized by skin and mucous membrane lesions 05/11/2013    Past Surgical History:  Procedure Laterality Date  . ABDOMINAL HYSTERECTOMY    . BLADDER SURGERY  2003   bladder tact  . EYE SURGERY     multiple bilateral  . RECTOCELE REPAIR Bilateral     Family History  Problem Relation Age of Onset  . Colon cancer Mother   . Hypertension Mother   . Heart disease Father   . Meniere's disease Father   . Heart disease Sister   . Arthritis Son     back  . Glaucoma Son   . Hypertension Son   . Cataracts Son   . Colon cancer Sister   . Alzheimer's disease Sister   . Glaucoma Sister   . Glaucoma Sister   . Hyperlipidemia Sister   . Hypertension Sister   . Cancer Sister     thyroid  . Diabetes Sister   . Glaucoma Sister     Social History   Social History  . Marital status:  Married    Spouse name: N/A  . Number of children: 4  . Years of education: N/A   Occupational History  . Not on file.   Social History Main Topics  . Smoking status: Never Smoker  . Smokeless tobacco: Never Used  . Alcohol use No  . Drug use: Unknown  . Sexual activity: Not on file   Other Topics Concern  . Not on file   Social History Narrative  . No narrative on file    Outpatient Medications Prior to Visit  Medication Sig Dispense Refill  . amLODipine (NORVASC) 5 MG tablet Take 1 tablet (5 mg total) by mouth daily. 90 tablet 3  . Calcium Citrate-Vitamin D (CALCIUM CITRATE + D PO) Take 1 tablet by mouth daily.    Marland Kitchen KLOR-CON M20 20 MEQ tablet TAKE ONE TABLET BY MOUTH ONCE DAILY 30 tablet 4  . Multiple Vitamins-Minerals (ONE-A-DAY WOMENS 50 PLUS PO) Take 1 each by mouth daily.    . Probiotic Product (PROBIOTIC DAILY PO) Take by mouth.    . psyllium (METAMUCIL) 58.6 % packet Take 1 packet by mouth daily.    Marland Kitchen triamterene-hydrochlorothiazide (MAXZIDE-25) 37.5-25 MG tablet TAKE ONE TABLET BY MOUTH ONCE DAILY 30 tablet 6  . timolol (BETIMOL) 0.5 %  ophthalmic solution Place 1 drop into the left eye every morning.      No facility-administered medications prior to visit.     No Known Allergies  Review of Systems  Constitutional: Negative for chills, fever and malaise/fatigue.  HENT: Negative for congestion and hearing loss.   Eyes: Negative for discharge.  Respiratory: Negative for cough, sputum production and shortness of breath.   Cardiovascular: Positive for leg swelling. Negative for chest pain and palpitations.  Gastrointestinal: Negative for abdominal pain, blood in stool, constipation, diarrhea, heartburn, nausea and vomiting.  Genitourinary: Negative for dysuria, frequency, hematuria and urgency.  Musculoskeletal: Negative for back pain, falls and myalgias.  Skin: Negative for rash.  Neurological: Negative for dizziness, sensory change, loss of consciousness,  weakness and headaches.  Endo/Heme/Allergies: Negative for environmental allergies. Does not bruise/bleed easily.  Psychiatric/Behavioral: Negative for depression and suicidal ideas. The patient is not nervous/anxious and does not have insomnia.        Objective:    Physical Exam  Constitutional: She is oriented to person, place, and time. She appears well-developed and well-nourished. No distress.  HENT:  Head: Normocephalic and atraumatic.  Eyes: Conjunctivae are normal.  Neck: Neck supple. No thyromegaly present.  Cardiovascular: Normal rate, regular rhythm and normal heart sounds.   No murmur heard. Pulmonary/Chest: Effort normal and breath sounds normal. No respiratory distress.  Abdominal: Soft. Bowel sounds are normal. She exhibits no distension and no mass. There is no tenderness.  Musculoskeletal: She exhibits edema.  Lymphadenopathy:    She has no cervical adenopathy.  Neurological: She is alert and oriented to person, place, and time.  Skin: Skin is warm and dry.  Psychiatric: She has a normal mood and affect. Her behavior is normal.    BP (!) 140/54 (BP Location: Left Arm, Patient Position: Sitting, Cuff Size: Normal)   Pulse 70   Temp 97.9 F (36.6 C)   Ht 4\' 11"  (1.499 m)   Wt 116 lb 1.6 oz (52.7 kg)   SpO2 100%   BMI 23.45 kg/m  Wt Readings from Last 3 Encounters:  08/14/16 116 lb 1.6 oz (52.7 kg)  02/28/16 116 lb 6 oz (52.8 kg)  02/12/16 116 lb 1.9 oz (52.7 kg)     Lab Results  Component Value Date   WBC 4.8 02/20/2016   HGB 12.9 02/20/2016   HCT 38.0 02/20/2016   PLT 231.0 02/20/2016   GLUCOSE 97 08/06/2016   CHOL 141 08/06/2016   TRIG 58.0 08/06/2016   HDL 65.00 08/06/2016   LDLCALC 65 08/06/2016   ALT 14 08/06/2016   Morgan 19 08/06/2016   NA 136 08/06/2016   K 4.1 08/06/2016   CL 101 08/06/2016   CREATININE 0.81 08/06/2016   BUN 17 08/06/2016   CO2 30 08/06/2016   TSH 1.33 08/06/2016   HGBA1C 5.9 08/06/2016   MICROALBUR 0.8 08/06/2016     Lab Results  Component Value Date   TSH 1.33 08/06/2016   Lab Results  Component Value Date   WBC 4.8 02/20/2016   HGB 12.9 02/20/2016   HCT 38.0 02/20/2016   MCV 93.8 02/20/2016   PLT 231.0 02/20/2016   Lab Results  Component Value Date   NA 136 08/06/2016   K 4.1 08/06/2016   CO2 30 08/06/2016   GLUCOSE 97 08/06/2016   BUN 17 08/06/2016   CREATININE 0.81 08/06/2016   BILITOT 0.6 08/06/2016   ALKPHOS 59 08/06/2016   Morgan 19 08/06/2016   ALT 14 08/06/2016   PROT  6.4 08/06/2016   ALBUMIN 3.9 08/06/2016   CALCIUM 9.5 08/06/2016   GFR 71.07 08/06/2016   Lab Results  Component Value Date   CHOL 141 08/06/2016   Lab Results  Component Value Date   HDL 65.00 08/06/2016   Lab Results  Component Value Date   LDLCALC 65 08/06/2016   Lab Results  Component Value Date   TRIG 58.0 08/06/2016   Lab Results  Component Value Date   CHOLHDL 2 08/06/2016   Lab Results  Component Value Date   HGBA1C 5.9 08/06/2016       Assessment & Plan:   Problem List Items Addressed This Visit    HTN (hypertension)    Well controlled, no changes to meds. Encouraged heart healthy diet such as the DASH diet and exercise as tolerated.       Hyperlipidemia    Encouraged heart healthy diet, increase exercise, avoid trans fats, consider a krill oil cap daily      Osteopenia    Encouraged to get adequate exercise, calcium and vitamin d intake      Preventative health care    Patient encouraged to maintain heart healthy diet, regular exercise, adequate sleep. Consider daily probiotics. Take medications as prescribed         I have discontinued Ms. Waag timolol. I am also having her maintain her Multiple Vitamins-Minerals (ONE-A-DAY WOMENS 50 PLUS PO), Probiotic Product (PROBIOTIC DAILY PO), Calcium Citrate-Vitamin D (CALCIUM CITRATE + D PO), amLODipine, psyllium, triamterene-hydrochlorothiazide, and KLOR-CON M20.  No orders of the defined types were placed in this  encounter.    Penni Homans, MD

## 2016-08-14 NOTE — Assessment & Plan Note (Signed)
Patient encouraged to maintain heart healthy diet, regular exercise, adequate sleep. Consider daily probiotics. Take medications as prescribed 

## 2016-08-14 NOTE — Assessment & Plan Note (Signed)
Encouraged to get adequate exercise, calcium and vitamin d intake 

## 2016-08-14 NOTE — Assessment & Plan Note (Signed)
Encouraged heart healthy diet, increase exercise, avoid trans fats, consider a krill oil cap daily 

## 2016-09-21 ENCOUNTER — Other Ambulatory Visit: Payer: Self-pay | Admitting: Family Medicine

## 2016-10-04 ENCOUNTER — Other Ambulatory Visit: Payer: Self-pay | Admitting: Family Medicine

## 2016-10-14 ENCOUNTER — Telehealth: Payer: Self-pay | Admitting: Family Medicine

## 2016-10-14 MED ORDER — TRIAMTERENE-HCTZ 37.5-25 MG PO TABS: 1.0000 | ORAL_TABLET | Freq: Every day | ORAL | 2 refills | Status: DC

## 2016-10-14 NOTE — Telephone Encounter (Signed)
Relation to [id number] Call back [place]: [facility] Order  Reason for call:  Patient requesting triamterene-hydrochlorothiazide ([id number]) 37.5-25 MG tablet 6 refills please send to [facility]

## 2016-10-14 NOTE — Telephone Encounter (Signed)
Rx was previously sent to local pharmacy-remaining refills canceled at local pharmacy (spoke w/ [place]) and med filled to mail order as requested.

## 2016-10-15 DIAGNOSIS — H401113 Primary open-angle glaucoma, right eye, severe stage: Secondary | ICD-10-CM | POA: Diagnosis not present

## 2016-10-15 DIAGNOSIS — H18422 Band keratopathy, left eye: Secondary | ICD-10-CM | POA: Diagnosis not present

## 2016-10-15 DIAGNOSIS — H44522 Atrophy of globe, left eye: Secondary | ICD-10-CM | POA: Diagnosis not present

## 2016-10-15 DIAGNOSIS — H524 Presbyopia: Secondary | ICD-10-CM | POA: Diagnosis not present

## 2016-10-15 DIAGNOSIS — Z961 Presence of intraocular lens: Secondary | ICD-10-CM | POA: Diagnosis not present

## 2016-10-15 DIAGNOSIS — H43811 Vitreous degeneration, right eye: Secondary | ICD-10-CM | POA: Diagnosis not present

## 2016-10-16 ENCOUNTER — Other Ambulatory Visit: Payer: Self-pay

## 2016-10-20 ENCOUNTER — Other Ambulatory Visit: Payer: Self-pay | Admitting: Family Medicine

## 2016-10-20 MED ORDER — POTASSIUM CHLORIDE CRYS ER 20 MEQ PO TBCR
20.0000 meq | EXTENDED_RELEASE_TABLET | Freq: Every day | ORAL | 1 refills | Status: DC
Start: 2016-10-20 — End: 2017-03-03

## 2016-12-15 ENCOUNTER — Ambulatory Visit: Payer: Medicare HMO | Admitting: Family Medicine

## 2016-12-15 ENCOUNTER — Ambulatory Visit: Payer: Medicare Other | Admitting: Family Medicine

## 2017-01-05 ENCOUNTER — Telehealth: Payer: Self-pay | Admitting: Family Medicine

## 2017-01-05 NOTE — Telephone Encounter (Signed)
Spoke with Amber Morgan regarding her appt on 02/01/2017. Patient asked if she needs to have any labs drawn and if she can complete the labs before her visit. Patient's best contact number is [phone].

## 2017-01-08 ENCOUNTER — Ambulatory Visit (INDEPENDENT_AMBULATORY_CARE_PROVIDER_SITE_OTHER): Payer: Medicare HMO | Admitting: Family Medicine

## 2017-01-08 ENCOUNTER — Encounter: Payer: Self-pay | Admitting: Family Medicine

## 2017-01-08 VITALS — BP 154/60 | HR 75 | Temp 98.2°F | Ht 59.0 in | Wt 115.4 lb

## 2017-01-08 DIAGNOSIS — B372 Candidiasis of skin and nail: Secondary | ICD-10-CM | POA: Diagnosis not present

## 2017-01-08 DIAGNOSIS — J301 Allergic rhinitis due to pollen: Secondary | ICD-10-CM | POA: Diagnosis not present

## 2017-01-08 DIAGNOSIS — J029 Acute pharyngitis, unspecified: Secondary | ICD-10-CM

## 2017-01-08 DIAGNOSIS — I781 Nevus, non-neoplastic: Secondary | ICD-10-CM

## 2017-01-08 DIAGNOSIS — D1801 Hemangioma of skin and subcutaneous tissue: Secondary | ICD-10-CM

## 2017-01-08 LAB — POCT RAPID STREP A (OFFICE): Rapid Strep A Screen: NEGATIVE

## 2017-01-08 MED ORDER — KETOCONAZOLE 2 % EX CREA: 1.0000 "application " | TOPICAL_CREAM | Freq: Every day | CUTANEOUS | 0 refills | Status: DC

## 2017-01-08 MED ORDER — FEXOFENADINE HCL 180 MG PO TABS: 180.0000 mg | ORAL_TABLET | Freq: Every day | ORAL | 0 refills | Status: DC

## 2017-01-08 NOTE — Progress Notes (Signed)
Pre visit review using our clinic review tool, if applicable. No additional management support is needed unless otherwise documented below in the visit note. 

## 2017-01-08 NOTE — Patient Instructions (Addendum)
Claritin (loratadine), Allegra (fexofenadine), Zyrtec (cetirizine); these are listed in order from weakest to strongest. Generic, and therefore cheaper, options are in the parentheses.   There are available OTC, and the generic versions, which may be cheaper, are in parentheses. Show this to a pharmacist if you have trouble finding any of these items.  Use the cream daily over your belly button.

## 2017-01-08 NOTE — Progress Notes (Signed)
Chief Complaint  Patient presents with  . Sore Throat    mainly at night-today she has felt it all day,also today she had a coughing spell    [name] here for URI complaints.  Duration: 1 week  Associated symptoms: sore throat and one episode of coughing; worse today after going outside Denies: sinus congestion, sinus pain, rhinorrhea, ear pain, ear drainage, shortness of breath, myalgia and fevers/rigors Treatment to date: None Sick contacts: No  ROS:  Const: Denies fevers HEENT: As noted in HPI Lungs: No SOB  Past Medical History:  Diagnosis Date  . Colon polyps   . Diverticulitis   . Diverticulosis 01/04/2014  . History of chicken pox    childhood  . HTN (hypertension)   . Hyperglycemia 09/05/2013  . Lipoma of arm 12/09/2014   right  . Nocturia 02/08/2013  . Osteopenia 12/09/2014  . Personal history of colonic polyps 09/05/2013  . Preventative health care 08/14/2016  . Unspecified constipation 02/06/2013  . Urinary incontinence   . Viral infection characterized by skin and mucous membrane lesions 05/11/2013   Family History  Problem Relation Age of Onset  . Colon cancer Mother   . Hypertension Mother   . Heart disease Father   . Meniere's disease Father   . Heart disease Sister   . Arthritis Son     back  . Glaucoma Son   . Hypertension Son   . Cataracts Son   . Colon cancer Sister   . Alzheimer's disease Sister   . Glaucoma Sister   . Glaucoma Sister   . Hyperlipidemia Sister   . Hypertension Sister   . Cancer Sister     thyroid  . Diabetes Sister   . Glaucoma Sister     BP (!) 154/60 (BP Location: Left Arm, Patient Position: Sitting, Cuff Size: Normal)   Pulse 75   Temp 98.2 F (36.8 C) (Oral)   Ht 4\' 11"  (1.499 m)   Wt 115 lb 6.4 oz (52.3 kg)   SpO2 98%   BMI 23.31 kg/m  General: Awake, alert, appears stated age HEENT: AT, [place], ears patent b/l and TM's neg, nares patent w/o discharge, pharynx pink and without exudates, over the soft palate, there  is a patch of erythema, MMM Neck: No masses or asymmetry Heart: RRR, no murmurs, no bruits Lungs: CTAB, no accessory muscle use Skin: Umbilicus is mildly erythematous with some white discharge present; also pinpoint tiny red macules over anterior trunk Psych: Age appropriate judgment and insight, normal mood and affect  Acute seasonal allergic rhinitis due to pollen - Plan: fexofenadine (ALLEGRA) 180 MG tablet, POCT rapid strep A, Culture, Group A Strep  Yeast dermatitis - Plan: ketoconazole (NIZORAL) 2 % cream  Cherry angioma  Sore throat - Plan: POCT rapid strep A, Culture, Group A Strep  Orders as above. Strep neg. Cultured area on soft palate, may just be mechanical trauma.  Area of umbilicus appears to be related to fungal cause.  Cherry angiomas not a concern. Voiced this to patient and daughter-in-law. Rapid strep neg.  F/u prn. If starting to experience fevers, shaking, or shortness of breath, seek immediate care. Pt voiced understanding and agreement to the plan.  Time, DO 01/08/17 4:23 PM

## 2017-01-10 LAB — CULTURE, GROUP A STREP

## 2017-01-10 NOTE — Telephone Encounter (Signed)
Can have a cmp, cbc, hgba1c for HTN and hyperglycemia prior to visit.

## 2017-01-11 ENCOUNTER — Other Ambulatory Visit: Payer: Self-pay | Admitting: Family Medicine

## 2017-01-11 DIAGNOSIS — R739 Hyperglycemia, unspecified: Secondary | ICD-10-CM

## 2017-01-11 DIAGNOSIS — I10 Essential (primary) hypertension: Secondary | ICD-10-CM

## 2017-01-11 NOTE — Telephone Encounter (Signed)
Patient called and scheduled her a lab appt and put in order.

## 2017-01-14 DIAGNOSIS — Z961 Presence of intraocular lens: Secondary | ICD-10-CM | POA: Diagnosis not present

## 2017-01-14 DIAGNOSIS — H18422 Band keratopathy, left eye: Secondary | ICD-10-CM | POA: Diagnosis not present

## 2017-01-14 DIAGNOSIS — H524 Presbyopia: Secondary | ICD-10-CM | POA: Diagnosis not present

## 2017-01-14 DIAGNOSIS — H401113 Primary open-angle glaucoma, right eye, severe stage: Secondary | ICD-10-CM | POA: Diagnosis not present

## 2017-01-14 DIAGNOSIS — H44522 Atrophy of globe, left eye: Secondary | ICD-10-CM | POA: Diagnosis not present

## 2017-01-14 DIAGNOSIS — H10413 Chronic giant papillary conjunctivitis, bilateral: Secondary | ICD-10-CM | POA: Diagnosis not present

## 2017-01-14 DIAGNOSIS — H43811 Vitreous degeneration, right eye: Secondary | ICD-10-CM | POA: Diagnosis not present

## 2017-01-25 ENCOUNTER — Other Ambulatory Visit (INDEPENDENT_AMBULATORY_CARE_PROVIDER_SITE_OTHER): Payer: Medicare HMO

## 2017-01-25 DIAGNOSIS — I10 Essential (primary) hypertension: Secondary | ICD-10-CM | POA: Diagnosis not present

## 2017-01-25 DIAGNOSIS — R739 Hyperglycemia, unspecified: Secondary | ICD-10-CM | POA: Diagnosis not present

## 2017-01-25 LAB — COMPREHENSIVE METABOLIC PANEL
ALT: 21 U/L (ref 0–35)
AST: 30 U/L (ref 0–37)
Albumin: 4.1 g/dL (ref 3.5–5.2)
Alkaline Phosphatase: 57 U/L (ref 39–117)
BUN: 23 mg/dL (ref 6–23)
CALCIUM: 9.8 mg/dL (ref 8.4–10.5)
CO2: 28 meq/L (ref 19–32)
CREATININE: 0.85 mg/dL (ref 0.40–1.20)
Chloride: 97 mEq/L (ref 96–112)
GFR: 67.15 mL/min (ref >60.00)
GLUCOSE: 84 mg/dL (ref 70–99)
Potassium: 4.3 mEq/L (ref 3.5–5.1)
SODIUM: 132 meq/L — AB (ref 135–145)
Total Bilirubin: 0.6 mg/dL (ref 0.2–1.2)
Total Protein: 6.8 g/dL (ref 6.0–8.3)

## 2017-01-25 LAB — CBC
HEMATOCRIT: 38 % (ref 36.0–46.0)
Hemoglobin: 12.7 g/dL (ref 12.0–15.0)
MCHC: 33.4 g/dL (ref 30.0–36.0)
MCV: 95.4 fl (ref 78.0–100.0)
Platelets: 251 10*3/uL (ref 150.0–400.0)
RBC: 3.99 Mil/uL (ref 3.87–5.11)
RDW: 13.7 % (ref 11.5–15.5)
WBC: 8.6 10*3/uL (ref 4.0–10.5)

## 2017-01-25 LAB — HEMOGLOBIN A1C: HEMOGLOBIN A1C: 6.3 % (ref 4.6–6.5)

## 2017-02-01 ENCOUNTER — Encounter: Payer: Self-pay | Admitting: Family Medicine

## 2017-02-01 ENCOUNTER — Other Ambulatory Visit (HOSPITAL_COMMUNITY)
Admission: RE | Admit: 2017-02-01 | Discharge: 2017-02-01 | Disposition: A | Discharge status: Home / Self Care | Payer: Medicare HMO | Source: Ambulatory Visit | Attending: Family Medicine | Admitting: Family Medicine

## 2017-02-01 ENCOUNTER — Ambulatory Visit (INDEPENDENT_AMBULATORY_CARE_PROVIDER_SITE_OTHER): Payer: Medicare HMO | Admitting: Family Medicine

## 2017-02-01 ENCOUNTER — Telehealth: Payer: Self-pay | Admitting: Family Medicine

## 2017-02-01 VITALS — BP 133/52 | HR 102 | Temp 99.9°F | Resp 18 | Wt 116.8 lb

## 2017-02-01 DIAGNOSIS — N898 Other specified noninflammatory disorders of vagina: Secondary | ICD-10-CM

## 2017-02-01 DIAGNOSIS — R739 Hyperglycemia, unspecified: Secondary | ICD-10-CM

## 2017-02-01 DIAGNOSIS — R000 Tachycardia, unspecified: Secondary | ICD-10-CM | POA: Diagnosis not present

## 2017-02-01 DIAGNOSIS — R197 Diarrhea, unspecified: Secondary | ICD-10-CM | POA: Diagnosis not present

## 2017-02-01 DIAGNOSIS — N390 Urinary tract infection, site not specified: Secondary | ICD-10-CM

## 2017-02-01 DIAGNOSIS — I10 Essential (primary) hypertension: Secondary | ICD-10-CM | POA: Diagnosis not present

## 2017-02-01 DIAGNOSIS — R350 Frequency of micturition: Secondary | ICD-10-CM

## 2017-02-01 DIAGNOSIS — M8580 Other specified disorders of bone density and structure, unspecified site: Secondary | ICD-10-CM

## 2017-02-01 NOTE — Assessment & Plan Note (Signed)
Encouraged to get adequate exercise, calcium and vitamin d intake 

## 2017-02-01 NOTE — Patient Instructions (Addendum)
Rest your stomach 12-24 hours of fluids  Then after 24 hours use the BRAT diet  Bananas  Rice Applesauce Toast     Bland Diet A bland diet consists of foods that do not have a lot of fat or fiber. Foods without fat or fiber are easier for the body to digest. They are also less likely to irritate your mouth, throat, stomach, and other parts of your gastrointestinal tract. A bland diet is sometimes called a BRAT diet. What is my plan? Your health care provider or dietitian may recommend specific changes to your diet to prevent and treat your symptoms, such as:  Eating small meals often.  Cooking food until it is soft enough to chew easily.  Chewing your food well.  Drinking fluids slowly.  Not eating foods that are very spicy, sour, or fatty.  Not eating citrus fruits, such as oranges and grapefruit. What do I need to know about this diet?  Eat a variety of foods from the bland diet food list.  Do not follow a bland diet longer than you have to.  Ask your health care provider whether you should take vitamins. What foods can I eat? Grains   Hot cereals, such as cream of wheat. Bread, crackers, or tortillas made from refined white flour. Rice. Vegetables  Canned or cooked vegetables. Mashed or boiled potatoes. Fruits  Bananas. Applesauce. Other types of cooked or canned fruit with the skin and seeds removed, such as canned peaches or pears. Meats and Other Protein Sources  Scrambled eggs. Creamy peanut butter or other nut butters. Lean, well-cooked meats, such as chicken or fish. Tofu. Soups or broths. Dairy  Low-fat dairy products, such as milk, cottage cheese, or yogurt. Beverages  Water. Herbal tea. Apple juice. Sweets and Desserts  Pudding. Custard. Fruit gelatin. Ice cream. Fats and Oils  Mild salad dressings. Canola or olive oil. The items listed above may not be a complete list of allowed foods or beverages. Contact your dietitian for more options.  What foods  are not recommended? Foods and ingredients that are often not recommended include:  Spicy foods, such as hot sauce or salsa.  Fried foods.  Sour foods, such as pickled or fermented foods.  Raw vegetables or fruits, especially citrus or berries.  Caffeinated drinks.  Alcohol.  Strongly flavored seasonings or condiments. The items listed above may not be a complete list of foods and beverages that are not allowed. Contact your dietitian for more information.  This information is not intended to replace advice given to you by your health care provider. Make sure you discuss any questions you have with your health care provider. Document Released: 01/13/2016 Document Revised: 02/27/2016 Document Reviewed: 10/03/2014 Elsevier Interactive Patient Education  2017 [employer].

## 2017-02-01 NOTE — Progress Notes (Signed)
Subjective:  I acted as a Education administrator for Dr. Charlett Blake. [name], [place]         Patient ID: Amber Morgan, female    DOB: 1927-09-21, 83 y.o.   MRN: [id number]  Chief Complaint  Patient presents with  . Follow-up    HPI  Patient is in today for a 4 month follow up patient c/o having cold chills and diarrhea for the past 3 days. She states with these symptoms she is extremely fatigue, and weak. She states she is getting better. She also c/o frequent urination. She had 2-3 loose stool the first day but today has not had anymore. She notes some chills persist. No obvious fevers. No household contacts with similar symptoms. No bloody or tarry stool. She is noting urinary frequency, urgency and foul smelling urine. Denies CP/palp/SOB/HA/congestion/fevers. Taking meds as prescribed Patient Care Team: Mosie Lukes, MD as PCP - General (Family Medicine) Shawnie Dapper, DO as Consulting Physician (Osteopathic Medicine) Rolan Lipa, MD as Consulting Physician (Gastroenterology) Lelon Perla, MD as Consulting Physician (Cardiology)   Past Medical History:  Diagnosis Date  . Colon polyps   . Diverticulitis   . Diverticulosis 01/04/2014  . History of chicken pox    childhood  . HTN (hypertension)   . Hyperglycemia 09/05/2013  . Lipoma of arm 12/09/2014   right  . Nocturia 02/08/2013  . Osteopenia 12/09/2014  . Personal history of colonic polyps 09/05/2013  . Preventative health care 08/14/2016  . Unspecified constipation 02/06/2013  . Urinary incontinence   . Viral infection characterized by skin and mucous membrane lesions 05/11/2013    Past Surgical History:  Procedure Laterality Date  . ABDOMINAL HYSTERECTOMY    . BLADDER SURGERY  2003   bladder tact  . EYE SURGERY     multiple bilateral  . RECTOCELE REPAIR Bilateral     Family History  Problem Relation Age of Onset  . Colon cancer Mother   . Hypertension Mother   . Heart disease Father   . Meniere's disease Father   . Heart  disease Sister   . Arthritis Son     back  . Glaucoma Son   . Hypertension Son   . Cataracts Son   . Colon cancer Sister   . Alzheimer's disease Sister   . Glaucoma Sister   . Glaucoma Sister   . Hyperlipidemia Sister   . Hypertension Sister   . Cancer Sister     thyroid  . Diabetes Sister   . Glaucoma Sister     Social History   Social History  . Marital status: Married    Spouse name: N/A  . Number of children: 4  . Years of education: N/A   Occupational History  . Not on file.   Social History Main Topics  . Smoking status: Never Smoker  . Smokeless tobacco: Never Used  . Alcohol use No  . Drug use: Unknown  . Sexual activity: Not on file   Other Topics Concern  . Not on file   Social History Narrative  . No narrative on file    Outpatient Medications Prior to Visit  Medication Sig Dispense Refill  . amLODipine (NORVASC) 5 MG tablet Take 1 tablet (5 mg total) by mouth daily. 90 tablet 3  . Calcium Citrate-Vitamin D (CALCIUM CITRATE + D PO) Take 1 tablet by mouth daily.    . fexofenadine (ALLEGRA) 180 MG tablet Take 1 tablet (180 mg total) by mouth daily. 30 tablet 0  .  ketoconazole (NIZORAL) 2 % cream Apply 1 application topically daily. Apply to belly button. 15 g 0  . Multiple Vitamins-Minerals (ONE-A-DAY WOMENS 50 PLUS PO) Take 1 each by mouth daily.    . potassium chloride SA (KLOR-CON M20) 20 MEQ tablet Take 1 tablet (20 mEq total) by mouth daily. 90 tablet 1  . Probiotic Product (PROBIOTIC DAILY PO) Take by mouth.    . psyllium (METAMUCIL) 58.6 % packet Take 1 packet by mouth daily.    Marland Kitchen triamterene-hydrochlorothiazide (MAXZIDE-25) 37.5-25 MG tablet Take 1 tablet by mouth daily. 90 tablet 2   No facility-administered medications prior to visit.     No Known Allergies  Review of Systems  Constitutional: Positive for chills, fever and malaise/fatigue.  HENT: Negative for congestion.   Eyes: Negative for blurred vision.  Respiratory: Negative for  cough and shortness of breath.   Cardiovascular: Negative for chest pain, palpitations and leg swelling.  Gastrointestinal: Positive for diarrhea. Negative for abdominal pain, blood in stool, constipation, melena, nausea and vomiting.  Genitourinary: Positive for frequency and urgency.  Musculoskeletal: Positive for myalgias. Negative for back pain.  Skin: Negative for rash.  Neurological: Positive for headaches. Negative for loss of consciousness.       Objective:    Physical Exam  Constitutional: She is oriented to person, place, and time. She appears well-developed and well-nourished. No distress.  HENT:  Head: Normocephalic and atraumatic.  Eyes: Conjunctivae are normal.  Neck: Normal range of motion. No thyromegaly present.  Cardiovascular: Normal rate and regular rhythm.   Pulmonary/Chest: Effort normal and breath sounds normal. She has no wheezes.  Abdominal: Soft. Bowel sounds are normal. There is no tenderness.  Musculoskeletal: Normal range of motion. She exhibits no edema or deformity.  Neurological: She is alert and oriented to person, place, and time.  Skin: Skin is warm and dry. She is not diaphoretic.  Psychiatric: She has a normal mood and affect.    BP (!) 133/52 (BP Location: Left Arm, Patient Position: Sitting, Cuff Size: Normal)   Pulse (!) 102   Temp 99.9 F (37.7 C) (Oral)   Resp 18   Wt 116 lb 12.8 oz (53 kg)   SpO2 100%   BMI 23.59 kg/m  Wt Readings from Last 3 Encounters:  02/01/17 116 lb 12.8 oz (53 kg)  01/08/17 115 lb 6.4 oz (52.3 kg)  08/14/16 116 lb 1.6 oz (52.7 kg)   BP Readings from Last 3 Encounters:  02/01/17 (!) 133/52  01/08/17 (!) 154/60  08/14/16 (!) 140/54     Immunization History  Administered Date(s) Administered  . Influenza Split 07/13/2012  . Influenza, High Dose Seasonal PF 06/22/2016  . Influenza,inj,Quad PF,36+ Mos 06/22/2013, 06/01/2014, 07/08/2015  . Pneumococcal Conjugate-13 06/01/2014  . Pneumococcal  Polysaccharide-23 12/07/2009  . Tdap 05/21/2013  . Zoster 07/14/2011    Health Maintenance  Topic Date Due  . MAMMOGRAM  02/11/2016  . INFLUENZA VACCINE  05/05/2017  . TETANUS/TDAP  05/22/2023  . DEXA SCAN  Completed  . PNA vac Low Risk Adult  Completed    Lab Results  Component Value Date   WBC 8.6 01/25/2017   HGB 12.7 01/25/2017   HCT 38.0 01/25/2017   PLT 251.0 01/25/2017   GLUCOSE 84 01/25/2017   CHOL 141 08/06/2016   TRIG 58.0 08/06/2016   HDL 65.00 08/06/2016   LDLCALC 65 08/06/2016   ALT 21 01/25/2017   AST 30 01/25/2017   NA 132 (L) 01/25/2017   K 4.3 Hemolyzed 01/25/2017  CL 97 01/25/2017   CREATININE 0.85 01/25/2017   BUN 23 01/25/2017   CO2 28 01/25/2017   TSH 1.33 08/06/2016   HGBA1C 6.3 01/25/2017   MICROALBUR 0.8 08/06/2016    Lab Results  Component Value Date   TSH 1.33 08/06/2016   Lab Results  Component Value Date   WBC 8.6 01/25/2017   HGB 12.7 01/25/2017   HCT 38.0 01/25/2017   MCV 95.4 01/25/2017   PLT 251.0 01/25/2017   Lab Results  Component Value Date   NA 132 (L) 01/25/2017   K 4.3 Hemolyzed 01/25/2017   CO2 28 01/25/2017   GLUCOSE 84 01/25/2017   BUN 23 01/25/2017   CREATININE 0.85 01/25/2017   BILITOT 0.6 01/25/2017   ALKPHOS 57 01/25/2017   AST 30 01/25/2017   ALT 21 01/25/2017   PROT 6.8 01/25/2017   ALBUMIN 4.1 01/25/2017   CALCIUM 9.8 01/25/2017   GFR 67.15 01/25/2017   Lab Results  Component Value Date   CHOL 141 08/06/2016   Lab Results  Component Value Date   HDL 65.00 08/06/2016   Lab Results  Component Value Date   LDLCALC 65 08/06/2016   Lab Results  Component Value Date   TRIG 58.0 08/06/2016   Lab Results  Component Value Date   CHOLHDL 2 08/06/2016   Lab Results  Component Value Date   HGBA1C 6.3 01/25/2017         Assessment & Plan:   Problem List Items Addressed This Visit    HTN (hypertension)    Well controlled, no changes to meds. Encouraged heart healthy diet such as the  DASH diet and exercise as tolerated.       Hyperglycemia    minimize simple carbs. Increase exercise as tolerated.       Osteopenia    Encouraged to get adequate exercise, calcium and vitamin d intake      Urinary tract infection    Symptomatic and urine mildly suspicious. Will proceed with Amoxicillin and await culture results       Diarrhea - Primary    Improving in past 24 hours but still notes some chills. Likely gastroenteritis. Encouraged clear fluids only for next 12 to 24 hours then proceed to [employer] as tolerated. Report worsening symptoms for further evaluation.       Relevant Orders   CBC with Differential/Platelet   Comprehensive metabolic panel   Urinalysis   Urine culture   Urination frequency   Relevant Orders   Urinalysis   Urine culture   Urine cytology ancillary only   POCT Urinalysis Dipstick (Automated) (Completed)   Tachycardia    Improved with recheck, reminded to try and improve hydration with acute illness      Relevant Orders   Ambulatory referral to Cardiology    Other Visit Diagnoses    Vaginal odor       Relevant Orders   Urine cytology ancillary only   POCT Urinalysis Dipstick (Automated) (Completed)      I am having Ms. Divelbiss maintain her Multiple Vitamins-Minerals (ONE-A-DAY WOMENS 50 PLUS PO), Probiotic Product (PROBIOTIC DAILY PO), Calcium Citrate-Vitamin D (CALCIUM CITRATE + D PO), amLODipine, psyllium, triamterene-hydrochlorothiazide, potassium chloride SA, fexofenadine, ketoconazole, and Polyethylene Glycol 3350.  Meds ordered this encounter  Medications  . Polyethylene Glycol 3350 GRAN    CMA served as scribe during this visit. History, Physical and Plan performed by medical provider. Documentation and orders reviewed and attested to.  Penni Homans, MD

## 2017-02-01 NOTE — Telephone Encounter (Signed)
Pt was seen today and says that she was advised that provider was calling in a Rx for her for a UTI. Pt is at pharmacy and is not showing a Rx has been sent. Please advise pt further.

## 2017-02-01 NOTE — Progress Notes (Signed)
Pre visit review using our clinic review tool, if applicable. No additional management support is needed unless otherwise documented below in the visit note. 

## 2017-02-01 NOTE — Assessment & Plan Note (Signed)
Well controlled, no changes to meds. Encouraged heart healthy diet such as the DASH diet and exercise as tolerated.  °

## 2017-02-02 DIAGNOSIS — R197 Diarrhea, unspecified: Secondary | ICD-10-CM | POA: Insufficient documentation

## 2017-02-02 DIAGNOSIS — R350 Frequency of micturition: Secondary | ICD-10-CM | POA: Insufficient documentation

## 2017-02-02 DIAGNOSIS — R000 Tachycardia, unspecified: Secondary | ICD-10-CM | POA: Insufficient documentation

## 2017-02-02 LAB — POC URINALSYSI DIPSTICK (AUTOMATED)
BILIRUBIN UA: NEGATIVE
Blood, UA: POSITIVE
Glucose, UA: NEGATIVE
KETONES UA: NEGATIVE
NITRITE UA: NEGATIVE
PH UA: 6 (ref 5.0–8.0)
PROTEIN UA: 1
Spec Grav, UA: 1.025 (ref 1.010–1.025)
Urobilinogen, UA: 4 E.U./dL — AB

## 2017-02-02 LAB — COMPREHENSIVE METABOLIC PANEL
ALBUMIN: 3.5 g/dL (ref 3.5–5.2)
ALK PHOS: 66 U/L (ref 39–117)
ALT: 26 U/L (ref 0–35)
AST: 22 U/L (ref 0–37)
BUN: 30 mg/dL — ABNORMAL HIGH (ref 6–23)
CALCIUM: 9.4 mg/dL (ref 8.4–10.5)
CHLORIDE: 99 meq/L (ref 96–112)
CO2: 28 mEq/L (ref 19–32)
Creatinine, Ser: 1.33 mg/dL — ABNORMAL HIGH (ref 0.40–1.20)
GFR: 40.06 mL/min — AB (ref >60.00)
Glucose, Bld: 138 mg/dL — ABNORMAL HIGH (ref 70–99)
POTASSIUM: 4.7 meq/L (ref 3.5–5.1)
Sodium: 133 mEq/L — ABNORMAL LOW (ref 135–145)
TOTAL PROTEIN: 6.6 g/dL (ref 6.0–8.3)
Total Bilirubin: 0.6 mg/dL (ref 0.2–1.2)

## 2017-02-02 LAB — URINALYSIS, ROUTINE W REFLEX MICROSCOPIC
Bilirubin Urine: NEGATIVE
KETONES UR: NEGATIVE
NITRITE: POSITIVE — AB
SPECIFIC GRAVITY, URINE: 1.01 (ref 1.000–1.030)
TOTAL PROTEIN, URINE-UPE24: 30 — AB
URINE GLUCOSE: NEGATIVE
UROBILINOGEN UA: 0.2 (ref 0.0–1.0)
pH: 6 (ref 5.0–8.0)

## 2017-02-02 LAB — CBC WITH DIFFERENTIAL/PLATELET
BASOS ABS: 0.1 10*3/uL (ref 0.0–0.1)
Basophils Relative: 0.6 % (ref 0.0–3.0)
Eosinophils Absolute: 0 10*3/uL (ref 0.0–0.7)
Eosinophils Relative: 0 % (ref 0.0–5.0)
HEMATOCRIT: 35.6 % — AB (ref 36.0–46.0)
Hemoglobin: 11.8 g/dL — ABNORMAL LOW (ref 12.0–15.0)
LYMPHS ABS: 0.9 10*3/uL (ref 0.7–4.0)
LYMPHS PCT: 5.8 % — AB (ref 12.0–46.0)
MCHC: 33.3 g/dL (ref 30.0–36.0)
MCV: 94 fl (ref 78.0–100.0)
Monocytes Absolute: 1.9 10*3/uL — ABNORMAL HIGH (ref 0.1–1.0)
Monocytes Relative: 11.9 % (ref 3.0–12.0)
NEUTROS PCT: 81.7 % — AB (ref 43.0–77.0)
Neutro Abs: 12.8 10*3/uL — ABNORMAL HIGH (ref 1.4–7.7)
PLATELETS: 211 10*3/uL (ref 150.0–400.0)
RBC: 3.79 Mil/uL — ABNORMAL LOW (ref 3.87–5.11)
RDW: 13.4 % (ref 11.5–15.5)
WBC: 15.7 10*3/uL — AB (ref 4.0–10.5)

## 2017-02-02 MED ORDER — AMOXICILLIN 500 MG PO CAPS: 500.0000 mg | ORAL_CAPSULE | Freq: Three times a day (TID) | ORAL | 0 refills | Status: DC

## 2017-02-02 NOTE — Telephone Encounter (Signed)
Patient made aware and voiced her understanding.   Pc    Rx sent in.

## 2017-02-02 NOTE — Telephone Encounter (Signed)
I was gonna wait for urine culture but since she is feeling badly we can call in Amoxicillin 500 mg po tid x 3 days while we wait on results

## 2017-02-02 NOTE — Assessment & Plan Note (Signed)
Improving in past 24 hours but still notes some chills. Likely gastroenteritis. Encouraged clear fluids only for next 12 to 24 hours then proceed to [employer] as tolerated. Report worsening symptoms for further evaluation.

## 2017-02-02 NOTE — Assessment & Plan Note (Signed)
Symptomatic and urine mildly suspicious. Will proceed with Amoxicillin and await culture results

## 2017-02-02 NOTE — Telephone Encounter (Signed)
Please advise on medication to be sent in.     PC

## 2017-02-02 NOTE — Assessment & Plan Note (Signed)
minimize simple carbs. Increase exercise as tolerated.  

## 2017-02-02 NOTE — Assessment & Plan Note (Signed)
Improved with recheck, reminded to try and improve hydration with acute illness

## 2017-02-04 ENCOUNTER — Other Ambulatory Visit: Payer: Self-pay | Admitting: Family Medicine

## 2017-02-04 ENCOUNTER — Other Ambulatory Visit (INDEPENDENT_AMBULATORY_CARE_PROVIDER_SITE_OTHER): Payer: Medicare HMO

## 2017-02-04 DIAGNOSIS — R410 Disorientation, unspecified: Secondary | ICD-10-CM

## 2017-02-04 DIAGNOSIS — N390 Urinary tract infection, site not specified: Secondary | ICD-10-CM

## 2017-02-04 LAB — URINE CULTURE

## 2017-02-04 NOTE — Telephone Encounter (Signed)
Patient informed Scheduled appt today/put in order.

## 2017-02-04 NOTE — Telephone Encounter (Signed)
Patient has not finished the amoxicillan but is causing confusion or something else.  She had confusion before antibiotic and still presently a problem.

## 2017-02-04 NOTE — Progress Notes (Signed)
HPI: FU HTN and bradycardia. TSH January 2017 normal. Noted to be bradycardic with heart rate in the 40s previously and atenolol discontinued. Amlodipine added. Carotid Dopplers June 2017 negative.  Since last seen, she denies dyspnea, chest pain, palpitations or syncope. Chronic mild pedal edema.  Current Outpatient Prescriptions  Medication Sig Dispense Refill  . amLODipine (NORVASC) 5 MG tablet Take 1 tablet (5 mg total) by mouth daily. 90 tablet 3  . amoxicillin (AMOXIL) 500 MG capsule Take 1 capsule (500 mg total) by mouth 3 (three) times daily. 15 capsule 0  . Calcium Citrate-Vitamin D (CALCIUM CITRATE + D PO) Take 1 tablet by mouth daily.    . fexofenadine (ALLEGRA) 180 MG tablet Take 1 tablet (180 mg total) by mouth daily. 30 tablet 0  . ketoconazole (NIZORAL) 2 % cream Apply 1 application topically daily. Apply to belly button. 15 g 0  . Multiple Vitamins-Minerals (ONE-A-DAY WOMENS 50 PLUS PO) Take 1 each by mouth daily.    . Polyethylene Glycol 3350 GRAN     . potassium chloride SA (KLOR-CON M20) 20 MEQ tablet Take 1 tablet (20 mEq total) by mouth daily. 90 tablet 1  . Probiotic Product (PROBIOTIC DAILY PO) Take by mouth.    . psyllium (METAMUCIL) 58.6 % packet Take 1 packet by mouth daily.    Marland Kitchen triamterene-hydrochlorothiazide (MAXZIDE-25) 37.5-25 MG tablet Take 1 tablet by mouth daily. 90 tablet 2   No current facility-administered medications for this visit.      Past Medical History:  Diagnosis Date  . Colon polyps   . Diverticulitis   . Diverticulosis 01/04/2014  . History of chicken pox    childhood  . HTN (hypertension)   . Hyperglycemia 09/05/2013  . Lipoma of arm 12/09/2014   right  . Nocturia 02/08/2013  . Osteopenia 12/09/2014  . Personal history of colonic polyps 09/05/2013  . Preventative health care 08/14/2016  . Unspecified constipation 02/06/2013  . Urinary incontinence   . Viral infection characterized by skin and mucous membrane lesions 05/11/2013     Past Surgical History:  Procedure Laterality Date  . ABDOMINAL HYSTERECTOMY    . BLADDER SURGERY  2003   bladder tact  . EYE SURGERY     multiple bilateral  . RECTOCELE REPAIR Bilateral     Social History   Social History  . Marital status: Married    Spouse name: N/A  . Number of children: 4  . Years of education: N/A   Occupational History  . Not on file.   Social History Main Topics  . Smoking status: Never Smoker  . Smokeless tobacco: Never Used  . Alcohol use No  . Drug use: Unknown  . Sexual activity: Not on file   Other Topics Concern  . Not on file   Social History Narrative  . No narrative on file    Family History  Problem Relation Age of Onset  . Colon cancer Mother   . Hypertension Mother   . Heart disease Father   . Meniere's disease Father   . Heart disease Sister   . Arthritis Son     back  . Glaucoma Son   . Hypertension Son   . Cataracts Son   . Colon cancer Sister   . Alzheimer's disease Sister   . Glaucoma Sister   . Glaucoma Sister   . Hyperlipidemia Sister   . Hypertension Sister   . Cancer Sister     thyroid  . Diabetes  Sister   . Glaucoma Sister     ROS: She is recovering from a recent UTI but no fevers or chills, productive cough, hemoptysis, dysphasia, odynophagia, melena, hematochezia, rash, seizure activity, orthopnea, PND, claudication. Remaining systems are negative.  Physical Exam: Well-developed well-nourished in no acute distress.  Skin is warm and dry.  HEENT is normal.  Neck is supple.  Chest is clear to auscultation with normal expansion.  Cardiovascular exam is regular rate and rhythm.  Abdominal exam nontender or distended. No masses palpated. Extremities show trace edema; varicosities noted. neuro grossly intact  ECG- Sinus rhythm at a rate of 71. No ST changes. personally reviewed  A/P  1 hypertension-blood pressure is elevated. However she follows this closely at home and it is typically  controlled. Continue present medications and follow.  2 Bradycardia-resolved after DCing beta blocker. Avoid AV nodal blocking agents.  3 edema-continue present dose of diuretic. Her pedal edema is likely secondary to venous insufficiency. I asked her to keep her feet elevated and wear compression hose.   Kirk Ruths, MD

## 2017-02-04 NOTE — Telephone Encounter (Signed)
Need repeat UA with c and s

## 2017-02-05 ENCOUNTER — Other Ambulatory Visit: Payer: Self-pay | Admitting: Family Medicine

## 2017-02-05 LAB — URINALYSIS, ROUTINE W REFLEX MICROSCOPIC
Bilirubin Urine: NEGATIVE
KETONES UR: NEGATIVE
Nitrite: NEGATIVE
PH: 6 (ref 5.0–8.0)
UROBILINOGEN UA: 0.2 (ref 0.0–1.0)
Urine Glucose: NEGATIVE

## 2017-02-05 LAB — URINE CYTOLOGY ANCILLARY ONLY
BACTERIAL VAGINITIS: NEGATIVE
CANDIDA VAGINITIS: NEGATIVE

## 2017-02-05 MED ORDER — AMOXICILLIN 500 MG PO CAPS: 500.0000 mg | ORAL_CAPSULE | Freq: Three times a day (TID) | ORAL | 0 refills | Status: DC

## 2017-02-06 LAB — URINE CULTURE: ORGANISM ID, BACTERIA: NO GROWTH

## 2017-02-10 ENCOUNTER — Ambulatory Visit (INDEPENDENT_AMBULATORY_CARE_PROVIDER_SITE_OTHER): Payer: Medicare HMO | Admitting: Cardiology

## 2017-02-10 ENCOUNTER — Encounter: Payer: Self-pay | Admitting: Cardiology

## 2017-02-10 VITALS — BP 153/67 | HR 71 | Ht 59.0 in | Wt 113.0 lb

## 2017-02-10 DIAGNOSIS — I10 Essential (primary) hypertension: Secondary | ICD-10-CM | POA: Diagnosis not present

## 2017-02-10 DIAGNOSIS — R001 Bradycardia, unspecified: Secondary | ICD-10-CM

## 2017-02-10 NOTE — Patient Instructions (Signed)
Your physician wants you to follow-up in: ONE YEAR WITH DR CRENSHAW You will receive a reminder letter in the mail two months in advance. If you don't receive a letter, please call our office to schedule the follow-up appointment.   If you need a refill on your cardiac medications before your next appointment, please call your pharmacy.  

## 2017-02-19 NOTE — Progress Notes (Signed)
Subjective:   Amber Morgan is a 83 y.o. female who presents for Medicare Annual (Subsequent) preventive examination.  Review of Systems:  No ROS.  Medicare Wellness Visit. Cardiac Risk Factors include: dyslipidemia;advanced age (>72men, >15 women);sedentary lifestyle;hypertension Sleep patterns: Wakes to urinate 3-4x/night. Sleeps 6-7 hrs. Naps during the day.  Home Safety/Smoke Alarms: Feels safe in home. Smoke alarms in place.   Living environment; residence and Firearm Safety: Lives with husband. No stairs. Guns safely stored. Seat Belt Safety/Bike Helmet: Wears seat belt.   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 Wears bifocals. Dr.Digby every 3 months. Dental- Dentist every 6 months.  Female:   Pap-no longer screening due to age.       Mammo-    Last 02/11/15: BI-RADS Category 1:negative. [name]. Dexa scan-  Last 12/23/14:   Osteopenia. CCS- Last 01/08/16: "historical-polyp removed". No longer screening due to age.    Objective:     Vitals: BP (!) 144/60 (BP Location: Right Arm, Patient Position: Sitting, Cuff Size: Normal)   Pulse 80   Ht 4\' 11"  (1.499 m)   Wt 114 lb (51.7 kg)   SpO2 98%   BMI 23.03 kg/m   Body mass index is 23.03 kg/m.   Tobacco History  Smoking Status  . Never Smoker  Smokeless Tobacco  . Never Used     Counseling given: No   Past Medical History:  Diagnosis Date  . Allergy   . Colon polyps   . Diverticulitis   . Diverticulosis 01/04/2014  . History of chicken pox    childhood  . HTN (hypertension)   . Hyperglycemia 09/05/2013  . Lipoma of arm 12/09/2014   right  . Nocturia 02/08/2013  . Osteopenia 12/09/2014  . Personal history of colonic polyps 09/05/2013  . Preventative health care 08/14/2016  . Unspecified constipation 02/06/2013  . Urinary incontinence   . Viral infection characterized by skin and mucous membrane lesions 05/11/2013   Past Surgical History:  Procedure Laterality Date  . ABDOMINAL  HYSTERECTOMY    . BLADDER SURGERY  2003   bladder tact  . EYE SURGERY     multiple bilateral  . RECTOCELE REPAIR Bilateral    Family History  Problem Relation Age of Onset  . Colon cancer Mother   . Hypertension Mother   . Heart disease Father   . Meniere's disease Father   . Heart disease Sister   . Arthritis Son        back  . Glaucoma Son   . Hypertension Son   . Cataracts Son   . Colon cancer Sister   . Alzheimer's disease Sister   . Glaucoma Sister   . Glaucoma Sister   . Hyperlipidemia Sister   . Hypertension Sister   . Cancer Sister        thyroid  . Diabetes Sister   . Glaucoma Sister    History  Sexual Activity  . Sexual activity: Not Currently    Outpatient Encounter Prescriptions as of 02/22/2017  Medication Sig  . amLODipine (NORVASC) 5 MG tablet Take 1 tablet (5 mg total) by mouth daily.  . Calcium Citrate-Vitamin D (CALCIUM CITRATE + D PO) Take 1 tablet by mouth daily.  . fexofenadine (ALLEGRA) 180 MG tablet Take 1 tablet (180 mg total) by mouth daily.  . Multiple Vitamins-Minerals (ONE-A-DAY WOMENS 50 PLUS PO) Take 1 each by mouth daily.  . Polyethylene Glycol 3350 GRAN   .  potassium chloride SA (KLOR-CON M20) 20 MEQ tablet Take 1 tablet (20 mEq total) by mouth daily.  . Probiotic Product (PROBIOTIC DAILY PO) Take by mouth.  . triamterene-hydrochlorothiazide (MAXZIDE-25) 37.5-25 MG tablet Take 1 tablet by mouth daily.  Marland Kitchen ketoconazole (NIZORAL) 2 % cream Apply 1 application topically daily. Apply to belly button. (Patient not taking: Reported on 02/22/2017)  . psyllium (METAMUCIL) 58.6 % packet Take 1 packet by mouth daily.  . [DISCONTINUED] amoxicillin (AMOXIL) 500 MG capsule Take 1 capsule (500 mg total) by mouth 3 (three) times daily.   No facility-administered encounter medications on file as of 02/22/2017.     Activities of Daily Living In your present state of health, do you have any difficulty performing the following activities: 02/22/2017  02/28/2016  Hearing? N N  Vision? Y Y  Difficulty concentrating or making decisions? N N  Walking or climbing stairs? N N  Dressing or bathing? N N  Doing errands, shopping? N N  Preparing Food and eating ? N -  Using the Toilet? N -  In the past six months, have you accidently leaked urine? N -  Do you have problems with loss of bowel control? N -  Managing your Medications? N -  Managing your Finances? N -  Housekeeping or managing your Housekeeping? N -  Some recent data might be hidden    Patient Care Team: Mosie Lukes, MD as PCP - General (Family Medicine) Jerline Pain Mingo Amber, DO as Consulting Physician (Osteopathic Medicine) Rolan Lipa, MD as Consulting Physician (Gastroenterology) Lelon Perla, MD as Consulting Physician (Cardiology)    Assessment:    Physical assessment deferred to PCP.  Exercise Activities and Dietary recommendations Current Exercise Habits: The patient does not participate in regular exercise at present, Exercise limited by: None identified   Diet (meal preparation, eat out, water intake, caffeinated beverages, dairy products, fruits and vegetables): in general, a "healthy" diet     Goals      Patient Stated   . <enter goal here> (pt-stated)          Patient states she would like to decrease A1C.      Fall Risk Fall Risk  02/22/2017 02/28/2016 12/07/2014  Falls in the past year? No No No   Depression Screen PHQ 2/9 Scores 02/22/2017 02/28/2016 12/07/2014  PHQ - 2 Score 0 0 0     Cognitive Function MMSE - Mini Mental State Exam 02/22/2017  Orientation to time 5  Orientation to Place 5  Registration 3  Attention/ Calculation 4  Recall 1  Language- name 2 objects 2  Language- repeat 1  Language- follow 3 step command 3  Language- read & follow direction 1  Write a sentence 0  Copy design 1  Total score 26        Immunization History  Administered Date(s) Administered  . Influenza Split 07/13/2012  . Influenza, High  Dose Seasonal PF 06/22/2016  . Influenza,inj,Quad PF,36+ Mos 06/22/2013, 06/01/2014, 07/08/2015  . Pneumococcal Conjugate-13 06/01/2014  . Pneumococcal Polysaccharide-23 12/07/2009  . Tdap 05/21/2013  . Zoster 07/14/2011   Screening Tests Health Maintenance  Topic Date Due  . MAMMOGRAM  02/11/2016  . INFLUENZA VACCINE  05/05/2017  . TETANUS/TDAP  05/22/2023  . DEXA SCAN  Completed  . PNA vac Low Risk Adult  Completed      Plan:     Follow up with PCP as directed.  Continue to eat heart healthy diet (full of fruits, vegetables, whole grains, lean  protein, water--limit salt, fat, and sugar intake) and increase physical activity as tolerated.  Continue doing brain stimulating activities (puzzles, reading, adult coloring books, staying active) to keep memory sharp.    I have personally reviewed and noted the following in the patient's chart:   . Medical and social history . Use of alcohol, tobacco or illicit drugs  . Current medications and supplements . Functional ability and status . Nutritional status . Physical activity . Advanced directives . List of other physicians . Hospitalizations, surgeries, and ER visits in previous 12 months . Vitals . Screenings to include cognitive, depression, and falls . Referrals and appointments  In addition, I have reviewed and discussed with patient certain preventive protocols, quality metrics, and best practice recommendations. A written personalized care plan for preventive services as well as general preventive health recommendations were provided to patient.     Shela Nevin, [place]  02/22/2017

## 2017-02-22 ENCOUNTER — Encounter: Payer: Self-pay | Admitting: *Deleted

## 2017-02-22 ENCOUNTER — Ambulatory Visit (INDEPENDENT_AMBULATORY_CARE_PROVIDER_SITE_OTHER): Payer: Medicare HMO | Admitting: *Deleted

## 2017-02-22 VITALS — BP 144/60 | HR 80 | Ht 59.0 in | Wt 114.0 lb

## 2017-02-22 DIAGNOSIS — Z0000 Encounter for general adult medical examination without abnormal findings: Secondary | ICD-10-CM

## 2017-02-22 NOTE — Patient Instructions (Addendum)
Amber Morgan , Thank you for taking time to come for your Medicare Wellness Visit. I appreciate your ongoing commitment to your health goals. Please review the following plan we discussed and let me know if I can assist you in the future.   These are the goals we discussed: Goals      Patient Stated   . <enter goal here> (pt-stated)          Patient states she would like to decrease A1C.       This is a list of the screening recommended for you and due dates:  Health Maintenance  Topic Date Due  . Mammogram  02/11/2016  . Flu Shot  05/05/2017  . Tetanus Vaccine  05/22/2023  . DEXA scan (bone density measurement)  Completed  . Pneumonia vaccines  Completed   Continue to eat heart healthy diet (full of fruits, vegetables, whole grains, lean protein, water--limit salt, fat, and sugar intake) and increase physical activity as tolerated.  Continue doing brain stimulating activities (puzzles, reading, adult coloring books, staying active) to keep memory sharp.   Carbohydrate Counting for Diabetes Mellitus, Adult Carbohydrate counting is a method for keeping track of how many carbohydrates you eat. Eating carbohydrates naturally increases the amount of sugar (glucose) in the blood. Counting how many carbohydrates you eat helps keep your blood glucose within normal limits, which helps you manage your diabetes (diabetes mellitus). It is important to know how many carbohydrates you can safely have in each meal. This is different for every person. A diet and nutrition specialist (registered dietitian) can help you make a meal plan and calculate how many carbohydrates you should have at each meal and snack. Carbohydrates are found in the following foods:  Grains, such as breads and cereals.  Dried beans and soy products.  Starchy vegetables, such as potatoes, peas, and corn.  Fruit and fruit juices.  Milk and yogurt.  Sweets and snack foods, such as cake, cookies, candy, chips, and soft  drinks. How do I count carbohydrates? There are two ways to count carbohydrates in food. You can use either of the methods or a combination of both. Reading "Nutrition Facts" on packaged food  The "Nutrition Facts" list is included on the labels of almost all packaged foods and beverages in the [place] It includes:  The serving size.  Information about nutrients in each serving, including the grams (g) of carbohydrate per serving. To use the "Nutrition Facts":  Decide how many servings you will have.  Multiply the number of servings by the number of carbohydrates per serving.  The resulting number is the total amount of carbohydrates that you will be having. Learning standard serving sizes of other foods  When you eat foods containing carbohydrates that are not packaged or do not include "Nutrition Facts" on the label, you need to measure the servings in order to count the amount of carbohydrates:  Measure the foods that you will eat with a food scale or measuring cup, if needed.  Decide how many standard-size servings you will eat.  Multiply the number of servings by 15. Most carbohydrate-rich foods have about 15 g of carbohydrates per serving.  For example, if you eat 8 oz (170 g) of strawberries, you will have eaten 2 servings and 30 g of carbohydrates (2 servings x 15 g = 30 g).  For foods that have more than one food mixed, such as soups and casseroles, you must count the carbohydrates in each food that is included.  The following list contains standard serving sizes of common carbohydrate-rich foods. Each of these servings has about 15 g of carbohydrates:   hamburger bun or  English muffin.   oz (15 mL) syrup.   oz (14 g) jelly.  1 slice of bread.  1 six-inch tortilla.  3 oz (85 g) cooked rice or pasta.  4 oz (113 g) cooked dried beans.  4 oz (113 g) starchy vegetable, such as peas, corn, or potatoes.  4 oz (113 g) hot cereal.  4 oz (113 g) mashed potatoes or   of a large baked potato.  4 oz (113 g) canned or frozen fruit.  4 oz (120 mL) fruit juice.  4-6 crackers.  6 chicken nuggets.  6 oz (170 g) unsweetened dry cereal.  6 oz (170 g) plain fat-free yogurt or yogurt sweetened with artificial sweeteners.  8 oz (240 mL) milk.  8 oz (170 g) fresh fruit or one small piece of fruit.  24 oz (680 g) popped popcorn. Example of carbohydrate counting Sample meal   3 oz (85 g) chicken breast.  6 oz (170 g) brown rice.  4 oz (113 g) corn.  8 oz (240 mL) milk.  8 oz (170 g) strawberries with sugar-free whipped topping. Carbohydrate calculation  1. Identify the foods that contain carbohydrates:  Rice.  Corn.  Milk.  Strawberries. 2. Calculate how many servings you have of each food:  2 servings rice.  1 serving corn.  1 serving milk.  1 serving strawberries. 3. Multiply each number of servings by 15 g:  2 servings rice x 15 g = 30 g.  1 serving corn x 15 g = 15 g.  1 serving milk x 15 g = 15 g.  1 serving strawberries x 15 g = 15 g. 4. Add together all of the amounts to find the total grams of carbohydrates eaten:  30 g + 15 g + 15 g + 15 g = 75 g of carbohydrates total. This information is not intended to replace advice given to you by your health care provider. Make sure you discuss any questions you have with your health care provider. Document Released: 09/21/2005 Document Revised: 04/10/2016 Document Reviewed: 03/04/2016 Elsevier Interactive Patient Education  2017 [employer].

## 2017-03-03 ENCOUNTER — Other Ambulatory Visit: Payer: Self-pay | Admitting: Family Medicine

## 2017-03-09 ENCOUNTER — Other Ambulatory Visit: Payer: Self-pay | Admitting: Cardiology

## 2017-05-10 DIAGNOSIS — H401113 Primary open-angle glaucoma, right eye, severe stage: Secondary | ICD-10-CM | POA: Diagnosis not present

## 2017-05-10 DIAGNOSIS — H43811 Vitreous degeneration, right eye: Secondary | ICD-10-CM | POA: Diagnosis not present

## 2017-05-10 DIAGNOSIS — H524 Presbyopia: Secondary | ICD-10-CM | POA: Diagnosis not present

## 2017-05-10 DIAGNOSIS — H44522 Atrophy of globe, left eye: Secondary | ICD-10-CM | POA: Diagnosis not present

## 2017-05-10 DIAGNOSIS — Z961 Presence of intraocular lens: Secondary | ICD-10-CM | POA: Diagnosis not present

## 2017-05-10 DIAGNOSIS — H18422 Band keratopathy, left eye: Secondary | ICD-10-CM | POA: Diagnosis not present

## 2017-05-10 DIAGNOSIS — H10413 Chronic giant papillary conjunctivitis, bilateral: Secondary | ICD-10-CM | POA: Diagnosis not present

## 2017-05-20 ENCOUNTER — Other Ambulatory Visit: Payer: Self-pay | Admitting: Family Medicine

## 2017-06-04 ENCOUNTER — Encounter: Payer: Self-pay | Admitting: Family Medicine

## 2017-06-04 ENCOUNTER — Ambulatory Visit (INDEPENDENT_AMBULATORY_CARE_PROVIDER_SITE_OTHER): Payer: Medicare HMO | Admitting: Family Medicine

## 2017-06-04 VITALS — BP 156/50 | HR 60 | Temp 97.7°F | Ht 59.0 in | Wt 115.2 lb

## 2017-06-04 DIAGNOSIS — E785 Hyperlipidemia, unspecified: Secondary | ICD-10-CM | POA: Diagnosis not present

## 2017-06-04 DIAGNOSIS — I10 Essential (primary) hypertension: Secondary | ICD-10-CM | POA: Diagnosis not present

## 2017-06-04 DIAGNOSIS — E871 Hypo-osmolality and hyponatremia: Secondary | ICD-10-CM | POA: Diagnosis not present

## 2017-06-04 DIAGNOSIS — R001 Bradycardia, unspecified: Secondary | ICD-10-CM

## 2017-06-04 DIAGNOSIS — Z23 Encounter for immunization: Secondary | ICD-10-CM | POA: Diagnosis not present

## 2017-06-04 DIAGNOSIS — R739 Hyperglycemia, unspecified: Secondary | ICD-10-CM | POA: Diagnosis not present

## 2017-06-04 LAB — COMPREHENSIVE METABOLIC PANEL
ALK PHOS: 57 U/L (ref 39–117)
ALT: 14 U/L (ref 0–35)
AST: 18 U/L (ref 0–37)
Albumin: 4.1 g/dL (ref 3.5–5.2)
BILIRUBIN TOTAL: 0.5 mg/dL (ref 0.2–1.2)
BUN: 25 mg/dL — AB (ref 6–23)
CO2: 32 meq/L (ref 19–32)
Calcium: 11 mg/dL — ABNORMAL HIGH (ref 8.4–10.5)
Chloride: 106 mEq/L (ref 96–112)
Creatinine, Ser: 1.18 mg/dL (ref 0.40–1.20)
GFR: 45.95 mL/min — AB (ref >60.00)
GLUCOSE: 95 mg/dL (ref 70–99)
Potassium: 5.1 mEq/L (ref 3.5–5.1)
SODIUM: 146 meq/L — AB (ref 135–145)
TOTAL PROTEIN: 6.8 g/dL (ref 6.0–8.3)

## 2017-06-04 LAB — LIPID PANEL
CHOL/HDL RATIO: 3
Cholesterol: 145 mg/dL (ref 0–200)
HDL: 57.1 mg/dL (ref >39.00)
LDL Cholesterol: 75 mg/dL (ref 0–99)
NONHDL: 87.71
Triglycerides: 63 mg/dL (ref 0.0–149.0)
VLDL: 12.6 mg/dL (ref 0.0–40.0)

## 2017-06-04 LAB — CBC
HCT: 37.8 % (ref 36.0–46.0)
Hemoglobin: 12.4 g/dL (ref 12.0–15.0)
MCHC: 32.9 g/dL (ref 30.0–36.0)
MCV: 95.5 fl (ref 78.0–100.0)
Platelets: 209 10*3/uL (ref 150.0–400.0)
RBC: 3.96 Mil/uL (ref 3.87–5.11)
RDW: 12.9 % (ref 11.5–15.5)
WBC: 5.9 10*3/uL (ref 4.0–10.5)

## 2017-06-04 LAB — HEMOGLOBIN A1C: HEMOGLOBIN A1C: 6.2 % (ref 4.6–6.5)

## 2017-06-04 LAB — TSH: TSH: 2.59 u[IU]/mL (ref 0.35–4.50)

## 2017-06-04 NOTE — Assessment & Plan Note (Addendum)
Well controlled 110to130s over 40s to 5s, no changes to meds. Encouraged heart healthy diet such as the DASH diet and exercise as tolerated.

## 2017-06-04 NOTE — Assessment & Plan Note (Signed)
Encouraged heart healthy diet, increase exercise, avoid trans fats, consider a krill oil cap daily 

## 2017-06-04 NOTE — Progress Notes (Signed)
Pre visit review using our clinic review tool, if applicable. No additional management support is needed unless otherwise documented below in the visit note. 

## 2017-06-04 NOTE — Patient Instructions (Signed)

## 2017-06-04 NOTE — Assessment & Plan Note (Signed)
RRR today 

## 2017-06-04 NOTE — Assessment & Plan Note (Signed)
hgba1c acceptable, minimize simple carbs. Increase exercise as tolerated. Continue current meds 

## 2017-06-07 NOTE — Progress Notes (Signed)
Patient ID: Amber Morgan, female   DOB: 11-17-1926, 83 y.o.   MRN: [id number]   Subjective:    Patient ID: Amber Morgan, female    DOB: 10-28-1926, 83 y.o.   MRN: [id number]  Chief Complaint  Patient presents with  . Follow-up    HPI Patient is in today for follow up accompanied by her husband. She continues to be their primary care provider and is doing well. No recent febrile illness or hospitalization. She is maintaining a hert healthy diet and ocntinues to work in her garden regularly. No polyuria or polydipsia. Denies CP/palp/SOB/HA/congestion/fevers/GI or GU c/o. Taking meds as prescribed  Past Medical History:  Diagnosis Date  . Allergy   . Colon polyps   . Diverticulitis   . Diverticulosis 01/04/2014  . History of chicken pox    childhood  . HTN (hypertension)   . Hyperglycemia 09/05/2013  . Lipoma of arm 12/09/2014   right  . Nocturia 02/08/2013  . Osteopenia 12/09/2014  . Personal history of colonic polyps 09/05/2013  . Preventative health care 08/14/2016  . Unspecified constipation 02/06/2013  . Urinary incontinence   . Viral infection characterized by skin and mucous membrane lesions 05/11/2013    Past Surgical History:  Procedure Laterality Date  . ABDOMINAL HYSTERECTOMY    . BLADDER SURGERY  2003   bladder tact  . EYE SURGERY     multiple bilateral  . RECTOCELE REPAIR Bilateral     Family History  Problem Relation Age of Onset  . Colon cancer Mother   . Hypertension Mother   . Heart disease Father   . Meniere's disease Father   . Heart disease Sister   . Arthritis Son        back  . Glaucoma Son   . Hypertension Son   . Cataracts Son   . Colon cancer Sister   . Alzheimer's disease Sister   . Glaucoma Sister   . Glaucoma Sister   . Hyperlipidemia Sister   . Hypertension Sister   . Cancer Sister        thyroid  . Diabetes Sister   . Glaucoma Sister     Social History   Social History  . Marital status: Married    Spouse name: N/A  . Number of  children: 4  . Years of education: N/A   Occupational History  . Not on file.   Social History Main Topics  . Smoking status: Never Smoker  . Smokeless tobacco: Never Used  . Alcohol use No  . Drug use: No  . Sexual activity: Not Currently   Other Topics Concern  . Not on file   Social History Narrative  . No narrative on file    Outpatient Medications Prior to Visit  Medication Sig Dispense Refill  . amLODipine (NORVASC) 5 MG tablet TAKE ONE TABLET BY MOUTH ONCE DAILY 90 tablet 3  . Calcium Citrate-Vitamin D (CALCIUM CITRATE + D PO) Take 1 tablet by mouth daily.    . Multiple Vitamins-Minerals (ONE-A-DAY WOMENS 50 PLUS PO) Take 1 each by mouth daily.    . Potassium Chloride ER 20 MEQ TBCR TAKE 1 TABLET (20 MEQ TOTAL) BY MOUTH DAILY. 90 tablet 1  . Probiotic Product (PROBIOTIC DAILY PO) Take by mouth.    . triamterene-hydrochlorothiazide (MAXZIDE-25) 37.5-25 MG tablet TAKE 1 TABLET EVERY DAY 90 tablet 2  . fexofenadine (ALLEGRA) 180 MG tablet Take 1 tablet (180 mg total) by mouth daily. 30 tablet 0  . ketoconazole (  NIZORAL) 2 % cream Apply 1 application topically daily. Apply to belly button. (Patient not taking: Reported on 02/22/2017) 15 g 0  . Polyethylene Glycol 3350 GRAN     . psyllium (METAMUCIL) 58.6 % packet Take 1 packet by mouth daily.     No facility-administered medications prior to visit.     No Known Allergies  Review of Systems  Constitutional: Negative for fever and malaise/fatigue.  HENT: Negative for congestion.   Eyes: Negative for blurred vision and double vision.  Respiratory: Negative for shortness of breath.   Cardiovascular: Negative for chest pain, palpitations and leg swelling.  Gastrointestinal: Negative for abdominal pain, blood in stool and nausea.  Genitourinary: Negative for dysuria and frequency.  Musculoskeletal: Negative for falls.  Skin: Negative for rash.  Neurological: Negative for dizziness, loss of consciousness and headaches.    Endo/Heme/Allergies: Negative for environmental allergies.  Psychiatric/Behavioral: Negative for depression. The patient is not nervous/anxious.        Objective:    Physical Exam  Constitutional: She is oriented to person, place, and time. She appears well-developed and well-nourished. No distress.  HENT:  Head: Normocephalic and atraumatic.  Nose: Nose normal.  Eyes: Right eye exhibits no discharge. Left eye exhibits no discharge.  Neck: Normal range of motion. Neck supple.  Cardiovascular: Normal rate and regular rhythm.   Pulmonary/Chest: Effort normal and breath sounds normal.  Abdominal: Soft. Bowel sounds are normal. There is no tenderness.  Musculoskeletal: She exhibits no edema.  Neurological: She is alert and oriented to person, place, and time.  Skin: Skin is warm and dry.  Psychiatric: She has a normal mood and affect.  Nursing note and vitals reviewed.   BP (!) 156/50   Pulse 60   Temp 97.7 F (36.5 C) (Oral)   Ht 4\' 11"  (1.499 m)   Wt 115 lb 3.2 oz (52.3 kg)   SpO2 96%   BMI 23.27 kg/m  Wt Readings from Last 3 Encounters:  06/04/17 115 lb 3.2 oz (52.3 kg)  02/22/17 114 lb (51.7 kg)  02/10/17 113 lb (51.3 kg)     Lab Results  Component Value Date   WBC 5.9 06/04/2017   HGB 12.4 06/04/2017   HCT 37.8 06/04/2017   PLT 209.0 06/04/2017   GLUCOSE 95 06/04/2017   CHOL 145 06/04/2017   TRIG 63.0 06/04/2017   HDL 57.10 06/04/2017   LDLCALC 75 06/04/2017   ALT 14 06/04/2017   Morgan 18 06/04/2017   NA 146 (H) 06/04/2017   K 5.1 06/04/2017   CL 106 06/04/2017   CREATININE 1.18 06/04/2017   BUN 25 (H) 06/04/2017   CO2 32 06/04/2017   TSH 2.59 06/04/2017   HGBA1C 6.2 06/04/2017   MICROALBUR 0.8 08/06/2016    Lab Results  Component Value Date   TSH 2.59 06/04/2017   Lab Results  Component Value Date   WBC 5.9 06/04/2017   HGB 12.4 06/04/2017   HCT 37.8 06/04/2017   MCV 95.5 06/04/2017   PLT 209.0 06/04/2017   Lab Results  Component Value  Date   NA 146 (H) 06/04/2017   K 5.1 06/04/2017   CO2 32 06/04/2017   GLUCOSE 95 06/04/2017   BUN 25 (H) 06/04/2017   CREATININE 1.18 06/04/2017   BILITOT 0.5 06/04/2017   ALKPHOS 57 06/04/2017   Morgan 18 06/04/2017   ALT 14 06/04/2017   PROT 6.8 06/04/2017   ALBUMIN 4.1 06/04/2017   CALCIUM 11.0 (H) 06/04/2017   GFR 45.95 (L) 06/04/2017  Lab Results  Component Value Date   CHOL 145 06/04/2017   Lab Results  Component Value Date   HDL 57.10 06/04/2017   Lab Results  Component Value Date   LDLCALC 75 06/04/2017   Lab Results  Component Value Date   TRIG 63.0 06/04/2017   Lab Results  Component Value Date   CHOLHDL 3 06/04/2017   Lab Results  Component Value Date   HGBA1C 6.2 06/04/2017       Assessment & Plan:   Problem List Items Addressed This Visit    HTN (hypertension)    Well controlled 110to130s over 58s to 65s, no changes to meds. Encouraged heart healthy diet such as the DASH diet and exercise as tolerated.       Relevant Orders   CBC (Completed)   Comprehensive metabolic panel (Completed)   TSH (Completed)   Referral to Nutrition and Diabetes Services   Hyperlipidemia    Encouraged heart healthy diet, increase exercise, avoid trans fats, consider a krill oil cap daily      Relevant Orders   Lipid panel (Completed)   Referral to Nutrition and Diabetes Services   Hyperglycemia    hgba1c acceptable, minimize simple carbs. Increase exercise as tolerated. Continue current meds      Relevant Orders   Hemoglobin A1c (Completed)   Referral to Nutrition and Diabetes Services   Bradycardia    RRR today       Other Visit Diagnoses    Need for immunization against influenza       Encounter for immunization       Relevant Orders   Hemoglobin A1c (Completed)   CBC (Completed)   Comprehensive metabolic panel (Completed)   Lipid panel (Completed)   TSH (Completed)   Flu vaccine HIGH DOSE PF (Completed)      I have discontinued Ms. Troost  psyllium, fexofenadine, ketoconazole, and Polyethylene Glycol 3350. I am also having her maintain her Multiple Vitamins-Minerals (ONE-A-DAY WOMENS 50 PLUS PO), Probiotic Product (PROBIOTIC DAILY PO), Calcium Citrate-Vitamin D (CALCIUM CITRATE + D PO), Potassium Chloride ER, amLODipine, and triamterene-hydrochlorothiazide.  No orders of the defined types were placed in this encounter.    Penni Homans, MD

## 2017-06-08 NOTE — Addendum Note (Signed)
Addended by: Magdalene Molly A on: 06/08/2017 08:47 AM   Modules accepted: Orders

## 2017-06-16 ENCOUNTER — Other Ambulatory Visit (INDEPENDENT_AMBULATORY_CARE_PROVIDER_SITE_OTHER): Payer: Medicare HMO

## 2017-06-16 DIAGNOSIS — E871 Hypo-osmolality and hyponatremia: Secondary | ICD-10-CM | POA: Diagnosis not present

## 2017-06-16 LAB — COMPREHENSIVE METABOLIC PANEL
ALT: 15 U/L (ref 0–35)
AST: 19 U/L (ref 0–37)
Albumin: 4 g/dL (ref 3.5–5.2)
Alkaline Phosphatase: 55 U/L (ref 39–117)
BUN: 24 mg/dL — AB (ref 6–23)
CHLORIDE: 100 meq/L (ref 96–112)
CO2: 29 meq/L (ref 19–32)
Calcium: 10.1 mg/dL (ref 8.4–10.5)
Creatinine, Ser: 1.08 mg/dL (ref 0.40–1.20)
GFR: 50.89 mL/min — ABNORMAL LOW (ref >60.00)
GLUCOSE: 89 mg/dL (ref 70–99)
POTASSIUM: 4.1 meq/L (ref 3.5–5.1)
SODIUM: 138 meq/L (ref 135–145)
Total Bilirubin: 0.5 mg/dL (ref 0.2–1.2)
Total Protein: 6.4 g/dL (ref 6.0–8.3)

## 2017-08-09 ENCOUNTER — Other Ambulatory Visit: Payer: Self-pay

## 2017-08-09 MED ORDER — POTASSIUM CHLORIDE ER 20 MEQ PO TBCR: 20.0000 meq | EXTENDED_RELEASE_TABLET | Freq: Every day | ORAL | 1 refills | Status: DC

## 2017-08-30 NOTE — Telephone Encounter (Signed)
Created in error

## 2017-09-09 DIAGNOSIS — H10413 Chronic giant papillary conjunctivitis, bilateral: Secondary | ICD-10-CM | POA: Diagnosis not present

## 2017-09-09 DIAGNOSIS — Z961 Presence of intraocular lens: Secondary | ICD-10-CM | POA: Diagnosis not present

## 2017-09-09 DIAGNOSIS — H401113 Primary open-angle glaucoma, right eye, severe stage: Secondary | ICD-10-CM | POA: Diagnosis not present

## 2017-09-09 DIAGNOSIS — H43811 Vitreous degeneration, right eye: Secondary | ICD-10-CM | POA: Diagnosis not present

## 2017-09-09 DIAGNOSIS — H44522 Atrophy of globe, left eye: Secondary | ICD-10-CM | POA: Diagnosis not present

## 2017-09-09 DIAGNOSIS — H524 Presbyopia: Secondary | ICD-10-CM | POA: Diagnosis not present

## 2017-09-09 DIAGNOSIS — H18422 Band keratopathy, left eye: Secondary | ICD-10-CM | POA: Diagnosis not present

## 2017-09-17 ENCOUNTER — Encounter: Payer: Self-pay | Admitting: Family Medicine

## 2017-09-17 ENCOUNTER — Ambulatory Visit: Payer: Medicare HMO | Admitting: Family Medicine

## 2017-09-17 DIAGNOSIS — R739 Hyperglycemia, unspecified: Secondary | ICD-10-CM

## 2017-09-17 DIAGNOSIS — I10 Essential (primary) hypertension: Secondary | ICD-10-CM | POA: Diagnosis not present

## 2017-09-17 DIAGNOSIS — E785 Hyperlipidemia, unspecified: Secondary | ICD-10-CM

## 2017-09-17 LAB — COMPREHENSIVE METABOLIC PANEL
ALBUMIN: 4 g/dL (ref 3.5–5.2)
ALT: 13 U/L (ref 0–35)
AST: 15 U/L (ref 0–37)
Alkaline Phosphatase: 50 U/L (ref 39–117)
BUN: 26 mg/dL — AB (ref 6–23)
CHLORIDE: 106 meq/L (ref 96–112)
CO2: 28 mEq/L (ref 19–32)
CREATININE: 0.93 mg/dL (ref 0.40–1.20)
Calcium: 10.2 mg/dL (ref 8.4–10.5)
GFR: 60.44 mL/min (ref >60.00)
GLUCOSE: 89 mg/dL (ref 70–99)
POTASSIUM: 4.2 meq/L (ref 3.5–5.1)
SODIUM: 139 meq/L (ref 135–145)
Total Bilirubin: 0.6 mg/dL (ref 0.2–1.2)
Total Protein: 6.7 g/dL (ref 6.0–8.3)

## 2017-09-17 LAB — CBC
HEMATOCRIT: 35.7 % — AB (ref 36.0–46.0)
Hemoglobin: 12.1 g/dL (ref 12.0–15.0)
MCHC: 34 g/dL (ref 30.0–36.0)
MCV: 94.7 fl (ref 78.0–100.0)
Platelets: 243 10*3/uL (ref 150.0–400.0)
RBC: 3.77 Mil/uL — AB (ref 3.87–5.11)
RDW: 13.2 % (ref 11.5–15.5)
WBC: 8.5 10*3/uL (ref 4.0–10.5)

## 2017-09-17 LAB — TSH: TSH: 3.23 u[IU]/mL (ref 0.35–4.50)

## 2017-09-17 LAB — LIPID PANEL
CHOL/HDL RATIO: 2
Cholesterol: 136 mg/dL (ref 0–200)
HDL: 56.8 mg/dL (ref >39.00)
LDL CALC: 67 mg/dL (ref 0–99)
NONHDL: 78.91
Triglycerides: 62 mg/dL (ref 0.0–149.0)
VLDL: 12.4 mg/dL (ref 0.0–40.0)

## 2017-09-17 LAB — HEMOGLOBIN A1C: HEMOGLOBIN A1C: 6.2 % (ref 4.6–6.5)

## 2017-09-17 NOTE — Assessment & Plan Note (Signed)
Well controlled, no changes to meds. Encouraged heart healthy diet such as the DASH diet and exercise as tolerated.  °

## 2017-09-17 NOTE — Patient Instructions (Signed)
Shingrix is the new shingles shot 2 shots, over 2-6 months  Hypertension Hypertension is another name for high blood pressure. High blood pressure forces your heart to work harder to pump blood. This can cause problems over time. There are two numbers in a blood pressure reading. There is a top number (systolic) over a bottom number (diastolic). It is best to have a blood pressure below 120/80. Healthy choices can help lower your blood pressure. You may need medicine to help lower your blood pressure if:  Your blood pressure cannot be lowered with healthy choices.  Your blood pressure is higher than 130/80.  Follow these instructions at home: Eating and drinking  If directed, follow the DASH eating plan. This diet includes: ? Filling half of your plate at each meal with fruits and vegetables. ? Filling one quarter of your plate at each meal with whole grains. Whole grains include whole wheat pasta, brown rice, and whole grain bread. ? Eating or drinking low-fat dairy products, such as skim milk or low-fat yogurt. ? Filling one quarter of your plate at each meal with low-fat (lean) proteins. Low-fat proteins include fish, skinless chicken, eggs, beans, and tofu. ? Avoiding fatty meat, cured and processed meat, or chicken with skin. ? Avoiding premade or processed food.  Eat less than 1,500 mg of salt (sodium) a day.  Limit alcohol use to no more than 1 drink a day for nonpregnant women and 2 drinks a day for men. One drink equals 12 oz of beer, 5 oz of wine, or 1 oz of hard liquor. Lifestyle  Work with your doctor to stay at a healthy weight or to lose weight. Ask your doctor what the best weight is for you.  Get at least 30 minutes of exercise that causes your heart to beat faster (aerobic exercise) most days of the week. This may include walking, swimming, or biking.  Get at least 30 minutes of exercise that strengthens your muscles (resistance exercise) at least 3 days a week. This  may include lifting weights or pilates.  Do not use any products that contain nicotine or tobacco. This includes cigarettes and e-cigarettes. If you need help quitting, ask your doctor.  Check your blood pressure at home as told by your doctor.  Keep all follow-up visits as told by your doctor. This is important. Medicines  Take over-the-counter and prescription medicines only as told by your doctor. Follow directions carefully.  Do not skip doses of blood pressure medicine. The medicine does not work as well if you skip doses. Skipping doses also puts you at risk for problems.  Ask your doctor about side effects or reactions to medicines that you should watch for. Contact a doctor if:  You think you are having a reaction to the medicine you are taking.  You have headaches that keep coming back (recurring).  You feel dizzy.  You have swelling in your ankles.  You have trouble with your vision. Get help right away if:  You get a very bad headache.  You start to feel confused.  You feel weak or numb.  You feel faint.  You get very bad pain in your: ? Chest. ? Belly (abdomen).  You throw up (vomit) more than once.  You have trouble breathing. Summary  Hypertension is another name for high blood pressure.  Making healthy choices can help lower blood pressure. If your blood pressure cannot be controlled with healthy choices, you may need to take medicine. This information is  not intended to replace advice given to you by your health care provider. Make sure you discuss any questions you have with your health care provider. Document Released: 03/09/2008 Document Revised: 08/19/2016 Document Reviewed: 08/19/2016 Elsevier Interactive Patient Education  [employer].

## 2017-09-17 NOTE — Assessment & Plan Note (Signed)
Encouraged heart healthy diet, increase exercise, avoid trans fats, consider a krill oil cap daily 

## 2017-09-17 NOTE — Assessment & Plan Note (Signed)
hgba1c acceptable, minimize simple carbs. Increase exercise as tolerated. Continue current meds 

## 2017-09-17 NOTE — Progress Notes (Signed)
Subjective:  I acted as a Education administrator for [employer]. Yancey Flemings, [place]   Patient ID: Amber Morgan, female    DOB: June 18, 1927, 83 y.o.   MRN: [id number]  Chief Complaint  Patient presents with  . Follow-up    HPI  Patient is in today for follow visit and she is accompanied by her husband and son. She reports she is doing well. No recent febrile illness or acute hospitalizations. She is eating well. Is noting some mild urinary frequency but no hesitancy, dysuria or other urologic symptoms. Denies CP/palp/SOB/HA/congestion/fevers/GI or c/o. Taking meds as prescribed  Patient Care Team: Mosie Lukes, MD as PCP - General (Family Medicine) Jerline Pain Mingo Amber, DO as Consulting Physician (Osteopathic Medicine) Rolan Lipa, MD as Consulting Physician (Gastroenterology) Stanford Breed Denice Bors, MD as Consulting Physician (Cardiology)   Past Medical History:  Diagnosis Date  . Allergy   . Colon polyps   . Diverticulitis   . Diverticulosis 01/04/2014  . History of chicken pox    childhood  . HTN (hypertension)   . Hyperglycemia 09/05/2013  . Lipoma of arm 12/09/2014   right  . Nocturia 02/08/2013  . Osteopenia 12/09/2014  . Personal history of colonic polyps 09/05/2013  . Preventative health care 08/14/2016  . Unspecified constipation 02/06/2013  . Urinary incontinence   . Viral infection characterized by skin and mucous membrane lesions 05/11/2013    Past Surgical History:  Procedure Laterality Date  . ABDOMINAL HYSTERECTOMY    . BLADDER SURGERY  2003   bladder tact  . EYE SURGERY     multiple bilateral  . RECTOCELE REPAIR Bilateral     Family History  Problem Relation Age of Onset  . Colon cancer Mother   . Hypertension Mother   . Heart disease Father   . Meniere's disease Father   . Heart disease Sister   . Arthritis Son        back  . Glaucoma Son   . Hypertension Son   . Cataracts Son   . Colon cancer Sister   . Alzheimer's disease Sister   . Glaucoma Sister   . Glaucoma Sister    . Hyperlipidemia Sister   . Hypertension Sister   . Cancer Sister        thyroid  . Diabetes Sister   . Glaucoma Sister     Social History   Socioeconomic History  . Marital status: Married    Spouse name: Not on file  . Number of children: 4  . Years of education: Not on file  . Highest education level: Not on file  Social Needs  . Financial resource strain: Not on file  . Food insecurity - worry: Not on file  . Food insecurity - inability: Not on file  . Transportation needs - medical: Not on file  . Transportation needs - non-medical: Not on file  Occupational History  . Not on file  Tobacco Use  . Smoking status: Never Smoker  . Smokeless tobacco: Never Used  Substance and Sexual Activity  . Alcohol use: No  . Drug use: No  . Sexual activity: Not Currently  Other Topics Concern  . Not on file  Social History Narrative  . Not on file    Outpatient Medications Prior to Visit  Medication Sig Dispense Refill  . amLODipine (NORVASC) 5 MG tablet TAKE ONE TABLET BY MOUTH ONCE DAILY 90 tablet 3  . Calcium Citrate-Vitamin D (CALCIUM CITRATE + D PO) Take 1 tablet by mouth daily.    Marland Kitchen  Multiple Vitamins-Minerals (ONE-A-DAY WOMENS 50 PLUS PO) Take 1 each by mouth daily.    . Potassium Chloride ER 20 MEQ TBCR Take 20 mEq daily by mouth. 90 tablet 1  . Probiotic Product (PROBIOTIC DAILY PO) Take by mouth.    . triamterene-hydrochlorothiazide (MAXZIDE-25) 37.5-25 MG tablet TAKE 1 TABLET EVERY DAY 90 tablet 2   No facility-administered medications prior to visit.     No Known Allergies  Review of Systems  Constitutional: Negative for fever and malaise/fatigue.  HENT: Negative for congestion.   Eyes: Negative for blurred vision.  Respiratory: Negative for shortness of breath.   Cardiovascular: Negative for chest pain, palpitations and leg swelling.  Gastrointestinal: Negative for abdominal pain, blood in stool and nausea.  Genitourinary: Positive for frequency. Negative  for dysuria.  Musculoskeletal: Negative for falls.  Skin: Negative for rash.  Neurological: Negative for dizziness, loss of consciousness and headaches.  Endo/Heme/Allergies: Negative for environmental allergies.  Psychiatric/Behavioral: Negative for depression. The patient is not nervous/anxious.        Objective:    Physical Exam  Constitutional: She is oriented to person, place, and time. She appears well-developed and well-nourished. No distress.  HENT:  Head: Normocephalic and atraumatic.  Nose: Nose normal.  Eyes: Right eye exhibits no discharge. Left eye exhibits no discharge.  Neck: Normal range of motion. Neck supple.  Cardiovascular: Normal rate and regular rhythm.  No murmur heard. Pulmonary/Chest: Effort normal and breath sounds normal.  Abdominal: Soft. Bowel sounds are normal. There is no tenderness.  Musculoskeletal: She exhibits no edema.  Neurological: She is alert and oriented to person, place, and time.  Skin: Skin is warm and dry.  Psychiatric: She has a normal mood and affect.  Nursing note and vitals reviewed.   BP (!) 148/54 (BP Location: Left Arm, Patient Position: Sitting, Cuff Size: Normal)   Pulse 74   Temp 97.7 F (36.5 C) (Oral)   Resp 16   Ht 4' 11.06" (1.5 m)   Wt 119 lb (54 kg)   SpO2 98%   BMI 23.99 kg/m  Wt Readings from Last 3 Encounters:  09/17/17 119 lb (54 kg)  06/04/17 115 lb 3.2 oz (52.3 kg)  02/22/17 114 lb (51.7 kg)   BP Readings from Last 3 Encounters:  09/17/17 (!) 148/54  06/04/17 (!) 156/50  02/22/17 (!) 144/60     Immunization History  Administered Date(s) Administered  . Influenza Split 07/13/2012  . Influenza, High Dose Seasonal PF 06/22/2016, 06/04/2017  . Influenza,inj,Quad PF,6+ Mos 06/22/2013, 06/01/2014, 07/08/2015  . Pneumococcal Conjugate-13 06/01/2014  . Pneumococcal Polysaccharide-23 12/07/2009  . Tdap 05/21/2013  . Zoster 07/14/2011    Health Maintenance  Topic Date Due  . MAMMOGRAM  02/11/2016    . TETANUS/TDAP  05/22/2023  . INFLUENZA VACCINE  Completed  . DEXA SCAN  Completed  . PNA vac Low Risk Adult  Completed    Lab Results  Component Value Date   WBC 8.5 09/17/2017   HGB 12.1 09/17/2017   HCT 35.7 (L) 09/17/2017   PLT 243.0 09/17/2017   GLUCOSE 89 09/17/2017   CHOL 136 09/17/2017   TRIG 62.0 09/17/2017   HDL 56.80 09/17/2017   LDLCALC 67 09/17/2017   ALT 13 09/17/2017   Morgan 15 09/17/2017   NA 139 09/17/2017   K 4.2 09/17/2017   CL 106 09/17/2017   CREATININE 0.93 09/17/2017   BUN 26 (H) 09/17/2017   CO2 28 09/17/2017   TSH 3.23 09/17/2017   HGBA1C 6.2 09/17/2017  MICROALBUR 0.8 08/06/2016    Lab Results  Component Value Date   TSH 3.23 09/17/2017   Lab Results  Component Value Date   WBC 8.5 09/17/2017   HGB 12.1 09/17/2017   HCT 35.7 (L) 09/17/2017   MCV 94.7 09/17/2017   PLT 243.0 09/17/2017   Lab Results  Component Value Date   NA 139 09/17/2017   K 4.2 09/17/2017   CO2 28 09/17/2017   GLUCOSE 89 09/17/2017   BUN 26 (H) 09/17/2017   CREATININE 0.93 09/17/2017   BILITOT 0.6 09/17/2017   ALKPHOS 50 09/17/2017   Morgan 15 09/17/2017   ALT 13 09/17/2017   PROT 6.7 09/17/2017   ALBUMIN 4.0 09/17/2017   CALCIUM 10.2 09/17/2017   GFR 60.44 09/17/2017   Lab Results  Component Value Date   CHOL 136 09/17/2017   Lab Results  Component Value Date   HDL 56.80 09/17/2017   Lab Results  Component Value Date   LDLCALC 67 09/17/2017   Lab Results  Component Value Date   TRIG 62.0 09/17/2017   Lab Results  Component Value Date   CHOLHDL 2 09/17/2017   Lab Results  Component Value Date   HGBA1C 6.2 09/17/2017         Assessment & Plan:   Problem List Items Addressed This Visit    HTN (hypertension)    Well controlled, no changes to meds. Encouraged heart healthy diet such as the DASH diet and exercise as tolerated.       Relevant Orders   CBC (Completed)   Comprehensive metabolic panel (Completed)   TSH (Completed)    Hyperlipidemia    Encouraged heart healthy diet, increase exercise, avoid trans fats, consider a krill oil cap daily      Relevant Orders   Lipid panel (Completed)   Hyperglycemia    hgba1c acceptable, minimize simple carbs. Increase exercise as tolerated. Continue current meds      Relevant Orders   Hemoglobin A1c (Completed)      I am having Amber Morgan maintain her Multiple Vitamins-Minerals (ONE-A-DAY WOMENS 50 PLUS PO), Probiotic Product (PROBIOTIC DAILY PO), Calcium Citrate-Vitamin D (CALCIUM CITRATE + D PO), amLODipine, triamterene-hydrochlorothiazide, and Potassium Chloride ER.  No orders of the defined types were placed in this encounter.   CMA served as Education administrator during this visit. History, Physical and Plan performed by medical provider. Documentation and orders reviewed and attested to.  Penni Homans, MD

## 2017-09-22 ENCOUNTER — Telehealth: Payer: Self-pay | Admitting: Family Medicine

## 2017-09-22 NOTE — Telephone Encounter (Signed)
Left message for pt to return my call.  Notes recorded by Mosie Lukes, MD on 09/19/2017 at 3:59 PM EST Notify labs look stable, no concerns identified. No changes needed

## 2017-09-22 NOTE — Telephone Encounter (Signed)
Copied from [place]. Topic: Quick Communication - Lab Results >> Sep 22, 2017  1:52 PM Darl Householder, RMA wrote: Pt is requesting lab results

## 2017-09-22 NOTE — Telephone Encounter (Addendum)
Pt given results per Dr Charlett Blake, "Notify labs look stable,  no concerns identified. No changes needed"; pt verbalizes understanding; pt would like a copy of labs sent to her home address [id number] [place], [place] [address]; will route this request to [facility]

## 2017-09-22 NOTE — Telephone Encounter (Signed)
Results mailed to Pt.  

## 2017-11-25 ENCOUNTER — Ambulatory Visit (INDEPENDENT_AMBULATORY_CARE_PROVIDER_SITE_OTHER): Payer: Medicare HMO | Admitting: Family Medicine

## 2017-11-25 ENCOUNTER — Ambulatory Visit (HOSPITAL_BASED_OUTPATIENT_CLINIC_OR_DEPARTMENT_OTHER)
Admission: RE | Admit: 2017-11-25 | Discharge: 2017-11-25 | Disposition: A | Discharge status: Home / Self Care | Payer: Medicare HMO | Source: Ambulatory Visit | Attending: Family Medicine | Admitting: Family Medicine

## 2017-11-25 ENCOUNTER — Encounter: Payer: Self-pay | Admitting: Family Medicine

## 2017-11-25 VITALS — BP 154/54 | HR 77 | Temp 98.3°F | Resp 18 | Wt 116.6 lb

## 2017-11-25 DIAGNOSIS — R109 Unspecified abdominal pain: Secondary | ICD-10-CM

## 2017-11-25 DIAGNOSIS — I10 Essential (primary) hypertension: Secondary | ICD-10-CM | POA: Diagnosis not present

## 2017-11-25 DIAGNOSIS — R350 Frequency of micturition: Secondary | ICD-10-CM | POA: Diagnosis not present

## 2017-11-25 DIAGNOSIS — R739 Hyperglycemia, unspecified: Secondary | ICD-10-CM | POA: Diagnosis not present

## 2017-11-25 DIAGNOSIS — R1031 Right lower quadrant pain: Secondary | ICD-10-CM

## 2017-11-25 DIAGNOSIS — K5900 Constipation, unspecified: Secondary | ICD-10-CM

## 2017-11-25 NOTE — Patient Instructions (Addendum)
Omron is the good BP cuff Miralax with Benefiber once or twice daily Check Blood pressure daily after 10 minutes of quiet rest and send [place] a log in about a week Constipation, Adult Constipation is when a person has fewer bowel movements in a week than normal, has difficulty having a bowel movement, or has stools that are dry, hard, or larger than normal. Constipation may be caused by an underlying condition. It may become worse with age if a person takes certain medicines and does not take in enough fluids. Follow these instructions at home: Eating and drinking   Eat foods that have a lot of fiber, such as fresh fruits and vegetables, whole grains, and beans.  Limit foods that are high in fat, low in fiber, or overly processed, such as french fries, hamburgers, cookies, candies, and soda.  Drink enough fluid to keep your urine clear or pale yellow. General instructions  Exercise regularly or as told by your health care provider.  Go to the restroom when you have the urge to go. Do not hold it in.  Take over-the-counter and prescription medicines only as told by your health care provider. These include any fiber supplements.  Practice pelvic floor retraining exercises, such as deep breathing while relaxing the lower abdomen and pelvic floor relaxation during bowel movements.  Watch your condition for any changes.  Keep all follow-up visits as told by your health care provider. This is important. Contact a health care provider if:  You have pain that gets worse.  You have a fever.  You do not have a bowel movement after 4 days.  You vomit.  You are not hungry.  You lose weight.  You are bleeding from the anus.  You have thin, pencil-like stools. Get help right away if:  You have a fever and your symptoms suddenly get worse.  You leak stool or have blood in your stool.  Your abdomen is bloated.  You have severe pain in your abdomen.  You feel dizzy or you  faint. This information is not intended to replace advice given to you by your health care provider. Make sure you discuss any questions you have with your health care provider. Document Released: 06/19/2004 Document Revised: 04/10/2016 Document Reviewed: 03/11/2016 Elsevier Interactive Patient Education  2018 [employer].

## 2017-11-25 NOTE — Progress Notes (Signed)
Subjective:  I acted as a Education administrator for Dr. Charlett Blake. [name], [place]  Patient ID: Amber Morgan, female    DOB: 01/21/1927, 84 y.o.   MRN: [id number]  No chief complaint on file.   HPI  Patient is in today for an acute visit she states her "stomach does not feel right". She is accompanied by her daughter and is noting a swelling that comes to the right of her suprapubic region. She feels a burning sensation in that region at times. She has been noting some constipation at times as well. Had a good BM today and yesterday. Has also noted some urinary changes with intermittent frequency alternating with hesitancy. No blood in urine or stool. Denies CP/palp/SOB/HA/congestion/fevers/chills. Taking meds as prescribed  Patient Care Team: Mosie Lukes, MD as PCP - General (Family Medicine) Jerline Pain Mingo Amber, DO as Consulting Physician (Osteopathic Medicine) Rolan Lipa, MD as Consulting Physician (Gastroenterology) Stanford Breed Denice Bors, MD as Consulting Physician (Cardiology)   Past Medical History:  Diagnosis Date  . Allergy   . Colon polyps   . Diverticulitis   . Diverticulosis 01/04/2014  . History of chicken pox    childhood  . HTN (hypertension)   . Hyperglycemia 09/05/2013  . Lipoma of arm 12/09/2014   right  . Nocturia 02/08/2013  . Osteopenia 12/09/2014  . Personal history of colonic polyps 09/05/2013  . Preventative health care 08/14/2016  . Unspecified constipation 02/06/2013  . Urinary incontinence   . Viral infection characterized by skin and mucous membrane lesions 05/11/2013    Past Surgical History:  Procedure Laterality Date  . ABDOMINAL HYSTERECTOMY    . BLADDER SURGERY  2003   bladder tact  . EYE SURGERY     multiple bilateral  . RECTOCELE REPAIR Bilateral     Family History  Problem Relation Age of Onset  . Colon cancer Mother   . Hypertension Mother   . Heart disease Father   . Meniere's disease Father   . Heart disease Sister   . Arthritis Son        back  .  Glaucoma Son   . Hypertension Son   . Cataracts Son   . Colon cancer Sister   . Alzheimer's disease Sister   . Glaucoma Sister   . Glaucoma Sister   . Hyperlipidemia Sister   . Hypertension Sister   . Cancer Sister        thyroid  . Diabetes Sister   . Glaucoma Sister     Social History   Socioeconomic History  . Marital status: Married    Spouse name: Not on file  . Number of children: 4  . Years of education: Not on file  . Highest education level: Not on file  Social Needs  . Financial resource strain: Not on file  . Food insecurity - worry: Not on file  . Food insecurity - inability: Not on file  . Transportation needs - medical: Not on file  . Transportation needs - non-medical: Not on file  Occupational History  . Not on file  Tobacco Use  . Smoking status: Never Smoker  . Smokeless tobacco: Never Used  Substance and Sexual Activity  . Alcohol use: No  . Drug use: No  . Sexual activity: Not Currently  Other Topics Concern  . Not on file  Social History Narrative  . Not on file    Outpatient Medications Prior to Visit  Medication Sig Dispense Refill  . Calcium Citrate-Vitamin D (CALCIUM CITRATE +  D PO) Take 1 tablet by mouth daily.    . Multiple Vitamins-Minerals (ONE-A-DAY WOMENS 50 PLUS PO) Take 1 each by mouth daily.    . Potassium Chloride ER 20 MEQ TBCR Take 20 mEq daily by mouth. 90 tablet 1  . Probiotic Product (PROBIOTIC DAILY PO) Take by mouth.    . triamterene-hydrochlorothiazide (MAXZIDE-25) 37.5-25 MG tablet TAKE 1 TABLET EVERY DAY 90 tablet 2  . amLODipine (NORVASC) 5 MG tablet TAKE ONE TABLET BY MOUTH ONCE DAILY 90 tablet 3   No facility-administered medications prior to visit.     No Known Allergies  Review of Systems  Constitutional: Negative for fever and malaise/fatigue.  HENT: Negative for congestion.   Eyes: Negative for blurred vision.  Respiratory: Negative for shortness of breath.   Cardiovascular: Negative for chest pain,  palpitations and leg swelling.  Gastrointestinal: Negative for abdominal pain, blood in stool and nausea.  Genitourinary: Negative for dysuria and frequency.  Musculoskeletal: Negative for falls.  Skin: Negative for rash.  Neurological: Negative for dizziness, loss of consciousness and headaches.  Endo/Heme/Allergies: Negative for environmental allergies.  Psychiatric/Behavioral: Negative for depression. The patient is not nervous/anxious.        Objective:    Physical Exam  BP (!) 154/54 (BP Location: Left Arm, Patient Position: Sitting, Cuff Size: Normal)   Pulse 77   Temp 98.3 F (36.8 C) (Oral)   Resp 18   Wt 116 lb 9.6 oz (52.9 kg)   SpO2 98%   BMI 23.51 kg/m  Wt Readings from Last 3 Encounters:  11/25/17 116 lb 9.6 oz (52.9 kg)  09/17/17 119 lb (54 kg)  06/04/17 115 lb 3.2 oz (52.3 kg)   BP Readings from Last 3 Encounters:  11/25/17 (!) 154/54  09/19/17 128/64  06/04/17 (!) 156/50     Immunization History  Administered Date(s) Administered  . Influenza Split 07/13/2012  . Influenza, High Dose Seasonal PF 06/22/2016, 06/04/2017  . Influenza,inj,Quad PF,6+ Mos 06/22/2013, 06/01/2014, 07/08/2015  . Pneumococcal Conjugate-13 06/01/2014  . Pneumococcal Polysaccharide-23 12/07/2009  . Tdap 05/21/2013  . Zoster 07/14/2011    Health Maintenance  Topic Date Due  . MAMMOGRAM  02/11/2016  . TETANUS/TDAP  05/22/2023  . INFLUENZA VACCINE  Completed  . DEXA SCAN  Completed  . PNA vac Low Risk Adult  Completed    Lab Results  Component Value Date   WBC 7.6 11/25/2017   HGB 13.3 11/25/2017   HCT 39.2 11/25/2017   PLT 245.0 11/25/2017   GLUCOSE 101 (H) 11/25/2017   CHOL 136 09/17/2017   TRIG 62.0 09/17/2017   HDL 56.80 09/17/2017   LDLCALC 67 09/17/2017   ALT 14 11/25/2017   AST 18 11/25/2017   NA 139 11/25/2017   K 4.5 11/25/2017   CL 103 11/25/2017   CREATININE 1.01 11/25/2017   BUN 26 (H) 11/25/2017   CO2 30 11/25/2017   TSH 3.23 09/17/2017   HGBA1C  6.2 09/17/2017   MICROALBUR 0.8 08/06/2016    Lab Results  Component Value Date   TSH 3.23 09/17/2017   Lab Results  Component Value Date   WBC 7.6 11/25/2017   HGB 13.3 11/25/2017   HCT 39.2 11/25/2017   MCV 94.4 11/25/2017   PLT 245.0 11/25/2017   Lab Results  Component Value Date   NA 139 11/25/2017   K 4.5 11/25/2017   CO2 30 11/25/2017   GLUCOSE 101 (H) 11/25/2017   BUN 26 (H) 11/25/2017   CREATININE 1.01 11/25/2017   BILITOT  0.4 11/25/2017   ALKPHOS 46 11/25/2017   AST 18 11/25/2017   ALT 14 11/25/2017   PROT 6.8 11/25/2017   ALBUMIN 4.2 11/25/2017   CALCIUM 10.7 (H) 11/25/2017   GFR 54.93 (L) 11/25/2017   Lab Results  Component Value Date   CHOL 136 09/17/2017   Lab Results  Component Value Date   HDL 56.80 09/17/2017   Lab Results  Component Value Date   LDLCALC 67 09/17/2017   Lab Results  Component Value Date   TRIG 62.0 09/17/2017   Lab Results  Component Value Date   CHOLHDL 2 09/17/2017   Lab Results  Component Value Date   HGBA1C 6.2 09/17/2017         Assessment & Plan:   Problem List Items Addressed This Visit    HTN (hypertension)    She is interested in stopping her Maxzide due to her concerns regarding her sugar. She ultimately agrees to continue it. She will continue Amlodipine 2.5 mg daily and can add a second dose later in the day if bp remains over 160/100      Relevant Medications   amLODipine (NORVASC) 5 MG tablet   Constipation    Encouraged increased hydration and fiber in diet. Daily probiotics. If bowels not moving can use MOM 2 tbls po in 4 oz of warm prune juice by mouth every 2-3 days. If no results then repeat in 4 hours with  Dulcolax suppository pr, may repeat again in 4 more hours as needed. Seek care if symptoms worsen. Consider daily Miralax and/or Dulcolax if symptoms persist.       Hyperglycemia    hgba1c acceptable, minimize simple carbs. Increase exercise as tolerated.      Urination frequency     Check UA and culture.       Right lower quadrant abdominal pain    She describes an intermittent burning sensation. Probable hernia. Xray unremarkable will proceed with ultrasound.       Relevant Orders   DG Abd 2 Views (Completed)    Other Visit Diagnoses    Abdominal pain, unspecified abdominal location    -  Primary   Relevant Orders   Comprehensive metabolic panel (Completed)   CBC with Differential/Platelet (Completed)   Urinalysis   Urine Culture      I have changed Ailynn M. Copelan's amLODipine. I am also having her maintain her Multiple Vitamins-Minerals (ONE-A-DAY WOMENS 50 PLUS PO), Probiotic Product (PROBIOTIC DAILY PO), Calcium Citrate-Vitamin D (CALCIUM CITRATE + D PO), triamterene-hydrochlorothiazide, and Potassium Chloride ER.  Meds ordered this encounter  Medications  . amLODipine (NORVASC) 5 MG tablet    Sig: Take 0.5 tablets (2.5 mg total) by mouth daily.    Dispense:  90 tablet    Refill:  3    CMA served as scribe during this visit. History, Physical and Plan performed by medical provider. Documentation and orders reviewed and attested to.  Penni Homans, MD

## 2017-11-26 ENCOUNTER — Other Ambulatory Visit (INDEPENDENT_AMBULATORY_CARE_PROVIDER_SITE_OTHER): Payer: Medicare HMO

## 2017-11-26 LAB — COMPREHENSIVE METABOLIC PANEL
ALT: 14 U/L (ref 0–35)
AST: 18 U/L (ref 0–37)
Albumin: 4.2 g/dL (ref 3.5–5.2)
Alkaline Phosphatase: 46 U/L (ref 39–117)
BILIRUBIN TOTAL: 0.4 mg/dL (ref 0.2–1.2)
BUN: 26 mg/dL — AB (ref 6–23)
CALCIUM: 10.7 mg/dL — AB (ref 8.4–10.5)
CHLORIDE: 103 meq/L (ref 96–112)
CO2: 30 meq/L (ref 19–32)
CREATININE: 1.01 mg/dL (ref 0.40–1.20)
GFR: 54.93 mL/min — ABNORMAL LOW (ref >60.00)
GLUCOSE: 101 mg/dL — AB (ref 70–99)
Potassium: 4.5 mEq/L (ref 3.5–5.1)
SODIUM: 139 meq/L (ref 135–145)
Total Protein: 6.8 g/dL (ref 6.0–8.3)

## 2017-11-26 LAB — URINALYSIS, ROUTINE W REFLEX MICROSCOPIC
BILIRUBIN URINE: NEGATIVE
HGB URINE DIPSTICK: NEGATIVE
Ketones, ur: NEGATIVE
Nitrite: NEGATIVE
PH: 6 (ref 5.0–8.0)
RBC / HPF: NONE SEEN (ref 0–?)
Specific Gravity, Urine: 1.01 (ref 1.000–1.030)
Total Protein, Urine: NEGATIVE
Urine Glucose: NEGATIVE
Urobilinogen, UA: 0.2 (ref 0.0–1.0)

## 2017-11-26 LAB — CBC WITH DIFFERENTIAL/PLATELET
BASOS ABS: 0 10*3/uL (ref 0.0–0.1)
BASOS PCT: 0.6 % (ref 0.0–3.0)
Eosinophils Absolute: 0.1 10*3/uL (ref 0.0–0.7)
Eosinophils Relative: 1.6 % (ref 0.0–5.0)
HCT: 39.2 % (ref 36.0–46.0)
HEMOGLOBIN: 13.3 g/dL (ref 12.0–15.0)
LYMPHS PCT: 27.9 % (ref 12.0–46.0)
Lymphs Abs: 2.1 10*3/uL (ref 0.7–4.0)
MCHC: 33.8 g/dL (ref 30.0–36.0)
MCV: 94.4 fl (ref 78.0–100.0)
MONOS PCT: 7.1 % (ref 3.0–12.0)
Monocytes Absolute: 0.5 10*3/uL (ref 0.1–1.0)
NEUTROS ABS: 4.8 10*3/uL (ref 1.4–7.7)
Neutrophils Relative %: 62.8 % (ref 43.0–77.0)
PLATELETS: 245 10*3/uL (ref 150.0–400.0)
RBC: 4.16 Mil/uL (ref 3.87–5.11)
RDW: 12.9 % (ref 11.5–15.5)
WBC: 7.6 10*3/uL (ref 4.0–10.5)

## 2017-11-26 LAB — VITAMIN D 25 HYDROXY (VIT D DEFICIENCY, FRACTURES): VITD: 53.09 ng/mL (ref 30.00–100.00)

## 2017-11-27 DIAGNOSIS — R1031 Right lower quadrant pain: Secondary | ICD-10-CM | POA: Insufficient documentation

## 2017-11-27 MED ORDER — AMLODIPINE BESYLATE 5 MG PO TABS: 2.5000 mg | ORAL_TABLET | Freq: Every day | ORAL | 3 refills | Status: DC

## 2017-11-27 NOTE — Assessment & Plan Note (Signed)
Encouraged increased hydration and fiber in diet. Daily probiotics. If bowels not moving can use MOM 2 tbls po in 4 oz of warm prune juice by mouth every 2-3 days. If no results then repeat in 4 hours with  Dulcolax suppository pr, may repeat again in 4 more hours as needed. Seek care if symptoms worsen. Consider daily Miralax and/or Dulcolax if symptoms persist.  

## 2017-11-27 NOTE — Assessment & Plan Note (Signed)
hgba1c acceptable, minimize simple carbs. Increase exercise as tolerated.  

## 2017-11-27 NOTE — Assessment & Plan Note (Signed)
She is interested in stopping her Maxzide due to her concerns regarding her sugar. She ultimately agrees to continue it. She will continue Amlodipine 2.5 mg daily and can add a second dose later in the day if bp remains over 160/100

## 2017-11-27 NOTE — Assessment & Plan Note (Signed)
She describes an intermittent burning sensation. Probable hernia. Xray unremarkable will proceed with ultrasound.

## 2017-11-27 NOTE — Assessment & Plan Note (Signed)
Check UA and culture 

## 2017-11-28 LAB — URINE CULTURE
MICRO NUMBER: 90230495
SPECIMEN QUALITY: ADEQUATE

## 2017-11-29 ENCOUNTER — Telehealth: Payer: Self-pay | Admitting: Family Medicine

## 2017-11-29 NOTE — Telephone Encounter (Signed)
Copied from [place]. Topic: Quick Communication - See Telephone Encounter >> Nov 29, 2017  2:45 PM Ivar Drape wrote: CRM for notification. See Telephone encounter for:  11/29/17. Patient would like a call back with the results of the labs and Xrays she recently took.

## 2017-11-30 NOTE — Telephone Encounter (Signed)
Patient calling again, requesting call back [phone]

## 2017-11-30 NOTE — Telephone Encounter (Signed)
Patient calling again for her lab and xray results. She would like a call back at [phone]

## 2017-12-01 NOTE — Telephone Encounter (Signed)
Spoke with pt regarding results that have been completed

## 2017-12-02 ENCOUNTER — Other Ambulatory Visit: Payer: Self-pay | Admitting: *Deleted

## 2017-12-02 MED ORDER — CEFDINIR 300 MG PO CAPS: 300.0000 mg | ORAL_CAPSULE | Freq: Two times a day (BID) | ORAL | 0 refills | Status: DC

## 2017-12-07 ENCOUNTER — Other Ambulatory Visit: Payer: Self-pay | Admitting: Emergency Medicine

## 2017-12-07 ENCOUNTER — Other Ambulatory Visit (INDEPENDENT_AMBULATORY_CARE_PROVIDER_SITE_OTHER): Payer: Medicare HMO

## 2017-12-07 DIAGNOSIS — Z86010 Personal history of colonic polyps: Secondary | ICD-10-CM | POA: Diagnosis not present

## 2017-12-07 LAB — FECAL OCCULT BLOOD, IMMUNOCHEMICAL: Fecal Occult Bld: NEGATIVE

## 2017-12-09 DIAGNOSIS — H18422 Band keratopathy, left eye: Secondary | ICD-10-CM | POA: Diagnosis not present

## 2017-12-09 DIAGNOSIS — H10413 Chronic giant papillary conjunctivitis, bilateral: Secondary | ICD-10-CM | POA: Diagnosis not present

## 2017-12-09 DIAGNOSIS — H401113 Primary open-angle glaucoma, right eye, severe stage: Secondary | ICD-10-CM | POA: Diagnosis not present

## 2017-12-09 DIAGNOSIS — H44522 Atrophy of globe, left eye: Secondary | ICD-10-CM | POA: Diagnosis not present

## 2017-12-09 DIAGNOSIS — H43811 Vitreous degeneration, right eye: Secondary | ICD-10-CM | POA: Diagnosis not present

## 2017-12-09 DIAGNOSIS — Z961 Presence of intraocular lens: Secondary | ICD-10-CM | POA: Diagnosis not present

## 2017-12-10 ENCOUNTER — Ambulatory Visit (INDEPENDENT_AMBULATORY_CARE_PROVIDER_SITE_OTHER): Payer: Medicare HMO | Admitting: Family Medicine

## 2017-12-10 DIAGNOSIS — I10 Essential (primary) hypertension: Secondary | ICD-10-CM

## 2017-12-10 DIAGNOSIS — K5900 Constipation, unspecified: Secondary | ICD-10-CM | POA: Diagnosis not present

## 2017-12-10 DIAGNOSIS — R1031 Right lower quadrant pain: Secondary | ICD-10-CM | POA: Diagnosis not present

## 2017-12-10 DIAGNOSIS — R000 Tachycardia, unspecified: Secondary | ICD-10-CM

## 2017-12-10 DIAGNOSIS — E785 Hyperlipidemia, unspecified: Secondary | ICD-10-CM

## 2017-12-10 DIAGNOSIS — N3000 Acute cystitis without hematuria: Secondary | ICD-10-CM | POA: Diagnosis not present

## 2017-12-10 DIAGNOSIS — R739 Hyperglycemia, unspecified: Secondary | ICD-10-CM | POA: Diagnosis not present

## 2017-12-10 LAB — URINALYSIS, ROUTINE W REFLEX MICROSCOPIC
Bilirubin Urine: NEGATIVE
Ketones, ur: NEGATIVE
NITRITE: NEGATIVE
Specific Gravity, Urine: 1.015 (ref 1.000–1.030)
Urine Glucose: NEGATIVE
Urobilinogen, UA: 0.2 (ref 0.0–1.0)
pH: 6.5 (ref 5.0–8.0)

## 2017-12-10 LAB — CBC WITH DIFFERENTIAL/PLATELET
BASOS PCT: 0.6 % (ref 0.0–3.0)
Basophils Absolute: 0 10*3/uL (ref 0.0–0.1)
EOS ABS: 0.1 10*3/uL (ref 0.0–0.7)
EOS PCT: 1.4 % (ref 0.0–5.0)
HCT: 38 % (ref 36.0–46.0)
Hemoglobin: 12.9 g/dL (ref 12.0–15.0)
LYMPHS ABS: 2.1 10*3/uL (ref 0.7–4.0)
Lymphocytes Relative: 30.7 % (ref 12.0–46.0)
MCHC: 34.1 g/dL (ref 30.0–36.0)
MCV: 93.8 fl (ref 78.0–100.0)
MONO ABS: 0.7 10*3/uL (ref 0.1–1.0)
Monocytes Relative: 9.8 % (ref 3.0–12.0)
NEUTROS PCT: 57.5 % (ref 43.0–77.0)
Neutro Abs: 3.9 10*3/uL (ref 1.4–7.7)
Platelets: 251 10*3/uL (ref 150.0–400.0)
RBC: 4.05 Mil/uL (ref 3.87–5.11)
RDW: 12.8 % (ref 11.5–15.5)
WBC: 6.8 10*3/uL (ref 4.0–10.5)

## 2017-12-10 LAB — COMPREHENSIVE METABOLIC PANEL
ALT: 19 U/L (ref 0–35)
AST: 21 U/L (ref 0–37)
Albumin: 4.1 g/dL (ref 3.5–5.2)
Alkaline Phosphatase: 43 U/L (ref 39–117)
BUN: 19 mg/dL (ref 6–23)
CO2: 31 meq/L (ref 19–32)
CREATININE: 0.9 mg/dL (ref 0.40–1.20)
Calcium: 10.8 mg/dL — ABNORMAL HIGH (ref 8.4–10.5)
Chloride: 97 mEq/L (ref 96–112)
GFR: 62.74 mL/min (ref >60.00)
GLUCOSE: 103 mg/dL — AB (ref 70–99)
POTASSIUM: 4.5 meq/L (ref 3.5–5.1)
SODIUM: 134 meq/L — AB (ref 135–145)
Total Bilirubin: 0.5 mg/dL (ref 0.2–1.2)
Total Protein: 6.7 g/dL (ref 6.0–8.3)

## 2017-12-10 LAB — SEDIMENTATION RATE: SED RATE: 2 mm/h (ref 0–30)

## 2017-12-10 NOTE — Assessment & Plan Note (Addendum)
Mildly elevated secondary to increased stress and discomfort, no changes to meds. Encouraged heart healthy diet such as the DASH diet and exercise as tolerated.

## 2017-12-10 NOTE — Progress Notes (Signed)
Subjective:  I acted as a Education administrator for Dr. Charlett Blake. [name], [place]  Patient ID: Amber Morgan, female    DOB: 07-Apr-1927, 84 y.o.   MRN: [id number]  No chief complaint on file.   HPI  Patient is in today for 3 month follow up and she is accompanied by her husband and son. She is feeling better physically than at her last visit but is still worried about her RLQ discomfort which comes and goes. She had a urinary tract infection and with treatment of that she felt improved but she still has some suprapubic discomfort. No fevers or chills. She is still haivng mild constipation but Miralax has been helpful and she is taking 2 fiber caps twice a day. She has a long history of perineal pain s/p her bladder tack and rectocele repair many years ago. That pain is stable. No bloody or tarry stool. No anorexia, nausea or vomting. Denies CP/palp/SOB/HA/congestion/fevers. Taking meds as prescribed  Patient Care Team: Mosie Lukes, MD as PCP - General (Family Medicine) Jerline Pain Mingo Amber, DO as Consulting Physician (Osteopathic Medicine) Rolan Lipa, MD as Consulting Physician (Gastroenterology) Stanford Breed Denice Bors, MD as Consulting Physician (Cardiology)   Past Medical History:  Diagnosis Date  . Allergy   . Colon polyps   . Diverticulitis   . Diverticulosis 01/04/2014  . History of chicken pox    childhood  . HTN (hypertension)   . Hyperglycemia 09/05/2013  . Lipoma of arm 12/09/2014   right  . Nocturia 02/08/2013  . Osteopenia 12/09/2014  . Personal history of colonic polyps 09/05/2013  . Preventative health care 08/14/2016  . Unspecified constipation 02/06/2013  . Urinary incontinence   . Viral infection characterized by skin and mucous membrane lesions 05/11/2013    Past Surgical History:  Procedure Laterality Date  . ABDOMINAL HYSTERECTOMY    . BLADDER SURGERY  2003   bladder tact  . EYE SURGERY     multiple bilateral  . RECTOCELE REPAIR Bilateral     Family History  Problem Relation  Age of Onset  . Colon cancer Mother   . Hypertension Mother   . Heart disease Father   . Meniere's disease Father   . Heart disease Sister   . Arthritis Son        back  . Glaucoma Son   . Hypertension Son   . Cataracts Son   . Colon cancer Sister   . Alzheimer's disease Sister   . Glaucoma Sister   . Glaucoma Sister   . Hyperlipidemia Sister   . Hypertension Sister   . Cancer Sister        thyroid  . Diabetes Sister   . Glaucoma Sister     Social History   Socioeconomic History  . Marital status: Married    Spouse name: Not on file  . Number of children: 4  . Years of education: Not on file  . Highest education level: Not on file  Social Needs  . Financial resource strain: Not on file  . Food insecurity - worry: Not on file  . Food insecurity - inability: Not on file  . Transportation needs - medical: Not on file  . Transportation needs - non-medical: Not on file  Occupational History  . Not on file  Tobacco Use  . Smoking status: Never Smoker  . Smokeless tobacco: Never Used  Substance and Sexual Activity  . Alcohol use: No  . Drug use: No  . Sexual activity: Not Currently  Other Topics Concern  . Not on file  Social History Narrative  . Not on file    Outpatient Medications Prior to Visit  Medication Sig Dispense Refill  . amLODipine (NORVASC) 5 MG tablet Take 0.5 tablets (2.5 mg total) by mouth daily. 90 tablet 3  . Calcium Citrate-Vitamin D (CALCIUM CITRATE + D PO) Take 1 tablet by mouth daily.    . Multiple Vitamins-Minerals (ONE-A-DAY WOMENS 50 PLUS PO) Take 1 each by mouth daily.    . Potassium Chloride ER 20 MEQ TBCR Take 20 mEq daily by mouth. 90 tablet 1  . Probiotic Product (PROBIOTIC DAILY PO) Take by mouth.    . triamterene-hydrochlorothiazide (MAXZIDE-25) 37.5-25 MG tablet TAKE 1 TABLET EVERY DAY 90 tablet 2  . cefdinir (OMNICEF) 300 MG capsule Take 1 capsule (300 mg total) by mouth 2 (two) times daily. For 5 days 10 capsule 0   No  facility-administered medications prior to visit.     No Known Allergies  Review of Systems  Constitutional: Negative for fever and malaise/fatigue.  HENT: Negative for congestion.   Eyes: Negative for blurred vision.  Respiratory: Negative for cough and shortness of breath.   Cardiovascular: Negative for chest pain, palpitations and leg swelling.  Gastrointestinal: Positive for abdominal pain and constipation. Negative for blood in stool, diarrhea, heartburn, melena, nausea and vomiting.  Genitourinary: Positive for frequency and urgency. Negative for flank pain and hematuria.  Musculoskeletal: Negative for back pain.  Skin: Negative for rash.  Neurological: Negative for loss of consciousness and headaches.  Psychiatric/Behavioral: The patient is nervous/anxious.        Objective:    Physical Exam  Constitutional: She is oriented to person, place, and time. She appears well-developed and well-nourished. No distress.  HENT:  Head: Normocephalic and atraumatic.  Eyes: Conjunctivae are normal.  Neck: Normal range of motion. No thyromegaly present.  Cardiovascular: Normal rate and regular rhythm.  Pulmonary/Chest: Effort normal and breath sounds normal. She has no wheezes.  Abdominal: Soft. Bowel sounds are normal. She exhibits no distension and no mass. There is no tenderness. There is no rebound and no guarding.  Possible inguinal hernia on right fullness palpated with cough.  Musculoskeletal: Normal range of motion. She exhibits no edema or deformity.  Neurological: She is alert and oriented to person, place, and time.  Skin: Skin is warm and dry. She is not diaphoretic.  Psychiatric: She has a normal mood and affect.    BP (!) 151/56 (BP Location: Left Arm, Patient Position: Sitting, Cuff Size: Normal)   Pulse 73   Temp 98.1 F (36.7 C) (Oral)   Resp 18   Wt 118 lb 9.6 oz (53.8 kg)   SpO2 100%   BMI 23.91 kg/m  Wt Readings from Last 3 Encounters:  12/10/17 118 lb 9.6  oz (53.8 kg)  11/25/17 116 lb 9.6 oz (52.9 kg)  09/17/17 119 lb (54 kg)   BP Readings from Last 3 Encounters:  12/10/17 (!) 151/56  11/25/17 (!) 154/54  09/19/17 128/64     Immunization History  Administered Date(s) Administered  . Influenza Split 07/13/2012  . Influenza, High Dose Seasonal PF 06/22/2016, 06/04/2017  . Influenza,inj,Quad PF,6+ Mos 06/22/2013, 06/01/2014, 07/08/2015  . Pneumococcal Conjugate-13 06/01/2014  . Pneumococcal Polysaccharide-23 12/07/2009  . Tdap 05/21/2013  . Zoster 07/14/2011    Health Maintenance  Topic Date Due  . MAMMOGRAM  02/11/2016  . TETANUS/TDAP  05/22/2023  . INFLUENZA VACCINE  Completed  . DEXA SCAN  Completed  .  PNA vac Low Risk Adult  Completed    Lab Results  Component Value Date   WBC 6.8 12/10/2017   HGB 12.9 12/10/2017   HCT 38.0 12/10/2017   PLT 251.0 12/10/2017   GLUCOSE 103 (H) 12/10/2017   CHOL 136 09/17/2017   TRIG 62.0 09/17/2017   HDL 56.80 09/17/2017   LDLCALC 67 09/17/2017   ALT 19 12/10/2017   AST 21 12/10/2017   NA 134 (L) 12/10/2017   K 4.5 12/10/2017   CL 97 12/10/2017   CREATININE 0.90 12/10/2017   BUN 19 12/10/2017   CO2 31 12/10/2017   TSH 3.23 09/17/2017   HGBA1C 6.2 09/17/2017   MICROALBUR 0.8 08/06/2016    Lab Results  Component Value Date   TSH 3.23 09/17/2017   Lab Results  Component Value Date   WBC 6.8 12/10/2017   HGB 12.9 12/10/2017   HCT 38.0 12/10/2017   MCV 93.8 12/10/2017   PLT 251.0 12/10/2017   Lab Results  Component Value Date   NA 134 (L) 12/10/2017   K 4.5 12/10/2017   CO2 31 12/10/2017   GLUCOSE 103 (H) 12/10/2017   BUN 19 12/10/2017   CREATININE 0.90 12/10/2017   BILITOT 0.5 12/10/2017   ALKPHOS 43 12/10/2017   AST 21 12/10/2017   ALT 19 12/10/2017   PROT 6.7 12/10/2017   ALBUMIN 4.1 12/10/2017   CALCIUM 10.8 (H) 12/10/2017   GFR 62.74 12/10/2017   Lab Results  Component Value Date   CHOL 136 09/17/2017   Lab Results  Component Value Date   HDL 56.80  09/17/2017   Lab Results  Component Value Date   LDLCALC 67 09/17/2017   Lab Results  Component Value Date   TRIG 62.0 09/17/2017   Lab Results  Component Value Date   CHOLHDL 2 09/17/2017   Lab Results  Component Value Date   HGBA1C 6.2 09/17/2017         Assessment & Plan:   Problem List Items Addressed This Visit    HTN (hypertension)    Mildly elevated secondary to increased stress and discomfort, no changes to meds. Encouraged heart healthy diet such as the DASH diet and exercise as tolerated.       Relevant Orders   Comprehensive metabolic panel (Completed)   Hyperlipidemia    Encouraged heart healthy diet, increase exercise, avoid trans fats, consider a krill oil cap daily      Constipation    Encouraged increased hydration and fiber in diet. Daily probiotics. If bowels not moving can use MOM 2 tbls po in 4 oz of warm prune juice by mouth every 2-3 days. If no results then repeat in 4 hours with  Dulcolax suppository pr, may repeat again in 4 more hours as needed. Seek care if symptoms worsen. Consider daily Miralax and/or Dulcolax if symptoms persist. Overall she does feel this is improved today      Relevant Orders   CBC with Differential/Platelet (Completed)   Sedimentation rate (Completed)   Hyperglycemia    hgba1c acceptable, minimize simple carbs. Increase exercise as tolerated.       UTI (urinary tract infection)    Recent uti improved but still symptomatic will repeat testing      Relevant Orders   CBC with Differential/Platelet (Completed)   Urine Culture (Completed)   Urinalysis   Sedimentation rate (Completed)   Tachycardia    RRR today      Right lower quadrant abdominal pain   Relevant Orders  CBC with Differential/Platelet (Completed)   Urine Culture (Completed)   Urinalysis   Sedimentation rate (Completed)   CT Abdomen Pelvis Wo Contrast      I have discontinued Teneil M. Crafts's cefdinir. I am also having her maintain her  Multiple Vitamins-Minerals (ONE-A-DAY WOMENS 50 PLUS PO), Probiotic Product (PROBIOTIC DAILY PO), Calcium Citrate-Vitamin D (CALCIUM CITRATE + D PO), triamterene-hydrochlorothiazide, Potassium Chloride ER, and amLODipine.  No orders of the defined types were placed in this encounter.   CMA served as Education administrator during this visit. History, Physical and Plan performed by medical provider. Documentation and orders reviewed and attested to.  Penni Homans, MD

## 2017-12-10 NOTE — Assessment & Plan Note (Addendum)
Encouraged increased hydration and fiber in diet. Daily probiotics. If bowels not moving can use MOM 2 tbls po in 4 oz of warm prune juice by mouth every 2-3 days. If no results then repeat in 4 hours with  Dulcolax suppository pr, may repeat again in 4 more hours as needed. Seek care if symptoms worsen. Consider daily Miralax and/or Dulcolax if symptoms persist. Overall she does feel this is improved today

## 2017-12-10 NOTE — Assessment & Plan Note (Signed)
Encouraged heart healthy diet, increase exercise, avoid trans fats, consider a krill oil cap daily 

## 2017-12-10 NOTE — Assessment & Plan Note (Addendum)
Recent uti improved but still symptomatic will repeat testing

## 2017-12-10 NOTE — Assessment & Plan Note (Addendum)
hgba1c acceptable, minimize simple carbs. Increase exercise as tolerated.  

## 2017-12-10 NOTE — Patient Instructions (Addendum)
Encouraged increased hydration and fiber in diet. Daily probiotics. If bowels not moving can use MOM 2 tbls po in 4 oz of warm prune juice by mouth every 2-3 days. If no results then repeat in 4 hours with  Dulcolax suppository pr, may repeat again in 4 more hours as needed. Seek care if symptoms worsen. Consider daily Miralax and/or Dulcolax if symptoms persist.   Bone density shows osteopenia, which is thinner than normal but not as bad as osteoporosis. Recommend calcium intake of 1200 to 1500 mg daily, divided into roughly 3 doses. Best source is the diet and a single dairy serving is about 500 mg, a supplement of calcium citrate once or twice daily to balance diet is fine if not getting enough in diet. Also need Vitamin D 2000 IU caps, 1 cap daily if not already taking vitamin D. Also recommend weight baring exercise on hips and upper body to keep bones strong  Stop Citracal   Blood Pressure 100 to 135 over 60-90 If higher than 140/95 call us Inguinal Hernia, Adult An inguinal hernia is when fat or the intestines push through the area where the leg meets the lower belly (groin) and make a rounded lump (bulge). This condition happens over time. There are three types of inguinal hernias. These types include:  Hernias that can be pushed back into the belly (are reducible).  Hernias that cannot be pushed back into the belly (are incarcerated).  Hernias that cannot be pushed back into the belly and lose their blood supply (get strangulated). This type needs emergency surgery.  Follow these instructions at home: Lifestyle  Drink enough fluid to keep your urine (pee) clear or pale yellow.  Eat plenty of fruits, vegetables, and whole grains. These have a lot of fiber. Talk with your doctor if you have questions.  Avoid lifting heavy objects.  Avoid standing for long periods of time.  Do not use tobacco products. These include cigarettes, chewing tobacco, or e-cigarettes. If you need help  quitting, ask your doctor.  Try to stay at a healthy weight. General instructions  Do not try to force the hernia back in.  Watch your hernia for any changes in color or size. Let your doctor know if there are any changes.  Take over-the-counter and prescription medicines only as told by your doctor.  Keep all follow-up visits as told by your doctor. This is important. Contact a doctor if:  You have a fever.  You have new symptoms.  Your symptoms get worse. Get help right away if:  The area where the legs meets the lower belly has: ? Pain that gets worse suddenly. ? A bulge that gets bigger suddenly and does not go down. ? A bulge that turns red or purple. ? A bulge that is painful to the touch.  You are a man and your scrotum: ? Suddenly feels painful. ? Suddenly changes in size.  You feel sick to your stomach (nauseous) and this feeling does not go away.  You throw up (vomit) and this keeps happening.  You feel your heart beating a lot more quickly than normal.  You cannot poop (have a bowel movement) or pass gas. This information is not intended to replace advice given to you by your health care provider. Make sure you discuss any questions you have with your health care provider. Document Released: 10/22/2006 Document Revised: 02/27/2016 Document Reviewed: 08/01/2014 Elsevier Interactive Patient Education  2018 Elsevier

## 2017-12-12 DIAGNOSIS — R109 Unspecified abdominal pain: Secondary | ICD-10-CM | POA: Insufficient documentation

## 2017-12-12 NOTE — Assessment & Plan Note (Signed)
RRR today 

## 2017-12-13 LAB — URINE CULTURE
MICRO NUMBER: 90300055
SPECIMEN QUALITY: ADEQUATE

## 2017-12-14 ENCOUNTER — Telehealth: Payer: Self-pay | Admitting: Family Medicine

## 2017-12-14 MED ORDER — CEFUROXIME AXETIL 250 MG PO TABS: 250.0000 mg | ORAL_TABLET | Freq: Two times a day (BID) | ORAL | 0 refills | Status: DC

## 2017-12-14 NOTE — Telephone Encounter (Signed)
Spoke with Amber Morgan at [facility]; she verified that per note from Dr Charlett Blake dated 12/13/17," Notify has a UTI again with a different but, pseudomonas, treat with Cefuroxime 250 mg taabs, 1 tab po bid x 7 days. With probiotics and cranberry tabs and plenty of fluid intake"; pt verbalizes understanding.

## 2017-12-14 NOTE — Addendum Note (Signed)
Addended by: Magdalene Molly A on: 12/14/2017 09:47 AM   Modules accepted: Orders

## 2017-12-14 NOTE — Telephone Encounter (Signed)
Copied from [place]. Topic: Quick Communication - See Telephone Encounter >> Dec 14, 2017  1:00 PM Vernona Rieger wrote: CRM for notification. See Telephone encounter for:   12/14/17.  Patient said she did not know that cefUROXime (CEFTIN) 250 MG tablet was going to be called in for her and she has not picked it up and will not pick it up until she is told why it was called in? Please advise. [phone]

## 2017-12-16 ENCOUNTER — Ambulatory Visit: Payer: Medicare HMO | Admitting: Family Medicine

## 2018-01-02 ENCOUNTER — Ambulatory Visit (HOSPITAL_BASED_OUTPATIENT_CLINIC_OR_DEPARTMENT_OTHER)
Admission: RE | Admit: 2018-01-02 | Discharge: 2018-01-02 | Disposition: A | Discharge status: Home / Self Care | Payer: Medicare HMO | Source: Ambulatory Visit | Attending: Family Medicine | Admitting: Family Medicine

## 2018-01-02 DIAGNOSIS — N329 Bladder disorder, unspecified: Secondary | ICD-10-CM | POA: Insufficient documentation

## 2018-01-02 DIAGNOSIS — K4090 Unilateral inguinal hernia, without obstruction or gangrene, not specified as recurrent: Secondary | ICD-10-CM | POA: Diagnosis not present

## 2018-01-02 DIAGNOSIS — R1031 Right lower quadrant pain: Secondary | ICD-10-CM | POA: Insufficient documentation

## 2018-01-06 NOTE — Progress Notes (Signed)
Called patient..I have been unable to reach this patient by phone.    Sent letter to patient

## 2018-01-11 ENCOUNTER — Ambulatory Visit (INDEPENDENT_AMBULATORY_CARE_PROVIDER_SITE_OTHER): Payer: Medicare HMO | Admitting: Family Medicine

## 2018-01-11 ENCOUNTER — Encounter: Payer: Self-pay | Admitting: Family Medicine

## 2018-01-11 VITALS — BP 144/76 | HR 77 | Resp 16 | Ht 59.0 in | Wt 119.0 lb

## 2018-01-11 DIAGNOSIS — R1031 Right lower quadrant pain: Secondary | ICD-10-CM

## 2018-01-11 DIAGNOSIS — E871 Hypo-osmolality and hyponatremia: Secondary | ICD-10-CM

## 2018-01-11 DIAGNOSIS — R739 Hyperglycemia, unspecified: Secondary | ICD-10-CM | POA: Diagnosis not present

## 2018-01-11 DIAGNOSIS — K5900 Constipation, unspecified: Secondary | ICD-10-CM

## 2018-01-11 DIAGNOSIS — I10 Essential (primary) hypertension: Secondary | ICD-10-CM | POA: Diagnosis not present

## 2018-01-11 DIAGNOSIS — R350 Frequency of micturition: Secondary | ICD-10-CM | POA: Diagnosis not present

## 2018-01-11 LAB — BASIC METABOLIC PANEL
BUN: 19 mg/dL (ref 6–23)
CO2: 29 meq/L (ref 19–32)
CREATININE: 0.94 mg/dL (ref 0.40–1.20)
Calcium: 9.8 mg/dL (ref 8.4–10.5)
Chloride: 105 mEq/L (ref 96–112)
GFR: 59.66 mL/min — AB (ref >60.00)
Glucose, Bld: 88 mg/dL (ref 70–99)
Potassium: 4.2 mEq/L (ref 3.5–5.1)
SODIUM: 140 meq/L (ref 135–145)

## 2018-01-11 MED ORDER — AMLODIPINE BESYLATE 2.5 MG PO TABS: 2.5000 mg | ORAL_TABLET | Freq: Every day | ORAL | 1 refills | Status: DC

## 2018-01-11 NOTE — Patient Instructions (Signed)
[place] with Zinc ointment Constipation, Adult Constipation is when a person:  Poops (has a bowel movement) fewer times in a week than normal.  Has a hard time pooping.  Has poop that is dry, hard, or bigger than normal.  Follow these instructions at home: Eating and drinking   Eat foods that have a lot of fiber, such as: ? Fresh fruits and vegetables. ? Whole grains. ? Beans.  Eat less of foods that are high in fat, low in fiber, or overly processed, such as: ? [place] fries. ? Hamburgers. ? Cookies. ? Candy. ? Soda.  Drink enough fluid to keep your pee (urine) clear or pale yellow. General instructions  Exercise regularly or as told by your doctor.  Go to the restroom when you feel like you need to poop. Do not hold it in.  Take over-the-counter and prescription medicines only as told by your doctor. These include any fiber supplements.  Do pelvic floor retraining exercises, such as: ? Doing deep breathing while relaxing your lower belly (abdomen). ? Relaxing your pelvic floor while pooping.  Watch your condition for any changes.  Keep all follow-up visits as told by your doctor. This is important. Contact a doctor if:  You have pain that gets worse.  You have a fever.  You have not pooped for 4 days.  You throw up (vomit).  You are not hungry.  You lose weight.  You are bleeding from the anus.  You have thin, pencil-like poop (stool). Get help right away if:  You have a fever, and your symptoms suddenly get worse.  You leak poop or have blood in your poop.  Your belly feels hard or bigger than normal (is bloated).  You have very bad belly pain.  You feel dizzy or you faint. This information is not intended to replace advice given to you by your health care provider. Make sure you discuss any questions you have with your health care provider. Document Released: 03/09/2008 Document Revised: 04/10/2016 Document Reviewed:  03/11/2016 Elsevier Interactive Patient Education  2018 [employer].

## 2018-01-15 NOTE — Assessment & Plan Note (Signed)
Adequately controlled, no changes to meds. Encouraged heart healthy diet such as the DASH diet and exercise as tolerated.

## 2018-01-15 NOTE — Assessment & Plan Note (Signed)
minimize simple carbs. Increase exercise as tolerated.  

## 2018-01-15 NOTE — Assessment & Plan Note (Signed)
Greatly improved with Miralax and benefiber. Encouraged increased hydration and fiber in diet. Daily probiotics. If bowels not moving can use MOM 2 tbls po in 4 oz of warm prune juice by mouth every 2-3 days. If no results then repeat in 4 hours with  Dulcolax suppository pr, may repeat again in 4 more hours as needed. Seek care if symptoms worsen. Consider daily Miralax and/or Dulcolax if symptoms persist.

## 2018-01-15 NOTE — Assessment & Plan Note (Signed)
Resolved with treatment of UTI and constipation. Imaging did confirm a small inguinal hernia but it is asymptomatic, she will report if any changes occur.

## 2018-01-15 NOTE — Progress Notes (Signed)
Patient ID: Amber Morgan, female   DOB: 02/07/1927, 84 y.o.   MRN: [id number]   Subjective:    Patient ID: Amber Morgan, female    DOB: 1927/03/20, 84 y.o.   MRN: [id number]  Chief Complaint  Patient presents with  . Hernia    1 month follow up    HPI Patient is in today for follow up and she is feeling much better. She reports she feels better than she has felt in years. She is accompanied by her son and husband. She has been able to get her bowels moving comfortably with suggestions given and no bloody or tarry stool noted. Urinary frquency has improved with treatment of UTI. No fevers or chills. Her abdominal pain has resolved. Denies CP/palp/SOB/HA/congestion/fevers/GI or GU c/o. Taking meds as prescribed  Past Medical History:  Diagnosis Date  . Allergy   . Colon polyps   . Diverticulitis   . Diverticulosis 01/04/2014  . History of chicken pox    childhood  . HTN (hypertension)   . Hyperglycemia 09/05/2013  . Lipoma of arm 12/09/2014   right  . Nocturia 02/08/2013  . Osteopenia 12/09/2014  . Personal history of colonic polyps 09/05/2013  . Preventative health care 08/14/2016  . Unspecified constipation 02/06/2013  . Urinary incontinence   . Viral infection characterized by skin and mucous membrane lesions 05/11/2013    Past Surgical History:  Procedure Laterality Date  . ABDOMINAL HYSTERECTOMY    . BLADDER SURGERY  2003   bladder tact  . EYE SURGERY     multiple bilateral  . RECTOCELE REPAIR Bilateral     Family History  Problem Relation Age of Onset  . Colon cancer Mother   . Hypertension Mother   . Heart disease Father   . Meniere's disease Father   . Heart disease Sister   . Arthritis Son        back  . Glaucoma Son   . Hypertension Son   . Cataracts Son   . Colon cancer Sister   . Alzheimer's disease Sister   . Glaucoma Sister   . Glaucoma Sister   . Hyperlipidemia Sister   . Hypertension Sister   . Cancer Sister        thyroid  . Diabetes Sister   .  Glaucoma Sister     Social History   Socioeconomic History  . Marital status: Married    Spouse name: Not on file  . Number of children: 4  . Years of education: Not on file  . Highest education level: Not on file  Occupational History  . Not on file  Social Needs  . Financial resource strain: Not on file  . Food insecurity:    Worry: Not on file    Inability: Not on file  . Transportation needs:    Medical: Not on file    Non-medical: Not on file  Tobacco Use  . Smoking status: Never Smoker  . Smokeless tobacco: Never Used  Substance and Sexual Activity  . Alcohol use: No  . Drug use: No  . Sexual activity: Not Currently  Lifestyle  . Physical activity:    Days per week: Not on file    Minutes per session: Not on file  . Stress: Not on file  Relationships  . Social connections:    Talks on phone: Not on file    Gets together: Not on file    Attends religious service: Not on file    Active member  of club or organization: Not on file    Attends meetings of clubs or organizations: Not on file    Relationship status: Not on file  . Intimate partner violence:    Fear of current or ex partner: Not on file    Emotionally abused: Not on file    Physically abused: Not on file    Forced sexual activity: Not on file  Other Topics Concern  . Not on file  Social History Narrative  . Not on file    Outpatient Medications Prior to Visit  Medication Sig Dispense Refill  . KLOR-CON M20 20 MEQ tablet     . Multiple Vitamins-Minerals (ONE-A-DAY WOMENS 50 PLUS PO) Take 1 each by mouth daily.    . Potassium Chloride ER 20 MEQ TBCR Take 20 mEq daily by mouth. 90 tablet 1  . Probiotic Product (PROBIOTIC DAILY PO) Take by mouth.    . triamterene-hydrochlorothiazide (MAXZIDE-25) 37.5-25 MG tablet TAKE 1 TABLET EVERY DAY 90 tablet 2  . UNABLE TO FIND Med Name: Equate fiber powder bid    . amLODipine (NORVASC) 5 MG tablet Take 0.5 tablets (2.5 mg total) by mouth daily. 90 tablet 3    . Calcium Citrate-Vitamin D (CALCIUM CITRATE + D PO) Take 1 tablet by mouth daily.    . cefUROXime (CEFTIN) 250 MG tablet Take 1 tablet (250 mg total) by mouth 2 (two) times daily with a meal. 14 tablet 0   No facility-administered medications prior to visit.     No Known Allergies  Review of Systems  Constitutional: Negative for fever and malaise/fatigue.  HENT: Negative for congestion.   Eyes: Negative for blurred vision.  Respiratory: Negative for shortness of breath.   Cardiovascular: Negative for chest pain, palpitations and leg swelling.  Gastrointestinal: Negative for abdominal pain, blood in stool and nausea.  Genitourinary: Negative for dysuria and frequency.  Musculoskeletal: Negative for falls.  Skin: Negative for rash.  Neurological: Negative for dizziness, loss of consciousness and headaches.  Endo/Heme/Allergies: Negative for environmental allergies.  Psychiatric/Behavioral: Negative for depression. The patient is not nervous/anxious.        Objective:    Physical Exam  Constitutional: She is oriented to person, place, and time. No distress.  HENT:  Head: Normocephalic and atraumatic.  Eyes: Conjunctivae are normal.  Neck: Neck supple. No thyromegaly present.  Cardiovascular: Normal rate, regular rhythm and normal heart sounds.  Pulmonary/Chest: Effort normal and breath sounds normal. She has no wheezes.  Abdominal: She exhibits no distension and no mass.  Musculoskeletal: She exhibits no edema.  Lymphadenopathy:    She has no cervical adenopathy.  Neurological: She is alert and oriented to person, place, and time.  Skin: Skin is warm and dry. No rash noted. She is not diaphoretic.  Psychiatric: Judgment normal.    BP (!) 144/76   Pulse 77   Resp 16   Ht 4\' 11"  (1.499 m)   Wt 119 lb (54 kg)   SpO2 99%   BMI 24.04 kg/m  Wt Readings from Last 3 Encounters:  01/11/18 119 lb (54 kg)  12/10/17 118 lb 9.6 oz (53.8 kg)  11/25/17 116 lb 9.6 oz (52.9 kg)      Lab Results  Component Value Date   WBC 6.8 12/10/2017   HGB 12.9 12/10/2017   HCT 38.0 12/10/2017   PLT 251.0 12/10/2017   GLUCOSE 88 01/11/2018   CHOL 136 09/17/2017   TRIG 62.0 09/17/2017   HDL 56.80 09/17/2017   LDLCALC 67  09/17/2017   ALT 19 12/10/2017   Morgan 21 12/10/2017   NA 140 01/11/2018   K 4.2 01/11/2018   CL 105 01/11/2018   CREATININE 0.94 01/11/2018   BUN 19 01/11/2018   CO2 29 01/11/2018   TSH 3.23 09/17/2017   HGBA1C 6.2 09/17/2017   MICROALBUR 0.8 08/06/2016    Lab Results  Component Value Date   TSH 3.23 09/17/2017   Lab Results  Component Value Date   WBC 6.8 12/10/2017   HGB 12.9 12/10/2017   HCT 38.0 12/10/2017   MCV 93.8 12/10/2017   PLT 251.0 12/10/2017   Lab Results  Component Value Date   NA 140 01/11/2018   K 4.2 01/11/2018   CO2 29 01/11/2018   GLUCOSE 88 01/11/2018   BUN 19 01/11/2018   CREATININE 0.94 01/11/2018   BILITOT 0.5 12/10/2017   ALKPHOS 43 12/10/2017   Morgan 21 12/10/2017   ALT 19 12/10/2017   PROT 6.7 12/10/2017   ALBUMIN 4.1 12/10/2017   CALCIUM 9.8 01/11/2018   GFR 59.66 (L) 01/11/2018   Lab Results  Component Value Date   CHOL 136 09/17/2017   Lab Results  Component Value Date   HDL 56.80 09/17/2017   Lab Results  Component Value Date   LDLCALC 67 09/17/2017   Lab Results  Component Value Date   TRIG 62.0 09/17/2017   Lab Results  Component Value Date   CHOLHDL 2 09/17/2017   Lab Results  Component Value Date   HGBA1C 6.2 09/17/2017       Assessment & Plan:   Problem List Items Addressed This Visit    HTN (hypertension)    Adequately controlled, no changes to meds. Encouraged heart healthy diet such as the DASH diet and exercise as tolerated.       Relevant Medications   amLODipine (NORVASC) 2.5 MG tablet   Constipation    Greatly improved with Miralax and benefiber. Encouraged increased hydration and fiber in diet. Daily probiotics. If bowels not moving can use MOM 2 tbls po in 4  oz of warm prune juice by mouth every 2-3 days. If no results then repeat in 4 hours with  Dulcolax suppository pr, may repeat again in 4 more hours as needed. Seek care if symptoms worsen. Consider daily Miralax and/or Dulcolax if symptoms persist.       Hyperglycemia     minimize simple carbs. Increase exercise as tolerated.      Hyponatremia - Primary   Relevant Orders   Basic Metabolic Panel (BMET) (Completed)   Urination frequency    UTI and symptoms have improved.       Abdominal pain    Resolved with treatment of UTI and constipation. Imaging did confirm a small inguinal hernia but it is asymptomatic, she will report if any changes occur.          I have discontinued Tayja M. Moger's Calcium Citrate-Vitamin D (CALCIUM CITRATE + D PO), amLODipine, and cefUROXime. I am also having her start on amLODipine. Additionally, I am having her maintain her Multiple Vitamins-Minerals (ONE-A-DAY WOMENS 50 PLUS PO), Probiotic Product (PROBIOTIC DAILY PO), triamterene-hydrochlorothiazide, Potassium Chloride ER, KLOR-CON M20, and UNABLE TO FIND.  Meds ordered this encounter  Medications  . amLODipine (NORVASC) 2.5 MG tablet    Sig: Take 1 tablet (2.5 mg total) by mouth daily.    Dispense:  90 tablet    Refill:  1     Penni Homans, MD

## 2018-01-15 NOTE — Assessment & Plan Note (Signed)
UTI and symptoms have improved.

## 2018-02-11 NOTE — Progress Notes (Addendum)
Subjective:   Amber Morgan is a 84 y.o. female who presents for Medicare Annual (Subsequent) preventive examination.  Review of Systems: No ROS.  Medicare Wellness Visit. Additional risk factors are reflected in the social history. Cardiac Risk Factors include: advanced age (>44men, >65 women);hypertension;dyslipidemia Sleep patterns: Sleeps 6-7 hrs. Wakes a few times to urinate. Naps daily.  Home Safety/Smoke Alarms: Feels safe in home. Smoke alarms in place.  Living environment; residence and Adult nurse: lives with husband. No stairs.   Female:    Mammo- ordered       Dexa scan- ordered                            Objective:     Vitals: BP (!) 145/60 (BP Location: Left Arm, Patient Position: Sitting, Cuff Size: Normal)   Pulse 62   Ht 4\' 11"  (1.499 m)   Wt 117 lb 6.4 oz (53.3 kg)   SpO2 98%   BMI 23.71 kg/m   Body mass index is 23.71 kg/m.  Advanced Directives 02/15/2018 02/22/2017 08/14/2016 07/08/2015  Does Patient Have a Medical Advance Directive? Yes Yes Yes Yes  Type of Paramedic of [place];Living will [place];Living will [place];Living will [place];Living will  Does patient want to make changes to medical advance directive? No - Patient declined No - Patient declined - No - Patient declined  Copy of [place] in Chart? No - copy requested No - copy requested No - copy requested No - copy requested    Tobacco Social History   Tobacco Use  Smoking Status Never Smoker  Smokeless Tobacco Never Used     Counseling given: Not Answered   Clinical Intake:     Pain : No/denies pain                 Past Medical History:  Diagnosis Date  . Allergy   . Colon polyps   . Diverticulitis   . Diverticulosis 01/04/2014  . Hernia, inguinal, right   . History of chicken pox    childhood  . HTN (hypertension)   . Hyperglycemia 09/05/2013  . Lipoma of  arm 12/09/2014   right  . Nocturia 02/08/2013  . Osteopenia 12/09/2014  . Personal history of colonic polyps 09/05/2013  . Preventative health care 08/14/2016  . Unspecified constipation 02/06/2013  . Urinary incontinence   . Viral infection characterized by skin and mucous membrane lesions 05/11/2013   Past Surgical History:  Procedure Laterality Date  . ABDOMINAL HYSTERECTOMY    . BLADDER SURGERY  2003   bladder tact  . EYE SURGERY     multiple bilateral  . RECTOCELE REPAIR Bilateral    Family History  Problem Relation Age of Onset  . Colon cancer Mother   . Hypertension Mother   . Heart disease Father   . Meniere's disease Father   . Heart disease Sister   . Arthritis Son        back  . Glaucoma Son   . Hypertension Son   . Cataracts Son   . Colon cancer Sister   . Alzheimer's disease Sister   . Glaucoma Sister   . Glaucoma Sister   . Hyperlipidemia Sister   . Hypertension Sister   . Cancer Sister        thyroid  . Diabetes Sister   . Glaucoma Sister    Social  History   Socioeconomic History  . Marital status: Married    Spouse name: Not on file  . Number of children: 4  . Years of education: Not on file  . Highest education level: Not on file  Occupational History  . Not on file  Social Needs  . Financial resource strain: Not on file  . Food insecurity:    Worry: Not on file    Inability: Not on file  . Transportation needs:    Medical: Not on file    Non-medical: Not on file  Tobacco Use  . Smoking status: Never Smoker  . Smokeless tobacco: Never Used  Substance and Sexual Activity  . Alcohol use: No  . Drug use: No  . Sexual activity: Not Currently  Lifestyle  . Physical activity:    Days per week: Not on file    Minutes per session: Not on file  . Stress: Not on file  Relationships  . Social connections:    Talks on phone: Not on file    Gets together: Not on file    Attends religious service: Not on file    Active member of club or  organization: Not on file    Attends meetings of clubs or organizations: Not on file    Relationship status: Not on file  Other Topics Concern  . Not on file  Social History Narrative  . Not on file    Outpatient Encounter Medications as of 02/15/2018  Medication Sig  . amLODipine (NORVASC) 2.5 MG tablet Take 1 tablet (2.5 mg total) by mouth daily.  . Multiple Vitamins-Minerals (ONE-A-DAY WOMENS 50 PLUS PO) Take 1 each by mouth daily.  . Potassium Chloride ER 20 MEQ TBCR Take 20 mEq daily by mouth.  . Probiotic Product (PROBIOTIC DAILY PO) Take by mouth.  . triamterene-hydrochlorothiazide (MAXZIDE-25) 37.5-25 MG tablet TAKE 1 TABLET EVERY DAY  . UNABLE TO FIND Med Name: Equate fiber powder bid  . [DISCONTINUED] KLOR-CON M20 20 MEQ tablet    No facility-administered encounter medications on file as of 02/15/2018.     Activities of Daily Living In your present state of health, do you have any difficulty performing the following activities: 02/15/2018 02/22/2017  Hearing? N N  Vision? 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  Difficulty concentrating or making decisions? N N  Walking or climbing stairs? N N  Dressing or bathing? N N  Doing errands, shopping? N N  Preparing Food and eating ? N N  Using the Toilet? N N  In the past six months, have you accidently leaked urine? N N  Do you have problems with loss of bowel control? N N  Managing your Medications? N N  Managing your Finances? N N  Housekeeping or managing your Housekeeping? N N  Some recent data might be hidden    Patient Care Team: Mosie Lukes, MD as PCP - General (Family Medicine) Jerline Pain Mingo Amber, DO as Consulting Physician (Osteopathic Medicine) Rolan Lipa, MD as Consulting Physician (Gastroenterology) Stanford Breed Denice Bors, MD as Consulting Physician (Cardiology)    Assessment:   This is a routine wellness examination for [place]. Physical assessment deferred to PCP.  Exercise Activities and Dietary  recommendations Current Exercise Habits: The patient does not participate in regular exercise at present, Exercise limited by: None identified Diet (meal preparation, eat out, water intake, caffeinated beverages, dairy products, fruits and vegetables): in general, a "healthy" diet  , well balanced, on average, 3  meals per day   Goals    . maintain current health        Fall Risk Fall Risk  02/15/2018 06/04/2017 02/22/2017 02/28/2016 12/07/2014  Falls in the past year? No No No No No    Depression Screen PHQ 2/9 Scores 02/15/2018 06/04/2017 02/22/2017 02/28/2016  PHQ - 2 Score 0 0 0 0     Cognitive Function MMSE - Mini Mental State Exam 02/22/2017  Orientation to time 5  Orientation to Place 5  Registration 3  Attention/ Calculation 4  Recall 1  Language- name 2 objects 2  Language- repeat 1  Language- follow 3 step command 3  Language- read & follow direction 1  Write a sentence 0  Copy design 1  Total score 26        Immunization History  Administered Date(s) Administered  . Influenza Split 07/13/2012  . Influenza, High Dose Seasonal PF 06/22/2016, 06/04/2017  . Influenza,inj,Quad PF,6+ Mos 06/22/2013, 06/01/2014, 07/08/2015  . Pneumococcal Conjugate-13 06/01/2014  . Pneumococcal Polysaccharide-23 12/07/2009  . Tdap 05/21/2013  . Zoster 07/14/2011    Screening Tests Health Maintenance  Topic Date Due  . MAMMOGRAM  02/11/2016  . INFLUENZA VACCINE  05/05/2018  . TETANUS/TDAP  05/22/2023  . DEXA SCAN  Completed  . PNA vac Low Risk Adult  Completed      Plan:    Please schedule your next medicare wellness visit with me in 1 yr.  Follow up with Dr.Blyth as scheduled 04/15/18.  I have ordered your mammogram and bone density scan. Please schedule.  Continue to eat heart healthy diet (full of fruits, vegetables, whole grains, lean protein, water--limit salt, fat, and sugar intake) and increase physical activity as tolerated.  Continue doing brain stimulating  activities (puzzles, reading, adult coloring books, staying active) to keep memory sharp.   Bring a copy of your living will and/or healthcare power of attorney to your next office visit.    I have personally reviewed and noted the following in the patient's chart:   . Medical and social history . Use of alcohol, tobacco or illicit drugs  . Current medications and supplements . Functional ability and status . Nutritional status . Physical activity . Advanced directives . List of other physicians . Hospitalizations, surgeries, and ER visits in previous 12 months . Vitals . Screenings to include cognitive, depression, and falls . Referrals and appointments  In addition, I have reviewed and discussed with patient certain preventive protocols, quality metrics, and best practice recommendations. A written personalized care plan for preventive services as well as general preventive health recommendations were provided to patient.     Shela Nevin, [place]  02/15/2018  Medical screening examination/treatment was performed by qualified clinical staff member and as supervising physician I was immediately available for consultation/collaboration. I have reviewed documentation and agree with assessment and plan.  Penni Homans, MD

## 2018-02-15 ENCOUNTER — Ambulatory Visit: Payer: Medicare HMO | Admitting: *Deleted

## 2018-02-15 ENCOUNTER — Encounter: Payer: Self-pay | Admitting: *Deleted

## 2018-02-15 VITALS — BP 145/60 | HR 62 | Ht 59.0 in | Wt 117.4 lb

## 2018-02-15 DIAGNOSIS — Z1239 Encounter for other screening for malignant neoplasm of breast: Secondary | ICD-10-CM

## 2018-02-15 DIAGNOSIS — Z780 Asymptomatic menopausal state: Secondary | ICD-10-CM

## 2018-02-15 DIAGNOSIS — Z0000 Encounter for general adult medical examination without abnormal findings: Secondary | ICD-10-CM

## 2018-02-15 NOTE — Patient Instructions (Addendum)
Please schedule your next medicare wellness visit with me in 1 yr.  Follow up with Dr.Blyth as scheduled 04/15/18.  I have ordered your mammogram and bone density scan. Please schedule.  Continue to eat heart healthy diet (full of fruits, vegetables, whole grains, lean protein, water--limit salt, fat, and sugar intake) and increase physical activity as tolerated.  Continue doing brain stimulating activities (puzzles, reading, adult coloring books, staying active) to keep memory sharp.   Bring a copy of your living will and/or healthcare power of attorney to your next office visit.   Amber Morgan , Thank you for taking time to come for your Medicare Wellness Visit. I appreciate your ongoing commitment to your health goals. Please review the following plan we discussed and let me know if I can assist you in the future.     This is a list of the screening recommended for you and due dates:  Health Maintenance  Topic Date Due  . Mammogram  02/11/2016  . Flu Shot  05/05/2018  . Tetanus Vaccine  05/22/2023  . DEXA scan (bone density measurement)  Completed  . Pneumonia vaccines  Completed    Health Maintenance for Postmenopausal Women Menopause is a normal process in which your reproductive ability comes to an end. This process happens gradually over a span of months to years, usually between the ages of 66 and 27. Menopause is complete when you have missed 12 consecutive menstrual periods. It is important to talk with your health care provider about some of the most common conditions that affect postmenopausal women, such as heart disease, cancer, and bone loss (osteoporosis). Adopting a healthy lifestyle and getting preventive care can help to promote your health and wellness. Those actions can also lower your chances of developing some of these common conditions. What should I know about menopause? During menopause, you may experience a number of symptoms, such as:  Moderate-to-severe  hot flashes.  Night sweats.  Decrease in sex drive.  Mood swings.  Headaches.  Tiredness.  Irritability.  Memory problems.  Insomnia.  Choosing to treat or not to treat menopausal changes is an individual decision that you make with your health care provider. What should I know about hormone replacement therapy and supplements? Hormone therapy products are effective for treating symptoms that are associated with menopause, such as hot flashes and night sweats. Hormone replacement carries certain risks, especially as you become older. If you are thinking about using estrogen or estrogen with progestin treatments, discuss the benefits and risks with your health care provider. What should I know about heart disease and stroke? Heart disease, heart attack, and stroke become more likely as you age. This may be due, in part, to the hormonal changes that your body experiences during menopause. These can affect how your body processes dietary fats, triglycerides, and cholesterol. Heart attack and stroke are both medical emergencies. There are many things that you can do to help prevent heart disease and stroke:  Have your blood pressure checked at least every 1-2 years. High blood pressure causes heart disease and increases the risk of stroke.  If you are 26-25 years old, ask your health care provider if you should take aspirin to prevent a heart attack or a stroke.  Do not use any tobacco products, including cigarettes, chewing tobacco, or electronic cigarettes. If you need help quitting, ask your health care provider.  It is important to eat a healthy diet and maintain a healthy weight. ? Be sure to include plenty  of vegetables, fruits, low-fat dairy products, and lean protein. ? Avoid eating foods that are high in solid fats, added sugars, or salt (sodium).  Get regular exercise. This is one of the most important things that you can do for your health. ? Try to exercise for at least  150 minutes each week. The type of exercise that you do should increase your heart rate and make you sweat. This is known as moderate-intensity exercise. ? Try to do strengthening exercises at least twice each week. Do these in addition to the moderate-intensity exercise.  Know your numbers.Ask your health care provider to check your cholesterol and your blood glucose. Continue to have your blood tested as directed by your health care provider.  What should I know about cancer screening? There are several types of cancer. Take the following steps to reduce your risk and to catch any cancer development as early as possible. Breast Cancer  Practice breast self-awareness. ? This means understanding how your breasts normally appear and feel. ? It also means doing regular breast self-exams. Let your health care provider know about any changes, no matter how small.  If you are 42 or older, have a clinician do a breast exam (clinical breast exam or CBE) every year. Depending on your age, family history, and medical history, it may be recommended that you also have a yearly breast X-ray (mammogram).  If you have a family history of breast cancer, talk with your health care provider about genetic screening.  If you are at high risk for breast cancer, talk with your health care provider about having an MRI and a mammogram every year.  Breast cancer (BRCA) gene test is recommended for women who have family members with BRCA-related cancers. Results of the assessment will determine the need for genetic counseling and BRCA1 and for BRCA2 testing. BRCA-related cancers include these types: ? Breast. This occurs in males or females. ? Ovarian. ? Tubal. This may also be called fallopian tube cancer. ? Cancer of the abdominal or pelvic lining (peritoneal cancer). ? Prostate. ? Pancreatic.  Cervical, Uterine, and Ovarian Cancer Your health care provider may recommend that you be screened regularly for  cancer of the pelvic organs. These include your ovaries, uterus, and vagina. This screening involves a pelvic exam, which includes checking for microscopic changes to the surface of your cervix (Pap test).  For women ages 21-65, health care providers may recommend a pelvic exam and a Pap test every three years. For women ages 70-65, they may recommend the Pap test and pelvic exam, combined with testing for human papilloma virus (HPV), every five years. Some types of HPV increase your risk of cervical cancer. Testing for HPV may also be done on women of any age who have unclear Pap test results.  Other health care providers may not recommend any screening for nonpregnant women who are considered low risk for pelvic cancer and have no symptoms. Ask your health care provider if a screening pelvic exam is right for you.  If you have had past treatment for cervical cancer or a condition that could lead to cancer, you need Pap tests and screening for cancer for at least 20 years after your treatment. If Pap tests have been discontinued for you, your risk factors (such as having a new sexual partner) need to be reassessed to determine if you should start having screenings again. Some women have medical problems that increase the chance of getting cervical cancer. In these cases, your health  care provider may recommend that you have screening and Pap tests more often.  If you have a family history of uterine cancer or ovarian cancer, talk with your health care provider about genetic screening.  If you have vaginal bleeding after reaching menopause, tell your health care provider.  There are currently no reliable tests available to screen for ovarian cancer.  Lung Cancer Lung cancer screening is recommended for adults 23-43 years old who are at high risk for lung cancer because of a history of smoking. A yearly low-dose CT scan of the lungs is recommended if you:  Currently smoke.  Have a history of at  least 30 pack-years of smoking and you currently smoke or have quit within the past 15 years. A pack-year is smoking an average of one pack of cigarettes per day for one year.  Yearly screening should:  Continue until it has been 15 years since you quit.  Stop if you develop a health problem that would prevent you from having lung cancer treatment.  Colorectal Cancer  This type of cancer can be detected and can often be prevented.  Routine colorectal cancer screening usually begins at age 34 and continues through age 42.  If you have risk factors for colon cancer, your health care provider may recommend that you be screened at an earlier age.  If you have a family history of colorectal cancer, talk with your health care provider about genetic screening.  Your health care provider may also recommend using home test kits to check for hidden blood in your stool.  A small camera at the end of a tube can be used to examine your colon directly (sigmoidoscopy or colonoscopy). This is done to check for the earliest forms of colorectal cancer.  Direct examination of the colon should be repeated every 5-10 years until age 67. However, if early forms of precancerous polyps or small growths are found or if you have a family history or genetic risk for colorectal cancer, you may need to be screened more often.  Skin Cancer  Check your skin from head to toe regularly.  Monitor any moles. Be sure to tell your health care provider: ? About any new moles or changes in moles, especially if there is a change in a mole's shape or color. ? If you have a mole that is larger than the size of a pencil eraser.  If any of your family members has a history of skin cancer, especially at a young age, talk with your health care provider about genetic screening.  Always use sunscreen. Apply sunscreen liberally and repeatedly throughout the day.  Whenever you are outside, protect yourself by wearing long  sleeves, pants, a wide-brimmed hat, and sunglasses.  What should I know about osteoporosis? Osteoporosis is a condition in which bone destruction happens more quickly than new bone creation. After menopause, you may be at an increased risk for osteoporosis. To help prevent osteoporosis or the bone fractures that can happen because of osteoporosis, the following is recommended:  If you are 21-50 years old, get at least 1,000 mg of calcium and at least 600 mg of vitamin D per day.  If you are older than age 61 but younger than age 11, get at least 1,200 mg of calcium and at least 600 mg of vitamin D per day.  If you are older than age 33, get at least 1,200 mg of calcium and at least 800 mg of vitamin D per day.  Smoking  and excessive alcohol intake increase the risk of osteoporosis. Eat foods that are rich in calcium and vitamin D, and do weight-bearing exercises several times each week as directed by your health care provider. What should I know about how menopause affects my mental health? Depression may occur at any age, but it is more common as you become older. Common symptoms of depression include:  Low or sad mood.  Changes in sleep patterns.  Changes in appetite or eating patterns.  Feeling an overall lack of motivation or enjoyment of activities that you previously enjoyed.  Frequent crying spells.  Talk with your health care provider if you think that you are experiencing depression. What should I know about immunizations? It is important that you get and maintain your immunizations. These include:  Tetanus, diphtheria, and pertussis (Tdap) booster vaccine.  Influenza every year before the flu season begins.  Pneumonia vaccine.  Shingles vaccine.  Your health care provider may also recommend other immunizations. This information is not intended to replace advice given to you by your health care provider. Make sure you discuss any questions you have with your health care  provider. Document Released: 11/13/2005 Document Revised: 04/10/2016 Document Reviewed: 06/25/2015 Elsevier Interactive Patient Education  2018 [employer].

## 2018-02-23 ENCOUNTER — Ambulatory Visit: Payer: Medicare HMO | Admitting: *Deleted

## 2018-03-03 ENCOUNTER — Ambulatory Visit (HOSPITAL_BASED_OUTPATIENT_CLINIC_OR_DEPARTMENT_OTHER)
Admission: RE | Admit: 2018-03-03 | Discharge: 2018-03-03 | Disposition: A | Discharge status: Home / Self Care | Payer: Medicare HMO | Source: Ambulatory Visit | Attending: Family Medicine | Admitting: Family Medicine

## 2018-03-03 ENCOUNTER — Other Ambulatory Visit: Payer: Self-pay | Admitting: Family Medicine

## 2018-03-03 DIAGNOSIS — Z1239 Encounter for other screening for malignant neoplasm of breast: Secondary | ICD-10-CM

## 2018-03-03 DIAGNOSIS — Z780 Asymptomatic menopausal state: Secondary | ICD-10-CM | POA: Diagnosis not present

## 2018-03-03 DIAGNOSIS — M85851 Other specified disorders of bone density and structure, right thigh: Secondary | ICD-10-CM | POA: Diagnosis not present

## 2018-03-03 DIAGNOSIS — Z1231 Encounter for screening mammogram for malignant neoplasm of breast: Secondary | ICD-10-CM | POA: Diagnosis not present

## 2018-03-04 ENCOUNTER — Other Ambulatory Visit: Payer: Self-pay | Admitting: Family Medicine

## 2018-03-04 DIAGNOSIS — R928 Other abnormal and inconclusive findings on diagnostic imaging of breast: Secondary | ICD-10-CM

## 2018-03-05 HISTORY — PX: BREAST BIOPSY: SHX20

## 2018-03-07 ENCOUNTER — Telehealth: Payer: Self-pay | Admitting: *Deleted

## 2018-03-07 NOTE — Telephone Encounter (Signed)
Received Physician Orders from [place]; forwarded to provider/SLS 06/03

## 2018-03-10 ENCOUNTER — Other Ambulatory Visit: Payer: Self-pay | Admitting: Family Medicine

## 2018-03-14 ENCOUNTER — Other Ambulatory Visit: Payer: Self-pay | Admitting: Family Medicine

## 2018-03-14 ENCOUNTER — Ambulatory Visit
Admission: RE | Admit: 2018-03-14 | Discharge: 2018-03-14 | Disposition: A | Discharge status: Home / Self Care | Payer: Medicare HMO | Source: Ambulatory Visit | Attending: Family Medicine | Admitting: Family Medicine

## 2018-03-14 DIAGNOSIS — R928 Other abnormal and inconclusive findings on diagnostic imaging of breast: Secondary | ICD-10-CM

## 2018-03-14 DIAGNOSIS — R922 Inconclusive mammogram: Secondary | ICD-10-CM | POA: Diagnosis not present

## 2018-03-14 DIAGNOSIS — N6489 Other specified disorders of breast: Secondary | ICD-10-CM | POA: Diagnosis not present

## 2018-03-18 ENCOUNTER — Ambulatory Visit
Admission: RE | Admit: 2018-03-18 | Discharge: 2018-03-18 | Disposition: A | Discharge status: Home / Self Care | Payer: Medicare HMO | Source: Ambulatory Visit | Attending: Family Medicine | Admitting: Family Medicine

## 2018-03-18 ENCOUNTER — Other Ambulatory Visit: Payer: Self-pay | Admitting: Family Medicine

## 2018-03-18 DIAGNOSIS — N6310 Unspecified lump in the right breast, unspecified quadrant: Secondary | ICD-10-CM

## 2018-03-18 DIAGNOSIS — C50211 Malignant neoplasm of upper-inner quadrant of right female breast: Secondary | ICD-10-CM | POA: Diagnosis not present

## 2018-03-18 DIAGNOSIS — N6312 Unspecified lump in the right breast, upper inner quadrant: Secondary | ICD-10-CM | POA: Diagnosis not present

## 2018-03-18 DIAGNOSIS — R928 Other abnormal and inconclusive findings on diagnostic imaging of breast: Secondary | ICD-10-CM

## 2018-03-21 ENCOUNTER — Telehealth: Payer: Self-pay | Admitting: Oncology

## 2018-03-21 NOTE — Telephone Encounter (Signed)
No answer

## 2018-03-25 ENCOUNTER — Telehealth: Payer: Self-pay | Admitting: Oncology

## 2018-03-25 ENCOUNTER — Encounter: Payer: Self-pay | Admitting: *Deleted

## 2018-03-25 DIAGNOSIS — C50211 Malignant neoplasm of upper-inner quadrant of right female breast: Secondary | ICD-10-CM

## 2018-03-25 DIAGNOSIS — Z170 Estrogen receptor positive status [ER+]: Principal | ICD-10-CM

## 2018-03-25 DIAGNOSIS — Z853 Personal history of malignant neoplasm of breast: Secondary | ICD-10-CM | POA: Insufficient documentation

## 2018-03-25 NOTE — Telephone Encounter (Signed)
LVM on 6/17 and 6/21 in reference to the afternoon [facility] appointment on 6/26, packet will be mailed to patient

## 2018-03-28 ENCOUNTER — Telehealth: Payer: Self-pay | Admitting: Oncology

## 2018-03-28 ENCOUNTER — Encounter: Payer: Self-pay | Admitting: *Deleted

## 2018-03-28 NOTE — Telephone Encounter (Signed)
Received a call from [place] on behalf of patient to confirm afternoon BC on 6/26, packet already mailed to patient

## 2018-03-29 NOTE — Progress Notes (Signed)
[place]  [phone] [phone]) [phone]     ID: Amber Morgan DOB: 20-Dec-1926  MR#: [id number]  HRC#:[id number]  Patient Care Team: Mosie Lukes, MD as PCP - General (Family Medicine) Jerline Pain Mingo Amber, DO as Consulting Physician (Osteopathic Medicine) Rolan Lipa, MD as Consulting Physician (Gastroenterology) Stanford Breed Denice Bors, MD as Consulting Physician (Cardiology) [name], MD as Consulting Physician (General Surgery) Magrinat, Virgie Dad, MD as Consulting Physician (Oncology) Eppie Gibson, MD as Attending Physician (Radiation Oncology) OTHER MD:  CHIEF COMPLAINT: Estrogen receptor positive lobular breast cancer  CURRENT TREATMENT: Awaiting definitive surgery   HISTORY OF CURRENT ILLNESS: Amber Morgan had routine screening mammography on 03/03/2018 showing a possible abnormality in the right breast. She underwent unilateral right diagnostic mammography with tomography and right breast ultrasonography at [facility] on 03/14/2018 showing: breast density category C. In the right breast, at the 1 o'clock upper inner quadrant there is a hypoechoic mass measuring 0.9 x 0.9 x 0.7 cm located 5 cm from the nipple. Ultrasonography revealed no evidence of lymphadenopathy in the right axilla.   Accordingly on 03/18/2018 she proceeded to biopsy of the right breast area in question. The pathology from this procedure showed ([id number]): Invasive lobular carcinoma grade II, spanning 0.6 cm. Prognostic indicators significant for: estrogen receptor, 100% positive and progesterone receptor, 100% positive, both with strong staining intensity. Proliferation marker Ki67 at 2%. HER2 not amplified with ratios HER2/CEP17 signals 1.62 and average HER2 copies per cell 3.80  The patient's subsequent history is as detailed below.  INTERVAL HISTORY: Amber Morgan was evaluated in the multidisciplinary breast cancer clinic on 03/30/2018 accompanied by one of her sons ,  her daughter-in-law, and granddaughter. Her case was also presented at the multidisciplinary breast cancer conference on the same day. At that time a preliminary plan was proposed: Breast conserving surgery with no sentinel lymph node sampling, no chemotherapy, likely no radiation assuming clear margins, antiestrogens   REVIEW OF SYSTEMS: There were no specific symptoms leading to the original mammogram, which was routinely scheduled. She notes that her husband passed away a few days ago due to declining lung issues. She was his caregiver. She also enjoys gardening. The patient denies unusual headaches, visual changes, nausea, vomiting, stiff neck, dizziness, or gait imbalance. There has been no cough, phlegm production, or pleurisy, no chest pain or pressure, and no change in bowel or bladder habits. The patient denies fever, rash, bleeding, unexplained fatigue or unexplained weight loss. A detailed review of systems was otherwise entirely negative.   PAST MEDICAL HISTORY: Past Medical History:  Diagnosis Date  . Allergy   . Colon polyps   . Diverticulitis   . Diverticulosis 01/04/2014  . Hernia, inguinal, right   . History of chicken pox    childhood  . HTN (hypertension)   . Hyperglycemia 09/05/2013  . Lipoma of arm 12/09/2014   right  . Nocturia 02/08/2013  . Osteopenia 12/09/2014  . Personal history of colonic polyps 09/05/2013  . Preventative health care 08/14/2016  . Unspecified constipation 02/06/2013  . Urinary incontinence   . Viral infection characterized by skin and mucous membrane lesions 05/11/2013  Glaucoma  PAST SURGICAL HISTORY: Past Surgical History:  Procedure Laterality Date  . ABDOMINAL HYSTERECTOMY    . BLADDER SURGERY  2003   bladder tact  . EYE SURGERY     multiple bilateral  . RECTOCELE REPAIR Bilateral   Appendectomy and planned Cholescystectomy  FAMILY HISTORY Family History  Problem Relation Age of  Onset  . Colon cancer Mother   . Hypertension Mother   .  Heart disease Father   . Meniere's disease Father   . Heart disease Sister   . Arthritis Son        back  . Glaucoma Son   . Hypertension Son   . Cataracts Son   . Colon cancer Sister   . Alzheimer's disease Sister   . Glaucoma Sister   . Glaucoma Sister   . Hyperlipidemia Sister   . Hypertension Sister   . Cancer Sister        thyroid  . Diabetes Sister   . Glaucoma Sister   The patient's father died at age 57 due to MI. The patient's mother died at 76. The patient's mother had colon cancer diagnosed at age 61. There was a paternal aunt with breast cancer diagnosed at an old age. The patient's sister was diagnosed with colon cancer at age 20. She denies a history of ovarian cancer in the family.    GYNECOLOGIC HISTORY:  No LMP recorded. Patient has had a hysterectomy. Menarche: 84 years old Age at first live birth: 84 years old She is GXP4. She is status post total hysterectomy with bilateral salpingo-oophorectomy in 1978. The patient briefly took HRT, and she didn't have intense hot flashes. She never used contraception.    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 Her husband died in 04-10-2018 due to lung issues. At home is herself with no pets. The patient's son, Fritz Pickerel is retired from Research officer, trade union and [place]. The patient's son [name] is retired after 30 years in the [employer]. The patient's son, Alveta Heimlich is a Designer, industrial/product in [place]. The patient's son Monica Martinez, is an Scientist, research (physical sciences) in [place]. The patient has 8 grandchildren and 8 great- grandchildren. The patient attends [employer].      ADVANCED DIRECTIVES: The patient's son- Sherika Kubicki [phone]   HEALTH MAINTENANCE: Social History   Tobacco Use  . Smoking status: Never Smoker  . Smokeless tobacco: Never Used  Substance Use Topics  . Alcohol use: No  . Drug use: No     Colonoscopy: 12/2013 / Dr. Sherrye Payor / polyp removal  PAP:   Bone density: 12/18/16 T score of -0.6   No Known  Allergies  Current Outpatient Medications  Medication Sig Dispense Refill  . amLODipine (NORVASC) 2.5 MG tablet Take 1 tablet (2.5 mg total) by mouth daily. 90 tablet 1  . Multiple Vitamins-Minerals (ONE-A-DAY WOMENS 50 PLUS PO) Take 1 each by mouth daily.    . polyethylene glycol (MIRALAX / GLYCOLAX) packet Take 17 g by mouth 2 (two) times daily.    . potassium chloride SA (K-DUR,KLOR-CON) 20 MEQ tablet TAKE 1 TABLET BY MOUTH ONCE DAILY 90 tablet 1  . Probiotic Product (PROBIOTIC DAILY PO) Take by mouth.    . triamterene-hydrochlorothiazide (MAXZIDE-25) 37.5-25 MG tablet TAKE 1 TABLET EVERY DAY (Patient taking differently: pt states she is taking 1/2 tablet daily) 90 tablet 2  . UNABLE TO FIND Med Name: Equate fiber powder bid     No current facility-administered medications for this visit.     OBJECTIVE: Older white woman who appears stated age  53:   03/30/18 1258  BP: (!) 165/63  Pulse: 77  Resp: 18  Temp: 97.7 F (36.5 C)  SpO2: 100%     Body mass index is 23.41 kg/m.   Wt Readings from Last 3 Encounters:  03/30/18 115 lb 14.4 oz (52.6 kg)  02/15/18 117 lb 6.4 oz (53.3 kg)  01/11/18 119 lb (54 kg)      ECOG FS:2 - Symptomatic, <50% confined to bed  Ocular: Sclerae unicteric, left eye blindness Lymphatic: No cervical or supraclavicular adenopathy Lungs no rales or rhonchi Heart regular rate and rhythm Abd soft, nontender, positive bowel sounds MSK no focal spinal tenderness, no joint edema Neuro: non-focal, well-oriented, appropriate affect Breasts: The right breast is status post recent biopsy.  There is no palpable mass.  There are no skin or nipple changes of concern.  The left breast is benign.  Both axillae are benign.   LAB RESULTS:  CMP     Component Value Date/Time   NA 140 03/30/2018 1214   K 4.0 03/30/2018 1214   CL 103 03/30/2018 1214   CO2 30 03/30/2018 1214   GLUCOSE 96 03/30/2018 1214   BUN 31 (H) 03/30/2018 1214   CREATININE 0.97  03/30/2018 1214   CREATININE 0.79 05/25/2014 0853   CALCIUM 9.8 03/30/2018 1214   PROT 6.4 (L) 03/30/2018 1214   ALBUMIN 3.9 03/30/2018 1214   Morgan 20 03/30/2018 1214   ALT 21 03/30/2018 1214   ALKPHOS 54 03/30/2018 1214   BILITOT 0.3 03/30/2018 1214   GFRNONAA 51 (L) 03/30/2018 1214   GFRAA 59 (L) 03/30/2018 1214    No results found for: TOTALPROTELP, ALBUMINELP, A1GS, A2GS, BETS, BETA2SER, GAMS, MSPIKE, SPEI  No results found for: KPAFRELGTCHN, LAMBDASER, KAPLAMBRATIO  Lab Results  Component Value Date   WBC 5.6 03/30/2018   NEUTROABS 3.7 03/30/2018   HGB 11.7 03/30/2018   HCT 35.2 03/30/2018   MCV 95.6 03/30/2018   PLT 213 03/30/2018    @LASTCHEMISTRY @  No results found for: LABCA2  No components found for: QPYPPJ093  No results for input(s): INR in the last 168 hours.  No results found for: LABCA2  No results found for: OIZ124  No results found for: PYK998  No results found for: PJA250  No results found for: CA2729  No components found for: HGQUANT  No results found for: CEA1 / No results found for: CEA1   No results found for: AFPTUMOR  No results found for: CHROMOGRNA  No results found for: PSA1  Appointment on 03/30/2018  Component Date Value Ref Range Status  . WBC Count 03/30/2018 5.6  3.9 - 10.3 K/uL Final  . RBC 03/30/2018 3.68* 3.70 - 5.45 MIL/uL Final  . Hemoglobin 03/30/2018 11.7  11.6 - 15.9 g/dL Final  . HCT 03/30/2018 35.2  34.8 - 46.6 % Final  . MCV 03/30/2018 95.6  79.5 - 101.0 fL Final  . MCH 03/30/2018 31.7  25.1 - 34.0 pg Final  . MCHC 03/30/2018 33.2  31.5 - 36.0 g/dL Final  . RDW 03/30/2018 12.9  11.2 - 14.5 % Final  . Platelet Count 03/30/2018 213  145 - 400 K/uL Final  . Neutrophils Relative % 03/30/2018 67  % Final  . Neutro Abs 03/30/2018 3.7  1.5 - 6.5 K/uL Final  . Lymphocytes Relative 03/30/2018 24  % Final  . Lymphs Abs 03/30/2018 1.4  0.9 - 3.3 K/uL Final  . Monocytes Relative 03/30/2018 8  % Final  . Monocytes  Absolute 03/30/2018 0.4  0.1 - 0.9 K/uL Final  . Eosinophils Relative 03/30/2018 1  % Final  . Eosinophils Absolute 03/30/2018 0.1  0.0 - 0.5 K/uL Final  . Basophils Relative 03/30/2018 0  % Final  . Basophils Absolute 03/30/2018 0.0  0.0 - 0.1 K/uL Final   Performed  at [facility], [place] [address], [place], [place] [address]  . Sodium 03/30/2018 140  135 - 145 mmol/L Final   Please note reference intervals were recently updated.  . Potassium 03/30/2018 4.0  3.5 - 5.1 mmol/L Final  . Chloride 03/30/2018 103  98 - 111 mmol/L Final  . CO2 03/30/2018 30  22 - 32 mmol/L Final  . Glucose, Bld 03/30/2018 96  70 - 99 mg/dL Final  . BUN 03/30/2018 31* 8 - 23 mg/dL Final   Please note change in reference range.  . Creatinine 03/30/2018 0.97  0.44 - 1.00 mg/dL Final  . Calcium 03/30/2018 9.8  8.9 - 10.3 mg/dL Final  . Total Protein 03/30/2018 6.4* 6.5 - 8.1 g/dL Final  . Albumin 03/30/2018 3.9  3.5 - 5.0 g/dL Final  . Morgan 03/30/2018 20  15 - 41 U/L Final  . ALT 03/30/2018 21  0 - 44 U/L Final  . Alkaline Phosphatase 03/30/2018 54  38 - 126 U/L Final  . Total Bilirubin 03/30/2018 0.3  0.3 - 1.2 mg/dL Final  . GFR, Est Non Af Am 03/30/2018 51* >60 mL/min Final  . GFR, Est AFR Am 03/30/2018 59* >60 mL/min Final   Comment: (NOTE) The eGFR has been calculated using the CKD EPI equation. This calculation has not been validated in all clinical situations. eGFR's persistently <60 mL/min signify possible Chronic Kidney Disease.   Georgiann Hahn gap 03/30/2018 7  5 - 15 Final   Performed at [facility], [address] [place],  [address]    (this displays the last labs from the last 3 days)  No results found for: TOTALPROTELP, ALBUMINELP, A1GS, A2GS, BETS, BETA2SER, GAMS, MSPIKE, SPEI (this displays SPEP labs)  No results found for: KPAFRELGTCHN, LAMBDASER, KAPLAMBRATIO (kappa/lambda light chains)  No results found for: HGBA, HGBA2QUANT,  HGBFQUANT, HGBSQUAN (Hemoglobinopathy evaluation)   No results found for: LDH  No results found for: IRON, TIBC, IRONPCTSAT (Iron and TIBC)  No results found for: FERRITIN  Urinalysis    Component Value Date/Time   COLORURINE YELLOW 12/10/2017 1442   APPEARANCEUR Cloudy (A) 12/10/2017 1442   LABSPEC 1.015 12/10/2017 1442   PHURINE 6.5 12/10/2017 1442   GLUCOSEU NEGATIVE 12/10/2017 1442   HGBUR TRACE-INTACT (A) 12/10/2017 1442   BILIRUBINUR NEGATIVE 12/10/2017 1442   BILIRUBINUR negative 02/02/2017 0811   KETONESUR NEGATIVE 12/10/2017 1442   PROTEINUR 1 02/02/2017 0811   PROTEINUR NEG 02/06/2013 1053   UROBILINOGEN 0.2 12/10/2017 1442   NITRITE NEGATIVE 12/10/2017 1442   LEUKOCYTESUR LARGE (A) 12/10/2017 1442     STUDIES: Dg Bone Density  Result Date: 03/03/2018 EXAM: DUAL X-RAY ABSORPTIOMETRY (DXA) FOR BONE MINERAL DENSITY IMPRESSION: STACEY A BLYTH Your patient Amber Morgan completed a BMD test on 03/03/2018 using the Branch (analysis version: 16.SP2) manufactured by [employer]. The following summarizes the results of our evaluation. PATIENT: Name: Keylani, Perlstein Patient ID: [id number] Birth Date: 1927/06/04 Height: 59.0 in. Gender: Female Measured: 03/03/2018 Weight: 117.4 lbs. Indications: Caucasian, Estrogen Deficiency, Height Loss, Hysterectomy, Oophorectomy ( Bilateral), Post Menopausal, Vitamin D Deficiency Fractures: Treatments: Vitamin D ASSESSMENT: The BMD measured at Femur Neck Right is 0.794 g/cm2 with a T-score of -1.8. This patient is considered osteopenic according to [facility]) criteria. Site Region Measured Date Measured Age WHO YA BMD Classification T-score AP Spine L1-L4 03/03/2018 88.8 Normal -0.5 1.122 g/cm2 DualFemur Neck Right 5/03/2018 88.6 [age] Osteopenia -1.8 0.794 g/cm2 World Pharmacologist (  WHO) criteria for post-menopausal, Caucasian Women: Normal       T-score at or above -1 SD Osteopenia   T-score between -1 and  -2.5 SD Osteoporosis T-score at or below -2.5 SD RECOMMENDATION: 1. All patients should optimize calcium and vitamin D intake. 2. Consider FDA-approved medical therapies in postmenopausal women and men aged 65 years and older, based on the following: a. A hip or vertebral(clinical or morphometric) fracture. b. T-Score < -2.5 at the femoral neck or spine after appropriate evaluation to exclude secondary causes c. Low bone mass (T-score between -1.0 and -2.5 at the femoral neck or spine) and a 10 year probability of a hip fracture >3% or a 10 year probability of major osteoporosis-related fracture > 20% based on the [place]-adapted WHO algorithm d. Clinical judgement and/or patient preferences may indicate treatment for people with 10-year fracture probabilities above or below these levels FOLLOW-UP: Patients with diagnosis of osteoporosis or at high risk for fracture should have regular bone mineral density tests. For patients eligible for Medicare, routine testing is allowed once every 2 years. The testing frequency can be increased to one year for patients who have rapidly progressing disease, those who are receiving or discontinuing medical therapy to restore bone mass, or have additional risk factors. I have reviewed this report and agree with the above findings. [facility] Patient: Amber Morgan Referring Physician: Mosie Lukes Birth Date: 1927-03-21 [age] Patient ID: [id number] Height: 59.0 in. Weight: 117.4 lbs. Measured: 03/03/2018 11:01:26 AM (16 SP 2) Gender: Female Ethnicity: White Analyzed: 03/03/2018 11:51:27 AM (16 SP 2) FRAX* 10-year Probability of Fracture Based on femoral neck BMD: DualFemur (Right) Major Osteoporotic Fracture: 12.2% Hip Fracture:                3.9% Population:                  [place] (Caucasian) Risk Factors:                None *FRAX is a Materials engineer of the [employer] for Metabolic Bone Disease, a World Pharmacologist  (WHO) [facility]. ASSESSMENT: The probability of a major osteoporotic fracture is 12.2% within the next ten years. The probability of a hip fracture is 3.9% within the next ten years. Electronically Signed   By: Lowella Grip III M.D.   On: 03/03/2018 12:16   US Breast Ltd Uni Right Inc Axilla  Result Date: 03/14/2018 CLINICAL DATA:  Right breast upper outer quadrant focal asymmetry and right lower breast asymmetry seen on most recent screening mammography. EXAM: DIGITAL DIAGNOSTIC RIGHT MAMMOGRAM WITH CAD AND TOMO ULTRASOUND RIGHT BREAST COMPARISON:  Previous exam(s). ACR Breast Density Category c: The breast tissue is heterogeneously dense, which may obscure small masses. FINDINGS: Additional mammographic views of the right breast demonstrate spiculated subcentimeter mass with associated architectural distortion in the right breast upper inner quadrant, middle to posterior depth. The additional asymmetry seen in the lower right breast effaces to glandular tissue. Mammographic images were processed with CAD. On physical exam, no suspicious masses are palpated. Targeted ultrasound is performed, showing right breast 1 o'clock 5 cm from the nipple irregular hypoechoic taller than wide mass measuring 0.9 x 0.9 x 0.7 cm. This finding corresponds to the mammographically seen spiculated mass. Ultrasound examination of the right axilla demonstrates no evidence of lymphadenopathy. IMPRESSION: Right breast 1 o'clock 9 mm suspicious mass, for which ultrasound-guided core needle biopsy  is recommended. No evidence of right axillary lymphadenopathy. RECOMMENDATION: Ultrasound-guided core needle biopsy of the right breast. I have discussed the findings and recommendations with the patient. Results were also provided in writing at the conclusion of the visit. If applicable, a reminder letter will be sent to the patient regarding the next appointment. BI-RADS CATEGORY  4: Suspicious. Electronically Signed   By:  Fidela Salisbury M.D.   On: 03/14/2018 16:20   Mm Diag Breast Tomo Uni Right  Result Date: 03/14/2018 CLINICAL DATA:  Right breast upper outer quadrant focal asymmetry and right lower breast asymmetry seen on most recent screening mammography. EXAM: DIGITAL DIAGNOSTIC RIGHT MAMMOGRAM WITH CAD AND TOMO ULTRASOUND RIGHT BREAST COMPARISON:  Previous exam(s). ACR Breast Density Category c: The breast tissue is heterogeneously dense, which may obscure small masses. FINDINGS: Additional mammographic views of the right breast demonstrate spiculated subcentimeter mass with associated architectural distortion in the right breast upper inner quadrant, middle to posterior depth. The additional asymmetry seen in the lower right breast effaces to glandular tissue. Mammographic images were processed with CAD. On physical exam, no suspicious masses are palpated. Targeted ultrasound is performed, showing right breast 1 o'clock 5 cm from the nipple irregular hypoechoic taller than wide mass measuring 0.9 x 0.9 x 0.7 cm. This finding corresponds to the mammographically seen spiculated mass. Ultrasound examination of the right axilla demonstrates no evidence of lymphadenopathy. IMPRESSION: Right breast 1 o'clock 9 mm suspicious mass, for which ultrasound-guided core needle biopsy is recommended. No evidence of right axillary lymphadenopathy. RECOMMENDATION: Ultrasound-guided core needle biopsy of the right breast. I have discussed the findings and recommendations with the patient. Results were also provided in writing at the conclusion of the visit. If applicable, a reminder letter will be sent to the patient regarding the next appointment. BI-RADS CATEGORY  4: Suspicious. Electronically Signed   By: Fidela Salisbury M.D.   On: 03/14/2018 16:20   Mm 3d Screen Breast Bilateral  Result Date: 03/03/2018 CLINICAL DATA:  Screening. EXAM: DIGITAL SCREENING BILATERAL MAMMOGRAM WITH TOMO AND CAD COMPARISON:  Previous exam(s).  ACR Breast Density Category c: The breast tissue is heterogeneously dense, which may obscure small masses. FINDINGS: In the right breast, a possible mass with distortion warrants further evaluation. This possible mass with distortion is seen within the upper inner quadrant of the RIGHT breast, at posterior depth, tomosynthesis CC slice 22 and MLO slice 23. Also in the RIGHT breast, there is a possible asymmetry within the inferior RIGHT breast, MLO view only, tomosynthesis slice 20. In the left breast, no findings suspicious for malignancy. Images were processed with CAD. IMPRESSION: Further evaluation is suggested for possible mass with distortion in the RIGHT breast and possible asymmetry within the inferior RIGHT breast. RECOMMENDATION: Diagnostic mammogram and possibly ultrasound of the right breast. (Code:[id number]) The patient will be contacted regarding the findings, and additional imaging will be scheduled. BI-RADS CATEGORY  0: Incomplete. Need additional imaging evaluation and/or prior mammograms for comparison. Electronically Signed   By: Franki Cabot M.D.   On: 03/03/2018 17:02   Mm Clip Placement Right  Result Date: 03/18/2018 CLINICAL DATA:  Status post ultrasound-guided core biopsy of mass in the 1 o'clock location of the RIGHT breast. EXAM: DIAGNOSTIC RIGHT MAMMOGRAM POST ULTRASOUND BIOPSY COMPARISON:  Previous exam(s). FINDINGS: Mammographic images were obtained following ultrasound guided biopsy of mass in the 1 o'clock location of the RIGHT breast. A ribbon shaped clip is identified within the mass in the [place] of the RIGHT  breast. IMPRESSION: Tissue marker clip in the expected location following biopsy. Final Assessment: Post Procedure Mammograms for Marker Placement Electronically Signed   By: Nolon Nations M.D.   On: 03/18/2018 14:43   [place] Rt Breast Bx W Loc Dev 1st Lesion Img Bx Spec US Guide  Addendum Date: 03/21/2018   ADDENDUM REPORT: 03/21/2018 12:24 ADDENDUM:  Pathology revealed GRADE II INVASIVE MAMMARY CARCINOMA of the Right breast, 1 o'clock, (ribbon clip). This was found to be concordant by Dr. Nolon Nations. Pathology results were discussed with the patient by telephone. The patient reported doing well after the biopsy with tenderness at the site. Post biopsy instructions and care were reviewed and questions were answered. The patient was encouraged to call [facility] for any additional concerns. The patient was referred to [facility] at [facility] on March 30, 2018. Pathology results reported by Terie Purser, RN on 03/21/2018. Electronically Signed   By: Nolon Nations M.D.   On: 03/21/2018 12:24   Result Date: 03/21/2018 CLINICAL DATA:  Patient presents for ultrasound-guided core biopsy of mass in the RIGHT breast. EXAM: ULTRASOUND GUIDED RIGHT BREAST CORE NEEDLE BIOPSY COMPARISON:  Previous exam(s). FINDINGS: I met with the patient and we discussed the procedure of ultrasound-guided biopsy, including benefits and alternatives. We discussed the high likelihood of a successful procedure. We discussed the risks of the procedure, including infection, bleeding, tissue injury, clip migration, and inadequate sampling. Informed written consent was given. The usual time-out protocol was performed immediately prior to the procedure. Lesion quadrant: UPPER INNER QUADRANT RIGHT breast Using sterile technique and 1% Lidocaine as local anesthetic, under direct ultrasound visualization, a 12 gauge spring-loaded device was used to perform biopsy of mass in the 1 o'clock location of the RIGHT breast 5 centimeters from the nipple using a inferior to superior approach. At the conclusion of the procedure a ribbon shaped tissue marker clip was deployed into the biopsy cavity. Follow up 2 view mammogram was performed and dictated separately. IMPRESSION: Ultrasound guided biopsy of RIGHT  breast mass. No apparent complications. Electronically Signed: By: Nolon Nations M.D. On: 03/18/2018 14:29    ELIGIBLE FOR AVAILABLE RESEARCH PROTOCOL: no  ASSESSMENT: 84 y.o. Start, [place] woman status post right breast upper inner quadrant biopsy 03/18/2018 for a clinical T1b No, stage IA base of lobular carcinoma, grade 2, estrogen and progesterone receptor positive, HER-2 not amplified, with an MIB-1 of 2%.  (1) breast conserving surgery with no sentinel lymph node sampling  (2) no adjuvant radiation unless positive margins at surgery  (3) adjuvant antiestrogens to follow-up  (a) bone density 03/03/2018 finds a T score of -1.8 (osteopenia).  PLAN: We spent the better part of today's hour-long appointment discussing the biology of her diagnosis and the specifics of her situation. We first reviewed the fact that cancer is not one disease but more than 100 different diseases and that it is important to keep them separate-- otherwise when friends and relatives discuss their own cancer experiences with [place] confusion can result. Similarly we explained that if breast cancer spreads to the bone or liver, the patient would not have bone cancer or liver cancer, but breast cancer in the bone and breast cancer in the liver: one cancer in three places-- not 3 different cancers which otherwise would have to be treated in 3 different ways.  We discussed the difference between local and systemic therapy. In terms of loco-regional treatment, lumpectomy plus radiation  is equivalent to mastectomy as far as survival is concerned. For this reason, and because the cosmetic results are generally superior, we recommend breast conserving surgery.   We then discussed the rationale for systemic therapy. There is some risk that this cancer may have already spread to other parts of her body. Patients frequently ask at this point about bone scans, CAT scans and PET scans to find out if they have occult breast cancer  somewhere else. The problem is that in early stage disease we are much more likely to find false positives then true cancers and this would expose the patient to unnecessary procedures as well as unnecessary radiation. Scans cannot answer the question the patient really would like to know, which is whether she has microscopic disease elsewhere in her body. For those reasons we do not recommend them. I, Lurline Del MD, have reviewed the above documentation for accuracy and completeness, and I agree with the above.  Of course we would proceed to aggressive evaluation of any symptoms that might suggest metastatic disease, but that is not the case here.  Next we went over the options for systemic therapy which are anti-estrogens, anti-HER-2 immunotherapy, and chemotherapy. Marjarie does not meet criteria for anti-HER-2 immunotherapy. She is a good candidate for anti-estrogens.  The question of chemotherapy is more complicated. Chemotherapy is most effective in rapidly growing, aggressive tumors.  Hers is very slow-growing.  In addition neoadjuvant data on lobular breast cancers suggest that they respond much less well to chemotherapy than ductal cancers.  Finally given the patient's age and comorbidities we decided to forego Oncotype testing and forego adjuvant chemotherapy in this case.  Accordingly the plan will be for lumpectomy, with no sentinel lymph node sampling (since neither radiation or chemotherapy is being considered), followed by antiestrogens.  Santana has a good understanding of the overall plan. She agrees with it. She knows the goal of treatment in her case is cure. She will call with any problems that may develop before her next visit here.  Chauncey Cruel, MD   03/30/2018 5:16 PM Medical Oncology and Hematology [facility] [address] [place], [place] [address] Tel. [phone]    Fax. [phone]

## 2018-03-30 ENCOUNTER — Encounter: Payer: Self-pay | Admitting: Radiation Oncology

## 2018-03-30 ENCOUNTER — Ambulatory Visit
Admission: RE | Admit: 2018-03-30 | Discharge: 2018-03-30 | Disposition: A | Discharge status: Home / Self Care | Payer: Medicare HMO | Source: Ambulatory Visit | Attending: Radiation Oncology | Admitting: Radiation Oncology

## 2018-03-30 ENCOUNTER — Inpatient Hospital Stay: Payer: Medicare HMO

## 2018-03-30 ENCOUNTER — Other Ambulatory Visit: Payer: Self-pay | Admitting: General Surgery

## 2018-03-30 ENCOUNTER — Inpatient Hospital Stay: Payer: Medicare HMO | Attending: Oncology | Admitting: Oncology

## 2018-03-30 ENCOUNTER — Ambulatory Visit: Payer: Medicare HMO | Admitting: Physical Therapy

## 2018-03-30 VITALS — BP 165/63 | HR 77 | Temp 97.7°F | Resp 18 | Ht 59.0 in | Wt 115.9 lb

## 2018-03-30 DIAGNOSIS — Z170 Estrogen receptor positive status [ER+]: Principal | ICD-10-CM

## 2018-03-30 DIAGNOSIS — R928 Other abnormal and inconclusive findings on diagnostic imaging of breast: Secondary | ICD-10-CM | POA: Diagnosis not present

## 2018-03-30 DIAGNOSIS — C50211 Malignant neoplasm of upper-inner quadrant of right female breast: Secondary | ICD-10-CM | POA: Diagnosis not present

## 2018-03-30 DIAGNOSIS — K4090 Unilateral inguinal hernia, without obstruction or gangrene, not specified as recurrent: Secondary | ICD-10-CM | POA: Diagnosis not present

## 2018-03-30 DIAGNOSIS — M8580 Other specified disorders of bone density and structure, unspecified site: Secondary | ICD-10-CM | POA: Insufficient documentation

## 2018-03-30 DIAGNOSIS — I10 Essential (primary) hypertension: Secondary | ICD-10-CM | POA: Insufficient documentation

## 2018-03-30 LAB — CBC WITH DIFFERENTIAL (CANCER CENTER ONLY)
BASOS ABS: 0 10*3/uL (ref 0.0–0.1)
Basophils Relative: 0 %
EOS PCT: 1 %
Eosinophils Absolute: 0.1 10*3/uL (ref 0.0–0.5)
HCT: 35.2 % (ref 34.8–46.6)
Hemoglobin: 11.7 g/dL (ref 11.6–15.9)
LYMPHS ABS: 1.4 10*3/uL (ref 0.9–3.3)
Lymphocytes Relative: 24 %
MCH: 31.7 pg (ref 25.1–34.0)
MCHC: 33.2 g/dL (ref 31.5–36.0)
MCV: 95.6 fL (ref 79.5–101.0)
Monocytes Absolute: 0.4 10*3/uL (ref 0.1–0.9)
Monocytes Relative: 8 %
NEUTROS ABS: 3.7 10*3/uL (ref 1.5–6.5)
Neutrophils Relative %: 67 %
PLATELETS: 213 10*3/uL (ref 145–400)
RBC: 3.68 MIL/uL — AB (ref 3.70–5.45)
RDW: 12.9 % (ref 11.2–14.5)
WBC: 5.6 10*3/uL (ref 3.9–10.3)

## 2018-03-30 LAB — CMP (CANCER CENTER ONLY)
ALK PHOS: 54 U/L (ref 38–126)
ALT: 21 U/L (ref 0–44)
AST: 20 U/L (ref 15–41)
Albumin: 3.9 g/dL (ref 3.5–5.0)
Anion gap: 7 (ref 5–15)
BUN: 31 mg/dL — AB (ref 8–23)
CALCIUM: 9.8 mg/dL (ref 8.9–10.3)
CO2: 30 mmol/L (ref 22–32)
CREATININE: 0.97 mg/dL (ref 0.44–1.00)
Chloride: 103 mmol/L (ref 98–111)
GFR, EST AFRICAN AMERICAN: 59 mL/min — AB (ref >60)
GFR, EST NON AFRICAN AMERICAN: 51 mL/min — AB (ref >60)
Glucose, Bld: 96 mg/dL (ref 70–99)
Potassium: 4 mmol/L (ref 3.5–5.1)
Sodium: 140 mmol/L (ref 135–145)
TOTAL PROTEIN: 6.4 g/dL — AB (ref 6.5–8.1)
Total Bilirubin: 0.3 mg/dL (ref 0.3–1.2)

## 2018-03-30 NOTE — Progress Notes (Signed)
Radiation Oncology         [phone] ________________________________  Initial outpatient Consultation  Name: Amber Morgan MRN: [id number]  Date: 03/30/2018  DOB: 12/18/1926  EX:NTZGY, Bonnita Levan, MD  [name], MD   REFERRING PHYSICIAN: [name], MD  DIAGNOSIS:    ICD-10-CM   1. Malignant neoplasm of upper-inner quadrant of right breast in female, estrogen receptor positive ([place]) C50.211    Z17.0   Cancer Staging Malignant neoplasm of upper-inner quadrant of right breast in female, estrogen receptor positive ([place]) Staging form: Breast, AJCC 8th Edition - Clinical stage from 03/30/2018: Stage IA (cT1b, cN0, cM0, G2, ER+, PR+, HER2-) - Unsigned   CHIEF COMPLAINT: Here to discuss management of her right breast cancer  HISTORY OF PRESENT ILLNESS::Amber Morgan is a 84 y.o. female who presented with breast abnormality on the following imaging: routine screening mammogram on the date of 03/03/2018. Ultrasound of breast on 03/14/2018 revealed breast density category C. In the right breast, at the 1 o'clock upper inner quadrant there was a hypoechoic mass measuring 0.9 x 0.9 x 0.7 cm located 5 cm from the nipple. Ultrasonography revealed no evidence of lymphadenopathy in the right axilla.   Biopsy on date of 03/18/2018 showed Invasive lobular carcinoma grade II, spanning 0.6 cm. ER status: 100%; PR status 100%, Her2 status neg.  On review of systems, the patient was 66 at age of first menstrual period. She no longer has periods. She has carried 4 children to term, having her first live birth at 89. She is not currently pregnant and denies used of contraceptives. She reports pain related to hernia with cramping. She endorses glaucoma. She endorses feet swelling and poor circulation. She reports change in stool habits as well as incontinence. She reports a lump in her breast. She reports bruising easily. She endorses arthritis. She endorses anxiety and forgetfulness as  well.  PREVIOUS RADIATION THERAPY: No  PAST MEDICAL HISTORY:  has a past medical history of Allergy, Colon polyps, Diverticulitis, Diverticulosis (01/04/2014), Hernia, inguinal, right, History of chicken pox, HTN (hypertension), Hyperglycemia (09/05/2013), Lipoma of arm (12/09/2014), Nocturia (02/08/2013), Osteopenia (12/09/2014), Personal history of colonic polyps (09/05/2013), Preventative health care (08/14/2016), Unspecified constipation (02/06/2013), Urinary incontinence, and Viral infection characterized by skin and mucous membrane lesions (05/11/2013).    PAST SURGICAL HISTORY: Past Surgical History:  Procedure Laterality Date  . ABDOMINAL HYSTERECTOMY    . BLADDER SURGERY  2003   bladder tact  . EYE SURGERY     multiple bilateral  . RECTOCELE REPAIR Bilateral     FAMILY HISTORY: family history includes Alzheimer's disease in her sister; Arthritis in her son; Cancer in her sister; Cataracts in her son; Colon cancer in her mother and sister; Diabetes in her sister; Glaucoma in her sister, sister, sister, and son; Heart disease in her father and sister; Hyperlipidemia in her sister; Hypertension in her mother, sister, and son; Meniere's disease in her father.  SOCIAL HISTORY:  reports that she has never smoked. She has never used smokeless tobacco. She reports that she does not drink alcohol or use drugs.  ALLERGIES: Patient has no known allergies.  MEDICATIONS:  Current Outpatient Medications  Medication Sig Dispense Refill  . amLODipine (NORVASC) 2.5 MG tablet Take 1 tablet (2.5 mg total) by mouth daily. 90 tablet 1  . Multiple Vitamins-Minerals (ONE-A-DAY WOMENS 50 PLUS PO) Take 1 each by mouth daily.    . polyethylene glycol (MIRALAX / GLYCOLAX) packet Take 17 g by mouth 2 (two) times  daily.    . potassium chloride SA (K-DUR,KLOR-CON) 20 MEQ tablet TAKE 1 TABLET BY MOUTH ONCE DAILY 90 tablet 1  . Probiotic Product (PROBIOTIC DAILY PO) Take by mouth.    . triamterene-hydrochlorothiazide  (MAXZIDE-25) 37.5-25 MG tablet TAKE 1 TABLET EVERY DAY (Patient taking differently: pt states she is taking 1/2 tablet daily) 90 tablet 2  . UNABLE TO FIND Med Name: Equate fiber powder bid     No current facility-administered medications for this encounter.     REVIEW OF SYSTEMS: A 10+ POINT REVIEW OF SYSTEMS WAS OBTAINED including neurology, dermatology, psychiatry, cardiac, respiratory, lymph, extremities, GI, GU, Musculoskeletal, constitutional, breasts, reproductive, HEENT.  All pertinent positives are noted in the HPI.  All others are negative.   PHYSICAL EXAM:  General: Alert and oriented, in no acute distress  ECOG = 2  0 - Asymptomatic (Fully active, able to carry on all predisease activities without restriction)  1 - Symptomatic but completely ambulatory (Restricted in physically strenuous activity but ambulatory and able to carry out work of a light or sedentary nature. For example, light housework, office work)  2 - Symptomatic, <50% in bed during the day (Ambulatory and capable of all self care but unable to carry out any work activities. Up and about more than 50% of waking hours)  3 - Symptomatic, >50% in bed, but not bedbound (Capable of only limited self-care, confined to bed or chair 50% or more of waking hours)  4 - Bedbound (Completely disabled. Cannot carry on any self-care. Totally confined to bed or chair)  5 - Death   Eustace Pen MM, Creech RH, Tormey DC, et al. [id number]). "Toxicity and response criteria of the [facility]". [place] Oncol. 5 (6): 649-55   LABORATORY DATA:  Lab Results  Component Value Date   WBC 5.6 03/30/2018   HGB 11.7 03/30/2018   HCT 35.2 03/30/2018   MCV 95.6 03/30/2018   PLT 213 03/30/2018   CMP     Component Value Date/Time   NA 140 03/30/2018 1214   K 4.0 03/30/2018 1214   CL 103 03/30/2018 1214   CO2 30 03/30/2018 1214   GLUCOSE 96 03/30/2018 1214   BUN 31 (H) 03/30/2018 1214   CREATININE 0.97  03/30/2018 1214   CREATININE 0.79 05/25/2014 0853   CALCIUM 9.8 03/30/2018 1214   PROT 6.4 (L) 03/30/2018 1214   ALBUMIN 3.9 03/30/2018 1214   Morgan 20 03/30/2018 1214   ALT 21 03/30/2018 1214   ALKPHOS 54 03/30/2018 1214   BILITOT 0.3 03/30/2018 1214   GFRNONAA 51 (L) 03/30/2018 1214   GFRAA 59 (L) 03/30/2018 1214         RADIOGRAPHY: Dg Bone Density  Result Date: 03/03/2018 EXAM: DUAL X-RAY ABSORPTIOMETRY (DXA) FOR BONE MINERAL DENSITY IMPRESSION: Amber Morgan Your patient Amber Morgan completed a BMD test on 03/03/2018 using the Power (analysis version: 16.SP2) manufactured by [employer]. The following summarizes the results of our evaluation. PATIENT: Name: Amber Morgan, Amber Morgan Patient ID: [id number] Birth Date: [birth year] Height: 59.0 in. Gender: Female Measured: 03/03/2018 Weight: 117.4 lbs. Indications: Caucasian, Estrogen Deficiency, Height Loss, Hysterectomy, Oophorectomy ( Bilateral), Post Menopausal, Vitamin D Deficiency Fractures: Treatments: Vitamin D ASSESSMENT: The BMD measured at Femur Neck Right is 0.794 g/cm2 with a T-score of -1.8. This patient is considered osteopenic according to [facility]) criteria. Site Region Measured Date Measured Age WHO YA BMD Classification T-score AP Spine L1-L4 03/03/2018 88.6 Normal -0.5 1.122  g/cm2 DualFemur Neck Right 5/03/2018 88.6 years Osteopenia -1.8 0.794 g/cm2 World Health Organization [facility]) criteria for post-menopausal, Caucasian Women: Normal       T-score at or above -1 SD Osteopenia   T-score between -1 and -2.5 SD Osteoporosis T-score at or below -2.5 SD RECOMMENDATION: 1. All patients should optimize calcium and vitamin D intake. 2. Consider FDA-approved medical therapies in postmenopausal women and men aged 87 years and older, based on the following: a. A hip or vertebral(clinical or morphometric) fracture. b. T-Score < -2.5 at the femoral neck or spine after appropriate evaluation to exclude secondary  causes c. Low bone mass (T-score between -1.0 and -2.5 at the femoral neck or spine) and a 10 year probability of a hip fracture >3% or a 10 year probability of major osteoporosis-related fracture > 20% based on the [place]-adapted WHO algorithm d. Clinical judgement and/or patient preferences may indicate treatment for people with 10-year fracture probabilities above or below these levels FOLLOW-UP: Patients with diagnosis of osteoporosis or at high risk for fracture should have regular bone mineral density tests. For patients eligible for Medicare, routine testing is allowed once every 2 years. The testing frequency can be increased to one year for patients who have rapidly progressing disease, those who are receiving or discontinuing medical therapy to restore bone mass, or have additional risk factors. I have reviewed this report and agree with the above findings. [facility] Patient: Amber Morgan Referring Physician: Mosie Lukes Birth Date: 10/16/1926 [age] Patient ID: [id number] Height: 59.0 in. Weight: 117.4 lbs. Measured: 03/03/2018 11:01:26 AM (16 SP 2) Gender: Female Ethnicity: White Analyzed: 03/03/2018 11:51:27 AM (16 SP 2) FRAX* 10-year Probability of Fracture Based on femoral neck BMD: DualFemur (Right) Major Osteoporotic Fracture: 12.2% Hip Fracture:                3.9% Population:                  [place] (Caucasian) Risk Factors:                None *FRAX is a Materials engineer of the [employer] for Metabolic Bone Disease, a World Pharmacologist (WHO) [facility]. ASSESSMENT: The probability of a major osteoporotic fracture is 12.2% within the next ten years. The probability of a hip fracture is 3.9% within the next ten years. Electronically Signed   By: Lowella Grip III M.D.   On: 03/03/2018 12:16   US Breast Ltd Uni Right Inc Axilla  Result Date: 03/14/2018 CLINICAL DATA:  Right breast upper outer quadrant focal asymmetry and  right lower breast asymmetry seen on most recent screening mammography. EXAM: DIGITAL DIAGNOSTIC RIGHT MAMMOGRAM WITH CAD AND TOMO ULTRASOUND RIGHT BREAST COMPARISON:  Previous exam(s). ACR Breast Density Category c: The breast tissue is heterogeneously dense, which may obscure small masses. FINDINGS: Additional mammographic views of the right breast demonstrate spiculated subcentimeter mass with associated architectural distortion in the right breast upper inner quadrant, middle to posterior depth. The additional asymmetry seen in the lower right breast effaces to glandular tissue. Mammographic images were processed with CAD. On physical exam, no suspicious masses are palpated. Targeted ultrasound is performed, showing right breast 1 o'clock 5 cm from the nipple irregular hypoechoic taller than wide mass measuring 0.9 x 0.9 x 0.7 cm. This finding corresponds to the mammographically seen spiculated mass. Ultrasound examination of the right axilla demonstrates no evidence of lymphadenopathy. IMPRESSION:  Right breast 1 o'clock 9 mm suspicious mass, for which ultrasound-guided core needle biopsy is recommended. No evidence of right axillary lymphadenopathy. RECOMMENDATION: Ultrasound-guided core needle biopsy of the right breast. I have discussed the findings and recommendations with the patient. Results were also provided in writing at the conclusion of the visit. If applicable, a reminder letter will be sent to the patient regarding the next appointment. BI-RADS CATEGORY  4: Suspicious. Electronically Signed   By: Fidela Salisbury M.D.   On: 03/14/2018 16:20   Mm Diag Breast Tomo Uni Right  Result Date: 03/14/2018 CLINICAL DATA:  Right breast upper outer quadrant focal asymmetry and right lower breast asymmetry seen on most recent screening mammography. EXAM: DIGITAL DIAGNOSTIC RIGHT MAMMOGRAM WITH CAD AND TOMO ULTRASOUND RIGHT BREAST COMPARISON:  Previous exam(s). ACR Breast Density Category c: The breast  tissue is heterogeneously dense, which may obscure small masses. FINDINGS: Additional mammographic views of the right breast demonstrate spiculated subcentimeter mass with associated architectural distortion in the right breast upper inner quadrant, middle to posterior depth. The additional asymmetry seen in the lower right breast effaces to glandular tissue. Mammographic images were processed with CAD. On physical exam, no suspicious masses are palpated. Targeted ultrasound is performed, showing right breast 1 o'clock 5 cm from the nipple irregular hypoechoic taller than wide mass measuring 0.9 x 0.9 x 0.7 cm. This finding corresponds to the mammographically seen spiculated mass. Ultrasound examination of the right axilla demonstrates no evidence of lymphadenopathy. IMPRESSION: Right breast 1 o'clock 9 mm suspicious mass, for which ultrasound-guided core needle biopsy is recommended. No evidence of right axillary lymphadenopathy. RECOMMENDATION: Ultrasound-guided core needle biopsy of the right breast. I have discussed the findings and recommendations with the patient. Results were also provided in writing at the conclusion of the visit. If applicable, a reminder letter will be sent to the patient regarding the next appointment. BI-RADS CATEGORY  4: Suspicious. Electronically Signed   By: Fidela Salisbury M.D.   On: 03/14/2018 16:20   Mm 3d Screen Breast Bilateral  Result Date: 03/03/2018 CLINICAL DATA:  Screening. EXAM: DIGITAL SCREENING BILATERAL MAMMOGRAM WITH TOMO AND CAD COMPARISON:  Previous exam(s). ACR Breast Density Category c: The breast tissue is heterogeneously dense, which may obscure small masses. FINDINGS: In the right breast, a possible mass with distortion warrants further evaluation. This possible mass with distortion is seen within the upper inner quadrant of the RIGHT breast, at posterior depth, tomosynthesis CC slice 22 and MLO slice 23. Also in the RIGHT breast, there is a possible  asymmetry within the inferior RIGHT breast, MLO view only, tomosynthesis slice 20. In the left breast, no findings suspicious for malignancy. Images were processed with CAD. IMPRESSION: Further evaluation is suggested for possible mass with distortion in the RIGHT breast and possible asymmetry within the inferior RIGHT breast. RECOMMENDATION: Diagnostic mammogram and possibly ultrasound of the right breast. (Code:[id number]-45M) The patient will be contacted regarding the findings, and additional imaging will be scheduled. BI-RADS CATEGORY  0: Incomplete. Need additional imaging evaluation and/or prior mammograms for comparison. Electronically Signed   By: Franki Cabot M.D.   On: 03/03/2018 17:02   Mm Clip Placement Right  Result Date: 03/18/2018 CLINICAL DATA:  Status post ultrasound-guided core biopsy of mass in the 1 o'clock location of the RIGHT breast. EXAM: DIAGNOSTIC RIGHT MAMMOGRAM POST ULTRASOUND BIOPSY COMPARISON:  Previous exam(s). FINDINGS: Mammographic images were obtained following ultrasound guided biopsy of mass in the 1 o'clock location of the RIGHT breast. A ribbon shaped  clip is identified within the mass in the [place] of the RIGHT breast. IMPRESSION: Tissue marker clip in the expected location following biopsy. Final Assessment: Post Procedure Mammograms for Marker Placement Electronically Signed   By: Nolon Nations M.D.   On: 03/18/2018 14:43   [place] Rt Breast Bx W Loc Dev 1st Lesion Img Bx Spec US Guide  Addendum Date: 03/21/2018   ADDENDUM REPORT: 03/21/2018 12:24 ADDENDUM: Pathology revealed GRADE II INVASIVE MAMMARY CARCINOMA of the Right breast, 1 o'clock, (ribbon clip). This was found to be concordant by Dr. Nolon Nations. Pathology results were discussed with the patient by telephone. The patient reported doing well after the biopsy with tenderness at the site. Post biopsy instructions and care were reviewed and questions were answered. The patient was encouraged to  call [facility] for any additional concerns. The patient was referred to [facility] on March 30, 2018. Pathology results reported by Terie Purser, RN on 03/21/2018. Electronically Signed   By: Nolon Nations M.D.   On: 03/21/2018 12:24   Result Date: 03/21/2018 CLINICAL DATA:  Patient presents for ultrasound-guided core biopsy of mass in the RIGHT breast. EXAM: ULTRASOUND GUIDED RIGHT BREAST CORE NEEDLE BIOPSY COMPARISON:  Previous exam(s). FINDINGS: I met with the patient and we discussed the procedure of ultrasound-guided biopsy, including benefits and alternatives. We discussed the high likelihood of a successful procedure. We discussed the risks of the procedure, including infection, bleeding, tissue injury, clip migration, and inadequate sampling. Informed written consent was given. The usual time-out protocol was performed immediately prior to the procedure. Lesion quadrant: UPPER INNER QUADRANT RIGHT breast Using sterile technique and 1% Lidocaine as local anesthetic, under direct ultrasound visualization, a 12 gauge spring-loaded device was used to perform biopsy of mass in the 1 o'clock location of the RIGHT breast 5 centimeters from the nipple using a inferior to superior approach. At the conclusion of the procedure a ribbon shaped tissue marker clip was deployed into the biopsy cavity. Follow up 2 view mammogram was performed and dictated separately. IMPRESSION: Ultrasound guided biopsy of RIGHT breast mass. No apparent complications. Electronically Signed: By: Nolon Nations M.D. On: 03/18/2018 14:29      IMPRESSION/PLAN:  Delightful 84 yo woman with early stage ER+ breast cancer.  Plan for lumpectomy, and anti-estrogen therapy.  Unless her pathology is shows a significantly more high risk cancer, I do not think the benefits of RT would outweigh the risks.  At this time, I do not  anticipate adjuvant RT.  She is pleased with this plan. I wished her the best. All questions were answered to her family's, and her satisfaction.  __________________________________________   Eppie Gibson, MD   This document serves as a record of services personally performed by Eppie Gibson, MD. It was created on his behalf by Linward Natal, a trained medical scribe. The creation of this record is based on the scribe's personal observations and the provider's statements to them. This document has been checked and approved by the attending provider.

## 2018-03-31 ENCOUNTER — Telehealth: Payer: Self-pay | Admitting: Oncology

## 2018-03-31 ENCOUNTER — Encounter: Payer: Self-pay | Admitting: General Practice

## 2018-03-31 NOTE — Progress Notes (Signed)
[place] Psychosocial Distress Screening Spiritual Care  Met with Amber Morgan and family (sons Fritz [place] and [place], [place], granddaughter April) in [facility] to introduce [place] team/resources, reviewing distress screen per protocol.  The patient scored a 10 on the Psychosocial Distress Thermometer which indicates severe distress. Also assessed for distress and other psychosocial needs.   ONCBCN DISTRESS SCREENING 03/31/2018  Screening Type Initial Screening  Distress experienced in past week (1-10) 10  Family Problem type Partner  Emotional problem type Nervousness/Anxiety;Adjusting to illness  Spiritual/Religous concerns type Facing my mortality  Information Concerns Type Lack of info about diagnosis;Lack of info about treatment  Physical Problem type Pain;Constipation/diarrhea;Swollen arms/legs  Referral to support programs Yes    [name] was her husband's primary caregiver for many years, and he died on 12/01/22 29-Mar-2018. With intermittent tears she describes herself as numb and overwhelmed. Normalized feelings and need for bereavement support. Also addressed deferred grief as a possibility when there are many concurrent major life changes. Talked with family about supporting one another through grief and pt's tx, including review of [place] team/resources.  Follow up needed: Yes.  Plan to f/u with Shaunita by phone next week, but please also page if immediate needs arise or circumstances change. Thank you.   [place], [place], [facility] Pager [phone] Voicemail [phone]

## 2018-03-31 NOTE — Telephone Encounter (Signed)
No 6/26 los or referals.

## 2018-04-01 ENCOUNTER — Other Ambulatory Visit: Payer: Self-pay | Admitting: General Surgery

## 2018-04-01 DIAGNOSIS — Z170 Estrogen receptor positive status [ER+]: Principal | ICD-10-CM

## 2018-04-01 DIAGNOSIS — C50211 Malignant neoplasm of upper-inner quadrant of right female breast: Secondary | ICD-10-CM

## 2018-04-04 ENCOUNTER — Encounter (HOSPITAL_COMMUNITY)
Admission: RE | Admit: 2018-04-04 | Discharge: 2018-04-04 | Disposition: A | Discharge status: Home / Self Care | Payer: Medicare HMO | Source: Ambulatory Visit | Attending: General Surgery | Admitting: General Surgery

## 2018-04-04 ENCOUNTER — Encounter (HOSPITAL_COMMUNITY): Payer: Self-pay

## 2018-04-04 ENCOUNTER — Other Ambulatory Visit: Payer: Self-pay

## 2018-04-04 DIAGNOSIS — H524 Presbyopia: Secondary | ICD-10-CM | POA: Diagnosis not present

## 2018-04-04 DIAGNOSIS — Z01812 Encounter for preprocedural laboratory examination: Secondary | ICD-10-CM | POA: Diagnosis not present

## 2018-04-04 DIAGNOSIS — Z01810 Encounter for preprocedural cardiovascular examination: Secondary | ICD-10-CM | POA: Insufficient documentation

## 2018-04-04 DIAGNOSIS — Z961 Presence of intraocular lens: Secondary | ICD-10-CM | POA: Diagnosis not present

## 2018-04-04 DIAGNOSIS — H44522 Atrophy of globe, left eye: Secondary | ICD-10-CM | POA: Diagnosis not present

## 2018-04-04 DIAGNOSIS — H401113 Primary open-angle glaucoma, right eye, severe stage: Secondary | ICD-10-CM | POA: Diagnosis not present

## 2018-04-04 DIAGNOSIS — H18422 Band keratopathy, left eye: Secondary | ICD-10-CM | POA: Diagnosis not present

## 2018-04-04 HISTORY — PX: BREAST LUMPECTOMY: SHX2

## 2018-04-04 HISTORY — DX: Unspecified osteoarthritis, unspecified site: M19.90

## 2018-04-04 HISTORY — DX: Personal history of other diseases of the digestive system: Z87.19

## 2018-04-04 HISTORY — DX: Prediabetes: R73.03

## 2018-04-04 HISTORY — DX: Unspecified glaucoma: H40.9

## 2018-04-04 HISTORY — DX: Presence of spectacles and contact lenses: Z97.3

## 2018-04-04 HISTORY — DX: Malignant (primary) neoplasm, unspecified: C80.1

## 2018-04-04 LAB — HEMOGLOBIN A1C
Hgb A1c MFr Bld: 6.1 % — ABNORMAL HIGH (ref 4.8–5.6)
MEAN PLASMA GLUCOSE: 128.37 mg/dL

## 2018-04-04 LAB — GLUCOSE, CAPILLARY: Glucose-Capillary: 83 mg/dL (ref 70–99)

## 2018-04-04 NOTE — Pre-Procedure Instructions (Signed)
   MONTIE GELARDI  04/04/2018     [facility] - [place], [place] - [address] [place] [address] Phone: [phone] Fax: [phone]   Your procedure is scheduled on Tuesday, April 12, 2018  Report to [facility] Admitting at 8:30 A.M.  Call this number if you have problems the morning of surgery:  [phone]   Remember: Drink your Ensure Pre- Surgery Carbohydrate drink by 7:30A.M. the morning of surgery.  Do not eat or drink after midnight Monday, April 11, 2018  Take these medicines the morning of surgery with A SIP OF WATER : amLODipine (NORVASC),  Eye drops,  if needed: acetaminophen (TYLENOL) for pain  Stop taking Aspirin (unless advised otherwise by your surgeon), vitamins, fish oil, Probiotics and herbal medications. Do not take any NSAIDs ie: Ibuprofen, Advil, Naproxen (Aleve) , Motrin, BC and Goody Powder; stop Tuesday, April 05, 2018.   Do not wear jewelry, make-up or nail polish.  Do not wear lotions, powders, or perfumes, or deodorant.  Do not shave 48 hours prior to surgery.    Do not bring valuables to the hospital.  [facility] is not responsible for any belongings or valuables.  Contacts, dentures or bridgework may not be worn into surgery.   Patients discharged the day of surgery will not be allowed to drive home.  Special instructions:Shower the night before and morning of surgery with CHG. Please read over the following fact sheets that you were given. Pain Booklet, Coughing and Deep Breathing and Surgical Site Infection Prevention

## 2018-04-04 NOTE — Progress Notes (Signed)
Pt accompanied to PAT appointment with son Fritz Pickerel. Pt denies any acute cardiopulmonary issues. Pt stated that she is under the care of Dr. Stanford Breed " for high BP."  Pt denies having an echo and cardiac cath but stated that a stress test was performed 6-7 years ago. Pt denies having an EKG and chest x ray within the last year. Pt denies having an A1c within the last 60 days.

## 2018-04-06 ENCOUNTER — Encounter: Payer: Self-pay | Admitting: General Practice

## 2018-04-06 ENCOUNTER — Telehealth: Payer: Self-pay | Admitting: *Deleted

## 2018-04-06 NOTE — Telephone Encounter (Signed)
  Oncology Nurse Navigator Documentation  Navigator Location: [facility] (04/06/18 1600)   )Navigator Encounter Type: Telephone;[facility] Follow-up (04/06/18 1600) Telephone: Outgoing Call;Clinic/[facility] Follow-up (04/06/18 1600)     Surgery Date: 04/12/18 (04/06/18 1600)           Treatment Initiated Date: 04/12/18 (04/06/18 1600)                                Time Spent with Patient: 15 (04/06/18 1600)

## 2018-04-06 NOTE — Progress Notes (Signed)
[place] Spiritual Care Note  LVM of support and encouragement, also sending handwritten sympathy note, as Amber Morgan is dealing with new breast ca dx on top of the very recent death of her husband. Enclosed my card and encouraged her to call anytime, but also plan to call later in month to offer Spiritual Care or other support resources again.   [place], [place], [facility] Pager [phone] Voicemail [phone]

## 2018-04-11 ENCOUNTER — Ambulatory Visit
Admission: RE | Admit: 2018-04-11 | Discharge: 2018-04-11 | Disposition: A | Discharge status: Home / Self Care | Payer: Medicare HMO | Source: Ambulatory Visit | Attending: General Surgery | Admitting: General Surgery

## 2018-04-11 DIAGNOSIS — C50211 Malignant neoplasm of upper-inner quadrant of right female breast: Secondary | ICD-10-CM

## 2018-04-11 DIAGNOSIS — Z170 Estrogen receptor positive status [ER+]: Principal | ICD-10-CM

## 2018-04-11 NOTE — H&P (Signed)
Amber Morgan Documented: 03/30/2018 6:56 AM Location: [facility] Patient #: [id number] DOB: [birth year] Married / Language: [name] / Race: White Female   History of Present Illness Amber Klein MD; 03/30/2018 4:01 PM) The patient is a 84 year old female who presents with breast cancer. Pt is an 84 yo F who presents with a screening detected right breast cancer. She had a mass detected with distortion at 12:30 on the right. Dx imaging showed the abnormality to be around 9 mm. Core needle biopsy was performed and showed a grade 2 invasive mammary carcinoma, lobular phenotype, ER/PR +, Her 2 neu neg. Ki67 2%.   She has no family history of breast cancer, but had a mom an sister with colon cancer. She is up to date with colonoscopy. She also has had a bone density recently. She is no longer getting PAP smears as she is s/p hysterectomy/rectocele, bladder sling.   She complains of pain sitting down since rectocele repair and complains of a RIH. This bulges when she stands and is sore. She had a recent CT demonstrating fat in a RIH.   dx mammogram 03/14/2018 ACR Breast Density Category c: The breast tissue is heterogeneously dense, which may obscure small masses.  FINDINGS: Additional mammographic views of the right breast demonstrate spiculated subcentimeter mass with associated architectural distortion in the right breast upper inner quadrant, middle to posterior depth. The additional asymmetry seen in the lower right breast effaces to glandular tissue.  Mammographic images were processed with CAD.  On physical exam, no suspicious masses are palpated.  Targeted ultrasound is performed, showing right breast 1 o'clock 5 cm from the nipple irregular hypoechoic taller than wide mass measuring 0.9 x 0.9 x 0.7 cm. This finding corresponds to the mammographically seen spiculated mass. Ultrasound examination of the right axilla demonstrates no evidence of  lymphadenopathy.  IMPRESSION: Right breast 1 o'clock 9 mm suspicious mass, for which ultrasound-guided core needle biopsy is recommended.  No evidence of right axillary lymphadenopathy.  RECOMMENDATION: Ultrasound-guided core needle biopsy of the right breast.  I have discussed the findings and recommendations with the patient. Results were also provided in writing at the conclusion of the visit. If applicable, a reminder letter will be sent to the patient regarding the next appointment.  BI-RADS CATEGORY 4: Suspicious.  pathology 03/18/2018 Breast, right, needle core biopsy, 1 o'clock, ribbon clip - INVASIVE MAMMARY CARCINOMA - SEE COMMENT Microscopic Comment The biopsy material shows an infiltrative proliferation of cells with vesicular nuclei with inconspicuous nucleoli, arranged linearly and in small clusters. Based on the biopsy, the carcinoma appears Nottingham grade 2 of 3 and measures 0.6 cm in greatest linear extent. E-cadherin is negative supporting lobular origin Estrogen Receptor: 100%, POSITIVE, STRONG STAINING INTENSITY Progesterone Receptor: 100%, POSITIVE, STRONG STAINING INTENSITY Proliferation Marker Ki67: 2% HER2 - NEGATIVE  CMET and CBC essentially normal.    Past Surgical History Amber Klein, MD; 03/30/2018 7:00 AM) Cataract Surgery  Bilateral. Colon Polyp Removal - Colonoscopy  Hysterectomy (not due to cancer) - Complete   Diagnostic Studies History Amber Klein, MD; 03/30/2018 7:00 AM) Mammogram  within last year Pap Smear  >5 years ago  Social History Amber Klein, MD; 03/30/2018 7:00 AM) Caffeine use  Coffee. No alcohol use  No drug use  Tobacco use  Never smoker.  Family History Amber Klein, MD; 03/30/2018 7:00 AM) Colon Cancer  Mother. Heart Disease  Father. Hypertension  Brother, Father, Mother, Sister, Son. Thyroid problems  Sister.  Pregnancy / Birth  History Amber Klein, MD; 03/30/2018 7:00 AM) Age at menarche   13 years. Age of menopause  64-55 Maternal age  16-20 Para  34  Other Problems Amber Klein, MD; 03/30/2018 7:00 AM) Diverticulosis  Hemorrhoids  High blood pressure  Oophorectomy  Bilateral. Other disease, cancer, significant illness  Vascular Disease     Review of Systems Amber Klein MD; 03/30/2018 7:00 AM) General Not Present- Appetite Loss, Chills, Fatigue, Fever, Night Sweats, Weight Gain and Weight Loss. Skin Not Present- Change in Wart/Mole, Dryness, Morgan, Jaundice, New Lesions, Non-Healing Wounds, Rash and Ulcer. HEENT Present- Visual Disturbances and Wears glasses/contact lenses. Not Present- Earache, Hearing Loss, Hoarseness, Nose Bleed, Oral Ulcers, Ringing in the Ears, Seasonal Allergies, Sinus Pain, Sore Throat and Yellow Eyes. Respiratory Not Present- Bloody sputum, Chronic Cough, Difficulty Breathing, Snoring and Wheezing. Breast Not Present- Breast Mass, Breast Pain, Nipple Discharge and Skin Changes. Cardiovascular Present- Leg Cramps and Swelling of Extremities. Not Present- Chest Pain, Difficulty Breathing Lying Down, Palpitations, Rapid Heart Rate and Shortness of Breath. Gastrointestinal Present- Constipation. Not Present- Abdominal Pain, Bloating, Bloody Stool, Change in Bowel Habits, Chronic diarrhea, Difficulty Swallowing, Excessive gas, Gets full quickly at meals, Hemorrhoids, Indigestion, Nausea, Rectal Pain and Vomiting. Female Genitourinary Present- Frequency, Nocturia and Urgency. Not Present- Painful Urination and Pelvic Pain. Musculoskeletal Present- Swelling of Extremities. Not Present- Back Pain, Joint Pain, Joint Stiffness, Muscle Pain and Muscle Weakness. Neurological Not Present- Decreased Memory, Fainting, Headaches, Numbness, Seizures, Tingling, Tremor, Trouble walking and Weakness. Psychiatric Not Present- Anxiety, Bipolar, Change in Sleep Pattern, Depression, Fearful and Frequent crying. Endocrine Not Present- Cold Intolerance, Excessive  Hunger, Hair Changes, Heat Intolerance, Hot flashes and New Diabetes. Hematology Present- Easy Bruising. Not Present- Excessive bleeding, Gland problems, HIV and Persistent Infections.  Vitals Amber Klein MD; 03/30/2018 3:57 PM) 03/30/2018 3:57 PM Weight: 115.9 lb Height: 59in Body Surface Area: 1.46 m Body Mass Index: 23.41 kg/m  Temp.: 97.14F  Pulse: 77 (Regular)  Resp.: 18 (Unlabored)  BP: 165/63 (Sitting, Left Arm, Standard)       Physical Exam Amber Klein MD; 03/30/2018 3:56 PM) General Mental Status-Alert. General Appearance-Consistent with stated age. Hydration-Well hydrated. Voice-Normal.  Head and Neck Head-normocephalic, atraumatic with no lesions or palpable masses. Trachea-midline. Thyroid Gland Characteristics - normal size and consistency.  Eye Eyeball - Bilateral-Extraocular movements intact. Sclera/Conjunctiva - Bilateral-No scleral icterus.  Chest and Lung Exam Chest and lung exam reveals -quiet, even and easy respiratory effort with no use of accessory muscles and on auscultation, normal breath sounds, no adventitious sounds and normal vocal resonance. Inspection Chest Wall - Normal. Back - normal.  Breast Note: breasts symmetric bilaterally. ptotic. no palpable masses. some faint hematoma on UIQ right breast at bx site. no nipple retraction or skin dimpling. no LAD. no discharge.   Cardiovascular Cardiovascular examination reveals -normal heart sounds, regular rate and rhythm with no murmurs and normal pedal pulses bilaterally.  Abdomen Inspection Inspection of the abdomen reveals - No Hernias. Palpation/Percussion Palpation and Percussion of the abdomen reveal - Soft, Non Tender, No Rebound tenderness, No Rigidity (guarding) and No hepatosplenomegaly. Auscultation Auscultation of the abdomen reveals - Bowel sounds normal.  Female Genitourinary Note: RIH just below hysterectomy  scar.   Neurologic Neurologic evaluation reveals -alert and oriented x 3 with no impairment of recent or remote memory. Mental Status-Normal.  Musculoskeletal Global Assessment -Note: no gross deformities.  Normal Exam - Left-Upper Extremity Strength Normal and Lower Extremity Strength Normal. Normal Exam - Right-Upper Extremity Strength Normal and Lower  Extremity Strength Normal.  Lymphatic Head & Neck  General Head & Neck Lymphatics: Bilateral - Description - Normal. Axillary  General Axillary Region: Bilateral - Description - Normal. Tenderness - Non Tender. Femoral & Inguinal  Generalized Femoral & Inguinal Lymphatics: Bilateral - Description - No Generalized lymphadenopathy.    Assessment & Plan Amber Klein MD; 03/30/2018 4:00 PM) MALIGNANT NEOPLASM OF UPPER-INNER QUADRANT OF RIGHT BREAST IN FEMALE, ESTROGEN RECEPTOR POSITIVE (C50.211) Impression: Pt has a new dx of a cT1cN0 right breast cancer. Although lobular, pt has small tumor and does not have very dense breasts. Will forego MRI as there is no survival advantage. Discussed pros and cons of that. Will plan seed localized lumpectomy. Will not do SLN bx as no change in treatment would be provided. If we are surprised by size of tumor, we can get SLN at second surgery. Pt is very active and this may affect her quality of life for no benefit.  The surgical procedure was described to the patient. I discussed the incision type and location and that we would need radiology involved on with a wire or seed marker and/or sentinel node.  The risks and benefits of the procedure were described to the patient and she wishes to proceed.  We discussed the risks bleeding, infection, damage to other structures, need for further procedures/surgeries. We discussed the risk of seroma. The patient was advised if the area in the breast in cancer, we may need to go back to surgery for additional tissue to obtain negative margins or  for a lymph node biopsy. The patient was advised that these are the most common complications, but that others can occur as well. They were advised against taking aspirin or other anti-inflammatory agents/blood thinners the week before surgery. Current Plans Pt Education - [facility] Breast Cancer Information Given - [facility] "Breast Journey" Package Pt Education - flb breast cancer surgery: discussed with patient and provided information. You are being scheduled for surgery- Our schedulers will call you.  You should hear from our office's scheduling department within 5 working days about the location, date, and time of surgery. We try to make accommodations for patient's preferences in scheduling surgery, but sometimes the OR schedule or the surgeon's schedule prevents [place] from making those accommodations.  If you have not heard from our office [phone]) in 5 working days, call the office and ask for your surgeon's nurse.  If you have other questions about your diagnosis, plan, or surgery, call the office and ask for your surgeon's nurse.  REDUCIBLE RIGHT INGUINAL HERNIA (K40.90) Impression: Will plan open RIH repair with mesh.  Discussed risks of srugery including bleeding, swelling, pain, recurrence. Other risks as defined above.    Signed by Amber Klein, MD (03/30/2018 4:02 PM)

## 2018-04-12 ENCOUNTER — Ambulatory Visit
Admission: RE | Admit: 2018-04-12 | Discharge: 2018-04-12 | Disposition: A | Discharge status: Home / Self Care | Payer: Medicare HMO | Source: Ambulatory Visit | Attending: General Surgery | Admitting: General Surgery

## 2018-04-12 ENCOUNTER — Ambulatory Visit (HOSPITAL_COMMUNITY): Payer: Medicare HMO | Admitting: Anesthesiology

## 2018-04-12 ENCOUNTER — Ambulatory Visit (HOSPITAL_COMMUNITY)
Admission: RE | Admit: 2018-04-12 | Discharge: 2018-04-12 | Disposition: A | Discharge status: Home / Self Care | Payer: Medicare HMO | Source: Ambulatory Visit | Attending: General Surgery | Admitting: General Surgery

## 2018-04-12 ENCOUNTER — Encounter (HOSPITAL_COMMUNITY): Payer: Self-pay | Admitting: Anesthesiology

## 2018-04-12 ENCOUNTER — Encounter (HOSPITAL_COMMUNITY)
Admission: RE | Disposition: A | Discharge status: Home / Self Care | Payer: Self-pay | Source: Ambulatory Visit | Attending: General Surgery

## 2018-04-12 DIAGNOSIS — K4090 Unilateral inguinal hernia, without obstruction or gangrene, not specified as recurrent: Secondary | ICD-10-CM | POA: Diagnosis not present

## 2018-04-12 DIAGNOSIS — G8918 Other acute postprocedural pain: Secondary | ICD-10-CM | POA: Diagnosis not present

## 2018-04-12 DIAGNOSIS — C50911 Malignant neoplasm of unspecified site of right female breast: Secondary | ICD-10-CM | POA: Diagnosis not present

## 2018-04-12 DIAGNOSIS — C50211 Malignant neoplasm of upper-inner quadrant of right female breast: Secondary | ICD-10-CM | POA: Diagnosis not present

## 2018-04-12 DIAGNOSIS — Z79899 Other long term (current) drug therapy: Secondary | ICD-10-CM | POA: Diagnosis not present

## 2018-04-12 DIAGNOSIS — Z170 Estrogen receptor positive status [ER+]: Principal | ICD-10-CM

## 2018-04-12 DIAGNOSIS — R928 Other abnormal and inconclusive findings on diagnostic imaging of breast: Secondary | ICD-10-CM | POA: Diagnosis not present

## 2018-04-12 DIAGNOSIS — I10 Essential (primary) hypertension: Secondary | ICD-10-CM | POA: Insufficient documentation

## 2018-04-12 HISTORY — PX: INGUINAL HERNIA REPAIR: SHX194

## 2018-04-12 HISTORY — PX: BREAST LUMPECTOMY WITH RADIOACTIVE SEED LOCALIZATION: SHX6424

## 2018-04-12 LAB — GLUCOSE, CAPILLARY: Glucose-Capillary: 162 mg/dL — ABNORMAL HIGH (ref 70–99)

## 2018-04-12 SURGERY — BREAST LUMPECTOMY WITH RADIOACTIVE SEED LOCALIZATION
Anesthesia: General | Site: Inguinal | Laterality: Right

## 2018-04-12 MED ORDER — BUPIVACAINE-EPINEPHRINE (PF) 0.25% -1:200000 IJ SOLN
INTRAMUSCULAR | Status: AC
  Filled 2018-04-12: qty 60

## 2018-04-12 MED ORDER — GABAPENTIN 300 MG PO CAPS
300.0000 mg | ORAL_CAPSULE | ORAL | Status: AC
  Administered 2018-04-12: 300 mg via ORAL
  Filled 2018-04-12: qty 1

## 2018-04-12 MED ORDER — SUGAMMADEX SODIUM 200 MG/2ML IV SOLN
INTRAVENOUS | Status: DC | PRN
  Administered 2018-04-12 (×3): 50 mg via INTRAVENOUS

## 2018-04-12 MED ORDER — BUPIVACAINE LIPOSOME 1.3 % IJ SUSP
20.0000 mL | INTRAMUSCULAR | Status: DC
Start: 2018-04-12 — End: 2018-04-12
  Filled 2018-04-12: qty 20

## 2018-04-12 MED ORDER — MIDAZOLAM HCL 2 MG/2ML IJ SOLN
INTRAMUSCULAR | Status: AC
  Filled 2018-04-12: qty 2

## 2018-04-12 MED ORDER — ACETAMINOPHEN 500 MG PO TABS: 500.0000 mg | ORAL_TABLET | Freq: Three times a day (TID) | ORAL | 0 refills | Status: DC | PRN

## 2018-04-12 MED ORDER — PROPOFOL 10 MG/ML IV BOLUS
INTRAVENOUS | Status: DC | PRN
  Administered 2018-04-12: 120 ug via INTRAVENOUS

## 2018-04-12 MED ORDER — BUPIVACAINE LIPOSOME 1.3 % IJ SUSP
INTRAMUSCULAR | Status: DC | PRN
  Administered 2018-04-12: 10 mL

## 2018-04-12 MED ORDER — ONDANSETRON HCL 4 MG/2ML IJ SOLN: 4.0000 mg | Freq: Once | INTRAMUSCULAR | Status: DC | PRN

## 2018-04-12 MED ORDER — FENTANYL CITRATE (PF) 100 MCG/2ML IJ SOLN: 25.0000 ug | INTRAMUSCULAR | Status: DC | PRN

## 2018-04-12 MED ORDER — LIDOCAINE HCL 1 % IJ SOLN
INTRAMUSCULAR | Status: AC
  Filled 2018-04-12: qty 20

## 2018-04-12 MED ORDER — OXYCODONE HCL 5 MG PO TABS: 2.5000 mg | ORAL_TABLET | Freq: Four times a day (QID) | ORAL | 0 refills | Status: DC | PRN

## 2018-04-12 MED ORDER — CHLORHEXIDINE GLUCONATE CLOTH 2 % EX PADS: 6.0000 | MEDICATED_PAD | Freq: Once | CUTANEOUS | Status: DC

## 2018-04-12 MED ORDER — FENTANYL CITRATE (PF) 100 MCG/2ML IJ SOLN
INTRAMUSCULAR | Status: AC
  Filled 2018-04-12: qty 2

## 2018-04-12 MED ORDER — LIDOCAINE HCL 1 % IJ SOLN
INTRAMUSCULAR | Status: DC | PRN
  Administered 2018-04-12: 20 mL

## 2018-04-12 MED ORDER — ROCURONIUM BROMIDE 10 MG/ML (PF) SYRINGE
PREFILLED_SYRINGE | INTRAVENOUS | Status: DC | PRN
  Administered 2018-04-12: 40 mg via INTRAVENOUS
  Administered 2018-04-12: 20 mg via INTRAVENOUS

## 2018-04-12 MED ORDER — DEXAMETHASONE SODIUM PHOSPHATE 10 MG/ML IJ SOLN
INTRAMUSCULAR | Status: DC | PRN
  Administered 2018-04-12: 5 mg via INTRAVENOUS

## 2018-04-12 MED ORDER — LIDOCAINE 2% (20 MG/ML) 5 ML SYRINGE
INTRAMUSCULAR | Status: DC | PRN
  Administered 2018-04-12: 40 mg via INTRAVENOUS

## 2018-04-12 MED ORDER — EPHEDRINE SULFATE 50 MG/ML IJ SOLN
INTRAMUSCULAR | Status: AC
  Filled 2018-04-12: qty 1

## 2018-04-12 MED ORDER — FENTANYL CITRATE (PF) 100 MCG/2ML IJ SOLN
50.0000 ug | Freq: Once | INTRAMUSCULAR | Status: AC
  Administered 2018-04-12: 50 ug via INTRAVENOUS

## 2018-04-12 MED ORDER — EPHEDRINE SULFATE 50 MG/ML IJ SOLN
INTRAMUSCULAR | Status: DC | PRN
  Administered 2018-04-12 (×2): 5 mg via INTRAVENOUS

## 2018-04-12 MED ORDER — ACETAMINOPHEN 500 MG PO TABS
1000.0000 mg | ORAL_TABLET | ORAL | Status: AC
  Administered 2018-04-12: 1000 mg via ORAL
  Filled 2018-04-12: qty 2

## 2018-04-12 MED ORDER — LACTATED RINGERS IV SOLN
INTRAVENOUS | Status: DC
  Administered 2018-04-12: 12:00:00 via INTRAVENOUS
  Administered 2018-04-12: 10 mL/h via INTRAVENOUS

## 2018-04-12 MED ORDER — LIDOCAINE 2% (20 MG/ML) 5 ML SYRINGE
INTRAMUSCULAR | Status: AC
  Filled 2018-04-12: qty 5

## 2018-04-12 MED ORDER — OXYCODONE HCL 5 MG/5ML PO SOLN: 5.0000 mg | Freq: Once | ORAL | Status: DC | PRN

## 2018-04-12 MED ORDER — DEXAMETHASONE SODIUM PHOSPHATE 10 MG/ML IJ SOLN
INTRAMUSCULAR | Status: AC
  Filled 2018-04-12: qty 1

## 2018-04-12 MED ORDER — OXYCODONE HCL 5 MG PO TABS: 5.0000 mg | ORAL_TABLET | Freq: Once | ORAL | Status: DC | PRN

## 2018-04-12 MED ORDER — FENTANYL CITRATE (PF) 250 MCG/5ML IJ SOLN
INTRAMUSCULAR | Status: DC | PRN
  Administered 2018-04-12: 50 ug via INTRAVENOUS

## 2018-04-12 MED ORDER — FENTANYL CITRATE (PF) 250 MCG/5ML IJ SOLN
INTRAMUSCULAR | Status: AC
  Filled 2018-04-12: qty 5

## 2018-04-12 MED ORDER — CEFAZOLIN SODIUM-DEXTROSE 2-4 GM/100ML-% IV SOLN
2.0000 g | INTRAVENOUS | Status: AC
  Administered 2018-04-12: 2 g via INTRAVENOUS
  Filled 2018-04-12: qty 100

## 2018-04-12 MED ORDER — ONDANSETRON HCL 4 MG/2ML IJ SOLN
INTRAMUSCULAR | Status: AC
  Filled 2018-04-12: qty 2

## 2018-04-12 MED ORDER — PROPOFOL 10 MG/ML IV BOLUS
INTRAVENOUS | Status: AC
  Filled 2018-04-12: qty 20

## 2018-04-12 MED ORDER — SODIUM CHLORIDE 0.9 % IJ SOLN
INTRAMUSCULAR | Status: AC
  Filled 2018-04-12: qty 10

## 2018-04-12 MED ORDER — 0.9 % SODIUM CHLORIDE (POUR BTL) OPTIME
TOPICAL | Status: DC | PRN
  Administered 2018-04-12: 1000 mL

## 2018-04-12 MED ORDER — ONDANSETRON HCL 4 MG/2ML IJ SOLN
INTRAMUSCULAR | Status: DC | PRN
  Administered 2018-04-12: 4 mg via INTRAVENOUS

## 2018-04-12 MED ORDER — ROCURONIUM BROMIDE 10 MG/ML (PF) SYRINGE
PREFILLED_SYRINGE | INTRAVENOUS | Status: AC
  Filled 2018-04-12: qty 10

## 2018-04-12 SURGICAL SUPPLY — 61 items
ADH SKN CLS APL DERMABOND .7 (GAUZE/BANDAGES/DRESSINGS) ×4
BINDER BREAST LRG (GAUZE/BANDAGES/DRESSINGS) ×2 IMPLANT
BINDER BREAST XLRG (GAUZE/BANDAGES/DRESSINGS) IMPLANT
BLADE CLIPPER SURG (BLADE) IMPLANT
BLADE SURG 10 STRL SS (BLADE) ×4 IMPLANT
CANISTER SUCT 3000ML PPV (MISCELLANEOUS) IMPLANT
CHLORAPREP W/TINT 26ML (MISCELLANEOUS) ×4 IMPLANT
CLIP VESOCCLUDE LG 6/CT (CLIP) ×4 IMPLANT
CLIP VESOCCLUDE MED 6/CT (CLIP) ×4 IMPLANT
CLOSURE WOUND 1/2 X4 (GAUZE/BANDAGES/DRESSINGS) ×1
COVER PROBE W GEL 5X96 (DRAPES) ×4 IMPLANT
COVER SURGICAL LIGHT HANDLE (MISCELLANEOUS) ×4 IMPLANT
DECANTER SPIKE VIAL GLASS SM (MISCELLANEOUS) ×8 IMPLANT
DERMABOND ADVANCED (GAUZE/BANDAGES/DRESSINGS) ×4
DERMABOND ADVANCED .7 DNX12 (GAUZE/BANDAGES/DRESSINGS) ×2 IMPLANT
DEVICE DUBIN SPECIMEN MAMMOGRA (MISCELLANEOUS) ×4 IMPLANT
DRAIN PENROSE 1/2X12 LTX STRL (WOUND CARE) IMPLANT
DRAPE CHEST BREAST 15X10 FENES (DRAPES) ×4 IMPLANT
DRAPE LAPAROTOMY TRNSV 102X78 (DRAPE) ×4 IMPLANT
DRAPE UTILITY XL STRL (DRAPES) ×8 IMPLANT
DRSG PAD ABDOMINAL 8X10 ST (GAUZE/BANDAGES/DRESSINGS) ×4 IMPLANT
ELECT CAUTERY BLADE 6.4 (BLADE) ×4 IMPLANT
ELECT REM PT RETURN 9FT ADLT (ELECTROSURGICAL) ×4
ELECTRODE REM PT RTRN 9FT ADLT (ELECTROSURGICAL) ×2 IMPLANT
GAUZE SPONGE 4X4 12PLY STRL LF (GAUZE/BANDAGES/DRESSINGS) ×4 IMPLANT
GLOVE BIO SURGEON STRL SZ 6 (GLOVE) ×4 IMPLANT
GLOVE ECLIPSE 7.5 STRL STRAW (GLOVE) ×2 IMPLANT
GLOVE INDICATOR 6.5 STRL GRN (GLOVE) ×4 IMPLANT
GOWN STRL REUS W/ TWL LRG LVL3 (GOWN DISPOSABLE) ×2 IMPLANT
GOWN STRL REUS W/TWL 2XL LVL3 (GOWN DISPOSABLE) ×4 IMPLANT
GOWN STRL REUS W/TWL LRG LVL3 (GOWN DISPOSABLE) ×4
KIT BASIN OR (CUSTOM PROCEDURE TRAY) ×4 IMPLANT
KIT MARKER MARGIN INK (KITS) ×4 IMPLANT
KIT TURNOVER KIT B (KITS) ×4 IMPLANT
LIGHT WAVEGUIDE WIDE FLAT (MISCELLANEOUS) IMPLANT
MESH PARIETEX PROGRIP RIGHT (Mesh General) ×2 IMPLANT
NDL HYPO 25GX1X1/2 BEV (NEEDLE) ×2 IMPLANT
NEEDLE HYPO 25GX1X1/2 BEV (NEEDLE) ×4 IMPLANT
NS IRRIG 1000ML POUR BTL (IV SOLUTION) ×4 IMPLANT
PACK SURGICAL SETUP 50X90 (CUSTOM PROCEDURE TRAY) ×4 IMPLANT
PAD ARMBOARD 7.5X6 YLW CONV (MISCELLANEOUS) ×4 IMPLANT
PENCIL BUTTON HOLSTER BLD 10FT (ELECTRODE) ×4 IMPLANT
SPONGE INTESTINAL PEANUT (DISPOSABLE) ×4 IMPLANT
SPONGE LAP 18X18 X RAY DECT (DISPOSABLE) ×4 IMPLANT
STRIP CLOSURE SKIN 1/2X4 (GAUZE/BANDAGES/DRESSINGS) ×3 IMPLANT
SUT MNCRL AB 4-0 PS2 18 (SUTURE) ×4 IMPLANT
SUT PROLENE 2 0 SH 30 (SUTURE) ×2 IMPLANT
SUT SILK 2 0 SH (SUTURE) ×4 IMPLANT
SUT VIC AB 2-0 SH 27 (SUTURE) ×8
SUT VIC AB 2-0 SH 27X BRD (SUTURE) ×2 IMPLANT
SUT VIC AB 2-0 SH 27XBRD (SUTURE) ×2 IMPLANT
SUT VIC AB 3-0 SH 27 (SUTURE) ×12
SUT VIC AB 3-0 SH 27X BRD (SUTURE) ×2 IMPLANT
SUT VIC AB 3-0 SH 27XBRD (SUTURE) ×2 IMPLANT
SYR BULB 3OZ (MISCELLANEOUS) ×4 IMPLANT
SYR CONTROL 10ML LL (SYRINGE) ×4 IMPLANT
TOWEL OR 17X24 6PK STRL BLUE (TOWEL DISPOSABLE) ×4 IMPLANT
TOWEL OR 17X26 10 PK STRL BLUE (TOWEL DISPOSABLE) ×4 IMPLANT
TUBE CONNECTING 12'X1/4 (SUCTIONS)
TUBE CONNECTING 12X1/4 (SUCTIONS) IMPLANT
YANKAUER SUCT BULB TIP NO VENT (SUCTIONS) IMPLANT

## 2018-04-12 NOTE — Anesthesia Procedure Notes (Signed)
Anesthesia Regional Block: TAP block   Pre-Anesthetic Checklist: ,, timeout performed, Correct Patient, Correct Site, Correct Laterality, Correct Procedure, Correct Position, site marked, Risks and benefits discussed, pre-op evaluation,  At surgeon's request and post-op pain management  Laterality: Right  Prep: Maximum Sterile Barrier Precautions used, chloraprep       Needles:  Injection technique: Single-shot  Needle Type: Echogenic Stimulator Needle     Needle Length: 9cm  Needle Gauge: 21     Additional Needles:   Narrative:  Start time: 04/12/2018 10:10 AM End time: 04/12/2018 10:15 AM Injection made incrementally with aspirations every 5 mL.  Performed by: Personally  Anesthesiologist: Roberts Gaudy, MD  Additional Notes: 20 cc 0.75% Ropivacaine with 0.3 mg clonidine injected easily

## 2018-04-12 NOTE — Anesthesia Postprocedure Evaluation (Signed)
Anesthesia Post Note  Patient: RUFINA KIMERY  Procedure(s) Performed: BREAST LUMPECTOMY WITH RADIOACTIVE SEED LOCALIZATION (Right Breast) OPEN RIGHT INGUINAL HERNIA REPAIR WITH MESH (Right Inguinal)     Patient location during evaluation: PACU Anesthesia Type: General and Regional Level of consciousness: awake and alert Pain management: pain level controlled Vital Signs Assessment: post-procedure vital signs reviewed and stable Respiratory status: spontaneous breathing, nonlabored ventilation, respiratory function stable and patient connected to nasal cannula oxygen Cardiovascular status: blood pressure returned to baseline and stable Postop Assessment: no apparent nausea or vomiting Anesthetic complications: no    Last Vitals:  Vitals:   04/12/18 1420 04/12/18 1430  BP: (!) 167/61 (!) 156/57  Pulse: 65   Resp: 16   Temp:  (!) 36.4 C  SpO2: 98%     Last Pain:  Vitals:   04/12/18 1300  TempSrc:   PainSc: Asleep                 Rosamond Andress COKER

## 2018-04-12 NOTE — Transfer of Care (Signed)
Immediate Anesthesia Transfer of Care Note  Patient: Amber Morgan  Procedure(s) Performed: BREAST LUMPECTOMY WITH RADIOACTIVE SEED LOCALIZATION (Right Breast) OPEN RIGHT INGUINAL HERNIA REPAIR WITH MESH (Right Inguinal)  Patient Location: PACU  Anesthesia Type:General  Level of Consciousness: awake, oriented and patient cooperative  Airway & Oxygen Therapy: Patient Spontanous Breathing and Patient connected to nasal cannula oxygen  Post-op Assessment: Report given to RN and Post -op Vital signs reviewed and stable  Post vital signs: Reviewed  Last Vitals:  Vitals Value Taken Time  BP 140/58 04/12/2018 12:41 PM  Temp 36.2 C 04/12/2018 12:40 PM  Pulse 65 04/12/2018 12:50 PM  Resp 9 04/12/2018 12:50 PM  SpO2 98 % 04/12/2018 12:50 PM  Vitals shown include unvalidated device data.  Last Pain:  Vitals:   04/12/18 1240  TempSrc:   PainSc: 0-No pain      Patients Stated Pain Goal: 3 (28/76/81 1572)  Complications: No apparent anesthesia complications

## 2018-04-12 NOTE — Discharge Instructions (Signed)
[place] Office Phone Number [phone]  BREAST BIOPSY/ PARTIAL MASTECTOMY: POST OP INSTRUCTIONS  Always review your discharge instruction sheet given to you by the facility where your surgery was performed.  IF YOU HAVE DISABILITY OR FAMILY LEAVE FORMS, YOU MUST BRING THEM TO THE OFFICE FOR PROCESSING.  DO NOT GIVE THEM TO YOUR DOCTOR.  1. A prescription for pain medication may be given to you upon discharge.  Take your pain medication as prescribed, if needed.  If narcotic pain medicine is not needed, then you may take acetaminophen (Tylenol) or ibuprofen (Advil) as needed. 2. Take your usually prescribed medications unless otherwise directed 3. If you need a refill on your pain medication, please contact your pharmacy.  They will contact our office to request authorization.  Prescriptions will not be filled after 5pm or on week-ends. 4. You should eat very light the first 24 hours after surgery, such as soup, crackers, pudding, etc.  Resume your normal diet the day after surgery. 5. Most patients will experience some swelling and bruising in the breast.  Ice packs and a good support bra will help.  Swelling and bruising can take several days to resolve.  6. It is common to experience some constipation if taking pain medication after surgery.  Increasing fluid intake and taking a stool softener will usually help or prevent this problem from occurring.  A mild laxative (Milk of Magnesia or Miralax) should be taken according to package directions if there are no bowel movements after 48 hours. 7. Unless discharge instructions indicate otherwise, you may remove your bandages 48 hours after surgery, and you may shower at that time.  You may have steri-strips (small skin tapes) in place directly over the incision.  These strips should be left on the skin for 7-10 days.   Any sutures or staples will be removed at the office during your follow-up visit. 8. ACTIVITIES:  Wearing a good  support bra or sports bra (or the breast binder) minimizes pain and swelling.   You may resume regular (light) daily activities beginning the next day--such as daily self-care, walking, climbing stairs--gradually increasing activities as tolerated.  You may have sexual intercourse when it is comfortable.  Refrain from any heavy lifting or straining until approved by your doctor. 9. Most patients will experience some swelling and bruising around the umbilicus or in the groin and scrotum.  Ice packs and reclining will help.  Swelling and bruising can take several days to resolve.  a. You may drive when you no longer are taking prescription pain medication, you can comfortably wear a seatbelt, and you can safely maneuver your car and apply brakes. b. RETURN TO WORK:  __________n/a____________ 10. You should see your doctor in the office for a follow-up appointment approximately two weeks after your surgery.  Your doctors nurse will typically make your follow-up appointment when she calls you with your pathology report.  Expect your pathology report 2-3 business days after your surgery.  You may call to check if you do not hear from [place] after three days.   WHEN TO CALL YOUR DOCTOR: 1. Fever over 101.0 2. Nausea and/or vomiting. 3. Extreme swelling or bruising. 4. Continued bleeding from incision. 5. Increased pain, redness, or drainage from the incision.  The clinic staff is available to answer your questions during regular business hours.  Please dont hesitate to call and ask to speak to one of the nurses for clinical concerns.  If you have a medical emergency, go  to the nearest emergency room or call 911.  A surgeon from [facility] is always on call at the hospital.  For further questions, please visit [email or web address]

## 2018-04-12 NOTE — Op Note (Addendum)
Right Breast Radioactive seed localized lumpectomy, right inguinal hernia repair with mesh.     Indications: This patient presents with history of right breast cancer (invasive mammary, lobular phenotype), upper inner quadrant, grade 2, +/+/-, cT1bN0.  Symptomatic right inguinal hernia  Pre-operative Diagnosis: right breast cancer, reducible right inguinal hernia  Post-operative Diagnosis: same  Surgeon: [name]   Anesthesia: General endotracheal anesthesia  ASA Class: 3  Procedure Details  The patient was seen in the Holding Room. The risks, benefits, complications, treatment options, and expected outcomes were discussed with the patient. The possibilities of bleeding, infection, the need for additional procedures, failure to diagnose a condition, and creating a complication requiring transfusion or operation were discussed with the patient. The patient concurred with the proposed plan, giving informed consent.  The site of surgery properly noted/marked. The patient was taken to Operating Room # 12, identified, and the procedure verified as right Breast seed targeted Lumpectomy and open right inguinal hernia repair with mesh. A Time Out was held and the above information confirmed.  The right arm, breast, chest, abdomen, and groin were prepped and draped in standard fashion. The lumpectomy was performed by creating a(n) superior circumareolar incision  near the previously placed radioactive seed.  Dissection was carried down to around the point of maximum signal intensity. The cautery was used to perform the dissection.  Hemostasis was achieved with cautery. The edges of the cavity were marked with large clips, with one each medial, lateral, inferior and superior, and two clips posteriorly.   The specimen was inked with the margin marker paint kit.    Specimen radiography confirmed inclusion of the mammographic lesion, the clip, and the seed.  The background signal in the breast was zero.  The  wound was irrigated and closed with 3-0 vicryl in layers and 4-0 monocryl subcuticular suture.      Attention was then directed to the right groin.  Exparel was used to anesthetize the skin over the mid-portion of the inguinal canal as well as deep near the ilioinguinal nerve. A transverse incision was made along the right side of her prior pfannenstiel incision. Dissection was carried through the soft tissue to expose the inguinal canal and inguinal ligament along its lower edge. The external oblique fascia was split along the course of its fibers, exposing the inguinal canal. The ilioinguinal nerve was not visualized. The prior pfannenstiel scar was just superior to the hernia defect.  The patient had a weak inguinal floor and a large fat containing hernia. The cord lipoma/fat was removed.  The hernia sac was too thin to hold sutures.  The indirect defect was closed with 2-0 prolene.  The inguinal floor was reinforced with the mesh.  The Progrip mesh was selected and placed over the defect. This was secured to the pubic tubercle with a 2-0 prolene. The external oblique fashion was then closed in a continuous fashion using 2-0 Vicryl suture in running fashion.  Scarpa's fascia was also closed in running fashion with a 2-0 vicryl.  The skin was reapproximated with 3-0 vicryl deep dermal interrupted suture and running 4-0 monocryl subcuticular suture.    Sterile dressings were applied. At the end of the operation, all sponge, instrument, and needle counts were correct.  Findings: grossly clear surgical margins and no adenopathy.  Pectoralis is posterior margin, skin is anterior margin.  Indirect and direct right inguinal hernia  Estimated Blood Loss:  min         Specimens: right breast lumpectomy with  seed         Complications:  None; patient tolerated the procedure well.         Disposition: PACU - hemodynamically stable.         Condition: stable

## 2018-04-12 NOTE — Progress Notes (Signed)
Patient to discharge home, discharge instructions reviewed with patient and family, patient ambulated to bathroom and voided, peripheral IV dc'd, no questions from patient or family.  Rowe Pavy, RN

## 2018-04-12 NOTE — Anesthesia Preprocedure Evaluation (Addendum)
Anesthesia Evaluation  Patient identified by MRN, date of birth, ID band Patient awake    Reviewed: Allergy & Precautions, NPO status , Patient's Chart, lab work & pertinent test results  Airway Mallampati: II  TM Distance: >3 FB Neck ROM: Full    Dental  (+) Teeth Intact   Pulmonary    breath sounds clear to auscultation       Cardiovascular hypertension, Pt. on medications  Rhythm:Regular Rate:Normal     Neuro/Psych    GI/Hepatic   Endo/Other    Renal/GU      Musculoskeletal   Abdominal   Peds  Hematology   Anesthesia Other Findings   Reproductive/Obstetrics                            Anesthesia Physical Anesthesia Plan  ASA: III  Anesthesia Plan: General   Post-op Pain Management:  Regional for Post-op pain   Induction: Intravenous  PONV Risk Score and Plan: Ondansetron and Dexamethasone  Airway Management Planned: Oral ETT  Additional Equipment:   Intra-op Plan:   Post-operative Plan: Extubation in OR  Informed Consent: I have reviewed the patients History and Physical, chart, labs and discussed the procedure including the risks, benefits and alternatives for the proposed anesthesia with the patient or authorized representative who has indicated his/her understanding and acceptance.   Dental advisory given  Plan Discussed with: CRNA and Anesthesiologist  Anesthesia Plan Comments:        Anesthesia Quick Evaluation

## 2018-04-12 NOTE — Interval H&P Note (Signed)
History and Physical Interval Note:  04/12/2018 9:59 AM  [name]  has presented today for surgery, with the diagnosis of RIGHT BREAST CANCER, RIGHT INGUINAL HERNIA  The various methods of treatment have been discussed with the patient and family. After consideration of risks, benefits and other options for treatment, the patient has consented to  Procedure(s): BREAST LUMPECTOMY WITH RADIOACTIVE SEED LOCALIZATION (Right) OPEN RIGHT INGUINAL HERNIA REPAIR WITH MESH (Right) as a surgical intervention .  The patient's history has been reviewed, patient examined, no change in status, stable for surgery.  I have reviewed the patient's chart and labs.  Questions were answered to the patient's satisfaction.     Amber Morgan

## 2018-04-12 NOTE — Anesthesia Procedure Notes (Signed)
Procedure Name: Intubation Date/Time: 04/12/2018 10:38 AM Performed by: Jenne Campus, CRNA Pre-anesthesia Checklist: Patient identified, Emergency Drugs available, Suction available and Patient being monitored Patient Re-evaluated:Patient Re-evaluated prior to induction Oxygen Delivery Method: Circle System Utilized Preoxygenation: Pre-oxygenation with 100% oxygen Induction Type: IV induction Ventilation: Mask ventilation without difficulty Laryngoscope Size: Miller and 2 Grade View: Grade I Tube type: Oral Tube size: 7.0 mm Number of attempts: 1 Airway Equipment and Method: Stylet and Oral airway Placement Confirmation: ETT inserted through vocal cords under direct vision,  positive ETCO2 and breath sounds checked- equal and bilateral Secured at: 20 cm Tube secured with: Tape Dental Injury: Teeth and Oropharynx as per pre-operative assessment

## 2018-04-13 ENCOUNTER — Encounter (HOSPITAL_COMMUNITY): Payer: Self-pay | Admitting: General Surgery

## 2018-04-14 NOTE — Progress Notes (Signed)
Please let patient know margins are all negative!

## 2018-04-15 ENCOUNTER — Ambulatory Visit (INDEPENDENT_AMBULATORY_CARE_PROVIDER_SITE_OTHER): Payer: Medicare HMO | Admitting: Family Medicine

## 2018-04-15 ENCOUNTER — Encounter: Payer: Self-pay | Admitting: Family Medicine

## 2018-04-15 DIAGNOSIS — F4321 Adjustment disorder with depressed mood: Secondary | ICD-10-CM | POA: Diagnosis not present

## 2018-04-15 DIAGNOSIS — C50211 Malignant neoplasm of upper-inner quadrant of right female breast: Secondary | ICD-10-CM | POA: Diagnosis not present

## 2018-04-15 DIAGNOSIS — R739 Hyperglycemia, unspecified: Secondary | ICD-10-CM

## 2018-04-15 DIAGNOSIS — M8580 Other specified disorders of bone density and structure, unspecified site: Secondary | ICD-10-CM

## 2018-04-15 DIAGNOSIS — Z170 Estrogen receptor positive status [ER+]: Secondary | ICD-10-CM | POA: Diagnosis not present

## 2018-04-15 DIAGNOSIS — I10 Essential (primary) hypertension: Secondary | ICD-10-CM

## 2018-04-15 DIAGNOSIS — R000 Tachycardia, unspecified: Secondary | ICD-10-CM | POA: Diagnosis not present

## 2018-04-15 NOTE — Progress Notes (Signed)
Subjective:  I acted as a Education administrator for Dr. Charlett Blake. [name], [place]  Patient ID: Amber Morgan, female    DOB: Mar 20, 1927, 84 y.o.   MRN: [id number]  Chief Complaint  Patient presents with  . Follow-up    HPI  Patient is in today for a 3 month follow up and accompanied by one of her sons. She is recovering well from her lumpectomy which she reports she handled well. They went ahead and repaired her inguinal hernia as well at the same time and this is healing well. She is tearful due to the death of her husband of 46 years recently. Today is his birthday. Denies CP/palp/SOB/HA/congestion/fevers/GI or GU c/o. Taking meds as prescribed  Patient Care Team: Mosie Lukes, MD as PCP - General (Family Medicine) Jerline Pain Mingo Amber, DO as Consulting Physician (Osteopathic Medicine) Rolan Lipa, MD as Consulting Physician (Gastroenterology) Stanford Breed Denice Bors, MD as Consulting Physician (Cardiology) [name], MD as Consulting Physician (General Surgery) Magrinat, Virgie Dad, MD as Consulting Physician (Oncology) Eppie Gibson, MD as Attending Physician (Radiation Oncology)   Past Medical History:  Diagnosis Date  . Allergy   . Arthritis   . Cancer [facility])    breast cancer   . Colon polyps   . Diverticulitis   . Diverticulosis 01/04/2014  . Glaucoma   . Hernia, inguinal, right   . History of chicken pox    childhood  . History of hiatal hernia   . HTN (hypertension)   . Hyperglycemia 09/05/2013  . Lipoma of arm 12/09/2014   right  . Nocturia 02/08/2013  . Osteopenia 12/09/2014  . Personal history of colonic polyps 09/05/2013  . Pre-diabetes   . Preventative health care 08/14/2016  . Unspecified constipation 02/06/2013  . Urinary incontinence   . Viral infection characterized by skin and mucous membrane lesions 05/11/2013  . Wears glasses     Past Surgical History:  Procedure Laterality Date  . ABDOMINAL HYSTERECTOMY    . APPENDECTOMY    . BLADDER SURGERY  2003   bladder tact  .  BREAST LUMPECTOMY WITH RADIOACTIVE SEED LOCALIZATION Right 04/12/2018   Procedure: BREAST LUMPECTOMY WITH RADIOACTIVE SEED LOCALIZATION;  Surgeon: [name], MD;  Location: [place];  Service: General;  Laterality: Right;  . CATARACT EXTRACTION, BILATERAL    . COLONOSCOPY W/ BIOPSIES AND POLYPECTOMY    . EYE SURGERY     multiple bilateral  . INGUINAL HERNIA REPAIR Right 04/12/2018   Procedure: OPEN RIGHT INGUINAL HERNIA REPAIR WITH MESH;  Surgeon: [name], MD;  Location: [place];  Service: General;  Laterality: Right;  . RECTOCELE REPAIR Bilateral     Family History  Problem Relation Age of Onset  . Colon cancer Mother   . Hypertension Mother   . Heart disease Father   . Meniere's disease Father   . Heart disease Sister   . Arthritis Son        back  . Glaucoma Son   . Hypertension Son   . Cataracts Son   . Colon cancer Sister   . Alzheimer's disease Sister   . Glaucoma Sister   . Glaucoma Sister   . Hyperlipidemia Sister   . Hypertension Sister   . Cancer Sister        thyroid  . Diabetes Sister   . Glaucoma Sister     Social History   Socioeconomic History  . Marital status: Married    Spouse name: Not on file  . Number of children: 4  .  Years of education: Not on file  . Highest education level: Not on file  Occupational History  . Not on file  Social Needs  . Financial resource strain: Not on file  . Food insecurity:    Worry: Not on file    Inability: Not on file  . Transportation needs:    Medical: Not on file    Non-medical: Not on file  Tobacco Use  . Smoking status: Never Smoker  . Smokeless tobacco: Never Used  Substance and Sexual Activity  . Alcohol use: No  . Drug use: No  . Sexual activity: Not Currently  Lifestyle  . Physical activity:    Days per week: Not on file    Minutes per session: Not on file  . Stress: Not on file  Relationships  . Social connections:    Talks on phone: Not on file    Gets together: Not on file    Attends  religious service: Not on file    Active member of club or organization: Not on file    Attends meetings of clubs or organizations: Not on file    Relationship status: Not on file  . Intimate partner violence:    Fear of current or ex partner: Not on file    Emotionally abused: Not on file    Physically abused: Not on file    Forced sexual activity: Not on file  Other Topics Concern  . Not on file  Social History Narrative  . Not on file    Outpatient Medications Prior to Visit  Medication Sig Dispense Refill  . acetaminophen (TYLENOL) 500 MG tablet Take 1-2 tablets (500-1,000 mg total) by mouth every 8 (eight) hours as needed for mild pain, moderate pain or headache (Take at least three times per day for the first 3 days post op.). 30 tablet 0  . amLODipine (NORVASC) 2.5 MG tablet Take 1 tablet (2.5 mg total) by mouth daily. (Patient taking differently: Take 2.5 mg by mouth daily as needed (If systolic bp is 740 or higher). ) 90 tablet 1  . Artificial Tear Solution (GENTEAL TEARS OP) Place 1 drop into both eyes 2 (two) times daily.    . Fiber POWD Take 1 Scoop by mouth daily.    . Multiple Vitamins-Minerals (ONE-A-DAY WOMENS 50 PLUS PO) Take 1 each by mouth daily.    Marland Kitchen oxyCODONE (OXY IR/ROXICODONE) 5 MG immediate release tablet Take 0.5-1 tablets (2.5-5 mg total) by mouth every 6 (six) hours as needed for severe pain. (Patient not taking: Reported on 04/15/2018) 10 tablet 0  . polyethylene glycol (MIRALAX / GLYCOLAX) packet Take 17 g by mouth 2 (two) times daily.    . potassium chloride SA (K-DUR,KLOR-CON) 20 MEQ tablet TAKE 1 TABLET BY MOUTH ONCE DAILY (Patient not taking: Reported on 04/15/2018) 90 tablet 1  . Probiotic Product (PROBIOTIC DAILY PO) Take 1 capsule by mouth daily.     Marland Kitchen triamterene-hydrochlorothiazide (MAXZIDE-25) 37.5-25 MG tablet TAKE 1 TABLET EVERY DAY (Patient not taking: Reported on 04/15/2018) 90 tablet 2   No facility-administered medications prior to visit.      No Known Allergies  Review of Systems  Constitutional: Positive for malaise/fatigue. Negative for fever.  HENT: Negative for congestion.   Eyes: Negative for blurred vision.  Respiratory: Negative for shortness of breath.   Cardiovascular: Negative for chest pain, palpitations and leg swelling.  Gastrointestinal: Negative for abdominal pain, blood in stool and nausea.  Genitourinary: Negative for dysuria and frequency.  Musculoskeletal:  Negative for falls.  Skin: Negative for rash.  Neurological: Negative for dizziness, loss of consciousness and headaches.  Endo/Heme/Allergies: Negative for environmental allergies.  Psychiatric/Behavioral: Negative for depression and suicidal ideas. The patient is not nervous/anxious.        Objective:    Physical Exam  Constitutional: She is oriented to person, place, and time. She appears well-developed and well-nourished. No distress.  HENT:  Head: Normocephalic and atraumatic.  Eyes: Conjunctivae are normal.  Neck: Neck supple. No thyromegaly present.  Cardiovascular: Normal rate, regular rhythm and normal heart sounds.  No murmur heard. Pulmonary/Chest: Effort normal and breath sounds normal. No respiratory distress.  Abdominal: Soft. Bowel sounds are normal. She exhibits no distension and no mass. There is no tenderness.  Musculoskeletal: She exhibits no edema.  Lymphadenopathy:    She has no cervical adenopathy.  Neurological: She is alert and oriented to person, place, and time.  Skin: Skin is warm and dry.  Psychiatric: She has a normal mood and affect. Her behavior is normal.    BP (!) 144/55 (BP Location: Left Arm, Patient Position: Sitting, Cuff Size: Normal)   Pulse 73   Temp 97.7 F (36.5 C) (Oral)   Resp 18   Wt 116 lb (52.6 kg)   SpO2 97%   BMI 23.43 kg/m  Wt Readings from Last 3 Encounters:  04/15/18 116 lb (52.6 kg)  04/04/18 117 lb 11.2 oz (53.4 kg)  03/30/18 115 lb 14.4 oz (52.6 kg)   BP Readings from Last  3 Encounters:  04/15/18 (!) 144/55  04/12/18 (!) 156/57  04/04/18 (!) 169/58     Immunization History  Administered Date(s) Administered  . Influenza Split 07/13/2012  . Influenza, High Dose Seasonal PF 06/22/2016, 06/04/2017  . Influenza,inj,Quad PF,6+ Mos 06/22/2013, 06/01/2014, 07/08/2015  . Pneumococcal Conjugate-13 06/01/2014  . Pneumococcal Polysaccharide-23 12/07/2009  . Tdap 05/21/2013  . Zoster 07/14/2011    Health Maintenance  Topic Date Due  . INFLUENZA VACCINE  05/05/2018  . MAMMOGRAM  03/04/2019  . TETANUS/TDAP  05/22/2023  . DEXA SCAN  Completed  . PNA vac Low Risk Adult  Completed    Lab Results  Component Value Date   WBC 5.6 03/30/2018   HGB 11.7 03/30/2018   HCT 35.2 03/30/2018   PLT 213 03/30/2018   GLUCOSE 96 03/30/2018   CHOL 136 09/17/2017   TRIG 62.0 09/17/2017   HDL 56.80 09/17/2017   LDLCALC 67 09/17/2017   ALT 21 03/30/2018   AST 20 03/30/2018   NA 140 03/30/2018   K 4.0 03/30/2018   CL 103 03/30/2018   CREATININE 0.97 03/30/2018   BUN 31 (H) 03/30/2018   CO2 30 03/30/2018   TSH 3.23 09/17/2017   HGBA1C 6.1 (H) 04/04/2018   MICROALBUR 0.8 08/06/2016    Lab Results  Component Value Date   TSH 3.23 09/17/2017   Lab Results  Component Value Date   WBC 5.6 03/30/2018   HGB 11.7 03/30/2018   HCT 35.2 03/30/2018   MCV 95.6 03/30/2018   PLT 213 03/30/2018   Lab Results  Component Value Date   NA 140 03/30/2018   K 4.0 03/30/2018   CO2 30 03/30/2018   GLUCOSE 96 03/30/2018   BUN 31 (H) 03/30/2018   CREATININE 0.97 03/30/2018   BILITOT 0.3 03/30/2018   ALKPHOS 54 03/30/2018   AST 20 03/30/2018   ALT 21 03/30/2018   PROT 6.4 (L) 03/30/2018   ALBUMIN 3.9 03/30/2018   CALCIUM 9.8 03/30/2018   ANIONGAP  7 03/30/2018   GFR 59.66 (L) 01/11/2018   Lab Results  Component Value Date   CHOL 136 09/17/2017   Lab Results  Component Value Date   HDL 56.80 09/17/2017   Lab Results  Component Value Date   LDLCALC 67 09/17/2017    Lab Results  Component Value Date   TRIG 62.0 09/17/2017   Lab Results  Component Value Date   CHOLHDL 2 09/17/2017   Lab Results  Component Value Date   HGBA1C 6.1 (H) 04/04/2018         Assessment & Plan:   Problem List Items Addressed This Visit    HTN (hypertension)    Well controlled, no changes to meds. Encouraged heart healthy diet such as the DASH diet and exercise as tolerated.       Hyperglycemia    hgba1c acceptable, minimize simple carbs. Increase exercise as tolerated.      Osteopenia    Encouraged to get adequate exercise, calcium and vitamin d intake      Tachycardia    RRR today      Malignant neoplasm of upper-inner quadrant of right breast in female, estrogen receptor positive ([place])    Has tolerated her lumpectomy and they believe they got all of the cancer. She is following with oncology      Grief    She was recently widowed after over 79 years of marriage and today is her husband's birthday so she is tearful today. She does feel she is managing her grief well and she will let us know if she needs further intervention from [place].          I am having [name] maintain her Multiple Vitamins-Minerals (ONE-A-DAY WOMENS 50 PLUS PO), Probiotic Product (PROBIOTIC DAILY PO), triamterene-hydrochlorothiazide, amLODipine, potassium chloride SA, polyethylene glycol, Artificial Tear Solution (GENTEAL TEARS OP), Fiber, acetaminophen, and oxyCODONE.  No orders of the defined types were placed in this encounter.   CMA served as Education administrator during this visit. History, Physical and Plan performed by medical provider. Documentation and orders reviewed and attested to.  Penni Homans, MD

## 2018-04-15 NOTE — Patient Instructions (Signed)
Open Hernia Repair, Adult Open hernia repair is a surgical procedure to fix a hernia. A hernia occurs when an internal organ or tissue pushes out through a weak spot in the abdominal wall muscles. Hernias commonly occur in the groin and around the navel. Most hernias tend to get worse over time. Often, surgery is done to prevent the hernia from becoming bigger, uncomfortable, or an emergency. Emergency surgery may be needed if abdominal contents get stuck in the opening (incarcerated hernia) or the blood supply gets cut off (strangulated hernia). In an open repair, an incision is made in the abdomen to perform the surgery. Tell a health care provider about:  Any allergies you have.  All medicines you are taking, including vitamins, herbs, eye drops, creams, and over-the-counter medicines.  Any problems you or family members have had with anesthetic medicines.  Any blood or bone disorders you have.  Any surgeries you have had.  Any medical conditions you have, including any recent cold or flu symptoms.  Whether you are pregnant or may be pregnant. What are the risks? Generally, this is a safe procedure. However, problems may occur, including:  Long-lasting (chronic) pain.  Bleeding.  Infection.  Damage to the testicle. This can cause shrinking or swelling.  Damage to the bladder, blood vessels, intestine, or nerves near the hernia.  Trouble passing urine.  Allergic reactions to medicines.  Return of the hernia.  What happens before the procedure? Staying hydrated Follow instructions from your health care provider about hydration, which may include:  Up to 2 hours before the procedure - you may continue to drink clear liquids, such as water, clear fruit juice, black coffee, and plain tea.  Eating and drinking restrictions Follow instructions from your health care provider about eating and drinking, which may include:  8 hours before the procedure - stop eating heavy meals  or foods such as meat, fried foods, or fatty foods.  6 hours before the procedure - stop eating light meals or foods, such as toast or cereal.  6 hours before the procedure - stop drinking milk or drinks that contain milk.  2 hours before the procedure - stop drinking clear liquids.  Medicines  Ask your health care provider about: ? Changing or stopping your regular medicines. This is especially important if you are taking diabetes medicines or blood thinners. ? Taking medicines such as aspirin and ibuprofen. These medicines can thin your blood. Do not take these medicines before your procedure if your health care provider instructs you not to.  You may be given antibiotic medicine to help prevent infection. General instructions  You may have blood tests or imaging studies.  Ask your health care provider how your surgical site will be marked or identified.  If you smoke, do not smoke for at least 2 weeks before your procedure or for as long as told by your health care provider.  Let your health care provider know if you develop a cold or any infection before your surgery.  Plan to have someone take you home from the hospital or clinic.  If you will be going home right after the procedure, plan to have someone with you for 24 hours. What happens during the procedure?  To reduce your risk of infection: ? Your health care team will wash or sanitize their hands. ? Your skin will be washed with soap. ? Hair may be removed from the surgical area.  An IV tube will be inserted into one of your veins.  You will be given one or more of the following: ? A medicine to help you relax (sedative). ? A medicine to numb the area (local anesthetic). ? A medicine to make you fall asleep (general anesthetic).  Your surgeon will make an incision over the hernia.  The tissues of the hernia will be moved back into place.  The edges of the hernia may be stitched together.  The opening in the  abdominal muscles will be closed with stitches (sutures). Or, your surgeon will place a mesh patch made of manmade (synthetic) material over the opening.  The incision will be closed.  A bandage (dressing) may be placed over the incision. The procedure may vary among health care providers and hospitals. What happens after the procedure?  Your blood pressure, heart rate, breathing rate, and blood oxygen level will be monitored until the medicines you were given have worn off.  You may be given medicine for pain.  Do not drive for 24 hours if you received a sedative. This information is not intended to replace advice given to you by your health care provider. Make sure you discuss any questions you have with your health care provider. Document Released: 03/17/2001 Document Revised: 04/10/2016 Document Reviewed: 03/04/2016 Elsevier Interactive Patient Education  [employer].

## 2018-04-20 DIAGNOSIS — F4321 Adjustment disorder with depressed mood: Secondary | ICD-10-CM | POA: Insufficient documentation

## 2018-04-20 NOTE — Assessment & Plan Note (Signed)
Has tolerated her lumpectomy and they believe they got all of the cancer. She is following with oncology

## 2018-04-20 NOTE — Assessment & Plan Note (Signed)
hgba1c acceptable, minimize simple carbs. Increase exercise as tolerated.  

## 2018-04-20 NOTE — Assessment & Plan Note (Signed)
RRR today 

## 2018-04-20 NOTE — Assessment & Plan Note (Signed)
Encouraged to get adequate exercise, calcium and vitamin d intake 

## 2018-04-20 NOTE — Assessment & Plan Note (Signed)
Well controlled, no changes to meds. Encouraged heart healthy diet such as the DASH diet and exercise as tolerated.  °

## 2018-04-20 NOTE — Assessment & Plan Note (Signed)
She was recently widowed after over 46 years of marriage and today is her husband's birthday so she is tearful today. She does feel she is managing her grief well and she will let us know if she needs further intervention from [place].

## 2018-04-21 ENCOUNTER — Ambulatory Visit (HOSPITAL_BASED_OUTPATIENT_CLINIC_OR_DEPARTMENT_OTHER)
Admission: RE | Admit: 2018-04-21 | Discharge: 2018-04-21 | Disposition: A | Discharge status: Home / Self Care | Payer: Medicare HMO | Source: Ambulatory Visit | Attending: Medical | Admitting: Medical

## 2018-04-21 ENCOUNTER — Encounter: Payer: Self-pay | Admitting: Medical

## 2018-04-21 ENCOUNTER — Ambulatory Visit (INDEPENDENT_AMBULATORY_CARE_PROVIDER_SITE_OTHER): Payer: Medicare HMO | Admitting: Medical

## 2018-04-21 VITALS — BP 163/59 | HR 80 | Temp 98.2°F | Ht 59.0 in | Wt 121.4 lb

## 2018-04-21 DIAGNOSIS — M79605 Pain in left leg: Secondary | ICD-10-CM

## 2018-04-21 DIAGNOSIS — I8002 Phlebitis and thrombophlebitis of superficial vessels of left lower extremity: Secondary | ICD-10-CM | POA: Insufficient documentation

## 2018-04-21 DIAGNOSIS — M7989 Other specified soft tissue disorders: Secondary | ICD-10-CM | POA: Diagnosis not present

## 2018-04-21 NOTE — Patient Instructions (Addendum)
We need to to left lower ext [place] tonight. Please go downstairs now. They might be able to do study in one hour.   After study back will determine if treatment needed for dvt, superficial phlebitis. or infection.  In event you do have DVT I am giving sample tablets of xarelto to take 15 mg twice daily. But do not start unless I advise you to do so.  Please keep your phone charge and volume up.  Follow up later tonight after I am informed on results of study.  [phone]  Called pt later and notified of no dvt but superficial thrombophlebits. Explained to her and son to use  warm compresses twice daily and ibuprofen otc 400 mg tid. Also advised will send keflex antibiotic to pharmacy. Follow up Tuesday morning. If signs and symptoms worsen overweekend advise ED evaluation.

## 2018-04-21 NOTE — Progress Notes (Signed)
Pre visit review using our clinic review tool, if applicable. No additional management support is needed unless otherwise documented below in the visit note. 

## 2018-04-21 NOTE — Progress Notes (Signed)
Subjective:    Patient ID: Amber Morgan, female    DOB: 1927-03-23, 84 y.o.   MRN: [id number]  HPI  Pt had recent hernia surgery and breast cancer surgery(lumpectomy).  Pt states this Tuesday leg upper calf aspect got red and tender.   No sob or wheezing.  Pt has been active since surgery.   No fever, no chills or sweats.   Review of Systems  Constitutional: Negative for chills, fatigue and fever.  Respiratory: Negative for cough, chest tightness, shortness of breath and wheezing.   Cardiovascular: Negative for chest pain and palpitations.  Gastrointestinal: Negative for abdominal distention and abdominal pain.  Musculoskeletal: Negative for back pain and gait problem.       See hpi.  Skin:       See hpi.  Hematological: Negative for adenopathy. Does not bruise/bleed easily.  Psychiatric/Behavioral: Negative for behavioral problems, confusion and dysphoric mood.   Past Medical History:  Diagnosis Date  . Allergy   . Arthritis   . Cancer [facility])    breast cancer   . Colon polyps   . Diverticulitis   . Diverticulosis 01/04/2014  . Glaucoma   . Hernia, inguinal, right   . History of chicken pox    childhood  . History of hiatal hernia   . HTN (hypertension)   . Hyperglycemia 09/05/2013  . Lipoma of arm 12/09/2014   right  . Nocturia 02/08/2013  . Osteopenia 12/09/2014  . Personal history of colonic polyps 09/05/2013  . Pre-diabetes   . Preventative health care 08/14/2016  . Unspecified constipation 02/06/2013  . Urinary incontinence   . Viral infection characterized by skin and mucous membrane lesions 05/11/2013  . Wears glasses      Social History   Socioeconomic History  . Marital status: Married    Spouse name: Not on file  . Number of children: 4  . Years of education: Not on file  . Highest education level: Not on file  Occupational History  . Not on file  Social Needs  . Financial resource strain: Not on file  . Food insecurity:    Worry: Not on file   Inability: Not on file  . Transportation needs:    Medical: Not on file    Non-medical: Not on file  Tobacco Use  . Smoking status: Never Smoker  . Smokeless tobacco: Never Used  Substance and Sexual Activity  . Alcohol use: No  . Drug use: No  . Sexual activity: Not Currently  Lifestyle  . Physical activity:    Days per week: Not on file    Minutes per session: Not on file  . Stress: Not on file  Relationships  . Social connections:    Talks on phone: Not on file    Gets together: Not on file    Attends religious service: Not on file    Active member of club or organization: Not on file    Attends meetings of clubs or organizations: Not on file    Relationship status: Not on file  . Intimate partner violence:    Fear of current or ex partner: Not on file    Emotionally abused: Not on file    Physically abused: Not on file    Forced sexual activity: Not on file  Other Topics Concern  . Not on file  Social History Narrative  . Not on file    Past Surgical History:  Procedure Laterality Date  . ABDOMINAL HYSTERECTOMY    .  APPENDECTOMY    . BLADDER SURGERY  2003   bladder tact  . BREAST LUMPECTOMY WITH RADIOACTIVE SEED LOCALIZATION Right 04/12/2018   Procedure: BREAST LUMPECTOMY WITH RADIOACTIVE SEED LOCALIZATION;  Surgeon: [name], MD;  Location: [place];  Service: General;  Laterality: Right;  . CATARACT EXTRACTION, BILATERAL    . COLONOSCOPY W/ BIOPSIES AND POLYPECTOMY    . EYE SURGERY     multiple bilateral  . INGUINAL HERNIA REPAIR Right 04/12/2018   Procedure: OPEN RIGHT INGUINAL HERNIA REPAIR WITH MESH;  Surgeon: [name], MD;  Location: [place];  Service: General;  Laterality: Right;  . RECTOCELE REPAIR Bilateral     Family History  Problem Relation Age of Onset  . Colon cancer Mother   . Hypertension Mother   . Heart disease Father   . Meniere's disease Father   . Heart disease Sister   . Arthritis Son        back  . Glaucoma Son   . Hypertension  Son   . Cataracts Son   . Colon cancer Sister   . Alzheimer's disease Sister   . Glaucoma Sister   . Glaucoma Sister   . Hyperlipidemia Sister   . Hypertension Sister   . Cancer Sister        thyroid  . Diabetes Sister   . Glaucoma Sister     No Known Allergies  Current Outpatient Medications on File Prior to Visit  Medication Sig Dispense Refill  . acetaminophen (TYLENOL) 500 MG tablet Take 1-2 tablets (500-1,000 mg total) by mouth every 8 (eight) hours as needed for mild pain, moderate pain or headache (Take at least three times per day for the first 3 days post op.). 30 tablet 0  . amLODipine (NORVASC) 2.5 MG tablet Take 1 tablet (2.5 mg total) by mouth daily. 90 tablet 1  . Artificial Tear Solution (GENTEAL TEARS OP) Place 1 drop into both eyes 2 (two) times daily.    . Multiple Vitamins-Minerals (ONE-A-DAY WOMENS 50 PLUS PO) Take 1 each by mouth daily.    . potassium chloride SA (K-DUR,KLOR-CON) 20 MEQ tablet TAKE 1 TABLET BY MOUTH ONCE DAILY 90 tablet 1  . Probiotic Product (PROBIOTIC DAILY PO) Take 1 capsule by mouth daily.     . Fiber POWD Take 1 Scoop by mouth daily.    Marland Kitchen oxyCODONE (OXY IR/ROXICODONE) 5 MG immediate release tablet Take 0.5-1 tablets (2.5-5 mg total) by mouth every 6 (six) hours as needed for severe pain. (Patient not taking: Reported on 04/15/2018) 10 tablet 0  . polyethylene glycol (MIRALAX / GLYCOLAX) packet Take 17 g by mouth 2 (two) times daily.    Marland Kitchen triamterene-hydrochlorothiazide (MAXZIDE-25) 37.5-25 MG tablet TAKE 1 TABLET EVERY DAY (Patient not taking: Reported on 04/15/2018) 90 tablet 2   No current facility-administered medications on file prior to visit.     BP (!) 163/59 (BP Location: Right Arm, Patient Position: Sitting, Cuff Size: Small)   Pulse 80   Temp 98.2 F (36.8 C) (Oral)   Ht 4\' 11"  (1.499 m)   Wt 121 lb 6 oz (55.1 kg)   SpO2 100%   BMI 24.51 kg/m       Objective:   Physical Exam  General- No acute distress. Pleasant  patient. Neck- Full range of motion, no jvd Lungs- Clear, even and unlabored. Heart- regular rate and rhythm. Neurologic- CNII- XII grossly intact.  Left lower ext- calf symmetric compared to rt side. Negative homans sign. Medial upper calf 1.5x  1.5 cm area that is mild red, warm and faint  tender with some palpable tortuous mild varicose veins.       Assessment & Plan:  We need to to left lower ext [place] tonight. Please go downstairs now. They might be able to do study in one hour.   After study back will determine if treatment needed for dvt, superficial phlebitis. or infection.  In event you do have DVT I am giving sample tablets of xarelto to take 15 mg twice daily. But do not start unless I advise you to do so.  Please keep your phone charge and volume up.  Follow up later tonight after I am informed on results of study.  [phone]  Called pt later and notified of no dvt but superficial thrombophlebits. Explained to her and son to use  warm compresses twice daily and ibuprofen otc 400 mg tid. Also advised will send keflex antibiotic to pharmacy. Follow up Tuesday morning. If signs and symptoms worsen overweekend advise ED evaluation.  Mackie Pai, PA-C

## 2018-04-22 ENCOUNTER — Telehealth: Payer: Self-pay | Admitting: Medical

## 2018-04-22 ENCOUNTER — Telehealth: Payer: Self-pay | Admitting: Family Medicine

## 2018-04-22 MED ORDER — CEPHALEXIN 500 MG PO CAPS: 500.0000 mg | ORAL_CAPSULE | Freq: Two times a day (BID) | ORAL | 0 refills | Status: DC

## 2018-04-22 NOTE — Telephone Encounter (Signed)
Please advise 

## 2018-04-22 NOTE — Telephone Encounter (Signed)
Copied from [place] [id number]. Topic: General - Other >> Apr 22, 2018  6:42 AM Lennox Solders wrote: Reason for CRM:pt is taking diuretic bp med and per pt its said to consult your doctor if take ibuprofen. Please advise. Pt saw edward yesterday

## 2018-04-22 NOTE — Telephone Encounter (Signed)
Sent in rx keflex.

## 2018-04-24 NOTE — Telephone Encounter (Signed)
Please see notes.

## 2018-04-24 NOTE — Telephone Encounter (Signed)
Please see my previous note connected to this phone note. Not sure it routed to you

## 2018-04-24 NOTE — Telephone Encounter (Signed)
So no pain meds are completely safe at her age. Tylenol is the safest. Try ES 500 mg tabs 1 tab po bid regularly. It works better if taken regularly and can be increased to 2 tabs 3 x daily as needed. If this were inadequate could add Ibuprofen but would not do more than 200 mg tabs, 1 tab po tid prn pain with plenty to drink and always eating food with it.

## 2018-04-25 NOTE — Telephone Encounter (Signed)
Called patient left message for patient to call the office  

## 2018-04-26 ENCOUNTER — Encounter: Payer: Self-pay | Admitting: Medical

## 2018-04-26 ENCOUNTER — Ambulatory Visit (INDEPENDENT_AMBULATORY_CARE_PROVIDER_SITE_OTHER): Payer: Medicare HMO | Admitting: Medical

## 2018-04-26 VITALS — BP 158/52 | Temp 97.9°F | Ht 59.0 in | Wt 118.6 lb

## 2018-04-26 DIAGNOSIS — I809 Phlebitis and thrombophlebitis of unspecified site: Secondary | ICD-10-CM

## 2018-04-26 DIAGNOSIS — L089 Local infection of the skin and subcutaneous tissue, unspecified: Secondary | ICD-10-CM | POA: Diagnosis not present

## 2018-04-26 MED ORDER — DOXYCYCLINE HYCLATE 100 MG PO TABS: 100.0000 mg | ORAL_TABLET | Freq: Two times a day (BID) | ORAL | 0 refills | Status: DC

## 2018-04-26 NOTE — Progress Notes (Signed)
Subjective:    Patient ID: Amber Morgan, female    DOB: [birth year], 84 y.o.   MRN: [id number]  HPI  Pt in for for follow up.  Pt left calf feel better. No fever, no chills or sweats.   Pt has started the antibiotic. Pt has been doing the warm compresses twice daily.  Center area of phlebitis is still moderate indurated but less tender overall. Less red and less warm.   US showed superficial thrombophlebitis but not dvt.      Review of Systems  Constitutional: Negative for chills, fatigue and fever.  Respiratory: Negative for cough, chest tightness, shortness of breath and wheezing.   Cardiovascular: Negative for chest pain and palpitations.  Musculoskeletal:       See hpi.  Skin:       See hpi and physcial exam.  Hematological: Negative for adenopathy. Does not bruise/bleed easily.  Psychiatric/Behavioral: Negative for behavioral problems and decreased concentration. The patient is not nervous/anxious.    Past Medical History:  Diagnosis Date  . Allergy   . Arthritis   . Cancer [facility])    breast cancer   . Colon polyps   . Diverticulitis   . Diverticulosis 01/04/2014  . Glaucoma   . Hernia, inguinal, right   . History of chicken pox    childhood  . History of hiatal hernia   . HTN (hypertension)   . Hyperglycemia 09/05/2013  . Lipoma of arm 12/09/2014   right  . Nocturia 02/08/2013  . Osteopenia 12/09/2014  . Personal history of colonic polyps 09/05/2013  . Pre-diabetes   . Preventative health care 08/14/2016  . Unspecified constipation 02/06/2013  . Urinary incontinence   . Viral infection characterized by skin and mucous membrane lesions 05/11/2013  . Wears glasses      Social History   Socioeconomic History  . Marital status: Married    Spouse name: Not on file  . Number of children: 4  . Years of education: Not on file  . Highest education level: Not on file  Occupational History  . Not on file  Social Needs  . Financial resource strain: Not on file  .  Food insecurity:    Worry: Not on file    Inability: Not on file  . Transportation needs:    Medical: Not on file    Non-medical: Not on file  Tobacco Use  . Smoking status: Never Smoker  . Smokeless tobacco: Never Used  Substance and Sexual Activity  . Alcohol use: No  . Drug use: No  . Sexual activity: Not Currently  Lifestyle  . Physical activity:    Days per week: Not on file    Minutes per session: Not on file  . Stress: Not on file  Relationships  . Social connections:    Talks on phone: Not on file    Gets together: Not on file    Attends religious service: Not on file    Active member of club or organization: Not on file    Attends meetings of clubs or organizations: Not on file    Relationship status: Not on file  . Intimate partner violence:    Fear of current or ex partner: Not on file    Emotionally abused: Not on file    Physically abused: Not on file    Forced sexual activity: Not on file  Other Topics Concern  . Not on file  Social History Narrative  . Not on file  Past Surgical History:  Procedure Laterality Date  . ABDOMINAL HYSTERECTOMY    . APPENDECTOMY    . BLADDER SURGERY  2003   bladder tact  . BREAST LUMPECTOMY WITH RADIOACTIVE SEED LOCALIZATION Right 04/12/2018   Procedure: BREAST LUMPECTOMY WITH RADIOACTIVE SEED LOCALIZATION;  Surgeon: [name], MD;  Location: [place];  Service: General;  Laterality: Right;  . CATARACT EXTRACTION, BILATERAL    . COLONOSCOPY W/ BIOPSIES AND POLYPECTOMY    . EYE SURGERY     multiple bilateral  . INGUINAL HERNIA REPAIR Right 04/12/2018   Procedure: OPEN RIGHT INGUINAL HERNIA REPAIR WITH MESH;  Surgeon: [name], MD;  Location: [place];  Service: General;  Laterality: Right;  . RECTOCELE REPAIR Bilateral     Family History  Problem Relation Age of Onset  . Colon cancer Mother   . Hypertension Mother   . Heart disease Father   . Meniere's disease Father   . Heart disease Sister   . Arthritis Son         back  . Glaucoma Son   . Hypertension Son   . Cataracts Son   . Colon cancer Sister   . Alzheimer's disease Sister   . Glaucoma Sister   . Glaucoma Sister   . Hyperlipidemia Sister   . Hypertension Sister   . Cancer Sister        thyroid  . Diabetes Sister   . Glaucoma Sister     No Known Allergies  Current Outpatient Medications on File Prior to Visit  Medication Sig Dispense Refill  . amLODipine (NORVASC) 2.5 MG tablet Take 1 tablet (2.5 mg total) by mouth daily. 90 tablet 1  . Artificial Tear Solution (GENTEAL TEARS OP) Place 1 drop into both eyes 2 (two) times daily.    . cephALEXin (KEFLEX) 500 MG capsule Take 1 capsule (500 mg total) by mouth 2 (two) times daily. 14 capsule 0  . Fiber POWD Take 1 Scoop by mouth daily.    Marland Kitchen ibuprofen (ADVIL,MOTRIN) 200 MG tablet Take 200 mg by mouth every 6 (six) hours as needed.    . Multiple Vitamins-Minerals (ONE-A-DAY WOMENS 50 PLUS PO) Take 1 each by mouth daily.    . polyethylene glycol (MIRALAX / GLYCOLAX) packet Take 17 g by mouth 2 (two) times daily.    . potassium chloride SA (K-DUR,KLOR-CON) 20 MEQ tablet TAKE 1 TABLET BY MOUTH ONCE DAILY 90 tablet 1  . Probiotic Product (PROBIOTIC DAILY PO) Take 1 capsule by mouth daily.     Marland Kitchen triamterene-hydrochlorothiazide (MAXZIDE-25) 37.5-25 MG tablet TAKE 1 TABLET EVERY DAY 90 tablet 2   No current facility-administered medications on file prior to visit.     BP (!) 158/52 (BP Location: Left Arm, Patient Position: Sitting, Cuff Size: Normal)   Temp 97.9 F (36.6 C) (Oral)   Wt 118 lb 9.6 oz (53.8 kg)   BMI 23.95 kg/m       Objective:   Physical Exam  .General- No acute distress. Pleasant patient. Neck- Full range of motion, no jvd Lungs- Clear, even and unlabored. Heart- regular rate and rhythm. Neurologic- CNII- XII grossly intact.  Left lower ext- calf symmetric compared to rt side. Negative homans sign. Medial upper calf 1.5x 1.5 cm area that is less red,  Less warm  and faint  tender with less  palpable tortuous varicose veins. But mid area still feel indurated and on palpation indurated portion feel moderate deep.       Assessment & Plan:  I do think the phlebitis has improved since last visit.  However some features on palpation in the mid center of red area causes concern for skin infection/cellulitis.  Keflex is a good antibiotic but I think doxycycline is better antibiotic.  So please stop Keflex and prescribing doxycycline to take twice daily.  Rx advised and given with Doxy.  I asked that she schedule appointment for Friday morning around 8AM.  We will recheck your leg and decide if we will get ultrasound before the weekend.  Follow-up Friday or as needed.  Mackie Pai, PA-C

## 2018-04-26 NOTE — Patient Instructions (Signed)
I do think the phlebitis has improved since last visit.  However some features on palpation in the mid center of red area causes concern for skin infection/cellulitis.  Keflex is a good antibiotic but I think doxycycline is better antibiotic.  So please stop Keflex and prescribing doxycycline to take twice daily.  Rx advised and given with Doxy.  I asked that she schedule appointment for Friday morning around 8AM.  We will recheck your leg and decide if we will get ultrasound before the weekend.  Follow-up Friday or as needed.

## 2018-04-29 ENCOUNTER — Ambulatory Visit (INDEPENDENT_AMBULATORY_CARE_PROVIDER_SITE_OTHER): Payer: Medicare HMO | Admitting: Medical

## 2018-04-29 ENCOUNTER — Encounter: Payer: Self-pay | Admitting: Medical

## 2018-04-29 ENCOUNTER — Ambulatory Visit (HOSPITAL_BASED_OUTPATIENT_CLINIC_OR_DEPARTMENT_OTHER)
Admission: RE | Admit: 2018-04-29 | Discharge: 2018-04-29 | Disposition: A | Discharge status: Home / Self Care | Payer: Medicare HMO | Source: Ambulatory Visit | Attending: Medical | Admitting: Medical

## 2018-04-29 ENCOUNTER — Other Ambulatory Visit: Payer: Self-pay | Admitting: Medical

## 2018-04-29 VITALS — BP 150/47 | HR 71 | Temp 97.8°F | Resp 16 | Ht 59.0 in | Wt 119.2 lb

## 2018-04-29 DIAGNOSIS — I8001 Phlebitis and thrombophlebitis of superficial vessels of right lower extremity: Secondary | ICD-10-CM | POA: Diagnosis not present

## 2018-04-29 DIAGNOSIS — L089 Local infection of the skin and subcutaneous tissue, unspecified: Secondary | ICD-10-CM | POA: Diagnosis not present

## 2018-04-29 DIAGNOSIS — I8002 Phlebitis and thrombophlebitis of superficial vessels of left lower extremity: Secondary | ICD-10-CM | POA: Insufficient documentation

## 2018-04-29 DIAGNOSIS — M79652 Pain in left thigh: Secondary | ICD-10-CM | POA: Insufficient documentation

## 2018-04-29 DIAGNOSIS — I809 Phlebitis and thrombophlebitis of unspecified site: Secondary | ICD-10-CM | POA: Diagnosis not present

## 2018-04-29 MED ORDER — DOXYCYCLINE HYCLATE 100 MG PO TABS: 100.0000 mg | ORAL_TABLET | Freq: Two times a day (BID) | ORAL | 0 refills | Status: DC

## 2018-04-29 NOTE — Progress Notes (Signed)
Subjective:    Patient ID: Amber Morgan, female    DOB: [birth year], 84 y.o.   MRN: [id number]  HPI  Pt here for follow up on superficial phlebitis and possible skin infection. Want to assess area after starting the doxycycline. See if area is improved and see if might need to repeat rt lower ext [place].   Pt area on leg looks less red and lighter. But she points slight tenderness up her medial aspect thigh. She is not sure if this area is new.   Review of Systems  Constitutional: Negative for chills, fatigue and fever.  Respiratory: Negative for cough, chest tightness, shortness of breath and wheezing.   Cardiovascular: Negative for chest pain and palpitations.  Gastrointestinal: Negative for abdominal pain.  Musculoskeletal:       See hpi.  Skin:       See hpi.  Neurological: Negative for dizziness, seizures, weakness and headaches.  Hematological: Negative for adenopathy. Does not bruise/bleed easily.  Psychiatric/Behavioral: Negative for behavioral problems and confusion.    Past Medical History:  Diagnosis Date  . Allergy   . Arthritis   . Cancer [facility])    breast cancer   . Colon polyps   . Diverticulitis   . Diverticulosis 01/04/2014  . Glaucoma   . Hernia, inguinal, right   . History of chicken pox    childhood  . History of hiatal hernia   . HTN (hypertension)   . Hyperglycemia 09/05/2013  . Lipoma of arm 12/09/2014   right  . Nocturia 02/08/2013  . Osteopenia 12/09/2014  . Personal history of colonic polyps 09/05/2013  . Pre-diabetes   . Preventative health care 08/14/2016  . Unspecified constipation 02/06/2013  . Urinary incontinence   . Viral infection characterized by skin and mucous membrane lesions 05/11/2013  . Wears glasses      Social History   Socioeconomic History  . Marital status: Married    Spouse name: Not on file  . Number of children: 4  . Years of education: Not on file  . Highest education level: Not on file  Occupational History  . Not on  file  Social Needs  . Financial resource strain: Not on file  . Food insecurity:    Worry: Not on file    Inability: Not on file  . Transportation needs:    Medical: Not on file    Non-medical: Not on file  Tobacco Use  . Smoking status: Never Smoker  . Smokeless tobacco: Never Used  Substance and Sexual Activity  . Alcohol use: No  . Drug use: No  . Sexual activity: Not Currently  Lifestyle  . Physical activity:    Days per week: Not on file    Minutes per session: Not on file  . Stress: Not on file  Relationships  . Social connections:    Talks on phone: Not on file    Gets together: Not on file    Attends religious service: Not on file    Active member of club or organization: Not on file    Attends meetings of clubs or organizations: Not on file    Relationship status: Not on file  . Intimate partner violence:    Fear of current or ex partner: Not on file    Emotionally abused: Not on file    Physically abused: Not on file    Forced sexual activity: Not on file  Other Topics Concern  . Not on file  Social History  Narrative  . Not on file    Past Surgical History:  Procedure Laterality Date  . ABDOMINAL HYSTERECTOMY    . APPENDECTOMY    . BLADDER SURGERY  2003   bladder tact  . BREAST LUMPECTOMY WITH RADIOACTIVE SEED LOCALIZATION Right 04/12/2018   Procedure: BREAST LUMPECTOMY WITH RADIOACTIVE SEED LOCALIZATION;  Surgeon: [name], MD;  Location: [place];  Service: General;  Laterality: Right;  . CATARACT EXTRACTION, BILATERAL    . COLONOSCOPY W/ BIOPSIES AND POLYPECTOMY    . EYE SURGERY     multiple bilateral  . INGUINAL HERNIA REPAIR Right 04/12/2018   Procedure: OPEN RIGHT INGUINAL HERNIA REPAIR WITH MESH;  Surgeon: [name], MD;  Location: [place];  Service: General;  Laterality: Right;  . RECTOCELE REPAIR Bilateral     Family History  Problem Relation Age of Onset  . Colon cancer Mother   . Hypertension Mother   . Heart disease Father   .  Meniere's disease Father   . Heart disease Sister   . Arthritis Son        back  . Glaucoma Son   . Hypertension Son   . Cataracts Son   . Colon cancer Sister   . Alzheimer's disease Sister   . Glaucoma Sister   . Glaucoma Sister   . Hyperlipidemia Sister   . Hypertension Sister   . Cancer Sister        thyroid  . Diabetes Sister   . Glaucoma Sister     No Known Allergies  Current Outpatient Medications on File Prior to Visit  Medication Sig Dispense Refill  . amLODipine (NORVASC) 2.5 MG tablet Take 1 tablet (2.5 mg total) by mouth daily. 90 tablet 1  . Artificial Tear Solution (GENTEAL TEARS OP) Place 1 drop into both eyes 2 (two) times daily.    . Fiber POWD Take 1 Scoop by mouth daily.    Marland Kitchen ibuprofen (ADVIL,MOTRIN) 200 MG tablet Take 200 mg by mouth every 6 (six) hours as needed.    . Multiple Vitamins-Minerals (ONE-A-DAY WOMENS 50 PLUS PO) Take 1 each by mouth daily.    . polyethylene glycol (MIRALAX / GLYCOLAX) packet Take 17 g by mouth 2 (two) times daily.    . potassium chloride SA (K-DUR,KLOR-CON) 20 MEQ tablet TAKE 1 TABLET BY MOUTH ONCE DAILY 90 tablet 1  . Probiotic Product (PROBIOTIC DAILY PO) Take 1 capsule by mouth daily.     Marland Kitchen triamterene-hydrochlorothiazide (MAXZIDE-25) 37.5-25 MG tablet TAKE 1 TABLET EVERY DAY 90 tablet 2   No current facility-administered medications on file prior to visit.     BP (!) 150/47   Pulse 71   Temp 97.8 F (36.6 C) (Oral)   Resp 16   Ht 4\' 11"  (1.499 m)   Wt 119 lb 3.2 oz (54.1 kg)   SpO2 100%   BMI 24.08 kg/m       Objective:   Physical Exam  .General- No acute distress. Pleasant patient. . Leftlower ext- calf symmetric compared to rt side. Negative homans sign. Medial upper calf 1.5x 1.5 cm area that is not red now but hyperpigmented,  Not warm but  faint tender with less  palpable tortuous varicose veins. But mid area still feel indurated and on palpation indurated portion feel moderate deep.         Assessment & Plan:  The area of phlebitis is still present by physical exam.  Continue warm compresses twice daily to the area and ibuprofen 3 times  daily.  The apparent skin infection or region appears to be better with doxycycline.  Redness has decreased.  Will provide 3 more days of doxycycline.  New medial thigh pain that might represent superficial phlebitis.  Want to repeat ultrasound assess previous area of superficial phlebitis and see if upper thigh area has superficial phlebitis versus possible DVT?  Follow-up date to be determined after review of ultrasound report.  Mackie Pai, PA-C

## 2018-04-29 NOTE — Patient Instructions (Signed)
The area of phlebitis is still present by physical exam.  Continue warm compresses twice daily to the area and ibuprofen 3 times daily.  The apparent skin infection or region appears to be better with doxycycline.  Redness has decreased.  Will provide 3 more days of doxycycline.  New medial thigh pain that might represent superficial phlebitis.  Want to repeat ultrasound assess previous area of superficial phlebitis and see if upper thigh area has superficial phlebitis versus possible DVT?  Follow-up date to be determined after review of ultrasound report.

## 2018-05-02 ENCOUNTER — Telehealth: Payer: Self-pay | Admitting: Family Medicine

## 2018-05-02 NOTE — Progress Notes (Deleted)
HPI: FU HTN and bradycardia. Noted to be bradycardic with heart rate in the 40s previously and atenolol discontinued. Amlodipine added. Carotid Dopplers June 2017 negative. Venous ultrasound July 2019 showed no DVT left lower extremity.  There was occlusive superficial thrombophlebitis involving a varicosity in the medial aspect of the left calf.  Since last seen,   Current Outpatient Medications  Medication Sig Dispense Refill  . amLODipine (NORVASC) 2.5 MG tablet Take 1 tablet (2.5 mg total) by mouth daily. 90 tablet 1  . Artificial Tear Solution (GENTEAL TEARS OP) Place 1 drop into both eyes 2 (two) times daily.    Marland Kitchen doxycycline (VIBRA-TABS) 100 MG tablet Take 1 tablet (100 mg total) by mouth 2 (two) times daily. Can give capsules or generic 6 tablet 0  . Fiber POWD Take 1 Scoop by mouth daily.    Marland Kitchen ibuprofen (ADVIL,MOTRIN) 200 MG tablet Take 200 mg by mouth every 6 (six) hours as needed.    . Multiple Vitamins-Minerals (ONE-A-DAY WOMENS 50 PLUS PO) Take 1 each by mouth daily.    . polyethylene glycol (MIRALAX / GLYCOLAX) packet Take 17 g by mouth 2 (two) times daily.    . potassium chloride SA (K-DUR,KLOR-CON) 20 MEQ tablet TAKE 1 TABLET BY MOUTH ONCE DAILY 90 tablet 1  . Probiotic Product (PROBIOTIC DAILY PO) Take 1 capsule by mouth daily.     Marland Kitchen triamterene-hydrochlorothiazide (MAXZIDE-25) 37.5-25 MG tablet TAKE 1 TABLET EVERY DAY 90 tablet 2   No current facility-administered medications for this visit.      Past Medical History:  Diagnosis Date  . Allergy   . Arthritis   . Cancer [facility])    breast cancer   . Colon polyps   . Diverticulitis   . Diverticulosis 01/04/2014  . Glaucoma   . Hernia, inguinal, right   . History of chicken pox    childhood  . History of hiatal hernia   . HTN (hypertension)   . Hyperglycemia 09/05/2013  . Lipoma of arm 12/09/2014   right  . Nocturia 02/08/2013  . Osteopenia 12/09/2014  . Personal history of colonic polyps 09/05/2013  . Pre-diabetes    . Preventative health care 08/14/2016  . Unspecified constipation 02/06/2013  . Urinary incontinence   . Viral infection characterized by skin and mucous membrane lesions 05/11/2013  . Wears glasses     Past Surgical History:  Procedure Laterality Date  . ABDOMINAL HYSTERECTOMY    . APPENDECTOMY    . BLADDER SURGERY  2003   bladder tact  . BREAST LUMPECTOMY WITH RADIOACTIVE SEED LOCALIZATION Right 04/12/2018   Procedure: BREAST LUMPECTOMY WITH RADIOACTIVE SEED LOCALIZATION;  Surgeon: [name], MD;  Location: [place];  Service: General;  Laterality: Right;  . CATARACT EXTRACTION, BILATERAL    . COLONOSCOPY W/ BIOPSIES AND POLYPECTOMY    . EYE SURGERY     multiple bilateral  . INGUINAL HERNIA REPAIR Right 04/12/2018   Procedure: OPEN RIGHT INGUINAL HERNIA REPAIR WITH MESH;  Surgeon: [name], MD;  Location: [place];  Service: General;  Laterality: Right;  . RECTOCELE REPAIR Bilateral     Social History   Socioeconomic History  . Marital status: Married    Spouse name: Not on file  . Number of children: 4  . Years of education: Not on file  . Highest education level: Not on file  Occupational History  . Not on file  Social Needs  . Financial resource strain: Not on file  . Food  insecurity:    Worry: Not on file    Inability: Not on file  . Transportation needs:    Medical: Not on file    Non-medical: Not on file  Tobacco Use  . Smoking status: Never Smoker  . Smokeless tobacco: Never Used  Substance and Sexual Activity  . Alcohol use: No  . Drug use: No  . Sexual activity: Not Currently  Lifestyle  . Physical activity:    Days per week: Not on file    Minutes per session: Not on file  . Stress: Not on file  Relationships  . Social connections:    Talks on phone: Not on file    Gets together: Not on file    Attends religious service: Not on file    Active member of club or organization: Not on file    Attends meetings of clubs or organizations: Not on file     Relationship status: Not on file  . Intimate partner violence:    Fear of current or ex partner: Not on file    Emotionally abused: Not on file    Physically abused: Not on file    Forced sexual activity: Not on file  Other Topics Concern  . Not on file  Social History Narrative  . Not on file    Family History  Problem Relation Age of Onset  . Colon cancer Mother   . Hypertension Mother   . Heart disease Father   . Meniere's disease Father   . Heart disease Sister   . Arthritis Son        back  . Glaucoma Son   . Hypertension Son   . Cataracts Son   . Colon cancer Sister   . Alzheimer's disease Sister   . Glaucoma Sister   . Glaucoma Sister   . Hyperlipidemia Sister   . Hypertension Sister   . Cancer Sister        thyroid  . Diabetes Sister   . Glaucoma Sister     ROS: no fevers or chills, productive cough, hemoptysis, dysphasia, odynophagia, melena, hematochezia, dysuria, hematuria, rash, seizure activity, orthopnea, PND, pedal edema, claudication. Remaining systems are negative.  Physical Exam: Well-developed well-nourished in no acute distress.  Skin is warm and dry.  HEENT is normal.  Neck is supple.  Chest is clear to auscultation with normal expansion.  Cardiovascular exam is regular rate and rhythm.  Abdominal exam nontender or distended. No masses palpated. Extremities show no edema. neuro grossly intact  ECG- personally reviewed  A/P  1  Kirk Ruths, MD

## 2018-05-02 NOTE — Telephone Encounter (Signed)
Copied from [place] [id number]. Topic: Quick Communication - See Telephone Encounter >> May 02, 2018  7:11 AM Burchel, Abbi R wrote: CRM for notification. See Telephone encounter for: 05/02/18.  Pt states she was instructed by Dr Harvie Heck to call and let him know how her leg was on Monday.  Pt states she still has a hard knot on left leg, but is it smaller and there is no redness/tenderness.  Pt would like Dr Richardine Service nurse to call with any further instructions.    Pt: [phone]

## 2018-05-02 NOTE — Telephone Encounter (Signed)
Since pt is improving gradually recommend she continue the warm compresses twice daily and low dose ibuprofen. Update [place] again in another week. As long progressively getting better then I don't think will need to refer to vascular specialist.  If

## 2018-05-03 ENCOUNTER — Ambulatory Visit: Payer: Self-pay

## 2018-05-03 NOTE — Telephone Encounter (Signed)
I don't remember refill of doxycycline  3rd time. Looks like only gave 3 day supply on last visit. Based on patient report, I don't think she needs further doxycycline presently. I am not familiar with doxcycline and vision changes. Sounds like vision is stable. Can see pt tomorrow as scheduled. If any acute vision changes during interim recommend ED evaluation.

## 2018-05-03 NOTE — Telephone Encounter (Signed)
Pt. Called with concern about a specific side effect of Doxycycline;  she has read it can cause permanent vision loss. Stated she has taken 2 Rx's of it, and was notified that a 3rd refill is at the pharmacy.   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  Also reported she has "a little vision loss in the right eye."  Denied any change from her baseline vision of the right eye.  Questioned if the Doxycycline is safe to take?  Noted that a 3rd order was  Placed on 05/02/18; stated she was notified by the pharmacy about this today.  Stated she would like to schedule an appt. with Ed Saguier. Appt. Scheduled for 11:20 7/31.  Advised will send Triage note to Ed Saguier with pt's concern about side effect from Doxycycline, that can affect her vision.  Advised will check if pt. is to take another Rx of the Doxycycline.  Suggested that she wait to pick this up, until she has further direction to do so.  Pt. Verb. Understanding.  Agrees with plan.        Reason for Disposition . Pharmacy calling with prescription questions and triager unable to answer question  Answer Assessment - Initial Assessment Questions 1. SYMPTOMS: "Do you have any symptoms?"     Reported she read that Doxycycline can cause loss of vision and this can be permanent, even after the medication is finished.   2. SEVERITY: If symptoms are present, ask "Are they mild, moderate or severe?"     Denied any noticeable changes in right eye vision since taking Doxycycline.; reported has had loss of vision in left eye for 15 years  Protocols used: MEDICATION QUESTION CALL-A-AH

## 2018-05-04 ENCOUNTER — Encounter: Payer: Self-pay | Admitting: Medical

## 2018-05-04 ENCOUNTER — Ambulatory Visit (INDEPENDENT_AMBULATORY_CARE_PROVIDER_SITE_OTHER): Payer: Medicare HMO | Admitting: Medical

## 2018-05-04 ENCOUNTER — Ambulatory Visit: Payer: Medicare HMO | Admitting: Cardiology

## 2018-05-04 ENCOUNTER — Telehealth: Payer: Self-pay | Admitting: Medical

## 2018-05-04 VITALS — BP 140/59 | HR 69 | Temp 98.0°F | Ht 59.0 in | Wt 116.4 lb

## 2018-05-04 DIAGNOSIS — T7840XA Allergy, unspecified, initial encounter: Secondary | ICD-10-CM

## 2018-05-04 DIAGNOSIS — I809 Phlebitis and thrombophlebitis of unspecified site: Secondary | ICD-10-CM | POA: Diagnosis not present

## 2018-05-04 NOTE — Telephone Encounter (Signed)
Would you put doxycycline under her allergy list.  But please specify that it is possible allergy.  Slight rash 2 days after finished use.

## 2018-05-04 NOTE — Progress Notes (Signed)
   Subjective:    Patient ID: [name], female    DOB: [birth year], 84 y.o.   MRN: [id number]  HPI  Pt in states faint mild rash to [place] with mild itch. Noticed it Sunday evening or Monday morning. Only faint itch. No wheezing or sob.  She states that the rash is getting better.  She does not want any treatment.  She reports no shortness of breath, no wheezing and states itching is not keeping her up.  She speculates may be it could be related to doxycycline.  On review no particular other suspicious exposures.  The area on her leg feels a lot better.  She reports that the size of the area is diminished and is not hard.  No extension of former superficial phlebitis area.  No popliteal pain.  No shortness of breath.  Last ultrasound did not show any extension no superficial phlebitis and no DVT.  She has continued with the warm compresses twice daily and ibuprofen.     Review of Systems  Constitutional: Negative for diaphoresis, fatigue and fever.  HENT: Negative for ear pain.   Respiratory: Negative for choking, shortness of breath, wheezing and stridor.   Cardiovascular: Negative for chest pain and palpitations.  Musculoskeletal: Negative for arthralgias, back pain, gait problem and neck stiffness.       See HPI and physical exam.  Skin: Positive for rash.  Neurological: Negative for dizziness and light-headedness.  Hematological: Negative for adenopathy. Does not bruise/bleed easily.  Psychiatric/Behavioral: Negative for behavioral problems, confusion and dysphoric mood. The patient is not nervous/anxious.        Objective:   Physical Exam  General- No acute distress. Pleasant patient. Neck- Full range of motion, no jvd Lungs- Clear, even and unlabored. Heart- regular rate and rhythm. Neurologic- CNII- XII grossly intact.  Left lower extremity- calf not swollen.  Negative Homans sign.  Upper medial calf region superficial phlebitis looks much improved.  There is no  longer indurated.  The width of the previously red area is decreased by about 50%.  In this area also now has pinkish appearance.  The vein is no longer look tortuous/less prominent veins now.  Skin- on both forearms 4-5 small areas of pinkish-red rash.  This is barely noticeable.  There is no vesicular eruption.     Assessment & Plan:  The area on your left lower extremity is improving slowly.  Prior ultrasound did not show any extension of the superficial phlebitis and there was no DVT.  No further need for antibiotic as at one point appeared to be possibly secondary infected but not the case presently.  In fact now I think he can also stop ibuprofen.  Continue warm compresses twice daily, elevate your leg twice daily and use compression stockings.  You have very minimal area rash to her forearms.  Minimal itch.  You declined low-dose taper prednisone and prescription Kenalog.  If you feel that the rash is persisting and want to try hydrocortisone over-the-counter and you could apply to area twice daily.  For itching there is moderate to severe you could use low-dose Benadryl over-the-counter.  I am going to ask medical assistant to designate doxycycline as possible allergy.  It is unclear at this point if that was potential cause of rash.  Follow-up as needed.  I think the superficial phlebitis region will improve in approximate 10 to 14 days.  If area worsens please notify us.  Mackie Pai, PA-C

## 2018-05-04 NOTE — Patient Instructions (Signed)
The area on your left lower extremity is improving slowly.  Prior ultrasound did not show any extension of the superficial phlebitis and there was no DVT.  No further need for antibiotic as at one point appeared to be possibly secondary infected but not the case presently.  In fact now I think he can also stop ibuprofen.  Continue warm compresses twice daily, elevate your leg twice daily and use compression stockings.  You have very minimal area rash to her forearms.  Minimal itch.  You declined low-dose taper prednisone and prescription Kenalog.  If you feel that the rash is persisting and want to try hydrocortisone over-the-counter and you could apply to area twice daily.  For itching there is moderate to severe you could use low-dose Benadryl over-the-counter.  I am going to ask medical assistant to designate doxycycline as possible allergy.  It is unclear at this point if that was potential cause of rash.  Follow-up as needed.  I think the superficial phlebitis region will improve in approximate 10 to 14 days.  If area worsens please notify us.

## 2018-05-10 ENCOUNTER — Telehealth: Payer: Self-pay | Admitting: Oncology

## 2018-05-10 NOTE — Telephone Encounter (Signed)
Returned call to patient re message left my son Amber Morgan wanting to confirm next appointment. Not able to reach Amber Morgan at number provided ([phone]) - left message confirming appointment and also called and spoke with patient confirming 8/16 appointment,

## 2018-05-19 NOTE — Progress Notes (Signed)
[facility]  Telephone:(336) [phone] Fax:(336) [phone]     ID: Morgan Morgan DOB: Nov 30, 1926  MR#: [id number]  DUK#:[id number]  Patient Care Team: Morgan Lukes, MD as PCP - General (Family Medicine) Morgan Pain Mingo Amber, DO as Consulting Physician (Osteopathic Medicine) Amber Lipa, MD as Consulting Physician (Gastroenterology) Morgan Morgan Morgan Bors, MD as Consulting Physician (Cardiology) Amber Klein, MD as Consulting Physician (General Surgery) Morgan Morgan, Morgan Dad, MD as Consulting Physician (Oncology) Amber Gibson, MD as Attending Physician (Radiation Oncology) OTHER MD:  CHIEF COMPLAINT: Estrogen receptor positive lobular breast cancer  CURRENT TREATMENT: Anastrozole   HISTORY OF CURRENT ILLNESS: From the original intake note:  Morgan Morgan had routine screening mammography on 03/03/2018 showing a possible abnormality in the right breast. She underwent unilateral right diagnostic mammography with tomography and right breast ultrasonography at [facility] on 03/14/2018 showing: breast density category C. In the right breast, at the 1 o'clock upper inner quadrant there is a hypoechoic mass measuring 0.9 x 0.9 x 0.7 cm located 5 cm from the nipple. Ultrasonography revealed no evidence of lymphadenopathy in the right axilla.   Accordingly on 03/18/2018 she proceeded to biopsy of the right breast area in question. The pathology from this procedure showed ([id number]): Invasive lobular carcinoma grade II, spanning 0.6 cm. Prognostic indicators significant for: estrogen receptor, 100% positive and progesterone receptor, 100% positive, both with strong staining intensity. Proliferation marker Ki67 at 2%. HER2 not amplified with ratios HER2/CEP17 signals 1.62 and average HER2 copies per cell 3.80  The patient's subsequent history is as detailed below.  INTERVAL HISTORY: Morgan Morgan returns today for follow up and treatment of her estrogen receptor positive lobular  breast cancer accompanied by her son, Morgan Morgan. She tolerated her lumpectomy well, with no complications, specifically fever, pain, or bleeding.   She underwent right lumpectomy without sentinel lymph node sampling ([id number]) on 04/12/2018 with pathology showing: Invasive lobular carcinoma grade II, spanning 1.1 cm. Margins were negative for carcinoma.   REVIEW OF SYSTEMS: Morgan Morgan reports that has superficial phlebitis on the left lower leg.   She notes that she is not currently taking aspirin. She followed up with [facility] She denies unusual headaches, visual changes, nausea, vomiting, or dizziness. There has been no unusual cough, phlegm production, or pleurisy. There has been no change in bowel or bladder habits. She denies unexplained fatigue or unexplained weight loss, bleeding, rash, or fever. A detailed review of systems was otherwise stable.    PAST MEDICAL HISTORY: Past Medical History:  Diagnosis Date  . Allergy   . Arthritis   . Cancer [facility])    breast cancer   . Colon polyps   . Diverticulitis   . Diverticulosis 01/04/2014  . Glaucoma   . Hernia, inguinal, right   . History of chicken pox    childhood  . History of hiatal hernia   . HTN (hypertension)   . Hyperglycemia 09/05/2013  . Lipoma of arm 12/09/2014   right  . Nocturia 02/08/2013  . Osteopenia 12/09/2014  . Personal history of colonic polyps 09/05/2013  . Pre-diabetes   . Preventative health care 08/14/2016  . Unspecified constipation 02/06/2013  . Urinary incontinence   . Viral infection characterized by skin and mucous membrane lesions 05/11/2013  . Wears glasses   Glaucoma  PAST SURGICAL HISTORY: Past Surgical History:  Procedure Laterality Date  . ABDOMINAL HYSTERECTOMY    . APPENDECTOMY    . BLADDER SURGERY  2003   bladder tact  . BREAST  LUMPECTOMY WITH RADIOACTIVE SEED LOCALIZATION Right 04/12/2018   Procedure: BREAST LUMPECTOMY WITH RADIOACTIVE SEED LOCALIZATION;  Surgeon: Byerly, Faera, MD;  Location:  [facility] OR;  Service: General;  Laterality: Right;  . CATARACT EXTRACTION, BILATERAL    . COLONOSCOPY W/ BIOPSIES AND POLYPECTOMY    . EYE SURGERY     multiple bilateral  . INGUINAL HERNIA REPAIR Right 04/12/2018   Procedure: OPEN RIGHT INGUINAL HERNIA REPAIR WITH MESH;  Surgeon: Byerly, Faera, MD;  Location: [facility] OR;  Service: General;  Laterality: Right;  . RECTOCELE REPAIR Bilateral   Appendectomy and planned Cholescystectomy  FAMILY HISTORY Family History  Problem Relation Age of Onset  . Colon cancer Mother   . Hypertension Mother   . Heart disease Father   . Meniere's disease Father   . Heart disease Sister   . Arthritis Son        back  . Glaucoma Son   . Hypertension Son   . Cataracts Son   . Colon cancer Sister   . Alzheimer's disease Sister   . Glaucoma Sister   . Glaucoma Sister   . Hyperlipidemia Sister   . Hypertension Sister   . Cancer Sister        thyroid  . Diabetes Sister   . Glaucoma Sister   The patient's father died at age 76 due to MI. The patient's mother died at 95. The patient's mother had colon cancer diagnosed at age 88. There was a paternal aunt with breast cancer diagnosed at an old age. The patient's sister was diagnosed with colon cancer at age 67. She denies a history of ovarian cancer in the family.    GYNECOLOGIC HISTORY:  No LMP recorded. Patient has had a hysterectomy. Menarche: 84 years old Age at first live birth: 84 years old She is GXP4. She is status post total hysterectomy with bilateral salpingo-oophorectomy in 1978. The patient briefly took HRT, and she didn't have intense hot flashes. She never used contraception.    SOCIAL HISTORY:  Morgan Morgan worked as a seamstress. Her husband died in June 2019 due to lung issues. At home is herself with no pets.  She is considering moving to a retirement community.  The patient's son, Amber Morgan is retired from [employer]. The patient's son Amber Morgan is retired after 30 years in the [employer]. The  patient's son, Amber Morgan is a warehouse technician in [place]. The patient's son Amber Morgan, is an auto shop owner in [place]. The patient has 8 grandchildren and 8 great- grandchildren. The patient attends [employer].      ADVANCED DIRECTIVES: The patient's son- Amber Morgan Cavitt [phone] is her healthcare power of attorney   HEALTH MAINTENANCE: Social History   Tobacco Use  . Smoking status: Never Smoker  . Smokeless tobacco: Never Used  Substance Use Topics  . Alcohol use: No  . Drug use: No     Colonoscopy: 12/2013 / Dr. Houston / polyp removal  PAP:   Bone density: 12/18/16 T score of -0.6   Allergies  Allergen Reactions  . Doxycycline Rash    Current Outpatient Medications  Medication Sig Dispense Refill  . amLODipine (NORVASC) 2.5 MG tablet Take 1 tablet (2.5 mg total) by mouth daily. 90 tablet 1  . Artificial Tear Solution (GENTEAL TEARS OP) Place 1 drop into both eyes 2 (two) times daily.    . Fiber POWD Take 1 Scoop by mouth daily.    . ibuprofen (ADVIL,MOTRIN) 200 MG tablet Take 200 mg by mouth every   6 (six) hours as needed.    . Multiple Vitamins-Minerals (ONE-A-DAY WOMENS 50 PLUS PO) Take 1 each by mouth daily.    . polyethylene glycol (MIRALAX / GLYCOLAX) packet Take 17 g by mouth 2 (two) times daily.    . potassium chloride SA (K-DUR,KLOR-CON) 20 MEQ tablet TAKE 1 TABLET BY MOUTH ONCE DAILY 90 tablet 1  . Probiotic Product (PROBIOTIC DAILY PO) Take 1 capsule by mouth daily.     Marland Kitchen triamterene-hydrochlorothiazide (MAXZIDE-25) 37.5-25 MG tablet TAKE 1 TABLET EVERY DAY 90 tablet 2   No current facility-administered medications for this visit.     OBJECTIVE: Older white woman in no acute distress  Vitals:   05/20/18 1117  BP: (!) 155/51  Pulse: 71  Resp: 17  Temp: 98.2 F (36.8 C)  SpO2: 99%     Body mass index is 23.63 kg/m.   Wt Readings from Last 3 Encounters:  05/20/18 117 lb (53.1 kg)  05/04/18 116 lb 6.4 oz (52.8 kg)  04/29/18 119 lb  3.2 oz (54.1 kg)      ECOG FS:2 - Symptomatic, <50% confined to bed   No cervical or supraclavicular adenopathy Lungs no rales or rhonchi Heart regular rate and rhythm Abd soft, nontender, positive bowel sounds MSK no focal spinal tenderness, no upper extremity lymphedema Neuro: nonfocal, well oriented, appropriate affect Breasts: The right breast is status post recent lumpectomy.  The incision is healing nicely, without erythema, dehiscence, or swelling.  The left breast is benign.  Both axillae are benign.   LAB RESULTS:  CMP     Component Value Date/Time   NA 140 03/30/2018 1214   K 4.0 03/30/2018 1214   CL 103 03/30/2018 1214   CO2 30 03/30/2018 1214   GLUCOSE 96 03/30/2018 1214   BUN 31 (H) 03/30/2018 1214   CREATININE 0.97 03/30/2018 1214   CREATININE 0.79 05/25/2014 0853   CALCIUM 9.8 03/30/2018 1214   PROT 6.4 (L) 03/30/2018 1214   ALBUMIN 3.9 03/30/2018 1214   Morgan 20 03/30/2018 1214   ALT 21 03/30/2018 1214   ALKPHOS 54 03/30/2018 1214   BILITOT 0.3 03/30/2018 1214   GFRNONAA 51 (L) 03/30/2018 1214   GFRAA 59 (L) 03/30/2018 1214    No results found for: TOTALPROTELP, ALBUMINELP, A1GS, A2GS, BETS, BETA2SER, GAMS, MSPIKE, SPEI  No results found for: KPAFRELGTCHN, LAMBDASER, KAPLAMBRATIO  Lab Results  Component Value Date   WBC 5.6 03/30/2018   NEUTROABS 3.7 03/30/2018   HGB 11.7 03/30/2018   HCT 35.2 03/30/2018   MCV 95.6 03/30/2018   PLT 213 03/30/2018    _0 @  No results found for: LABCA2  No components found for: VPXTGG269  No results for input(s): INR in the last 168 hours.  No results found for: LABCA2  No results found for: SWN462  No results found for: VOJ500  No results found for: XFG182  No results found for: CA2729  No components found for: HGQUANT  No results found for: CEA1 / No results found for: CEA1   No results found for: AFPTUMOR  No results found for: CHROMOGRNA  No results found for: PSA1  No visits  with results within 3 Day(s) from this visit.  Latest known visit with results is:  Admission on 04/12/2018, Discharged on 04/12/2018  Component Date Value Ref Range Status  . Glucose-Capillary 04/12/2018 162* 70 - 99 mg/dL Final    (this displays the last labs from the last 3 days)  No results found for: TOTALPROTELP, ALBUMINELP, A1GS,  A2GS, BETS, BETA2SER, GAMS, MSPIKE, SPEI (this displays SPEP labs)  No results found for: KPAFRELGTCHN, LAMBDASER, KAPLAMBRATIO (kappa/lambda light chains)  No results found for: HGBA, HGBA2QUANT, HGBFQUANT, HGBSQUAN (Hemoglobinopathy evaluation)   No results found for: LDH  No results found for: IRON, TIBC, IRONPCTSAT (Iron and TIBC)  No results found for: FERRITIN  Urinalysis    Component Value Date/Time   COLORURINE YELLOW 12/10/2017 1442   APPEARANCEUR Cloudy (A) 12/10/2017 1442   LABSPEC 1.015 12/10/2017 1442   PHURINE 6.5 12/10/2017 1442   GLUCOSEU NEGATIVE 12/10/2017 1442   HGBUR TRACE-INTACT (A) 12/10/2017 1442   BILIRUBINUR NEGATIVE 12/10/2017 1442   BILIRUBINUR negative 02/02/2017 0811   KETONESUR NEGATIVE 12/10/2017 1442   PROTEINUR 1 02/02/2017 0811   PROTEINUR NEG 02/06/2013 1053   UROBILINOGEN 0.2 12/10/2017 1442   NITRITE NEGATIVE 12/10/2017 1442   LEUKOCYTESUR LARGE (A) 12/10/2017 1442     STUDIES: US Venous Img Lower Unilateral Left  Result Date: 04/29/2018 CLINICAL DATA:  Left thigh pain. Recent diagnosis of superficial thrombophlebitis involving a varicose vein. History of recurrent breast cancer with recent breast surgery. Evaluate for acute or chronic DVT or SVT. EXAM: LEFT LOWER EXTREMITY VENOUS DOPPLER ULTRASOUND TECHNIQUE: Gray-scale sonography with graded compression, as well as color Doppler and duplex ultrasound were performed to evaluate the lower extremity deep venous systems from the level of the common femoral vein and including the common femoral, femoral, profunda femoral, popliteal and calf veins  including the posterior tibial, peroneal and gastrocnemius veins when visible. The superficial great saphenous vein was also interrogated. Spectral Doppler was utilized to evaluate flow at rest and with distal augmentation maneuvers in the common femoral, femoral and popliteal veins. COMPARISON:  Left lower extremity venous Doppler ultrasound - 04/21/2018 FINDINGS: Contralateral Common Femoral Vein: Respiratory phasicity is normal and symmetric with the symptomatic side. No evidence of thrombus. Normal compressibility. Common Femoral Vein: No evidence of thrombus. Normal compressibility, respiratory phasicity and response to augmentation. Saphenofemoral Junction: No evidence of thrombus. Normal compressibility and flow on color Doppler imaging. Profunda Femoral Vein: No evidence of thrombus. Normal compressibility and flow on color Doppler imaging. Femoral Vein: No evidence of thrombus. Normal compressibility, respiratory phasicity and response to augmentation. Popliteal Vein: No evidence of thrombus. Normal compressibility, respiratory phasicity and response to augmentation. Calf Veins: No evidence of thrombus. Normal compressibility and flow on color Doppler imaging. Superficial Great Saphenous Vein: No evidence of thrombus. Normal compressibility. Venous Reflux:  None. Other Findings: Grossly unchanged thrombosed superficial varicosity correlates with the patient's palpable area of concern involving the medial aspect of the calf (images 35 through 40), similar to the 04/21/2018 examination. IMPRESSION: 1. No evidence of acute or chronic DVT within the left lower extremity. 2. Grossly unchanged occlusive superficial thrombophlebitis involving a varicosity within the medial aspect of the left calf. Again, there is no extension of this occlusive SVT to the deep venous system of the left lower extremity. Electronically Signed   By: Sandi Mariscal M.D.   On: 04/29/2018 09:49   US Venous Img Lower Unilateral  Left  Result Date: 04/21/2018 CLINICAL DATA:  Left leg swelling EXAM: LEFT LOWER EXTREMITY VENOUS DOPPLER ULTRASOUND TECHNIQUE: Gray-scale sonography with graded compression, as well as color Doppler and duplex ultrasound were performed to evaluate the lower extremity deep venous systems from the level of the common femoral vein and including the common femoral, femoral, profunda femoral, popliteal and calf veins including the posterior tibial, peroneal and gastrocnemius veins when visible. The superficial great saphenous  vein was also interrogated. Spectral Doppler was utilized to evaluate flow at rest and with distal augmentation maneuvers in the common femoral, femoral and popliteal veins. COMPARISON:  None. FINDINGS: Contralateral Common Femoral Vein: Respiratory phasicity is normal and symmetric with the symptomatic side. No evidence of thrombus. Normal compressibility. Common Femoral Vein: No evidence of thrombus. Normal compressibility, respiratory phasicity and response to augmentation. Saphenofemoral Junction: No evidence of thrombus. Normal compressibility and flow on color Doppler imaging. Profunda Femoral Vein: No evidence of thrombus. Normal compressibility and flow on color Doppler imaging. Femoral Vein: No evidence of thrombus. Normal compressibility, respiratory phasicity and response to augmentation. Popliteal Vein: No evidence of thrombus. Normal compressibility, respiratory phasicity and response to augmentation. Calf Veins: No evidence of thrombus. Normal compressibility and flow on color Doppler imaging. Superficial Great Saphenous Vein: See below in other findings Venous Reflux:  None. Other Findings: There is a thrombosed varicose vein in the upper medial left calf which extends into the greater saphenous vein focally at the knee. Remainder the greater saphenous vein is patent. IMPRESSION: Superficial thrombophlebitis involving a varicose vein in the medial upper calf and extending into a  focal portion of the greater saphenous vein at the knee. No evidence of deep venous thrombosis. Electronically Signed   By: Kevin  Dover M.D.   On: 04/21/2018 20:27    ELIGIBLE FOR AVAILABLE RESEARCH PROTOCOL: no  ASSESSMENT: 84 y.o. [place], [place] woman status post right breast upper inner quadrant biopsy 03/18/2018 for a clinical T1b No, stage IA base of lobular carcinoma, grade 2, estrogen and progesterone receptor positive, HER-2 not amplified, with an MIB-1 of 2%.  (1) status post right lumpectomy with no sentinel lymph node sampling 04/12/2018 for a pT1c cN0, stage IA invasive lobular carcinoma, with negative margins  (2) no adjuvant radiation indicated  (3) anastrozole started 05/20/2018  (a) bone density 03/03/2018 finds a T score of -1.8 (osteopenia).  PLAN: I spent approximately 30 minutes face to face with Cantrell with more than 50% of that time spent in counseling and coordination of care.  She has had a good surgery and no adjuvant radiation is planned.  This does mean that she will get additional benefit from antiestrogens both in terms of systemic and local disease recurrence.  Basically antiestrogens will cut the risk in half of both those possibilities.  We discussed the difference between tamoxifen and anastrozole in detail. She understands that anastrozole and the aromatase inhibitors in general work by blocking estrogen production. Accordingly vaginal dryness, decrease in bone density, and of course hot flashes can result. The aromatase inhibitors can also negatively affect the cholesterol profile, although that is a minor effect. One out of 5 women on aromatase inhibitors we will feel "old and achy". This arthralgia/myalgia syndrome, which resembles fibromyalgia clinically, does resolve with stopping the medications. Accordingly this is not a reason to not try an aromatase inhibitor but it is a frequent reason to stop it (in other words 20% of women will not be able to tolerate  these medications).  Tamoxifen on the other hand does not block estrogen production. It does not "take away a woman's estrogen". It blocks the estrogen receptor in breast cells. Like anastrozole, it can also cause hot flashes. As opposed to anastrozole, tamoxifen has many estrogen-like effects. It is technically an estrogen receptor modulator. This means that in some tissues tamoxifen works like estrogen-- for example it helps strengthen the bones. It tends to improve the cholesterol profile. It can cause thickening of the   endometrial lining, and even endometrial polyps or rarely cancer of the uterus.(The risk of uterine cancer due to tamoxifen is one additional cancer per thousand women year). It can cause vaginal wetness or stickiness. It can cause blood clots through this estrogen-like effect--the risk of blood clots with tamoxifen is exactly the same as with birth control pills or hormone replacement.  Neither of these agents causes mood changes or weight gain, despite the popular belief that they can have these side effects. We have data from studies comparing either of these drugs with placebo, and in those cases the control group had the same amount of weight gain and depression as the group that took the drug.  Given the concern regarding superficial thrombophlebitis, we are going to stay away from tamoxifen.  The patient does have osteopenia.  She was to start on vitamin D supplementation and today we discussed a walking program.  I think with those safeguards going on anastrozole may be more beneficial than harmful for her.  Accordingly she will start anastrozole today.  If she tolerates it well the plan will be to continue that for total of 5 years.  However if she does not tolerate it well or has any significant side effects I would be very comfortable with observation alone in this very elderly patient  She will see me again in about 3 months.  She knows to call for any other issues that may  develop before then.      Ellwood Steidle, Morgan Dad, MD  05/20/18 11:25 AM Medical Oncology and Hematology [facility] [address] [place], [place] [address] Tel. [phone]    Fax. [phone]  Alice Rieger, am acting as scribe for Chauncey Cruel MD.  I, Lurline Del MD, have reviewed the above documentation for accuracy and completeness, and I agree with the above.

## 2018-05-20 ENCOUNTER — Inpatient Hospital Stay: Payer: Medicare HMO | Attending: Oncology | Admitting: Oncology

## 2018-05-20 ENCOUNTER — Telehealth: Payer: Self-pay | Admitting: Oncology

## 2018-05-20 VITALS — BP 155/51 | HR 71 | Temp 98.2°F | Resp 17 | Ht 59.0 in | Wt 117.0 lb

## 2018-05-20 DIAGNOSIS — I8002 Phlebitis and thrombophlebitis of superficial vessels of left lower extremity: Secondary | ICD-10-CM | POA: Diagnosis not present

## 2018-05-20 DIAGNOSIS — C50211 Malignant neoplasm of upper-inner quadrant of right female breast: Secondary | ICD-10-CM | POA: Insufficient documentation

## 2018-05-20 DIAGNOSIS — I10 Essential (primary) hypertension: Secondary | ICD-10-CM | POA: Diagnosis not present

## 2018-05-20 DIAGNOSIS — Z170 Estrogen receptor positive status [ER+]: Secondary | ICD-10-CM | POA: Diagnosis not present

## 2018-05-20 DIAGNOSIS — Z79811 Long term (current) use of aromatase inhibitors: Secondary | ICD-10-CM | POA: Diagnosis not present

## 2018-05-20 DIAGNOSIS — M8580 Other specified disorders of bone density and structure, unspecified site: Secondary | ICD-10-CM | POA: Diagnosis not present

## 2018-05-20 MED ORDER — ANASTROZOLE 1 MG PO TABS: 1.0000 mg | ORAL_TABLET | Freq: Every day | ORAL | 4 refills | Status: DC

## 2018-05-20 NOTE — Telephone Encounter (Signed)
Per 8/16 los.  Gave patient and caregiver appt dates and time.  (printer down).

## 2018-05-23 ENCOUNTER — Encounter: Payer: Self-pay | Admitting: *Deleted

## 2018-05-24 ENCOUNTER — Telehealth: Payer: Self-pay | Admitting: Oncology

## 2018-05-24 NOTE — Telephone Encounter (Signed)
Spoke to patient regarding upcoming feb appts per 8/19 sch message

## 2018-05-30 ENCOUNTER — Ambulatory Visit: Payer: Medicare HMO | Admitting: Family Medicine

## 2018-06-07 ENCOUNTER — Ambulatory Visit (INDEPENDENT_AMBULATORY_CARE_PROVIDER_SITE_OTHER): Payer: Medicare HMO | Admitting: Family Medicine

## 2018-06-07 ENCOUNTER — Encounter: Payer: Self-pay | Admitting: Family Medicine

## 2018-06-07 VITALS — BP 132/58 | HR 80 | Temp 97.6°F | Resp 18 | Wt 119.0 lb

## 2018-06-07 DIAGNOSIS — Z23 Encounter for immunization: Secondary | ICD-10-CM

## 2018-06-07 DIAGNOSIS — M8580 Other specified disorders of bone density and structure, unspecified site: Secondary | ICD-10-CM

## 2018-06-07 DIAGNOSIS — R739 Hyperglycemia, unspecified: Secondary | ICD-10-CM

## 2018-06-07 DIAGNOSIS — E785 Hyperlipidemia, unspecified: Secondary | ICD-10-CM

## 2018-06-07 DIAGNOSIS — I809 Phlebitis and thrombophlebitis of unspecified site: Secondary | ICD-10-CM

## 2018-06-07 DIAGNOSIS — R000 Tachycardia, unspecified: Secondary | ICD-10-CM

## 2018-06-07 NOTE — Progress Notes (Signed)
Subjective:    Patient ID: Amber Morgan, female    DOB: August 02, 1927, 84 y.o.   MRN: [id number]  No chief complaint on file.   HPI Patient is in today for a follow up. She is doing well post lumpectomy and hernia surgery. She reports that she decided not to take anastrazole for fear of losing what vision she has left. She also reports that she does not take her amlodipine as prescribed, but only when her BP gets high. Several weeks ago she was seen by Mackie Pai regarding superficial thrombophlebitis in her LLE, which has mostly resolved.   Past Medical History:  Diagnosis Date  . Allergy   . Arthritis   . Cancer [facility])    breast cancer   . Colon polyps   . Diverticulitis   . Diverticulosis 01/04/2014  . Glaucoma   . Hernia, inguinal, right   . History of chicken pox    childhood  . History of hiatal hernia   . HTN (hypertension)   . Hyperglycemia 09/05/2013  . Lipoma of arm 12/09/2014   right  . Nocturia 02/08/2013  . Osteopenia 12/09/2014  . Personal history of colonic polyps 09/05/2013  . Pre-diabetes   . Preventative health care 08/14/2016  . Unspecified constipation 02/06/2013  . Urinary incontinence   . Viral infection characterized by skin and mucous membrane lesions 05/11/2013  . Wears glasses     Past Surgical History:  Procedure Laterality Date  . ABDOMINAL HYSTERECTOMY    . APPENDECTOMY    . BLADDER SURGERY  2003   bladder tact  . BREAST LUMPECTOMY WITH RADIOACTIVE SEED LOCALIZATION Right 04/12/2018   Procedure: BREAST LUMPECTOMY WITH RADIOACTIVE SEED LOCALIZATION;  Surgeon: [name], MD;  Location: [place];  Service: General;  Laterality: Right;  . CATARACT EXTRACTION, BILATERAL    . COLONOSCOPY W/ BIOPSIES AND POLYPECTOMY    . EYE SURGERY     multiple bilateral  . INGUINAL HERNIA REPAIR Right 04/12/2018   Procedure: OPEN RIGHT INGUINAL HERNIA REPAIR WITH MESH;  Surgeon: [name], MD;  Location: [place];  Service: General;  Laterality: Right;  . RECTOCELE  REPAIR Bilateral     Family History  Problem Relation Age of Onset  . Colon cancer Mother   . Hypertension Mother   . Heart disease Father   . Meniere's disease Father   . Heart disease Sister   . Arthritis Son        back  . Glaucoma Son   . Hypertension Son   . Cataracts Son   . Colon cancer Sister   . Alzheimer's disease Sister   . Glaucoma Sister   . Glaucoma Sister   . Hyperlipidemia Sister   . Hypertension Sister   . Cancer Sister        thyroid  . Diabetes Sister   . Glaucoma Sister     Social History   Socioeconomic History  . Marital status: Married    Spouse name: Not on file  . Number of children: 4  . Years of education: Not on file  . Highest education level: Not on file  Occupational History  . Not on file  Social Needs  . Financial resource strain: Not on file  . Food insecurity:    Worry: Not on file    Inability: Not on file  . Transportation needs:    Medical: Not on file    Non-medical: Not on file  Tobacco Use  . Smoking status: Never Smoker  .  Smokeless tobacco: Never Used  Substance and Sexual Activity  . Alcohol use: No  . Drug use: No  . Sexual activity: Not Currently  Lifestyle  . Physical activity:    Days per week: Not on file    Minutes per session: Not on file  . Stress: Not on file  Relationships  . Social connections:    Talks on phone: Not on file    Gets together: Not on file    Attends religious service: Not on file    Active member of club or organization: Not on file    Attends meetings of clubs or organizations: Not on file    Relationship status: Not on file  . Intimate partner violence:    Fear of current or ex partner: Not on file    Emotionally abused: Not on file    Physically abused: Not on file    Forced sexual activity: Not on file  Other Topics Concern  . Not on file  Social History Narrative  . Not on file    Outpatient Medications Prior to Visit  Medication Sig Dispense Refill  . amLODipine  (NORVASC) 2.5 MG tablet Take 1 tablet (2.5 mg total) by mouth daily. 90 tablet 1  . Artificial Tear Solution (GENTEAL TEARS OP) Place 1 drop into both eyes 2 (two) times daily.    Marland Kitchen aspirin EC 81 MG tablet Take 81 mg by mouth daily.    . cholecalciferol (VITAMIN D) 1000 units tablet Take 1 tablet (1,000 Units total) by mouth daily.    . Fiber POWD Take 1 Scoop by mouth daily.    . Multiple Vitamins-Minerals (ONE-A-DAY WOMENS 50 PLUS PO) Take 1 each by mouth daily.    . polyethylene glycol (MIRALAX / GLYCOLAX) packet Take 17 g by mouth 2 (two) times daily.    . potassium chloride SA (K-DUR,KLOR-CON) 20 MEQ tablet TAKE 1 TABLET BY MOUTH ONCE DAILY 90 tablet 1  . Probiotic Product (PROBIOTIC DAILY PO) Take 1 capsule by mouth daily.     Marland Kitchen triamterene-hydrochlorothiazide (MAXZIDE-25) 37.5-25 MG tablet TAKE 1 TABLET EVERY DAY 90 tablet 2  . ibuprofen (ADVIL,MOTRIN) 200 MG tablet Take 200 mg by mouth every 6 (six) hours as needed.    Marland Kitchen anastrozole (ARIMIDEX) 1 MG tablet Take 1 tablet (1 mg total) by mouth daily. (Patient not taking: Reported on 06/07/2018) 90 tablet 4   No facility-administered medications prior to visit.     Allergies  Allergen Reactions  . Doxycycline Rash    ROS Constitutional: Negative for malaise/fatigue. Negative for fever.  HENT: Negative for congestion.   Eyes: Negative for blurred vision.  Respiratory: Negative for shortness of breath.   Cardiovascular: Negative for chest pain, palpitations and leg swelling.  Gastrointestinal: Negative for abdominal pain, blood in stool and nausea.  Genitourinary: Negative for dysuria and frequency.  Musculoskeletal: Negative for falls.  Skin: Negative for rash.  Neurological: Negative for dizziness, loss of consciousness and headaches.  Endo/Heme/Allergies: Negative for environmental allergies.  Psychiatric/Behavioral: Negative for depression and suicidal ideas. The patient is not nervous/anxious.      Objective:    Physical  Exam Constitutional: She is oriented to person, place, and time. She appears well-developed and well-nourished. No distress.  HENT:  Head: Normocephalic and atraumatic.  Eyes: Conjunctivae are normal.  Neck: Neck supple. No thyromegaly present.  Cardiovascular: Normal rate, regular rhythm and normal heart sounds.  No murmur heard. Pulmonary/Chest: Effort normal and breath sounds normal. No respiratory distress.  Abdominal:  Soft. Bowel sounds are normal. She exhibits no distension and no mass. There is no tenderness.  Musculoskeletal: She exhibits no edema.  Lymphadenopathy:    She has no cervical adenopathy.  Neurological: She is alert and oriented to person, place, and time.  Skin: Skin is warm and dry.  Psychiatric: She has a normal mood and affect. Her behavior is normal.   BP (!) 132/58 (BP Location: Left Arm, Patient Position: Sitting, Cuff Size: Normal)   Pulse 80   Temp 97.6 F (36.4 C) (Oral)   Resp 18   Wt 119 lb (54 kg)   SpO2 97%   BMI 24.04 kg/m  Wt Readings from Last 3 Encounters:  06/07/18 119 lb (54 kg)  05/20/18 117 lb (53.1 kg)  05/04/18 116 lb 6.4 oz (52.8 kg)     Lab Results  Component Value Date   WBC 5.6 03/30/2018   HGB 11.7 03/30/2018   HCT 35.2 03/30/2018   PLT 213 03/30/2018   GLUCOSE 96 03/30/2018   CHOL 136 09/17/2017   TRIG 62.0 09/17/2017   HDL 56.80 09/17/2017   LDLCALC 67 09/17/2017   ALT 21 03/30/2018   Morgan 20 03/30/2018   NA 140 03/30/2018   K 4.0 03/30/2018   CL 103 03/30/2018   CREATININE 0.97 03/30/2018   BUN 31 (H) 03/30/2018   CO2 30 03/30/2018   TSH 3.23 09/17/2017   HGBA1C 6.1 (H) 04/04/2018   MICROALBUR 0.8 08/06/2016    Lab Results  Component Value Date   TSH 3.23 09/17/2017   Lab Results  Component Value Date   WBC 5.6 03/30/2018   HGB 11.7 03/30/2018   HCT 35.2 03/30/2018   MCV 95.6 03/30/2018   PLT 213 03/30/2018   Lab Results  Component Value Date   NA 140 03/30/2018   K 4.0 03/30/2018   CO2 30  03/30/2018   GLUCOSE 96 03/30/2018   BUN 31 (H) 03/30/2018   CREATININE 0.97 03/30/2018   BILITOT 0.3 03/30/2018   ALKPHOS 54 03/30/2018   Morgan 20 03/30/2018   ALT 21 03/30/2018   PROT 6.4 (L) 03/30/2018   ALBUMIN 3.9 03/30/2018   CALCIUM 9.8 03/30/2018   ANIONGAP 7 03/30/2018   GFR 59.66 (L) 01/11/2018   Lab Results  Component Value Date   CHOL 136 09/17/2017   Lab Results  Component Value Date   HDL 56.80 09/17/2017   Lab Results  Component Value Date   LDLCALC 67 09/17/2017   Lab Results  Component Value Date   TRIG 62.0 09/17/2017   Lab Results  Component Value Date   CHOLHDL 2 09/17/2017   Lab Results  Component Value Date   HGBA1C 6.1 (H) 04/04/2018       Assessment & Plan:   Essential Hypertension: Encouraged patient to check BP at home, and contact if higher than j150. Refilled HCTZ-Triamterene.   Preventative: Received flu shot today.   Problem List Items Addressed This Visit      Musculoskeletal and Integument   Osteopenia    Encouraged to get adequate exercise, calcium and vitamin d intake       Other Visit Diagnoses    Needs flu shot    -  Primary   Relevant Orders   Flu vaccine HIGH DOSE PF (Fluzone High dose)      I have discontinued Taiylor M. Ewalt's ibuprofen. I am also having her maintain her Multiple Vitamins-Minerals (ONE-A-DAY WOMENS 50 PLUS PO), Probiotic Product (PROBIOTIC DAILY PO), triamterene-hydrochlorothiazide, amLODipine, potassium chloride  SA, polyethylene glycol, Artificial Tear Solution (GENTEAL TEARS OP), Fiber, cholecalciferol, anastrozole, and aspirin EC.  No orders of the defined types were placed in this encounter.    Verdis Frederickson, Medical Student   Patient seen with and examined with student.  Agree with documentation See separate note for further documentation Mosie Lukes MD

## 2018-06-07 NOTE — Assessment & Plan Note (Signed)
Encouraged to get adequate exercise, calcium and vitamin d intake 

## 2018-06-07 NOTE — Patient Instructions (Signed)

## 2018-06-12 DIAGNOSIS — I809 Phlebitis and thrombophlebitis of unspecified site: Secondary | ICD-10-CM | POA: Insufficient documentation

## 2018-06-12 NOTE — Assessment & Plan Note (Signed)
hgba1c acceptable, minimize simple carbs. Increase exercise as tolerated.  

## 2018-06-12 NOTE — Progress Notes (Signed)
Subjective:    Patient ID: Amber Morgan, female    DOB: 1927-03-18, 84 y.o.   MRN: [id number]  No chief complaint on file.   HPI Patient is in today for follow up. She did well with he lumpectomy and hernia repair and denies any surgical complications. He bowels ae moving well. No bloody or tarry stool. She decided against the Aimedex so far. She has only been taking her amlodipine intermittently which has left her blood pressure labile. Her pedal edema and he left lower extremity. No fevers or chills. Denies CP/palp/SOB/HA/congestion/fevers/GI or GU c/o. Taking meds as prescribed  Past Medical History:  Diagnosis Date  . Allergy   . Arthritis   . Cancer [facility])    breast cancer   . Colon polyps   . Diverticulitis   . Diverticulosis 01/04/2014  . Glaucoma   . Hernia, inguinal, right   . History of chicken pox    childhood  . History of hiatal hernia   . HTN (hypertension)   . Hyperglycemia 09/05/2013  . Lipoma of arm 12/09/2014   right  . Nocturia 02/08/2013  . Osteopenia 12/09/2014  . Personal history of colonic polyps 09/05/2013  . Pre-diabetes   . Preventative health care 08/14/2016  . Unspecified constipation 02/06/2013  . Urinary incontinence   . Viral infection characterized by skin and mucous membrane lesions 05/11/2013  . Wears glasses     Past Surgical History:  Procedure Laterality Date  . ABDOMINAL HYSTERECTOMY    . APPENDECTOMY    . BLADDER SURGERY  2003   bladder tact  . BREAST LUMPECTOMY WITH RADIOACTIVE SEED LOCALIZATION Right 04/12/2018   Procedure: BREAST LUMPECTOMY WITH RADIOACTIVE SEED LOCALIZATION;  Surgeon: [name], MD;  Location: [place];  Service: General;  Laterality: Right;  . CATARACT EXTRACTION, BILATERAL    . COLONOSCOPY W/ BIOPSIES AND POLYPECTOMY    . EYE SURGERY     multiple bilateral  . INGUINAL HERNIA REPAIR Right 04/12/2018   Procedure: OPEN RIGHT INGUINAL HERNIA REPAIR WITH MESH;  Surgeon: [name], MD;  Location: [place];  Service:  General;  Laterality: Right;  . RECTOCELE REPAIR Bilateral     Family History  Problem Relation Age of Onset  . Colon cancer Mother   . Hypertension Mother   . Heart disease Father   . Meniere's disease Father   . Heart disease Sister   . Arthritis Son        back  . Glaucoma Son   . Hypertension Son   . Cataracts Son   . Colon cancer Sister   . Alzheimer's disease Sister   . Glaucoma Sister   . Glaucoma Sister   . Hyperlipidemia Sister   . Hypertension Sister   . Cancer Sister        thyroid  . Diabetes Sister   . Glaucoma Sister     Social History   Socioeconomic History  . Marital status: Married    Spouse name: Not on file  . Number of children: 4  . Years of education: Not on file  . Highest education level: Not on file  Occupational History  . Not on file  Social Needs  . Financial resource strain: Not on file  . Food insecurity:    Worry: Not on file    Inability: Not on file  . Transportation needs:    Medical: Not on file    Non-medical: Not on file  Tobacco Use  . Smoking status: Never Smoker  .  Smokeless tobacco: Never Used  Substance and Sexual Activity  . Alcohol use: No  . Drug use: No  . Sexual activity: Not Currently  Lifestyle  . Physical activity:    Days per week: Not on file    Minutes per session: Not on file  . Stress: Not on file  Relationships  . Social connections:    Talks on phone: Not on file    Gets together: Not on file    Attends religious service: Not on file    Active member of club or organization: Not on file    Attends meetings of clubs or organizations: Not on file    Relationship status: Not on file  . Intimate partner violence:    Fear of current or ex partner: Not on file    Emotionally abused: Not on file    Physically abused: Not on file    Forced sexual activity: Not on file  Other Topics Concern  . Not on file  Social History Narrative  . Not on file    Outpatient Medications Prior to Visit    Medication Sig Dispense Refill  . amLODipine (NORVASC) 2.5 MG tablet Take 1 tablet (2.5 mg total) by mouth daily. 90 tablet 1  . Artificial Tear Solution (GENTEAL TEARS OP) Place 1 drop into both eyes 2 (two) times daily.    Marland Kitchen aspirin EC 81 MG tablet Take 81 mg by mouth daily.    . cholecalciferol (VITAMIN D) 1000 units tablet Take 1 tablet (1,000 Units total) by mouth daily.    . Fiber POWD Take 1 Scoop by mouth daily.    . Multiple Vitamins-Minerals (ONE-A-DAY WOMENS 50 PLUS PO) Take 1 each by mouth daily.    . polyethylene glycol (MIRALAX / GLYCOLAX) packet Take 17 g by mouth 2 (two) times daily.    . potassium chloride SA (K-DUR,KLOR-CON) 20 MEQ tablet TAKE 1 TABLET BY MOUTH ONCE DAILY 90 tablet 1  . Probiotic Product (PROBIOTIC DAILY PO) Take 1 capsule by mouth daily.     Marland Kitchen triamterene-hydrochlorothiazide (MAXZIDE-25) 37.5-25 MG tablet TAKE 1 TABLET EVERY DAY 90 tablet 2  . ibuprofen (ADVIL,MOTRIN) 200 MG tablet Take 200 mg by mouth every 6 (six) hours as needed.    Marland Kitchen anastrozole (ARIMIDEX) 1 MG tablet Take 1 tablet (1 mg total) by mouth daily. (Patient not taking: Reported on 06/07/2018) 90 tablet 4   No facility-administered medications prior to visit.     Allergies  Allergen Reactions  . Doxycycline Rash    Review of Systems  Constitutional: Negative for fever and malaise/fatigue.  HENT: Negative for congestion.   Eyes: Negative for blurred vision.  Respiratory: Negative for shortness of breath.   Cardiovascular: Positive for leg swelling. Negative for chest pain and palpitations.  Gastrointestinal: Negative for abdominal pain, blood in stool and nausea.  Genitourinary: Negative for dysuria and frequency.  Musculoskeletal: Negative for falls.  Skin: Negative for rash.  Neurological: Negative for dizziness, loss of consciousness and headaches.  Endo/Heme/Allergies: Negative for environmental allergies.  Psychiatric/Behavioral: Negative for depression. The patient is not  nervous/anxious.        Objective:    Physical Exam  Constitutional: She is oriented to person, place, and time. She appears well-developed and well-nourished. No distress.  HENT:  Head: Normocephalic and atraumatic.  Nose: Nose normal.  Eyes: Right eye exhibits no discharge. Left eye exhibits no discharge.  Neck: Normal range of motion. Neck supple.  Cardiovascular: Normal rate and regular rhythm.  Murmur  heard. Pulmonary/Chest: Effort normal and breath sounds normal.  Abdominal: Soft. Bowel sounds are normal. There is no tenderness.  Musculoskeletal: She exhibits edema.  Varicose vein lower left leg. No surrounding erythema or warmth.   Neurological: She is alert and oriented to person, place, and time.  Skin: Skin is warm and dry.  Psychiatric: She has a normal mood and affect.  Nursing note and vitals reviewed.   BP (!) 132/58 (BP Location: Left Arm, Patient Position: Sitting, Cuff Size: Normal)   Pulse 80   Temp 97.6 F (36.4 C) (Oral)   Resp 18   Wt 119 lb (54 kg)   SpO2 97%   BMI 24.04 kg/m  Wt Readings from Last 3 Encounters:  06/07/18 119 lb (54 kg)  05/20/18 117 lb (53.1 kg)  05/04/18 116 lb 6.4 oz (52.8 kg)     Lab Results  Component Value Date   WBC 5.6 03/30/2018   HGB 11.7 03/30/2018   HCT 35.2 03/30/2018   PLT 213 03/30/2018   GLUCOSE 96 03/30/2018   CHOL 136 09/17/2017   TRIG 62.0 09/17/2017   HDL 56.80 09/17/2017   LDLCALC 67 09/17/2017   ALT 21 03/30/2018   Morgan 20 03/30/2018   NA 140 03/30/2018   K 4.0 03/30/2018   CL 103 03/30/2018   CREATININE 0.97 03/30/2018   BUN 31 (H) 03/30/2018   CO2 30 03/30/2018   TSH 3.23 09/17/2017   HGBA1C 6.1 (H) 04/04/2018   MICROALBUR 0.8 08/06/2016    Lab Results  Component Value Date   TSH 3.23 09/17/2017   Lab Results  Component Value Date   WBC 5.6 03/30/2018   HGB 11.7 03/30/2018   HCT 35.2 03/30/2018   MCV 95.6 03/30/2018   PLT 213 03/30/2018   Lab Results  Component Value Date   NA  140 03/30/2018   K 4.0 03/30/2018   CO2 30 03/30/2018   GLUCOSE 96 03/30/2018   BUN 31 (H) 03/30/2018   CREATININE 0.97 03/30/2018   BILITOT 0.3 03/30/2018   ALKPHOS 54 03/30/2018   Morgan 20 03/30/2018   ALT 21 03/30/2018   PROT 6.4 (L) 03/30/2018   ALBUMIN 3.9 03/30/2018   CALCIUM 9.8 03/30/2018   ANIONGAP 7 03/30/2018   GFR 59.66 (L) 01/11/2018   Lab Results  Component Value Date   CHOL 136 09/17/2017   Lab Results  Component Value Date   HDL 56.80 09/17/2017   Lab Results  Component Value Date   LDLCALC 67 09/17/2017   Lab Results  Component Value Date   TRIG 62.0 09/17/2017   Lab Results  Component Value Date   CHOLHDL 2 09/17/2017   Lab Results  Component Value Date   HGBA1C 6.1 (H) 04/04/2018       Assessment & Plan:   Problem List Items Addressed This Visit    Hyperlipidemia    Encouraged heart healthy diet, increase exercise, avoid trans fats, consider a krill oil cap daily      Relevant Medications   aspirin EC 81 MG tablet   Hyperglycemia    hgba1c acceptable, minimize simple carbs. Increase exercise as tolerated.       Osteopenia    Encouraged to get adequate exercise, calcium and vitamin d intake      Tachycardia    RRR today       Superficial thrombophlebitis    Compression hose, moist heat, if no improvement will need referral to vascular sugery      Relevant Medications  aspirin EC 81 MG tablet    Other Visit Diagnoses    Needs flu shot    -  Primary   Relevant Orders   Flu vaccine HIGH DOSE PF (Fluzone High dose)      I have discontinued Thayer M. Calia's ibuprofen. I am also having her maintain her Multiple Vitamins-Minerals (ONE-A-DAY WOMENS 50 PLUS PO), Probiotic Product (PROBIOTIC DAILY PO), triamterene-hydrochlorothiazide, amLODipine, potassium chloride SA, polyethylene glycol, Artificial Tear Solution (GENTEAL TEARS OP), Fiber, cholecalciferol, anastrozole, and aspirin EC.  No orders of the defined types were placed  in this encounter.    Penni Homans, MD

## 2018-06-12 NOTE — Assessment & Plan Note (Signed)
RRR today 

## 2018-06-12 NOTE — Assessment & Plan Note (Signed)
Encouraged heart healthy diet, increase exercise, avoid trans fats, consider a krill oil cap daily 

## 2018-06-12 NOTE — Assessment & Plan Note (Signed)
Compression hose, moist heat, if no improvement will need referral to vascular sugery

## 2018-06-21 NOTE — Progress Notes (Signed)
HPI: FU HTN and bradycardia. Noted to be bradycardic with heart rate in the 40s previously and atenolol discontinued. Amlodipine added. Carotid Dopplers June 2017 negative.  Since last seen, she denies dyspnea, chest pain, palpitations or syncope.  Minimal pedal edema.  She recently lost her husband.  Current Outpatient Medications  Medication Sig Dispense Refill  . amLODipine (NORVASC) 2.5 MG tablet Take 1 tablet (2.5 mg total) by mouth daily. 90 tablet 1  . Artificial Tear Solution (GENTEAL TEARS OP) Place 1 drop into both eyes 2 (two) times daily.    Marland Kitchen aspirin EC 81 MG tablet Take 81 mg by mouth daily.    . cholecalciferol (VITAMIN D) 1000 units tablet Take 1 tablet (1,000 Units total) by mouth daily.    . Fiber POWD Take 1 Scoop by mouth daily.    . Multiple Vitamins-Minerals (ONE-A-DAY WOMENS 50 PLUS PO) Take 1 each by mouth daily.    . polyethylene glycol (MIRALAX / GLYCOLAX) packet Take 17 g by mouth 2 (two) times daily.    . potassium chloride SA (K-DUR,KLOR-CON) 20 MEQ tablet TAKE 1 TABLET BY MOUTH ONCE DAILY 90 tablet 1  . Probiotic Product (PROBIOTIC DAILY PO) Take 1 capsule by mouth daily.     Marland Kitchen triamterene-hydrochlorothiazide (MAXZIDE-25) 37.5-25 MG tablet TAKE 1 TABLET EVERY DAY 90 tablet 2  . anastrozole (ARIMIDEX) 1 MG tablet Take 1 tablet (1 mg total) by mouth daily. (Patient not taking: Reported on 06/07/2018) 90 tablet 4   No current facility-administered medications for this visit.      Past Medical History:  Diagnosis Date  . Allergy   . Arthritis   . Cancer [facility])    breast cancer   . Colon polyps   . Diverticulitis   . Diverticulosis 01/04/2014  . Glaucoma   . Hernia, inguinal, right   . History of chicken pox    childhood  . History of hiatal hernia   . HTN (hypertension)   . Hyperglycemia 09/05/2013  . Lipoma of arm 12/09/2014   right  . Nocturia 02/08/2013  . Osteopenia 12/09/2014  . Personal history of colonic polyps 09/05/2013  . Pre-diabetes   .  Preventative health care 08/14/2016  . Unspecified constipation 02/06/2013  . Urinary incontinence   . Viral infection characterized by skin and mucous membrane lesions 05/11/2013  . Wears glasses     Past Surgical History:  Procedure Laterality Date  . ABDOMINAL HYSTERECTOMY    . APPENDECTOMY    . BLADDER SURGERY  2003   bladder tact  . BREAST LUMPECTOMY WITH RADIOACTIVE SEED LOCALIZATION Right 04/12/2018   Procedure: BREAST LUMPECTOMY WITH RADIOACTIVE SEED LOCALIZATION;  Surgeon: [name], MD;  Location: [place];  Service: General;  Laterality: Right;  . CATARACT EXTRACTION, BILATERAL    . COLONOSCOPY W/ BIOPSIES AND POLYPECTOMY    . EYE SURGERY     multiple bilateral  . INGUINAL HERNIA REPAIR Right 04/12/2018   Procedure: OPEN RIGHT INGUINAL HERNIA REPAIR WITH MESH;  Surgeon: [name], MD;  Location: [place];  Service: General;  Laterality: Right;  . RECTOCELE REPAIR Bilateral     Social History   Socioeconomic History  . Marital status: Married    Spouse name: Not on file  . Number of children: 4  . Years of education: Not on file  . Highest education level: Not on file  Occupational History  . Not on file  Social Needs  . Financial resource strain: Not on file  .  Food insecurity:    Worry: Not on file    Inability: Not on file  . Transportation needs:    Medical: Not on file    Non-medical: Not on file  Tobacco Use  . Smoking status: Never Smoker  . Smokeless tobacco: Never Used  Substance and Sexual Activity  . Alcohol use: No  . Drug use: No  . Sexual activity: Not Currently  Lifestyle  . Physical activity:    Days per week: Not on file    Minutes per session: Not on file  . Stress: Not on file  Relationships  . Social connections:    Talks on phone: Not on file    Gets together: Not on file    Attends religious service: Not on file    Active member of club or organization: Not on file    Attends meetings of clubs or organizations: Not on file     Relationship status: Not on file  . Intimate partner violence:    Fear of current or ex partner: Not on file    Emotionally abused: Not on file    Physically abused: Not on file    Forced sexual activity: Not on file  Other Topics Concern  . Not on file  Social History Narrative  . Not on file    Family History  Problem Relation Age of Onset  . Colon cancer Mother   . Hypertension Mother   . Heart disease Father   . Meniere's disease Father   . Heart disease Sister   . Arthritis Son        back  . Glaucoma Son   . Hypertension Son   . Cataracts Son   . Colon cancer Sister   . Alzheimer's disease Sister   . Glaucoma Sister   . Glaucoma Sister   . Hyperlipidemia Sister   . Hypertension Sister   . Cancer Sister        thyroid  . Diabetes Sister   . Glaucoma Sister     ROS: no fevers or chills, productive cough, hemoptysis, dysphasia, odynophagia, melena, hematochezia, dysuria, hematuria, rash, seizure activity, orthopnea, PND, claudication. Remaining systems are negative.  Physical Exam: Well-developed well-nourished in no acute distress.  Skin is warm and dry.  HEENT is normal.  Neck is supple.  Chest is clear to auscultation with normal expansion.  Cardiovascular exam is regular rate and rhythm.  Abdominal exam nontender or distended. No masses palpated. Extremities show no edema.  Varicosities noted. neuro grossly intact  A/P  1 hypertension-patient's blood pressure is controlled today.  We will continue present medications and follow.  2 bradycardia-she has had no recurrent bradycardia after discontinuing beta-blocker.  We will avoid AV nodal blocking agents in the future.  3 edema-we will continue with present dose of diuretic.  This is felt to be secondary to venous insufficiency.  Keep feet elevated, low-sodium diet and continue compression hose.  Kirk Ruths, MD

## 2018-06-29 ENCOUNTER — Encounter: Payer: Self-pay | Admitting: Cardiology

## 2018-06-29 ENCOUNTER — Ambulatory Visit (INDEPENDENT_AMBULATORY_CARE_PROVIDER_SITE_OTHER): Payer: Medicare HMO | Admitting: Cardiology

## 2018-06-29 VITALS — BP 132/56 | HR 70 | Ht 59.5 in | Wt 119.0 lb

## 2018-06-29 DIAGNOSIS — R001 Bradycardia, unspecified: Secondary | ICD-10-CM | POA: Diagnosis not present

## 2018-06-29 DIAGNOSIS — I10 Essential (primary) hypertension: Secondary | ICD-10-CM | POA: Diagnosis not present

## 2018-06-29 MED ORDER — TRIAMTERENE-HCTZ 37.5-25 MG PO TABS: 0.5000 | ORAL_TABLET | Freq: Every day | ORAL | 3 refills | Status: DC

## 2018-06-29 NOTE — Patient Instructions (Signed)
Your physician wants you to follow-up in: ONE YEAR WITH DR CRENSHAW You will receive a reminder letter in the mail two months in advance. If you don't receive a letter, please call our office to schedule the follow-up appointment.   If you need a refill on your cardiac medications before your next appointment, please call your pharmacy.  

## 2018-07-07 DIAGNOSIS — H18422 Band keratopathy, left eye: Secondary | ICD-10-CM | POA: Diagnosis not present

## 2018-07-07 DIAGNOSIS — H401113 Primary open-angle glaucoma, right eye, severe stage: Secondary | ICD-10-CM | POA: Diagnosis not present

## 2018-07-07 DIAGNOSIS — Z961 Presence of intraocular lens: Secondary | ICD-10-CM | POA: Diagnosis not present

## 2018-07-07 DIAGNOSIS — H44522 Atrophy of globe, left eye: Secondary | ICD-10-CM | POA: Diagnosis not present

## 2018-08-19 ENCOUNTER — Ambulatory Visit (INDEPENDENT_AMBULATORY_CARE_PROVIDER_SITE_OTHER): Payer: Medicare HMO | Admitting: Family Medicine

## 2018-08-19 VITALS — BP 144/66 | HR 64 | Temp 97.5°F | Resp 18 | Wt 119.8 lb

## 2018-08-19 DIAGNOSIS — I8390 Asymptomatic varicose veins of unspecified lower extremity: Secondary | ICD-10-CM | POA: Diagnosis not present

## 2018-08-19 DIAGNOSIS — L609 Nail disorder, unspecified: Secondary | ICD-10-CM | POA: Diagnosis not present

## 2018-08-19 DIAGNOSIS — Z170 Estrogen receptor positive status [ER+]: Secondary | ICD-10-CM | POA: Diagnosis not present

## 2018-08-19 DIAGNOSIS — I10 Essential (primary) hypertension: Secondary | ICD-10-CM

## 2018-08-19 DIAGNOSIS — E785 Hyperlipidemia, unspecified: Secondary | ICD-10-CM | POA: Diagnosis not present

## 2018-08-19 DIAGNOSIS — M8580 Other specified disorders of bone density and structure, unspecified site: Secondary | ICD-10-CM

## 2018-08-19 DIAGNOSIS — R739 Hyperglycemia, unspecified: Secondary | ICD-10-CM | POA: Diagnosis not present

## 2018-08-19 DIAGNOSIS — C50211 Malignant neoplasm of upper-inner quadrant of right female breast: Secondary | ICD-10-CM

## 2018-08-19 DIAGNOSIS — R000 Tachycardia, unspecified: Secondary | ICD-10-CM

## 2018-08-19 LAB — COMPREHENSIVE METABOLIC PANEL
ALBUMIN: 4.3 g/dL (ref 3.5–5.2)
ALK PHOS: 45 U/L (ref 39–117)
ALT: 16 U/L (ref 0–35)
AST: 18 U/L (ref 0–37)
BILIRUBIN TOTAL: 0.6 mg/dL (ref 0.2–1.2)
BUN: 27 mg/dL — ABNORMAL HIGH (ref 6–23)
CALCIUM: 10 mg/dL (ref 8.4–10.5)
CO2: 32 mEq/L (ref 19–32)
Chloride: 103 mEq/L (ref 96–112)
Creatinine, Ser: 1.02 mg/dL (ref 0.40–1.20)
GFR: 54.22 mL/min — AB (ref >60.00)
Glucose, Bld: 99 mg/dL (ref 70–99)
Potassium: 3.9 mEq/L (ref 3.5–5.1)
Sodium: 142 mEq/L (ref 135–145)
Total Protein: 6.4 g/dL (ref 6.0–8.3)

## 2018-08-19 LAB — CBC
HCT: 37.9 % (ref 36.0–46.0)
HEMOGLOBIN: 12.6 g/dL (ref 12.0–15.0)
MCHC: 33.2 g/dL (ref 30.0–36.0)
MCV: 94.7 fl (ref 78.0–100.0)
Platelets: 240 10*3/uL (ref 150.0–400.0)
RBC: 4 Mil/uL (ref 3.87–5.11)
RDW: 12.9 % (ref 11.5–15.5)
WBC: 5.6 10*3/uL (ref 4.0–10.5)

## 2018-08-19 LAB — HEMOGLOBIN A1C: Hgb A1c MFr Bld: 6.1 % (ref 4.6–6.5)

## 2018-08-19 LAB — LIPID PANEL
Cholesterol: 150 mg/dL (ref 0–200)
HDL: 58.3 mg/dL (ref >39.00)
LDL Cholesterol: 77 mg/dL (ref 0–99)
NonHDL: 91.85
TRIGLYCERIDES: 72 mg/dL (ref 0.0–149.0)
Total CHOL/HDL Ratio: 3
VLDL: 14.4 mg/dL (ref 0.0–40.0)

## 2018-08-19 LAB — TSH: TSH: 2.29 u[IU]/mL (ref 0.35–4.50)

## 2018-08-19 NOTE — Patient Instructions (Signed)

## 2018-08-19 NOTE — Assessment & Plan Note (Signed)
minimize simple carbs. Increase exercise as tolerated.  

## 2018-08-19 NOTE — Assessment & Plan Note (Addendum)
Well controlled, no changes to meds. Encouraged heart healthy diet such as the DASH diet and exercise as tolerated. Improved on recheck, she stopped the Amlodipine due to weakness and confusion and feels better since then. At home her systolic BP has been 441N to 130s will not restart the Amlodipine.

## 2018-08-19 NOTE — Assessment & Plan Note (Signed)
A couple of months of a dark nail on fourth finger

## 2018-08-19 NOTE — Assessment & Plan Note (Signed)
Encouraged heart healthy diet, increase exercise, avoid trans fats, consider a krill oil cap daily 

## 2018-08-21 DIAGNOSIS — I8390 Asymptomatic varicose veins of unspecified lower extremity: Secondary | ICD-10-CM | POA: Insufficient documentation

## 2018-08-21 NOTE — Assessment & Plan Note (Signed)
Becomes mildly inflamed at times. Encouraged compression hose and warm compresses report if worsens can consider referral to vascular surgeon.

## 2018-08-21 NOTE — Assessment & Plan Note (Signed)
Encouraged to get adequate exercise, calcium and vitamin d intake 

## 2018-08-21 NOTE — Assessment & Plan Note (Signed)
Has chosen not to take the Arimedex due to the possible side effects and the minimal benefit for longevity at her age. She will discuss with oncology at next visit

## 2018-08-21 NOTE — Progress Notes (Signed)
Subjective:    Patient ID: Amber Morgan, female    DOB: [birth year], 84 y.o.   MRN: [id number]  No chief complaint on file.   HPI Patient is in today for follow-up.  She is accompanied by her son.  She feels well today.  She stopped amlodipine due to feeling weak and confused.  Upon stopping she is felt much better and has no acute complaints today.  Continues to struggle with inflammation in her varicose veins intermittently but it is tolerable.  She has some mild swelling in that leg at times as well.  She is following with general surgery and oncology for breast cancer and doing well.  She is chosen not to start Arimedex due to the potential side effects. Denies CP/palp/SOB/HA/congestion/fevers/GI or GU c/o. Taking meds as prescribed. BP at home tanging 111-133/50-70.  Past Medical History:  Diagnosis Date  . Allergy   . Arthritis   . Cancer [facility])    breast cancer   . Colon polyps   . Diverticulitis   . Diverticulosis 01/04/2014  . Glaucoma   . Hernia, inguinal, right   . History of chicken pox    childhood  . History of hiatal hernia   . HTN (hypertension)   . Hyperglycemia 09/05/2013  . Lipoma of arm 12/09/2014   right  . Nocturia 02/08/2013  . Osteopenia 12/09/2014  . Personal history of colonic polyps 09/05/2013  . Pre-diabetes   . Preventative health care 08/14/2016  . Unspecified constipation 02/06/2013  . Urinary incontinence   . Viral infection characterized by skin and mucous membrane lesions 05/11/2013  . Wears glasses     Past Surgical History:  Procedure Laterality Date  . ABDOMINAL HYSTERECTOMY    . APPENDECTOMY    . BLADDER SURGERY  2003   bladder tact  . BREAST LUMPECTOMY WITH RADIOACTIVE SEED LOCALIZATION Right 04/12/2018   Procedure: BREAST LUMPECTOMY WITH RADIOACTIVE SEED LOCALIZATION;  Surgeon: [name], MD;  Location: [place];  Service: General;  Laterality: Right;  . CATARACT EXTRACTION, BILATERAL    . COLONOSCOPY W/ BIOPSIES AND POLYPECTOMY    . EYE  SURGERY     multiple bilateral  . INGUINAL HERNIA REPAIR Right 04/12/2018   Procedure: OPEN RIGHT INGUINAL HERNIA REPAIR WITH MESH;  Surgeon: [name], MD;  Location: [place];  Service: General;  Laterality: Right;  . RECTOCELE REPAIR Bilateral     Family History  Problem Relation Age of Onset  . Colon cancer Mother   . Hypertension Mother   . Heart disease Father   . Meniere's disease Father   . Heart disease Sister   . Arthritis Son        back  . Glaucoma Son   . Hypertension Son   . Cataracts Son   . Colon cancer Sister   . Alzheimer's disease Sister   . Glaucoma Sister   . Glaucoma Sister   . Hyperlipidemia Sister   . Hypertension Sister   . Cancer Sister        thyroid  . Diabetes Sister   . Glaucoma Sister     Social History   Socioeconomic History  . Marital status: Married    Spouse name: Not on file  . Number of children: 4  . Years of education: Not on file  . Highest education level: Not on file  Occupational History  . Not on file  Social Needs  . Financial resource strain: Not on file  . Food insecurity:  Worry: Not on file    Inability: Not on file  . Transportation needs:    Medical: Not on file    Non-medical: Not on file  Tobacco Use  . Smoking status: Never Smoker  . Smokeless tobacco: Never Used  Substance and Sexual Activity  . Alcohol use: No  . Drug use: No  . Sexual activity: Not Currently  Lifestyle  . Physical activity:    Days per week: Not on file    Minutes per session: Not on file  . Stress: Not on file  Relationships  . Social connections:    Talks on phone: Not on file    Gets together: Not on file    Attends religious service: Not on file    Active member of club or organization: Not on file    Attends meetings of clubs or organizations: Not on file    Relationship status: Not on file  . Intimate partner violence:    Fear of current or ex partner: Not on file    Emotionally abused: Not on file    Physically  abused: Not on file    Forced sexual activity: Not on file  Other Topics Concern  . Not on file  Social History Narrative  . Not on file    Outpatient Medications Prior to Visit  Medication Sig Dispense Refill  . Artificial Tear Solution (GENTEAL TEARS OP) Place 1 drop into both eyes 2 (two) times daily.    Marland Kitchen aspirin EC 81 MG tablet Take 81 mg by mouth daily.    . cholecalciferol (VITAMIN D) 1000 units tablet Take 1 tablet (1,000 Units total) by mouth daily.    . Fiber POWD Take 1 Scoop by mouth daily.    . Multiple Vitamins-Minerals (ONE-A-DAY WOMENS 50 PLUS PO) Take 1 each by mouth daily.    . polyethylene glycol (MIRALAX / GLYCOLAX) packet Take 17 g by mouth 2 (two) times daily.    . potassium chloride SA (K-DUR,KLOR-CON) 20 MEQ tablet TAKE 1 TABLET BY MOUTH ONCE DAILY 90 tablet 1  . Probiotic Product (PROBIOTIC DAILY PO) Take 1 capsule by mouth daily.     Marland Kitchen triamterene-hydrochlorothiazide (MAXZIDE-25) 37.5-25 MG tablet Take 0.5 tablets by mouth daily. 45 tablet 3  . amLODipine (NORVASC) 2.5 MG tablet Take 1 tablet (2.5 mg total) by mouth daily. (Patient not taking: Reported on 08/19/2018) 90 tablet 1  . anastrozole (ARIMIDEX) 1 MG tablet Take 1 tablet (1 mg total) by mouth daily. (Patient not taking: Reported on 06/07/2018) 90 tablet 4   No facility-administered medications prior to visit.     Allergies  Allergen Reactions  . Amlodipine Other (See Comments)    Mental status chnages ie confusion  . Doxycycline Rash    Review of Systems  Constitutional: Negative for fever and malaise/fatigue.  HENT: Negative for congestion.   Eyes: Negative for blurred vision.  Respiratory: Negative for shortness of breath.   Cardiovascular: Positive for leg swelling. Negative for chest pain and palpitations.  Gastrointestinal: Negative for abdominal pain, blood in stool and nausea.  Genitourinary: Negative for dysuria and frequency.  Musculoskeletal: Negative for falls.  Skin: Negative for  rash.  Neurological: Negative for dizziness, loss of consciousness and headaches.  Endo/Heme/Allergies: Negative for environmental allergies.  Psychiatric/Behavioral: Negative for depression. The patient is not nervous/anxious.        Objective:    Physical Exam  BP (!) 144/66   Pulse 64   Temp (!) 97.5 F (36.4 C) (Oral)  Resp 18   Wt 119 lb 12.8 oz (54.3 kg)   SpO2 98%   BMI 23.79 kg/m  Wt Readings from Last 3 Encounters:  08/19/18 119 lb 12.8 oz (54.3 kg)  06/29/18 119 lb (54 kg)  06/07/18 119 lb (54 kg)     Lab Results  Component Value Date   WBC 5.6 08/19/2018   HGB 12.6 08/19/2018   HCT 37.9 08/19/2018   PLT 240.0 08/19/2018   GLUCOSE 99 08/19/2018   CHOL 150 08/19/2018   TRIG 72.0 08/19/2018   HDL 58.30 08/19/2018   LDLCALC 77 08/19/2018   ALT 16 08/19/2018   Morgan 18 08/19/2018   NA 142 08/19/2018   K 3.9 08/19/2018   CL 103 08/19/2018   CREATININE 1.02 08/19/2018   BUN 27 (H) 08/19/2018   CO2 32 08/19/2018   TSH 2.29 08/19/2018   HGBA1C 6.1 08/19/2018   MICROALBUR 0.8 08/06/2016    Lab Results  Component Value Date   TSH 2.29 08/19/2018   Lab Results  Component Value Date   WBC 5.6 08/19/2018   HGB 12.6 08/19/2018   HCT 37.9 08/19/2018   MCV 94.7 08/19/2018   PLT 240.0 08/19/2018   Lab Results  Component Value Date   NA 142 08/19/2018   K 3.9 08/19/2018   CO2 32 08/19/2018   GLUCOSE 99 08/19/2018   BUN 27 (H) 08/19/2018   CREATININE 1.02 08/19/2018   BILITOT 0.6 08/19/2018   ALKPHOS 45 08/19/2018   Morgan 18 08/19/2018   ALT 16 08/19/2018   PROT 6.4 08/19/2018   ALBUMIN 4.3 08/19/2018   CALCIUM 10.0 08/19/2018   ANIONGAP 7 03/30/2018   GFR 54.22 (L) 08/19/2018   Lab Results  Component Value Date   CHOL 150 08/19/2018   Lab Results  Component Value Date   HDL 58.30 08/19/2018   Lab Results  Component Value Date   LDLCALC 77 08/19/2018   Lab Results  Component Value Date   TRIG 72.0 08/19/2018   Lab Results    Component Value Date   CHOLHDL 3 08/19/2018   Lab Results  Component Value Date   HGBA1C 6.1 08/19/2018       Assessment & Plan:   Problem List Items Addressed This Visit    HTN (hypertension)    Well controlled, no changes to meds. Encouraged heart healthy diet such as the DASH diet and exercise as tolerated. Improved on recheck, she stopped the Amlodipine due to weakness and confusion and feels better since then. At home her systolic BP has been 509T to 130s will not restart the Amlodipine.       Relevant Orders   CBC (Completed)   Comprehensive metabolic panel (Completed)   TSH (Completed)   Hyperlipidemia    Encouraged heart healthy diet, increase exercise, avoid trans fats, consider a krill oil cap daily      Relevant Orders   Lipid panel (Completed)   Hyperglycemia    minimize simple carbs. Increase exercise as tolerated.      Relevant Orders   Hemoglobin A1c (Completed)   Osteopenia    Encouraged to get adequate exercise, calcium and vitamin d intake      Tachycardia    RRR today      Malignant neoplasm of upper-inner quadrant of right breast in female, estrogen receptor positive ([place])    Has chosen not to take the Arimedex due to the possible side effects and the minimal benefit for longevity at her age. She will discuss  with oncology at next visit      Nail lesion    A couple of months of a dark nail on fourth finger      Varicose veins of lower extremity - Primary    Becomes mildly inflamed at times. Encouraged compression hose and warm compresses report if worsens can consider referral to vascular surgeon.         I have discontinued Tayia M. Gebbia's anastrozole. I am also having her maintain her Multiple Vitamins-Minerals (ONE-A-DAY WOMENS 50 PLUS PO), Probiotic Product (PROBIOTIC DAILY PO), amLODipine, potassium chloride SA, polyethylene glycol, Artificial Tear Solution (GENTEAL TEARS OP), Fiber, cholecalciferol, aspirin EC, and  triamterene-hydrochlorothiazide.  No orders of the defined types were placed in this encounter.    Penni Homans, MD

## 2018-08-21 NOTE — Assessment & Plan Note (Signed)
RRR today 

## 2018-08-28 NOTE — Progress Notes (Signed)
[place]  [phone] [phone]     ID: Amber Morgan DOB: [birth year]  MR#: [id number]  YBW#:[id number]  Patient Care Team: Mosie Lukes, MD as PCP - General (Family Medicine) Jerline Pain Mingo Amber, DO as Consulting Physician (Osteopathic Medicine) Rolan Lipa, MD as Consulting Physician (Gastroenterology) Stanford Breed Denice Bors, MD as Consulting Physician (Cardiology) [name], MD as Consulting Physician (General Surgery) Bernal Luhman, Virgie Dad, MD as Consulting Physician (Oncology) Eppie Gibson, MD as Attending Physician (Radiation Oncology) OTHER MD:  CHIEF COMPLAINT: Estrogen receptor positive lobular breast cancer  CURRENT TREATMENT: Observation   HISTORY OF CURRENT ILLNESS: From the original intake note:  Amber Morgan had routine screening mammography on 03/03/2018 showing a possible abnormality in the right breast. She underwent unilateral right diagnostic mammography with tomography and right breast ultrasonography at [facility] on 03/14/2018 showing: breast density category C. In the right breast, at the 1 o'clock upper inner quadrant there is a hypoechoic mass measuring 0.9 x 0.9 x 0.7 cm located 5 cm from the nipple. Ultrasonography revealed no evidence of lymphadenopathy in the right axilla.   Accordingly on 03/18/2018 she proceeded to biopsy of the right breast area in question. The pathology from this procedure showed ([id number]): Invasive lobular carcinoma grade II, spanning 0.6 cm. Prognostic indicators significant for: estrogen receptor, 100% positive and progesterone receptor, 100% positive, both with strong staining intensity. Proliferation marker Ki67 at 2%. HER2 not amplified with ratios HER2/CEP17 signals 1.62 and average HER2 copies per cell 3.80  The patient's subsequent history is as detailed below.  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 If she were younger she would take it.   REVIEW OF SYSTEMS: Amber Morgan reports that for exercise, she is mainly doing yard work. She denies unusual headaches, visual changes, nausea, vomiting, or dizziness. There has been no unusual cough, phlegm production, or pleurisy. This been no change in bowel or bladder habits. She denies unexplained fatigue or unexplained weight loss, bleeding, rash, or fever. A detailed review of systems was otherwise stable.    PAST MEDICAL HISTORY: Past Medical History:  Diagnosis Date  . Allergy   . Arthritis   . Cancer [facility])    breast cancer   . Colon polyps   . Diverticulitis   . Diverticulosis 01/04/2014  . Glaucoma   . Hernia, inguinal, right   . History of chicken pox    childhood  . History of hiatal hernia   . HTN (hypertension)   . Hyperglycemia 09/05/2013  . Lipoma of arm 12/09/2014   right  . Nocturia 02/08/2013  . Osteopenia 12/09/2014  . Personal history of colonic polyps 09/05/2013  . Pre-diabetes   . Preventative health care 08/14/2016  . Unspecified constipation 02/06/2013  . Urinary incontinence   . Viral infection characterized by skin and mucous membrane lesions 05/11/2013  . Wears glasses   Glaucoma  PAST SURGICAL HISTORY: Past Surgical History:  Procedure Laterality Date  . ABDOMINAL HYSTERECTOMY    . APPENDECTOMY    . BLADDER SURGERY  2003   bladder tact  . BREAST LUMPECTOMY WITH RADIOACTIVE SEED LOCALIZATION Right 04/12/2018   Procedure: BREAST LUMPECTOMY WITH RADIOACTIVE SEED LOCALIZATION;  Surgeon: Barry Dienes,  Dorris Fetch, MD;  Location: [facility];  Service: General;  Laterality: Right;  . CATARACT EXTRACTION, BILATERAL    . COLONOSCOPY W/ BIOPSIES AND POLYPECTOMY    . EYE SURGERY     multiple bilateral  . INGUINAL HERNIA  REPAIR Right 04/12/2018   Procedure: OPEN RIGHT INGUINAL HERNIA REPAIR WITH MESH;  Surgeon: [name], MD;  Location: [facility];  Service: General;  Laterality: Right;  . RECTOCELE REPAIR Bilateral   Appendectomy and planned Cholescystectomy  FAMILY HISTORY Family History  Problem Relation Age of Onset  . Colon cancer Mother   . Hypertension Mother   . Heart disease Father   . Meniere's disease Father   . Heart disease Sister   . Arthritis Son        back  . Glaucoma Son   . Hypertension Son   . Cataracts Son   . Colon cancer Sister   . Alzheimer's disease Sister   . Glaucoma Sister   . Glaucoma Sister   . Hyperlipidemia Sister   . Hypertension Sister   . Cancer Sister        thyroid  . Diabetes Sister   . Glaucoma Sister   The patient's father died at age 69 due to MI. The patient's mother died at 68. The patient's mother had colon cancer diagnosed at age 17. There was a paternal aunt with breast cancer diagnosed at an old age. The patient's sister was diagnosed with colon cancer at age 2. She denies a history of ovarian cancer in the family.    GYNECOLOGIC HISTORY:  No LMP recorded. Patient has had a hysterectomy. Menarche: 84 years old Age at first live birth: 84 years old She is GXP4. She is status post total hysterectomy with bilateral salpingo-oophorectomy in 1978. The patient briefly took HRT, and she didn't have intense hot flashes. She never used contraception.    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 Her husband died in 04-04-18 due to lung issues. At home is herself with no pets.  She is going to move to [facility] [address] in March 2020. The patient's son, Amber Morgan is retired from Research officer, trade union and [place]. The patient's son Amber Morgan is retired after 30 years in the [employer]. The patient's son, Amber Morgan is a Designer, industrial/product in [place]. The patient's son Amber Morgan, is an Scientist, research (physical sciences) in [place]. The patient has 8 grandchildren and 8 great-  grandchildren. The patient attends [employer].      ADVANCED DIRECTIVES: The patient's son- Amber Morgan [phone] is her healthcare power of attorney   HEALTH MAINTENANCE: Social History   Tobacco Use  . Smoking status: Never Smoker  . Smokeless tobacco: Never Used  Substance Use Topics  . Alcohol use: No  . Drug use: No     Colonoscopy: 12/2013 / Dr. Sherrye Payor / polyp removal  PAP:   Bone density: 12/18/16 T score of -0.6   Allergies  Allergen Reactions  . Amlodipine Other (See Comments)    Mental status chnages ie confusion  . Doxycycline Rash    Current Outpatient Medications  Medication Sig Dispense Refill  . amLODipine (NORVASC) 2.5 MG tablet Take 1 tablet (2.5 mg total) by mouth daily. (Patient not taking: Reported on 08/19/2018) 90 tablet 1  . Artificial Tear Solution (GENTEAL TEARS OP) Place 1 drop into both eyes 2 (two) times daily.    Marland Kitchen aspirin EC 81 MG tablet Take 81 mg by mouth daily.    . cholecalciferol (VITAMIN  D) 1000 units tablet Take 1 tablet (1,000 Units total) by mouth daily.    . Fiber POWD Take 1 Scoop by mouth daily.    . Multiple Vitamins-Minerals (ONE-A-DAY WOMENS 50 PLUS PO) Take 1 each by mouth daily.    . polyethylene glycol (MIRALAX / GLYCOLAX) packet Take 17 g by mouth 2 (two) times daily.    . potassium chloride SA (K-DUR,KLOR-CON) 20 MEQ tablet TAKE 1 TABLET BY MOUTH ONCE DAILY 90 tablet 1  . Probiotic Product (PROBIOTIC DAILY PO) Take 1 capsule by mouth daily.     Marland Kitchen triamterene-hydrochlorothiazide (MAXZIDE-25) 37.5-25 MG tablet Take 0.5 tablets by mouth daily. 45 tablet 3   No current facility-administered medications for this visit.     OBJECTIVE: Older white woman who appears stated age  67:   08/29/18 1458  BP: (!) 149/72  Pulse: 83  Resp: 18  Temp: 98.3 F (36.8 C)  SpO2: 98%     Body mass index is 24.17 kg/m.   Wt Readings from Last 3 Encounters:  08/29/18 121 lb 11.2 oz (55.2 kg)  08/19/18 119 lb  12.8 oz (54.3 kg)  06/29/18 119 lb (54 kg)      ECOG FS:1 - Symptomatic but completely ambulatory  Sclerae unicteric, left eye blind No cervical or supraclavicular adenopathy Lungs no rales or rhonchi Heart regular rate and rhythm Abd soft, nontender, positive bowel sounds MSK no focal spinal tenderness, no upper extremity lymphedema Neuro: nonfocal, well oriented, appropriate affect Breasts: The right breast is status post lumpectomy.  The cosmetic result is excellent.  There is no evidence of residual or recurrent disease.  The left breast is benign.  Both axillae are benign.  LAB RESULTS:  CMP     Component Value Date/Time   NA 142 08/19/2018 1142   K 3.9 08/19/2018 1142   CL 103 08/19/2018 1142   CO2 32 08/19/2018 1142   GLUCOSE 99 08/19/2018 1142   BUN 27 (H) 08/19/2018 1142   CREATININE 1.02 08/19/2018 1142   CREATININE 0.97 03/30/2018 1214   CREATININE 0.79 05/25/2014 0853   CALCIUM 10.0 08/19/2018 1142   PROT 6.4 08/19/2018 1142   ALBUMIN 4.3 08/19/2018 1142   Morgan 18 08/19/2018 1142   Morgan 20 03/30/2018 1214   ALT 16 08/19/2018 1142   ALT 21 03/30/2018 1214   ALKPHOS 45 08/19/2018 1142   BILITOT 0.6 08/19/2018 1142   BILITOT 0.3 03/30/2018 1214   GFRNONAA 51 (L) 03/30/2018 1214   GFRAA 59 (L) 03/30/2018 1214    No results found for: TOTALPROTELP, ALBUMINELP, A1GS, A2GS, BETS, BETA2SER, GAMS, MSPIKE, SPEI  No results found for: KPAFRELGTCHN, LAMBDASER, KAPLAMBRATIO  Lab Results  Component Value Date   WBC 5.6 08/19/2018   NEUTROABS 3.7 03/30/2018   HGB 12.6 08/19/2018   HCT 37.9 08/19/2018   MCV 94.7 08/19/2018   PLT 240.0 08/19/2018    @LASTCHEMISTRY @  No results found for: LABCA2  No components found for: DEYCXK481  No results for input(s): INR in the last 168 hours.  No results found for: LABCA2  No results found for: EHU314  No results found for: HFW263  No results found for: ZCH885  No results found for: CA2729  No components found  for: HGQUANT  No results found for: CEA1 / No results found for: CEA1   No results found for: AFPTUMOR  No results found for: CHROMOGRNA  No results found for: PSA1  No visits with results within 3 Day(s) from this visit.  Latest  known visit with results is:  Office Visit on 08/19/2018  Component Date Value Ref Range Status  . WBC 08/19/2018 5.6  4.0 - 10.5 K/uL Final  . RBC 08/19/2018 4.00  3.87 - 5.11 Mil/uL Final  . Platelets 08/19/2018 240.0  150.0 - 400.0 K/uL Final  . Hemoglobin 08/19/2018 12.6  12.0 - 15.0 g/dL Final  . HCT 08/19/2018 37.9  36.0 - 46.0 % Final  . MCV 08/19/2018 94.7  78.0 - 100.0 fl Final  . MCHC 08/19/2018 33.2  30.0 - 36.0 g/dL Final  . RDW 08/19/2018 12.9  11.5 - 15.5 % Final  . Sodium 08/19/2018 142  135 - 145 mEq/L Final  . Potassium 08/19/2018 3.9  3.5 - 5.1 mEq/L Final  . Chloride 08/19/2018 103  96 - 112 mEq/L Final  . CO2 08/19/2018 32  19 - 32 mEq/L Final  . Glucose, Bld 08/19/2018 99  70 - 99 mg/dL Final  . BUN 08/19/2018 27* 6 - 23 mg/dL Final  . Creatinine, Ser 08/19/2018 1.02  0.40 - 1.20 mg/dL Final  . Total Bilirubin 08/19/2018 0.6  0.2 - 1.2 mg/dL Final  . Alkaline Phosphatase 08/19/2018 45  39 - 117 U/L Final  . Morgan 08/19/2018 18  0 - 37 U/L Final  . ALT 08/19/2018 16  0 - 35 U/L Final  . Total Protein 08/19/2018 6.4  6.0 - 8.3 g/dL Final  . Albumin 08/19/2018 4.3  3.5 - 5.2 g/dL Final  . Calcium 08/19/2018 10.0  8.4 - 10.5 mg/dL Final  . GFR 08/19/2018 54.22* >60.00 mL/min Final  . Cholesterol 08/19/2018 150  0 - 200 mg/dL Final   ATP III Classification       Desirable:  < 200 mg/dL               Borderline High:  200 - 239 mg/dL          High:  > = 240 mg/dL  . Triglycerides 08/19/2018 72.0  0.0 - 149.0 mg/dL Final   Normal:  <150 mg/dLBorderline High:  150 - 199 mg/dL  . HDL 08/19/2018 58.30  >39.00 mg/dL Final  . VLDL 08/19/2018 14.4  0.0 - 40.0 mg/dL Final  . LDL Cholesterol 08/19/2018 77  0 - 99 mg/dL Final  . Total CHOL/HDL  Ratio 08/19/2018 3   Final                  Men          Women1/2 Average Risk     3.4          3.3Average Risk          5.0          4.42X Average Risk          9.6          7.13X Average Risk          15.0          11.0                      . NonHDL 08/19/2018 91.85   Final   NOTE:  Non-HDL goal should be 30 mg/dL higher than patient's LDL goal (i.e. LDL goal of < 70 mg/dL, would have non-HDL goal of < 100 mg/dL)  . TSH 08/19/2018 2.29  0.35 - 4.50 uIU/mL Final  . Hgb A1c MFr Bld 08/19/2018 6.1  4.6 - 6.5 % Final   Glycemic Control Guidelines for People  with Diabetes:Non Diabetic:  <6%Goal of Therapy: <7%Additional Action Suggested:  >8%     (this displays the last labs from the last 3 days)  No results found for: TOTALPROTELP, ALBUMINELP, A1GS, A2GS, BETS, BETA2SER, GAMS, MSPIKE, SPEI (this displays SPEP labs)  No results found for: KPAFRELGTCHN, LAMBDASER, KAPLAMBRATIO (kappa/lambda light chains)  No results found for: HGBA, HGBA2QUANT, HGBFQUANT, HGBSQUAN (Hemoglobinopathy evaluation)   No results found for: LDH  No results found for: IRON, TIBC, IRONPCTSAT (Iron and TIBC)  No results found for: FERRITIN  Urinalysis    Component Value Date/Time   COLORURINE YELLOW 12/10/2017 1442   APPEARANCEUR Cloudy (A) 12/10/2017 1442   LABSPEC 1.015 12/10/2017 1442   PHURINE 6.5 12/10/2017 1442   GLUCOSEU NEGATIVE 12/10/2017 1442   HGBUR TRACE-INTACT (A) 12/10/2017 1442   BILIRUBINUR NEGATIVE 12/10/2017 1442   BILIRUBINUR negative 02/02/2017 0811   KETONESUR NEGATIVE 12/10/2017 1442   PROTEINUR 1 02/02/2017 0811   PROTEINUR NEG 02/06/2013 1053   UROBILINOGEN 0.2 12/10/2017 1442   NITRITE NEGATIVE 12/10/2017 1442   LEUKOCYTESUR LARGE (A) 12/10/2017 1442     STUDIES: No results found.  ELIGIBLE FOR AVAILABLE RESEARCH PROTOCOL: no  ASSESSMENT: 84 y.o. [place], [place] woman status post right breast upper inner quadrant biopsy 03/18/2018 for a clinical T1b No, stage IA base of  lobular carcinoma, grade 2, estrogen and progesterone receptor positive, HER-2 not amplified, with an MIB-1 of 2%.  (1) status post right lumpectomy with no sentinel lymph node sampling 04/12/2018 for a pT1c cN0, stage IA invasive lobular carcinoma, with negative margins  (2) no adjuvant radiation indicated  (3) anastrozole prescribed 05/20/2018 but never started by the patient  (a) bone density 03/03/2018 finds a T score of -1.8 (osteopenia).  PLAN: Zamari has decided against antiestrogens.  She understands the standard of care is surgery followed by radiation then antiestrogens.  In women over 8 with favorable pathology radiation can be safely omitted if the patient takes antiestrogens for 5 years.  If she does neither radiation nor antiestrogens which is what she plans then she does have more of a risk of local recurrence which I quoted her in the 30% range.  That may be overstating it somewhat given her age but she does seem fairly healthfully and she could live another 10 years.  She is very settled in her decision and is not uncomfortable with a 70% chance of not having a recurrence in the right breast  Accordingly the plan will be for observation alone.  She will have mammography in June.  I will see her again in July.  I think she will feel better in [place], making new friends, and joining the gym and other activities there  We did discuss falls and I suggested she might consider starting to use a cane as since falls are 1 of the major risk factors for death in women her age.  She knows to call for any other issues that may develop before the next visit.   Yandel Zeiner, Virgie Dad, MD  08/29/18 3:03 PM Medical Oncology and Hematology [facility] [address] [place], [place] [address] Tel. [phone]    Fax. [phone]    Elie Goody, am acting as scribe for Dr. Virgie Dad. Tashiana Lamarca.  I, Lurline Del MD, have reviewed the above documentation for  accuracy and completeness, and I agree with the above.

## 2018-08-29 ENCOUNTER — Telehealth: Payer: Self-pay | Admitting: Oncology

## 2018-08-29 ENCOUNTER — Inpatient Hospital Stay: Payer: Medicare HMO | Attending: Oncology | Admitting: Oncology

## 2018-08-29 VITALS — BP 149/72 | HR 83 | Temp 98.3°F | Resp 18 | Ht 59.5 in | Wt 121.7 lb

## 2018-08-29 DIAGNOSIS — Z90722 Acquired absence of ovaries, bilateral: Secondary | ICD-10-CM | POA: Diagnosis not present

## 2018-08-29 DIAGNOSIS — Z170 Estrogen receptor positive status [ER+]: Secondary | ICD-10-CM | POA: Diagnosis not present

## 2018-08-29 DIAGNOSIS — Z9079 Acquired absence of other genital organ(s): Secondary | ICD-10-CM | POA: Insufficient documentation

## 2018-08-29 DIAGNOSIS — Z90710 Acquired absence of both cervix and uterus: Secondary | ICD-10-CM | POA: Diagnosis not present

## 2018-08-29 DIAGNOSIS — Z7982 Long term (current) use of aspirin: Secondary | ICD-10-CM | POA: Diagnosis not present

## 2018-08-29 DIAGNOSIS — I10 Essential (primary) hypertension: Secondary | ICD-10-CM | POA: Insufficient documentation

## 2018-08-29 DIAGNOSIS — C50211 Malignant neoplasm of upper-inner quadrant of right female breast: Secondary | ICD-10-CM

## 2018-08-29 DIAGNOSIS — Z79899 Other long term (current) drug therapy: Secondary | ICD-10-CM | POA: Diagnosis not present

## 2018-08-29 NOTE — Telephone Encounter (Signed)
Patient decline avs and calendar °

## 2018-09-02 ENCOUNTER — Telehealth: Payer: Self-pay | Admitting: Family Medicine

## 2018-09-02 NOTE — Telephone Encounter (Signed)
Pt calling for results that were drawn on 08/19/18. Pt states she was told previously that lab were results were mailed on 08/29/18. Pt states she has not received mailed copy of results.  Address on file confirmed.Pt given results per notes of Dr. Charlett Blake on 11/16.  Pt given HgbA1 and potassium results. Pt verbalized understanding.Unable to document in result note.

## 2018-09-08 ENCOUNTER — Other Ambulatory Visit: Payer: Self-pay

## 2018-09-08 MED ORDER — POTASSIUM CHLORIDE CRYS ER 20 MEQ PO TBCR: 20.0000 meq | EXTENDED_RELEASE_TABLET | Freq: Every day | ORAL | 1 refills | Status: DC

## 2018-09-09 ENCOUNTER — Other Ambulatory Visit: Payer: Self-pay | Admitting: *Deleted

## 2018-09-09 MED ORDER — TRIAMTERENE-HCTZ 37.5-25 MG PO TABS: 0.5000 | ORAL_TABLET | Freq: Every day | ORAL | 3 refills | Status: DC

## 2018-10-10 DIAGNOSIS — H18422 Band keratopathy, left eye: Secondary | ICD-10-CM | POA: Diagnosis not present

## 2018-10-10 DIAGNOSIS — Z961 Presence of intraocular lens: Secondary | ICD-10-CM | POA: Diagnosis not present

## 2018-10-10 DIAGNOSIS — H43811 Vitreous degeneration, right eye: Secondary | ICD-10-CM | POA: Diagnosis not present

## 2018-10-10 DIAGNOSIS — H44522 Atrophy of globe, left eye: Secondary | ICD-10-CM | POA: Diagnosis not present

## 2018-10-10 DIAGNOSIS — H401113 Primary open-angle glaucoma, right eye, severe stage: Secondary | ICD-10-CM | POA: Diagnosis not present

## 2018-10-14 DIAGNOSIS — L700 Acne vulgaris: Secondary | ICD-10-CM | POA: Diagnosis not present

## 2018-10-14 DIAGNOSIS — L608 Other nail disorders: Secondary | ICD-10-CM | POA: Diagnosis not present

## 2018-10-27 ENCOUNTER — Encounter: Payer: Self-pay | Admitting: Medical

## 2018-10-27 ENCOUNTER — Ambulatory Visit (INDEPENDENT_AMBULATORY_CARE_PROVIDER_SITE_OTHER): Payer: Medicare HMO | Admitting: Medical

## 2018-10-27 ENCOUNTER — Ambulatory Visit (HOSPITAL_BASED_OUTPATIENT_CLINIC_OR_DEPARTMENT_OTHER)
Admission: RE | Admit: 2018-10-27 | Discharge: 2018-10-27 | Disposition: A | Discharge status: Home / Self Care | Payer: Medicare HMO | Source: Ambulatory Visit | Attending: Medical | Admitting: Medical

## 2018-10-27 ENCOUNTER — Telehealth: Payer: Self-pay | Admitting: Medical

## 2018-10-27 VITALS — BP 140/68 | HR 67 | Temp 97.8°F | Resp 16 | Ht 59.5 in | Wt 121.0 lb

## 2018-10-27 DIAGNOSIS — I10 Essential (primary) hypertension: Secondary | ICD-10-CM | POA: Diagnosis not present

## 2018-10-27 DIAGNOSIS — K5900 Constipation, unspecified: Secondary | ICD-10-CM | POA: Diagnosis not present

## 2018-10-27 DIAGNOSIS — R109 Unspecified abdominal pain: Secondary | ICD-10-CM | POA: Diagnosis not present

## 2018-10-27 DIAGNOSIS — R1031 Right lower quadrant pain: Secondary | ICD-10-CM | POA: Diagnosis not present

## 2018-10-27 DIAGNOSIS — R3915 Urgency of urination: Secondary | ICD-10-CM

## 2018-10-27 LAB — CBC WITH DIFFERENTIAL/PLATELET
BASOS ABS: 0 10*3/uL (ref 0.0–0.1)
Basophils Relative: 0.5 % (ref 0.0–3.0)
EOS ABS: 0.1 10*3/uL (ref 0.0–0.7)
Eosinophils Relative: 1.7 % (ref 0.0–5.0)
HCT: 38.7 % (ref 36.0–46.0)
Hemoglobin: 12.9 g/dL (ref 12.0–15.0)
LYMPHS ABS: 1.6 10*3/uL (ref 0.7–4.0)
Lymphocytes Relative: 25.3 % (ref 12.0–46.0)
MCHC: 33.3 g/dL (ref 30.0–36.0)
MCV: 95.3 fl (ref 78.0–100.0)
MONO ABS: 0.5 10*3/uL (ref 0.1–1.0)
Monocytes Relative: 8.5 % (ref 3.0–12.0)
NEUTROS ABS: 4 10*3/uL (ref 1.4–7.7)
NEUTROS PCT: 64 % (ref 43.0–77.0)
PLATELETS: 250 10*3/uL (ref 150.0–400.0)
RBC: 4.07 Mil/uL (ref 3.87–5.11)
RDW: 13.3 % (ref 11.5–15.5)
WBC: 6.3 10*3/uL (ref 4.0–10.5)

## 2018-10-27 LAB — POC URINALSYSI DIPSTICK (AUTOMATED)
BILIRUBIN UA: NEGATIVE
Glucose, UA: NEGATIVE
Ketones, UA: NEGATIVE
Leukocytes, UA: NEGATIVE
NITRITE UA: NEGATIVE
PH UA: 6.5 (ref 5.0–8.0)
Protein, UA: NEGATIVE
RBC UA: NEGATIVE
Spec Grav, UA: 1.015 (ref 1.010–1.025)
UROBILINOGEN UA: NEGATIVE U/dL — AB

## 2018-10-27 LAB — COMPREHENSIVE METABOLIC PANEL
ALT: 17 U/L (ref 0–35)
AST: 18 U/L (ref 0–37)
Albumin: 4.4 g/dL (ref 3.5–5.2)
Alkaline Phosphatase: 51 U/L (ref 39–117)
BILIRUBIN TOTAL: 0.5 mg/dL (ref 0.2–1.2)
BUN: 23 mg/dL (ref 6–23)
CO2: 32 meq/L (ref 19–32)
CREATININE: 1 mg/dL (ref 0.40–1.20)
Calcium: 10.8 mg/dL — ABNORMAL HIGH (ref 8.4–10.5)
Chloride: 101 mEq/L (ref 96–112)
GFR: 52.17 mL/min — ABNORMAL LOW (ref >60.00)
Glucose, Bld: 112 mg/dL — ABNORMAL HIGH (ref 70–99)
Potassium: 5.3 mEq/L — ABNORMAL HIGH (ref 3.5–5.1)
Sodium: 139 mEq/L (ref 135–145)
Total Protein: 6.9 g/dL (ref 6.0–8.3)

## 2018-10-27 MED ORDER — LINACLOTIDE 145 MCG PO CAPS: 145.0000 ug | ORAL_CAPSULE | Freq: Every day | ORAL | 2 refills | Status: DC

## 2018-10-27 NOTE — Addendum Note (Signed)
Addended by: Hinton Dyer on: 10/27/2018 01:27 PM   Modules accepted: Orders

## 2018-10-27 NOTE — Progress Notes (Signed)
Subjective:    Patient ID: Amber Morgan, female    DOB: [birth year], 85 y.o.   MRN: [id number]  HPI  Pt in for some feeling of constipation. She has to take miralax daily.  Pt state some constipation since around July 2019 per pt.  Pt last bowel movement was this morning. Pt took miralax 3 times yesterday. Pt stool this morning. She states small amount and soft stool not hard. Pt has some pain only in rt lower quadrant. She thinks this area of former hernia surgery. This was present for about one week. Pain has been on and off.   Pt does not report pain increase with bm.  Pt bp was 130/?. Usually her bp is not high. No cardiac or neurologic signs or symptoms. Hihger today but came down when I rechecked.    Review of Systems  Constitutional: Negative for chills, diaphoresis and fatigue.  Respiratory: Negative for cough, chest tightness, shortness of breath and wheezing.   Cardiovascular: Negative for chest pain and palpitations.  Gastrointestinal: Positive for abdominal pain and constipation. Negative for diarrhea, nausea and vomiting.  Genitourinary: Positive for urgency. Negative for dysuria, enuresis, frequency, pelvic pain, vaginal bleeding and vaginal pain.  Musculoskeletal: Negative for back pain.  Neurological: Negative for dizziness, speech difficulty, weakness and headaches.  Hematological: Negative for adenopathy. Does not bruise/bleed easily.  Psychiatric/Behavioral: Negative for agitation and confusion.    Past Medical History:  Diagnosis Date  . Allergy   . Arthritis   . Cancer [facility])    breast cancer   . Colon polyps   . Diverticulitis   . Diverticulosis 01/04/2014  . Glaucoma   . Hernia, inguinal, right   . History of chicken pox    childhood  . History of hiatal hernia   . HTN (hypertension)   . Hyperglycemia 09/05/2013  . Lipoma of arm 12/09/2014   right  . Nocturia 02/08/2013  . Osteopenia 12/09/2014  . Personal history of colonic polyps 09/05/2013  .  Pre-diabetes   . Preventative health care 08/14/2016  . Unspecified constipation 02/06/2013  . Urinary incontinence   . Viral infection characterized by skin and mucous membrane lesions 05/11/2013  . Wears glasses      Social History   Socioeconomic History  . Marital status: Married    Spouse name: Not on file  . Number of children: 4  . Years of education: Not on file  . Highest education level: Not on file  Occupational History  . Not on file  Social Needs  . Financial resource strain: Not on file  . Food insecurity:    Worry: Not on file    Inability: Not on file  . Transportation needs:    Medical: Not on file    Non-medical: Not on file  Tobacco Use  . Smoking status: Never Smoker  . Smokeless tobacco: Never Used  Substance and Sexual Activity  . Alcohol use: No  . Drug use: No  . Sexual activity: Not Currently  Lifestyle  . Physical activity:    Days per week: Not on file    Minutes per session: Not on file  . Stress: Not on file  Relationships  . Social connections:    Talks on phone: Not on file    Gets together: Not on file    Attends religious service: Not on file    Active member of club or organization: Not on file    Attends meetings of clubs or organizations: Not on  file    Relationship status: Not on file  . Intimate partner violence:    Fear of current or ex partner: Not on file    Emotionally abused: Not on file    Physically abused: Not on file    Forced sexual activity: Not on file  Other Topics Concern  . Not on file  Social History Narrative  . Not on file    Past Surgical History:  Procedure Laterality Date  . ABDOMINAL HYSTERECTOMY    . APPENDECTOMY    . BLADDER SURGERY  2003   bladder tact  . BREAST LUMPECTOMY WITH RADIOACTIVE SEED LOCALIZATION Right 04/12/2018   Procedure: BREAST LUMPECTOMY WITH RADIOACTIVE SEED LOCALIZATION;  Surgeon: [name], MD;  Location: [place];  Service: General;  Laterality: Right;  . CATARACT  EXTRACTION, BILATERAL    . COLONOSCOPY W/ BIOPSIES AND POLYPECTOMY    . EYE SURGERY     multiple bilateral  . INGUINAL HERNIA REPAIR Right 04/12/2018   Procedure: OPEN RIGHT INGUINAL HERNIA REPAIR WITH MESH;  Surgeon: [name], MD;  Location: [place];  Service: General;  Laterality: Right;  . RECTOCELE REPAIR Bilateral     Family History  Problem Relation Age of Onset  . Colon cancer Mother   . Hypertension Mother   . Heart disease Father   . Meniere's disease Father   . Heart disease Sister   . Arthritis Son        back  . Glaucoma Son   . Hypertension Son   . Cataracts Son   . Colon cancer Sister   . Alzheimer's disease Sister   . Glaucoma Sister   . Glaucoma Sister   . Hyperlipidemia Sister   . Hypertension Sister   . Cancer Sister        thyroid  . Diabetes Sister   . Glaucoma Sister     Allergies  Allergen Reactions  . Amlodipine Other (See Comments)    Mental status chnages ie confusion  . Doxycycline Rash    Current Outpatient Medications on File Prior to Visit  Medication Sig Dispense Refill  . Artificial Tear Solution (GENTEAL TEARS OP) Place 1 drop into both eyes 2 (two) times daily.    Marland Kitchen aspirin EC 81 MG tablet Take 81 mg by mouth daily.    . cholecalciferol (VITAMIN D) 1000 units tablet Take 1 tablet (1,000 Units total) by mouth daily.    . Fiber POWD Take 1 Scoop by mouth daily.    . Multiple Vitamins-Minerals (ONE-A-DAY WOMENS 50 PLUS PO) Take 1 each by mouth daily.    . polyethylene glycol (MIRALAX / GLYCOLAX) packet Take 17 g by mouth 2 (two) times daily.    . potassium chloride SA (K-DUR,KLOR-CON) 20 MEQ tablet Take 1 tablet (20 mEq total) by mouth daily. 90 tablet 1  . Probiotic Product (PROBIOTIC DAILY PO) Take 1 capsule by mouth daily.     Marland Kitchen triamterene-hydrochlorothiazide (MAXZIDE-25) 37.5-25 MG tablet Take 0.5 tablets by mouth daily. 45 tablet 3   No current facility-administered medications on file prior to visit.     BP (!) 137/109    Pulse 67   Temp 97.8 F (36.6 C) (Oral)   Resp 16   Ht 4' 11.5" (1.511 m)   Wt 121 lb (54.9 kg)   SpO2 100%   BMI 24.03 kg/m       Objective:   Physical Exam  General Mental Status- Alert. General Appearance- Not in acute distress.   Skin General:  Color- Normal Color. Moisture- Normal Moisture.  Neck Carotid Arteries- Normal color. Moisture- Normal Moisture. No carotid bruits. No JVD.  Chest and Lung Exam Auscultation: Breath Sounds:-Normal.  Cardiovascular Auscultation:Rythm- Regular. Murmurs & Other Heart Sounds:Auscultation of the heart reveals- No Murmurs.  Abdomen Inspection:-Inspeection Normal. Palpation/Percussion:Note:No mass. Palpation and Percussion of the abdomen reveal- Non Tender, Non Distended + BS, no rebound or guarding. No obvious pain on palpation of rt lower quadarnt.   Neurologic Cranial Nerve exam:- CN III-XII intact(No nystagmus), symmetric smile. Strength:- 5/5 equal and symmetric strength both upper and lower extremities.      Assessment & Plan:  [phone]  You do have some history of chronic intermittent constipation that you describe starting after your hernia surgery.  Recent constipation despite 3 doses of MiraLAX.  I want to get abdomen x-ray today to assess level of constipation/stools that could be present.  Will advise on treatment regimen after review of x-ray.  With recent abdomen discomfort if pain were to worsen causing a severe type pain then recommend ED evaluation.  However you do describe pain along the lines consistent with constipation.  For your described urinary urgency, will get a urinalysis and a urine culture.  You describe some intermittent pain in the right lower quadrant region close to prior hernia surgical site.  We need to follow this closely and if you do have a good bowel movement want to know if that resolves pain in that region.  If not then we need to consider referring you back to your surgeon or  possibly doing further imaging studies.  BP reading borderline high top number systolic. Check you bp daily over next 3 days and update [place] on readings. If over 140/90 then may need to make regimen adjustment  Follow-up in 7 to 10 days or as needed.  40 minutes spent with pt. 50% of time explaining dx, tx and work up needed. Answered all questions.  Mackie Pai, PA-C

## 2018-10-27 NOTE — Telephone Encounter (Signed)
[name],  I sent in Miller to pt phramacy. Will you call her and notify discussed with Dr .Charlett Blake. hopefully it will be covered.  Thanks, Percell Miller

## 2018-10-27 NOTE — Patient Instructions (Addendum)
You do have some history of chronic intermittent constipation that you describe starting after your hernia surgery.  Recent constipation despite 3 doses of MiraLAX.  I want to get abdomen x-ray today to assess level of constipation/stools that could be present.  Will advise on treatment regimen after review of x-ray.  With recent abdomen discomfort if pain were to worsen causing a severe type pain then recommend ED evaluation.  However you do describe pain along the lines consistent with constipation.  For your described urinary urgency, will get a urinalysis and a urine culture.  You describe some intermittent pain in the right lower quadrant region close to prior hernia surgical site.  We need to follow this closely and if you do have a good bowel movement want to know if that resolves pain in that region.  If not then we need to consider referring you back to your surgeon or possibly doing further imaging studies.  BP reading borderline high top number systolic. Check you bp daily over next 3 days and update [place] on readings. If over 140/90 then may need to make regimen adjustment  Follow-up in 7 to 10 days or as needed.

## 2018-10-28 LAB — URINE CULTURE
MICRO NUMBER:: 95085
RESULT: NO GROWTH
SPECIMEN QUALITY:: ADEQUATE

## 2018-10-28 NOTE — Telephone Encounter (Signed)
Called patient left voicemail for patient to call the office back.]\  Nurse triage may handle  

## 2018-10-31 ENCOUNTER — Ambulatory Visit: Payer: Self-pay | Admitting: *Deleted

## 2018-10-31 DIAGNOSIS — E875 Hyperkalemia: Secondary | ICD-10-CM

## 2018-10-31 NOTE — Telephone Encounter (Signed)
Pt questioning if she should be taking K+ supplements. States at last appt was told her potassium was high.  Note from 10/27/2018 from E. Saguier states pt should stop any potassium supplements. Pt states she has been taking her potassium as ordered; was not aware to hold med..  Note reads to recheck labs in one week from 10/27/2018.  Please advise since pt has continued potassium; do you want pt to schedule a week from today for redraw?  CB # [id number]  Reason for Disposition . [1] Follow-up call from patient regarding patient's clinical status AND [2] information NON-URGENT  Answer Assessment - Initial Assessment Questions 1. REASON FOR CALL or QUESTION: "What is your reason for calling today?" or "How can I best help you?" or "What question do you have that I can help answer?"     Medication, lab F/U question 2. CALLER: Document the source of call. (e.g., laboratory, patient).     patient  Protocols used: PCP CALL - NO TRIAGE-A-AH

## 2018-10-31 NOTE — Telephone Encounter (Signed)
Should patient continue taking K+ supplements   Please advise

## 2018-10-31 NOTE — Telephone Encounter (Signed)
She would stop the potassium and recheck cmp in one week

## 2018-11-02 NOTE — Telephone Encounter (Signed)
Spoke w/ Pt- informed of recommendations. Lab appt scheduled 11/09/2018.

## 2018-11-02 NOTE — Addendum Note (Signed)
Addended byDamita Dunnings D on: 11/02/2018 10:43 AM   Modules accepted: Orders

## 2018-11-08 ENCOUNTER — Telehealth: Payer: Self-pay

## 2018-11-08 NOTE — Telephone Encounter (Signed)
Spoke with patient to remind of SCP visit with NP on 11/14/18 at 10 am.  Patient said she will come to appointment.

## 2018-11-09 ENCOUNTER — Other Ambulatory Visit (INDEPENDENT_AMBULATORY_CARE_PROVIDER_SITE_OTHER): Payer: Medicare HMO

## 2018-11-09 DIAGNOSIS — E875 Hyperkalemia: Secondary | ICD-10-CM

## 2018-11-09 LAB — COMPREHENSIVE METABOLIC PANEL
ALT: 17 U/L (ref 0–35)
AST: 16 U/L (ref 0–37)
Albumin: 4.1 g/dL (ref 3.5–5.2)
Alkaline Phosphatase: 50 U/L (ref 39–117)
BUN: 20 mg/dL (ref 6–23)
CHLORIDE: 99 meq/L (ref 96–112)
CO2: 30 mEq/L (ref 19–32)
Calcium: 9.8 mg/dL (ref 8.4–10.5)
Creatinine, Ser: 0.92 mg/dL (ref 0.40–1.20)
GFR: 57.43 mL/min — ABNORMAL LOW (ref >60.00)
Glucose, Bld: 91 mg/dL (ref 70–99)
Potassium: 4.9 mEq/L (ref 3.5–5.1)
Sodium: 136 mEq/L (ref 135–145)
Total Bilirubin: 0.5 mg/dL (ref 0.2–1.2)
Total Protein: 6.1 g/dL (ref 6.0–8.3)

## 2018-11-14 ENCOUNTER — Telehealth: Payer: Self-pay | Admitting: Adult Health

## 2018-11-14 ENCOUNTER — Telehealth: Payer: Self-pay | Admitting: *Deleted

## 2018-11-14 ENCOUNTER — Encounter: Payer: Self-pay | Admitting: Adult Health

## 2018-11-14 ENCOUNTER — Inpatient Hospital Stay: Payer: Medicare HMO | Attending: Oncology | Admitting: Adult Health

## 2018-11-14 VITALS — BP 161/76 | HR 76 | Temp 97.7°F | Resp 18 | Ht 59.5 in | Wt 115.1 lb

## 2018-11-14 DIAGNOSIS — I10 Essential (primary) hypertension: Secondary | ICD-10-CM | POA: Insufficient documentation

## 2018-11-14 DIAGNOSIS — Z170 Estrogen receptor positive status [ER+]: Secondary | ICD-10-CM | POA: Insufficient documentation

## 2018-11-14 DIAGNOSIS — C50211 Malignant neoplasm of upper-inner quadrant of right female breast: Secondary | ICD-10-CM | POA: Diagnosis not present

## 2018-11-14 DIAGNOSIS — Z79899 Other long term (current) drug therapy: Secondary | ICD-10-CM | POA: Diagnosis not present

## 2018-11-14 DIAGNOSIS — M8580 Other specified disorders of bone density and structure, unspecified site: Secondary | ICD-10-CM | POA: Diagnosis not present

## 2018-11-14 DIAGNOSIS — Z7982 Long term (current) use of aspirin: Secondary | ICD-10-CM | POA: Insufficient documentation

## 2018-11-14 DIAGNOSIS — Z79811 Long term (current) use of aromatase inhibitors: Secondary | ICD-10-CM | POA: Insufficient documentation

## 2018-11-14 NOTE — Telephone Encounter (Signed)
No los °

## 2018-11-14 NOTE — Progress Notes (Signed)
CLINIC:  Survivorship   REASON FOR VISIT:  Routine follow-up post-treatment for a recent history of breast cancer.  BRIEF ONCOLOGIC HISTORY:    Malignant neoplasm of upper-inner quadrant of right breast in female, estrogen receptor positive ([place])   03/18/2018 Initial Diagnosis    status post right breast upper inner quadrant biopsy for a clinical T1b No, stage IA base of lobular carcinoma, grade 2, estrogen and progesterone receptor positive, HER-2 not amplified, with an MIB-1 of 2%.    04/12/2018 Surgery    status post right lumpectomy with no sentinel lymph node sampling for a pT1c Nx, stage IA invasive lobular carcinoma, with negative margins    05/2018 -  Anti-estrogen oral therapy    anastrozole prescribed 05/20/2018 but never started by the patient             (a) bone density 03/03/2018 finds a T score of -1.8 (osteopenia     INTERVAL HISTORY:  Amber Morgan presents to the [facility] today for our initial meeting to review her survivorship care plan detailing her treatment course for breast cancer, as well as monitoring long-term side effects of that treatment, education regarding health maintenance, screening, and overall wellness and health promotion.     Overall, Amber Morgan reports feeling quite well since her surgery.  She denies any current issues, other than an occasional breast pain.  She also notes that she will feel her hiatal hernia from time to time.      REVIEW OF SYSTEMS:  Review of Systems  Constitutional: Negative for appetite change, chills, fatigue, fever and unexpected weight change.  HENT:   Negative for hearing loss, lump/mass, sore throat and trouble swallowing.   Eyes: Negative for eye problems and icterus.  Respiratory: Negative for chest tightness, cough and shortness of breath.   Cardiovascular: Negative for chest pain, leg swelling and palpitations.  Gastrointestinal: Negative for abdominal distention, abdominal pain, constipation,  diarrhea, nausea and vomiting.  Endocrine: Negative for hot flashes.  Musculoskeletal: Negative for arthralgias.  Skin: Negative for itching and rash.  Neurological: Negative for dizziness, extremity weakness, headaches and numbness.  Hematological: Negative for adenopathy. Does not bruise/bleed easily.  Psychiatric/Behavioral: Negative for depression. The patient is not nervous/anxious.    Breast: Denies any new nodularity, masses, tenderness, nipple changes, or nipple discharge.      ONCOLOGY TREATMENT TEAM:  1. Surgeon:  Dr. Barry Dienes at [facility] 2. Medical Oncologist: Dr. Jana Hakim  3. Radiation Oncologist: Dr. Isidore Moos    PAST MEDICAL/SURGICAL HISTORY:  Past Medical History:  Diagnosis Date  . Allergy   . Arthritis   . Cancer [facility])    breast cancer   . Colon polyps   . Diverticulitis   . Diverticulosis 01/04/2014  . Glaucoma   . Hernia, inguinal, right   . History of chicken pox    childhood  . History of hiatal hernia   . HTN (hypertension)   . Hyperglycemia 09/05/2013  . Lipoma of arm 12/09/2014   right  . Nocturia 02/08/2013  . Osteopenia 12/09/2014  . Personal history of colonic polyps 09/05/2013  . Pre-diabetes   . Preventative health care 08/14/2016  . Unspecified constipation 02/06/2013  . Urinary incontinence   . Viral infection characterized by skin and mucous membrane lesions 05/11/2013  . Wears glasses    Past Surgical History:  Procedure Laterality Date  . ABDOMINAL HYSTERECTOMY    . APPENDECTOMY    . BLADDER SURGERY  2003   bladder tact  .  BREAST LUMPECTOMY WITH RADIOACTIVE SEED LOCALIZATION Right 04/12/2018   Procedure: BREAST LUMPECTOMY WITH RADIOACTIVE SEED LOCALIZATION;  Surgeon: [name], MD;  Location: [place];  Service: General;  Laterality: Right;  . CATARACT EXTRACTION, BILATERAL    . COLONOSCOPY W/ BIOPSIES AND POLYPECTOMY    . EYE SURGERY     multiple bilateral  . INGUINAL HERNIA REPAIR Right 04/12/2018   Procedure: OPEN RIGHT  INGUINAL HERNIA REPAIR WITH MESH;  Surgeon: [name], MD;  Location: [place];  Service: General;  Laterality: Right;  . RECTOCELE REPAIR Bilateral      ALLERGIES:  Allergies  Allergen Reactions  . Amlodipine Other (See Comments)    Mental status chnages ie confusion  . Doxycycline Rash     CURRENT MEDICATIONS:  Outpatient Encounter Medications as of 11/14/2018  Medication Sig  . Artificial Tear Solution (GENTEAL TEARS OP) Place 1 drop into both eyes 2 (two) times daily.  Marland Kitchen aspirin EC 81 MG tablet Take 81 mg by mouth daily.  . cholecalciferol (VITAMIN D) 1000 units tablet Take 1 tablet (1,000 Units total) by mouth daily.  . Fiber POWD Take 1 Scoop by mouth daily.  Marland Kitchen linaclotide (LINZESS) 145 MCG CAPS capsule Take 1 capsule (145 mcg total) by mouth daily before breakfast.  . Multiple Vitamins-Minerals (ONE-A-DAY WOMENS 50 PLUS PO) Take 1 each by mouth daily.  . polyethylene glycol (MIRALAX / GLYCOLAX) packet Take 17 g by mouth 2 (two) times daily.  . Probiotic Product (PROBIOTIC DAILY PO) Take 1 capsule by mouth daily.   . sodium chloride (MURO 128) 2 % ophthalmic solution Place 1 drop into both eyes.  Marland Kitchen triamterene-hydrochlorothiazide (MAXZIDE-25) 37.5-25 MG tablet Take 0.5 tablets by mouth daily.  . [DISCONTINUED] potassium chloride SA (K-DUR,KLOR-CON) 20 MEQ tablet Take 1 tablet (20 mEq total) by mouth daily. (Patient not taking: Reported on 11/14/2018)   No facility-administered encounter medications on file as of 11/14/2018.      ONCOLOGIC FAMILY HISTORY:  Family History  Problem Relation Age of Onset  . Colon cancer Mother   . Hypertension Mother   . Heart disease Father   . Meniere's disease Father   . Heart disease Sister   . Arthritis Son        back  . Glaucoma Son   . Hypertension Son   . Cataracts Son   . Colon cancer Sister   . Alzheimer's disease Sister   . Glaucoma Sister   . Glaucoma Sister   . Hyperlipidemia Sister   . Hypertension Sister   . Cancer  Sister        thyroid  . Diabetes Sister   . Glaucoma Sister      GENETIC COUNSELING/TESTING: Not at this time  SOCIAL HISTORY:  Social History   Socioeconomic History  . Marital status: Married    Spouse name: Not on file  . Number of children: 4  . Years of education: Not on file  . Highest education level: Not on file  Occupational History  . Not on file  Social Needs  . Financial resource strain: Not on file  . Food insecurity:    Worry: Not on file    Inability: Not on file  . Transportation needs:    Medical: Not on file    Non-medical: Not on file  Tobacco Use  . Smoking status: Never Smoker  . Smokeless tobacco: Never Used  Substance and Sexual Activity  . Alcohol use: No  . Drug use: No  . Sexual  activity: Not Currently  Lifestyle  . Physical activity:    Days per week: Not on file    Minutes per session: Not on file  . Stress: Not on file  Relationships  . Social connections:    Talks on phone: Not on file    Gets together: Not on file    Attends religious service: Not on file    Active member of club or organization: Not on file    Attends meetings of clubs or organizations: Not on file    Relationship status: Not on file  . Intimate partner violence:    Fear of current or ex partner: Not on file    Emotionally abused: Not on file    Physically abused: Not on file    Forced sexual activity: Not on file  Other Topics Concern  . Not on file  Social History Narrative  . Not on file      PHYSICAL EXAMINATION:  Vital Signs:   Vitals:   11/14/18 1001  BP: (!) 161/76  Pulse: 76  Resp: 18  Temp: 97.7 F (36.5 C)  SpO2: 99%   Filed Weights   11/14/18 1001  Weight: 115 lb 1.6 oz (52.2 kg)   General: Well-nourished, well-appearing female in no acute distress.  She is unaccompanied today.   HEENT: Head is normocephalic.  Pupils equal and reactive to light. Conjunctivae clear without exudate.  Sclerae anicteric. Oral mucosa is pink, moist.   Oropharynx is pink without lesions or erythema.  Lymph: No cervical, supraclavicular, or infraclavicular lymphadenopathy noted on palpation.  Cardiovascular: Regular rate and rhythm.Marland Kitchen Respiratory: Clear to auscultation bilaterally. Chest expansion symmetric; breathing non-labored.  Breast: s/p right lumpectomy, no sign of local recurrence, left breast benign GI: Abdomen soft and round; non-tender, non-distended. Bowel sounds normoactive.  GU: Deferred.  Neuro: No focal deficits. Steady gait.  Psych: Mood and affect normal and appropriate for situation.  Extremities: No edema. MSK: No focal spinal tenderness to palpation.  Full range of motion in bilateral upper extremities Skin: Warm and dry.  LABORATORY DATA:  None for this visit.  DIAGNOSTIC IMAGING:  None for this visit.      ASSESSMENT AND PLAN:  Ms.. Amber Morgan is a pleasant 85 y.o. female with Stage IA right breast invasive lobular carcinoma, ER+/PR+/HER2-, diagnosed in 03/2018, treated with lumpectomy and declined anti estrogen therapy.  She presents to the Survivorship Clinic for our initial meeting and routine follow-up post-completion of treatment for breast cancer.    1. Stage IA right breast cancer:  Ms. Sandall is continuing to recover from definitive treatment for breast cancer. She will follow-up with her medical oncologist, Dr. Jana Hakim in 04/2019 with history and physical exam per surveillance protocol.  We again reviewed indications, risks, and benefits of her taking Anastrozole.  She let me know that she will continue to think about it. Today, a comprehensive survivorship care plan and treatment summary was reviewed with the patient today detailing her breast cancer diagnosis, treatment course, potential late/long-term effects of treatment, appropriate follow-up care with recommendations for the future, and patient education resources.  A copy of this summary, along with a letter will be sent to the patient's primary care  provider via mail/fax/In Basket message after today's visit.    2. Bone health:  Given Ms. Tallie age/history of breast cancer, she is at risk for bone demineralization.  Her last DEXA scan was 02/2018 and showed osteopenia with a T score of -1.8 in the right femur.  She  was given education on specific activities to promote bone health.  3. Cancer screening:  Due to Ms. Krasner history and her age, she should receive screening for skin cancer.  The information and recommendations are listed on the patient's comprehensive care plan/treatment summary and were reviewed in detail with the patient.    4. Health maintenance and wellness promotion: Ms. Aloi was encouraged to consume 5-7 servings of fruits and vegetables per day. We reviewed the "Nutrition Rainbow" handout, as well as the handout "Take Control of Your Health and Reduce Your Cancer Risk" from the [place].  She was also encouraged to engage in moderate to vigorous exercise for 30 minutes per day most days of the week. We discussed the [employer] fitness program, which is designed for cancer survivors to help them become more physically fit after cancer treatments.  She was instructed to limit her alcohol consumption and continue to abstain from tobacco use.     5. Support services/counseling: It is not uncommon for this period of the patient's cancer care trajectory to be one of many emotions and stressors.  We discussed an opportunity for her to participate in the next session of [facility] ("Finding Your New Normal") support group series designed for patients after they have completed treatment.   Ms. Bach was encouraged to take advantage of our many other support services programs, support groups, and/or counseling in coping with her new life as a cancer survivor after completing anti-cancer treatment.  She was offered support today through active listening and expressive supportive counseling.  She was given information  regarding our available services and encouraged to contact me with any questions or for help enrolling in any of our support group/programs.    Dispo:   -Return to cancer center 04/2019 for f/u with Dr. Jana Hakim  -Mammogram due in 02/2019 -She is welcome to return back to the Survivorship Clinic at any time; no additional follow-up needed at this time.  -Consider referral back to survivorship as a long-term survivor for continued surveillance  A total of (30) minutes of face-to-face time was spent with this patient with greater than 50% of that time in counseling and care-coordination.   Gardenia Phlegm, NP Survivorship Program [place] [phone]   Note: PRIMARY CARE PROVIDER Mosie Lukes, [place] [phone]

## 2018-11-14 NOTE — Telephone Encounter (Signed)
Patient called for results from 11/09/18.  Advised her of results.  Looks like letter was mailed on 11/10/18.

## 2018-12-05 ENCOUNTER — Ambulatory Visit (INDEPENDENT_AMBULATORY_CARE_PROVIDER_SITE_OTHER): Payer: Medicare HMO | Admitting: Family Medicine

## 2018-12-05 DIAGNOSIS — K5900 Constipation, unspecified: Secondary | ICD-10-CM

## 2018-12-05 DIAGNOSIS — I10 Essential (primary) hypertension: Secondary | ICD-10-CM | POA: Diagnosis not present

## 2018-12-05 DIAGNOSIS — E785 Hyperlipidemia, unspecified: Secondary | ICD-10-CM

## 2018-12-05 DIAGNOSIS — E876 Hypokalemia: Secondary | ICD-10-CM

## 2018-12-05 DIAGNOSIS — M8580 Other specified disorders of bone density and structure, unspecified site: Secondary | ICD-10-CM | POA: Diagnosis not present

## 2018-12-05 DIAGNOSIS — R350 Frequency of micturition: Secondary | ICD-10-CM

## 2018-12-05 DIAGNOSIS — R739 Hyperglycemia, unspecified: Secondary | ICD-10-CM | POA: Diagnosis not present

## 2018-12-05 DIAGNOSIS — R000 Tachycardia, unspecified: Secondary | ICD-10-CM

## 2018-12-05 DIAGNOSIS — F4321 Adjustment disorder with depressed mood: Secondary | ICD-10-CM | POA: Diagnosis not present

## 2018-12-05 LAB — COMPREHENSIVE METABOLIC PANEL
ALT: 20 U/L (ref 0–35)
AST: 20 U/L (ref 0–37)
Albumin: 4.3 g/dL (ref 3.5–5.2)
Alkaline Phosphatase: 50 U/L (ref 39–117)
BUN: 26 mg/dL — ABNORMAL HIGH (ref 6–23)
CO2: 31 mEq/L (ref 19–32)
Calcium: 10.6 mg/dL — ABNORMAL HIGH (ref 8.4–10.5)
Chloride: 100 mEq/L (ref 96–112)
Creatinine, Ser: 0.9 mg/dL (ref 0.40–1.20)
GFR: 58.9 mL/min — ABNORMAL LOW (ref >60.00)
Glucose, Bld: 91 mg/dL (ref 70–99)
Potassium: 5 mEq/L (ref 3.5–5.1)
Sodium: 140 mEq/L (ref 135–145)
Total Bilirubin: 0.4 mg/dL (ref 0.2–1.2)
Total Protein: 6.6 g/dL (ref 6.0–8.3)

## 2018-12-05 LAB — CBC
HCT: 38.8 % (ref 36.0–46.0)
Hemoglobin: 13 g/dL (ref 12.0–15.0)
MCHC: 33.6 g/dL (ref 30.0–36.0)
MCV: 95.3 fl (ref 78.0–100.0)
PLATELETS: 264 10*3/uL (ref 150.0–400.0)
RBC: 4.07 Mil/uL (ref 3.87–5.11)
RDW: 13.5 % (ref 11.5–15.5)
WBC: 9.9 10*3/uL (ref 4.0–10.5)

## 2018-12-05 LAB — LIPID PANEL
CHOL/HDL RATIO: 2
Cholesterol: 160 mg/dL (ref 0–200)
HDL: 67.9 mg/dL (ref >39.00)
LDL Cholesterol: 71 mg/dL (ref 0–99)
NonHDL: 91.64
Triglycerides: 102 mg/dL (ref 0.0–149.0)
VLDL: 20.4 mg/dL (ref 0.0–40.0)

## 2018-12-05 LAB — URINALYSIS
Bilirubin Urine: NEGATIVE
Hgb urine dipstick: NEGATIVE
KETONES UR: NEGATIVE
Leukocytes,Ua: NEGATIVE
Nitrite: NEGATIVE
Specific Gravity, Urine: 1.015 (ref 1.000–1.030)
TOTAL PROTEIN, URINE-UPE24: NEGATIVE
Urine Glucose: NEGATIVE
Urobilinogen, UA: 0.2 (ref 0.0–1.0)
pH: 7.5 (ref 5.0–8.0)

## 2018-12-05 LAB — TSH: TSH: 3.37 u[IU]/mL (ref 0.35–4.50)

## 2018-12-05 LAB — HEMOGLOBIN A1C: Hgb A1c MFr Bld: 6.1 % (ref 4.6–6.5)

## 2018-12-05 MED ORDER — LOSARTAN POTASSIUM 25 MG PO TABS: 25.0000 mg | ORAL_TABLET | Freq: Every day | ORAL | 5 refills | Status: DC

## 2018-12-05 NOTE — Assessment & Plan Note (Signed)
RRR today 

## 2018-12-05 NOTE — Assessment & Plan Note (Signed)
Doing well with family support since death of her husband. Is moving to [place] next month in [place]

## 2018-12-05 NOTE — Assessment & Plan Note (Signed)
Encouraged heart healthy diet, increase exercise, avoid trans fats, consider a krill oil cap daily 

## 2018-12-05 NOTE — Assessment & Plan Note (Signed)
She requests a switch off of the Maxzide due to urinary frequency and hyperkalemia. Will try Losartan 25 mg and check bp and cmp in 1 month

## 2018-12-05 NOTE — Assessment & Plan Note (Signed)
Has been started on Linzess 145 and had 3 BMs this am but normally only 2. Stop Miralax and continue Fiber. hydrate

## 2018-12-05 NOTE — Assessment & Plan Note (Signed)
Stop Maxzide and monitor

## 2018-12-05 NOTE — Progress Notes (Signed)
Subjective:    Patient ID: Amber Morgan, female    DOB: 23-Apr-1927, 85 y.o.   MRN: [id number]  No chief complaint on file.   HPI Patient is in today for follow-up.  Overall she is doing well.  She is managing the loss of her husband with good family support and is preparing to move into assisted living next month.  No recent febrile illness although she does note some recent mild trouble with constipation.  She was seen and started on Linzess.  Her bowels are moving 2-3 times a day she is cut back on MiraLAX to once a day and is considering stopping it.  She continues her fiber.  No other acute concerns noted at today's visit. Denies CP/palp/SOB/HA/congestion/fevers or GU c/o. Taking meds as prescribed  Past Medical History:  Diagnosis Date  . Allergy   . Arthritis   . Cancer [facility])    breast cancer   . Colon polyps   . Diverticulitis   . Diverticulosis 01/04/2014  . Glaucoma   . Hernia, inguinal, right   . History of chicken pox    childhood  . History of hiatal hernia   . HTN (hypertension)   . Hyperglycemia 09/05/2013  . Lipoma of arm 12/09/2014   right  . Nocturia 02/08/2013  . Osteopenia 12/09/2014  . Personal history of colonic polyps 09/05/2013  . Pre-diabetes   . Preventative health care 08/14/2016  . Unspecified constipation 02/06/2013  . Urinary incontinence   . Viral infection characterized by skin and mucous membrane lesions 05/11/2013  . Wears glasses     Past Surgical History:  Procedure Laterality Date  . ABDOMINAL HYSTERECTOMY    . APPENDECTOMY    . BLADDER SURGERY  2003   bladder tact  . BREAST LUMPECTOMY WITH RADIOACTIVE SEED LOCALIZATION Right 04/12/2018   Procedure: BREAST LUMPECTOMY WITH RADIOACTIVE SEED LOCALIZATION;  Surgeon: [name], MD;  Location: [facility];  Service: General;  Laterality: Right;  . CATARACT EXTRACTION, BILATERAL    . COLONOSCOPY W/ BIOPSIES AND POLYPECTOMY    . EYE SURGERY     multiple bilateral  . INGUINAL HERNIA REPAIR Right  04/12/2018   Procedure: OPEN RIGHT INGUINAL HERNIA REPAIR WITH MESH;  Surgeon: [name], MD;  Location: [facility];  Service: General;  Laterality: Right;  . RECTOCELE REPAIR Bilateral     Family History  Problem Relation Age of Onset  . Colon cancer Mother   . Hypertension Mother   . Heart disease Father   . Meniere's disease Father   . Heart disease Sister   . Arthritis Son        back  . Glaucoma Son   . Hypertension Son   . Cataracts Son   . Colon cancer Sister   . Alzheimer's disease Sister   . Glaucoma Sister   . Glaucoma Sister   . Hyperlipidemia Sister   . Hypertension Sister   . Cancer Sister        thyroid  . Diabetes Sister   . Glaucoma Sister     Social History   Socioeconomic History  . Marital status: Married    Spouse name: Not on file  . Number of children: 4  . Years of education: Not on file  . Highest education level: Not on file  Occupational History  . Not on file  Social Needs  . Financial resource strain: Not on file  . Food insecurity:    Worry: Not on file  Inability: Not on file  . Transportation needs:    Medical: Not on file    Non-medical: Not on file  Tobacco Use  . Smoking status: Never Smoker  . Smokeless tobacco: Never Used  Substance and Sexual Activity  . Alcohol use: No  . Drug use: No  . Sexual activity: Not Currently  Lifestyle  . Physical activity:    Days per week: Not on file    Minutes per session: Not on file  . Stress: Not on file  Relationships  . Social connections:    Talks on phone: Not on file    Gets together: Not on file    Attends religious service: Not on file    Active member of club or organization: Not on file    Attends meetings of clubs or organizations: Not on file    Relationship status: Not on file  . Intimate partner violence:    Fear of current or ex partner: Not on file    Emotionally abused: Not on file    Physically abused: Not on file    Forced sexual activity: Not on file  Other  Topics Concern  . Not on file  Social History Narrative  . Not on file    Outpatient Medications Prior to Visit  Medication Sig Dispense Refill  . Artificial Tear Solution (GENTEAL TEARS OP) Place 1 drop into both eyes 2 (two) times daily.    . cholecalciferol (VITAMIN D) 1000 units tablet Take 1 tablet (1,000 Units total) by mouth daily.    . Fiber POWD Take 1 Scoop by mouth daily.    Marland Kitchen linaclotide (LINZESS) 145 MCG CAPS capsule Take 1 capsule (145 mcg total) by mouth daily before breakfast. 30 capsule 2  . Probiotic Product (PROBIOTIC DAILY PO) Take 1 capsule by mouth daily.     . sodium chloride (MURO 128) 2 % ophthalmic solution Place 1 drop into both eyes.    Marland Kitchen aspirin EC 81 MG tablet Take 81 mg by mouth daily.    . Multiple Vitamins-Minerals (ONE-A-DAY WOMENS 50 PLUS PO) Take 1 each by mouth daily.    . polyethylene glycol (MIRALAX / GLYCOLAX) packet Take 17 g by mouth 2 (two) times daily.    Marland Kitchen triamterene-hydrochlorothiazide (MAXZIDE-25) 37.5-25 MG tablet Take 0.5 tablets by mouth daily. 45 tablet 3   No facility-administered medications prior to visit.     Allergies  Allergen Reactions  . Amlodipine Other (See Comments)    Mental status chnages ie confusion  . Doxycycline Rash    Review of Systems  Constitutional: Negative for fever and malaise/fatigue.  HENT: Negative for congestion.   Eyes: Negative for blurred vision.  Respiratory: Positive for sputum production. Negative for shortness of breath.   Cardiovascular: Negative for chest pain, palpitations and leg swelling.  Gastrointestinal: Positive for constipation. Negative for abdominal pain, blood in stool and nausea.  Genitourinary: Negative for dysuria and frequency.  Musculoskeletal: Negative for falls.  Skin: Negative for rash.  Neurological: Negative for dizziness, loss of consciousness and headaches.  Endo/Heme/Allergies: Negative for environmental allergies.  Psychiatric/Behavioral: Negative for depression.  The patient is not nervous/anxious.        Objective:    Physical Exam Vitals signs and nursing note reviewed.  Constitutional:      General: She is not in acute distress.    Appearance: She is well-developed.  HENT:     Head: Normocephalic and atraumatic.     Nose: Nose normal.  Eyes:  General:        Right eye: No discharge.        Left eye: No discharge.  Neck:     Musculoskeletal: Normal range of motion and neck supple.  Cardiovascular:     Rate and Rhythm: Normal rate and regular rhythm.     Heart sounds: No murmur.  Pulmonary:     Effort: Pulmonary effort is normal.     Breath sounds: Normal breath sounds.  Abdominal:     General: Bowel sounds are normal.     Palpations: Abdomen is soft.     Tenderness: There is no abdominal tenderness.  Skin:    General: Skin is warm and dry.  Neurological:     Mental Status: She is alert and oriented to person, place, and time.     BP (!) 140/52 (BP Location: Left Arm, Patient Position: Sitting, Cuff Size: Normal)   Pulse 74   Temp 98.3 F (36.8 C) (Oral)   Resp 18   Ht 4\' 9"  (1.448 m)   Wt 123 lb 9.6 oz (56.1 kg)   SpO2 99%   BMI 26.75 kg/m  Wt Readings from Last 3 Encounters:  12/05/18 123 lb 9.6 oz (56.1 kg)  11/14/18 115 lb 1.6 oz (52.2 kg)  10/27/18 121 lb (54.9 kg)     Lab Results  Component Value Date   WBC 6.3 10/27/2018   HGB 12.9 10/27/2018   HCT 38.7 10/27/2018   PLT 250.0 10/27/2018   GLUCOSE 91 11/09/2018   CHOL 150 08/19/2018   TRIG 72.0 08/19/2018   HDL 58.30 08/19/2018   LDLCALC 77 08/19/2018   ALT 17 11/09/2018   Morgan 16 11/09/2018   NA 136 11/09/2018   K 4.9 11/09/2018   CL 99 11/09/2018   CREATININE 0.92 11/09/2018   BUN 20 11/09/2018   CO2 30 11/09/2018   TSH 2.29 08/19/2018   HGBA1C 6.1 08/19/2018   MICROALBUR 0.8 08/06/2016    Lab Results  Component Value Date   TSH 2.29 08/19/2018   Lab Results  Component Value Date   WBC 6.3 10/27/2018   HGB 12.9 10/27/2018   HCT  38.7 10/27/2018   MCV 95.3 10/27/2018   PLT 250.0 10/27/2018   Lab Results  Component Value Date   NA 136 11/09/2018   K 4.9 11/09/2018   CO2 30 11/09/2018   GLUCOSE 91 11/09/2018   BUN 20 11/09/2018   CREATININE 0.92 11/09/2018   BILITOT 0.5 11/09/2018   ALKPHOS 50 11/09/2018   Morgan 16 11/09/2018   ALT 17 11/09/2018   PROT 6.1 11/09/2018   ALBUMIN 4.1 11/09/2018   CALCIUM 9.8 11/09/2018   ANIONGAP 7 03/30/2018   GFR 57.43 (L) 11/09/2018   Lab Results  Component Value Date   CHOL 150 08/19/2018   Lab Results  Component Value Date   HDL 58.30 08/19/2018   Lab Results  Component Value Date   LDLCALC 77 08/19/2018   Lab Results  Component Value Date   TRIG 72.0 08/19/2018   Lab Results  Component Value Date   CHOLHDL 3 08/19/2018   Lab Results  Component Value Date   HGBA1C 6.1 08/19/2018       Assessment & Plan:   Problem List Items Addressed This Visit    HTN (hypertension)    She requests a switch off of the Maxzide due to urinary frequency and hyperkalemia. Will try Losartan 25 mg and check bp and cmp in 1 month  Relevant Medications   losartan (COZAAR) 25 MG tablet   Hyperlipidemia    Encouraged heart healthy diet, increase exercise, avoid trans fats, consider a krill oil cap daily      Relevant Medications   losartan (COZAAR) 25 MG tablet   Constipation    Has been started on Linzess 145 and had 3 BMs this am but normally only 2. Stop Miralax and continue Fiber. hydrate      Hyperglycemia    hgba1c acceptable, minimize simple carbs. Increase exercise as tolerated.       Osteopenia    Encouraged to get adequate exercise, calcium and vitamin d intake      Hypokalemia    Stop Maxzide and monitor      Urination frequency    Urinalysis and culture      Tachycardia    RRR today      Grief    Doing well with family support since death of her husband. Is moving to [place] next month in [place]         I have discontinued Emnet M.  Bartha's Multiple Vitamins-Minerals (ONE-A-DAY WOMENS 50 PLUS PO), polyethylene glycol, aspirin EC, and triamterene-hydrochlorothiazide. I am also having her start on losartan. Additionally, I am having her maintain her Probiotic Product (PROBIOTIC DAILY PO), Artificial Tear Solution (GENTEAL TEARS OP), Fiber, cholecalciferol, linaclotide, and sodium chloride.  Meds ordered this encounter  Medications  . losartan (COZAAR) 25 MG tablet    Sig: Take 1 tablet (25 mg total) by mouth daily.    Dispense:  30 tablet    Refill:  5     Penni Homans, MD

## 2018-12-05 NOTE — Assessment & Plan Note (Signed)
Encouraged to get adequate exercise, calcium and vitamin d intake 

## 2018-12-05 NOTE — Assessment & Plan Note (Signed)
hgba1c acceptable, minimize simple carbs. Increase exercise as tolerated.  

## 2018-12-05 NOTE — Assessment & Plan Note (Signed)
Urinalysis and culture

## 2018-12-05 NOTE — Patient Instructions (Addendum)
Consider switching from Metamucil/Psyllium to Benefiber powder once to twice daily and stop the Miralax Constipation, Adult Constipation is when a person has fewer bowel movements in a week than normal, has difficulty having a bowel movement, or has stools that are dry, hard, or larger than normal. Constipation may be caused by an underlying condition. It may become worse with age if a person takes certain medicines and does not take in enough fluids. Follow these instructions at home: Eating and drinking   Eat foods that have a lot of fiber, such as fresh fruits and vegetables, whole grains, and beans.  Limit foods that are high in fat, low in fiber, or overly processed, such as french fries, hamburgers, cookies, candies, and soda.  Drink enough fluid to keep your urine clear or pale yellow. General instructions  Exercise regularly or as told by your health care provider.  Go to the restroom when you have the urge to go. Do not hold it in.  Take over-the-counter and prescription medicines only as told by your health care provider. These include any fiber supplements.  Practice pelvic floor retraining exercises, such as deep breathing while relaxing the lower abdomen and pelvic floor relaxation during bowel movements.  Watch your condition for any changes.  Keep all follow-up visits as told by your health care provider. This is important. Contact a health care provider if:  You have pain that gets worse.  You have a fever.  You do not have a bowel movement after 4 days.  You vomit.  You are not hungry.  You lose weight.  You are bleeding from the anus.  You have thin, pencil-like stools. Get help right away if:  You have a fever and your symptoms suddenly get worse.  You leak stool or have blood in your stool.  Your abdomen is bloated.  You have severe pain in your abdomen.  You feel dizzy or you faint. This information is not intended to replace advice given to  you by your health care provider. Make sure you discuss any questions you have with your health care provider. Document Released: 06/19/2004 Document Revised: 04/10/2016 Document Reviewed: 03/11/2016 Elsevier Interactive Patient Education  2019 [employer].

## 2018-12-06 ENCOUNTER — Other Ambulatory Visit (INDEPENDENT_AMBULATORY_CARE_PROVIDER_SITE_OTHER): Payer: Medicare HMO

## 2018-12-06 DIAGNOSIS — E876 Hypokalemia: Secondary | ICD-10-CM

## 2018-12-06 LAB — URINE CULTURE
MICRO NUMBER:: 263249
RESULT: NO GROWTH
SPECIMEN QUALITY:: ADEQUATE

## 2018-12-06 LAB — VITAMIN D 25 HYDROXY (VIT D DEFICIENCY, FRACTURES): VITD: 61.83 ng/mL (ref 30.00–100.00)

## 2018-12-14 ENCOUNTER — Telehealth: Payer: Self-pay | Admitting: Family Medicine

## 2018-12-14 NOTE — Telephone Encounter (Signed)
Pt calling again for lab results. Best number is [phone]. Pt said ok to leave a detailed msg with her results if she isn't available.

## 2018-12-14 NOTE — Telephone Encounter (Signed)
Copied from [place] [phone]. Topic: Quick Communication - See Telephone Encounter >> Dec 14, 2018  9:42 AM Bea Graff, NT wrote: CRM for notification. See Telephone encounter for: 12/14/18. Pt requesting a call with her 12/05/2018 lab results.

## 2018-12-16 NOTE — Telephone Encounter (Signed)
Result Notes for Vitamin D 25 hydroxy   Notes recorded by Loleta Chance, CMA on 12/15/2018 at 4:22 PM EDT Labs given to patient via telephone. ------  Notes recorded by Mosie Lukes, MD on 12/06/2018 at 5:32 PM EST Notify vitamin D is normal

## 2019-01-05 ENCOUNTER — Ambulatory Visit: Payer: Medicare HMO

## 2019-02-17 ENCOUNTER — Ambulatory Visit: Payer: Medicare HMO | Admitting: *Deleted

## 2019-03-06 ENCOUNTER — Ambulatory Visit
Admission: RE | Admit: 2019-03-06 | Discharge: 2019-03-06 | Disposition: A | Discharge status: Home / Self Care | Payer: Medicare HMO | Source: Ambulatory Visit | Attending: Adult Health | Admitting: Adult Health

## 2019-03-06 ENCOUNTER — Other Ambulatory Visit: Payer: Self-pay

## 2019-03-06 DIAGNOSIS — Z853 Personal history of malignant neoplasm of breast: Secondary | ICD-10-CM | POA: Diagnosis not present

## 2019-03-06 DIAGNOSIS — R922 Inconclusive mammogram: Secondary | ICD-10-CM | POA: Diagnosis not present

## 2019-03-06 DIAGNOSIS — Z170 Estrogen receptor positive status [ER+]: Secondary | ICD-10-CM

## 2019-03-06 DIAGNOSIS — C50211 Malignant neoplasm of upper-inner quadrant of right female breast: Secondary | ICD-10-CM

## 2019-03-17 ENCOUNTER — Other Ambulatory Visit: Payer: Self-pay

## 2019-03-17 ENCOUNTER — Ambulatory Visit: Payer: Medicare HMO | Admitting: Family Medicine

## 2019-03-19 ENCOUNTER — Other Ambulatory Visit: Payer: Self-pay | Admitting: Oncology

## 2019-03-27 DIAGNOSIS — Z961 Presence of intraocular lens: Secondary | ICD-10-CM | POA: Diagnosis not present

## 2019-03-27 DIAGNOSIS — H18422 Band keratopathy, left eye: Secondary | ICD-10-CM | POA: Diagnosis not present

## 2019-03-27 DIAGNOSIS — H44522 Atrophy of globe, left eye: Secondary | ICD-10-CM | POA: Diagnosis not present

## 2019-03-27 DIAGNOSIS — H401113 Primary open-angle glaucoma, right eye, severe stage: Secondary | ICD-10-CM | POA: Diagnosis not present

## 2019-04-06 ENCOUNTER — Telehealth: Payer: Self-pay | Admitting: Oncology

## 2019-04-06 NOTE — Telephone Encounter (Signed)
GM PAL 7/21 f/u moved to 7/31 as telephone visit. Confirmed with patient. Patient cannot do visit via webex - no capability.

## 2019-04-20 NOTE — Progress Notes (Signed)
Virtual Visit via Video Note  I connected with patient on 04/21/19 at 11:00 AM EDT by audio enabled telemedicine application and verified that I am speaking with the correct person using two identifiers.   THIS ENCOUNTER IS A VIRTUAL VISIT DUE TO COVID-19 - PATIENT WAS NOT SEEN IN THE OFFICE. PATIENT HAS CONSENTED TO VIRTUAL VISIT / TELEMEDICINE VISIT   Location of patient: home  Location of provider: office  I discussed the limitations of evaluation and management by telemedicine and the availability of in person appointments. The patient expressed understanding and agreed to proceed.   Subjective:   Amber Morgan is a 85 y.o. female who presents for Medicare Annual (Subsequent) preventive examination.  Review of Systems: No ROS.  Medicare Wellness Virtual Visit.  Visual/audio telehealth visit, UTA vital signs.   See social history for additional risk factors. Cardiac Risk Factors include: advanced age (>66men, >27 women);dyslipidemia;hypertension Sleep patterns: no issues Home Safety/Smoke Alarms: Feels safe in home. Smoke alarms in place.  Lives alone. No stairs.   Female:         Mammo-  03/06/19     Dexa scan-   03/03/18         Objective:     Vitals: BP 126/62 Comment: pt reported  There is no height or weight on file to calculate BMI.  Advanced Directives 04/21/2019 04/04/2018 02/15/2018 02/22/2017 08/14/2016 07/08/2015  Does Patient Have a Medical Advance Directive? Yes Yes Yes Yes Yes Yes  Type of Paramedic of [place];Living will Healthcare Power of [place];Living will [place];Living will [place];Living will [place];Living will  Does patient want to make changes to medical advance directive? No - Patient declined No - Patient declined No - Patient declined No - Patient declined - No - Patient declined  Copy of [place] in Chart? No - copy  requested No - copy requested No - copy requested No - copy requested No - copy requested No - copy requested    Tobacco Social History   Tobacco Use  Smoking Status Never Smoker  Smokeless Tobacco Never Used     Counseling given: Not Answered   Clinical Intake:     Pain : No/denies pain    Past Medical History:  Diagnosis Date  . Allergy   . Arthritis   . Cancer [facility])    breast cancer   . Colon polyps   . Diverticulitis   . Diverticulosis 01/04/2014  . Glaucoma   . Hernia, inguinal, right   . History of chicken pox    childhood  . History of hiatal hernia   . HTN (hypertension)   . Hyperglycemia 09/05/2013  . Lipoma of arm 12/09/2014   right  . Nocturia 02/08/2013  . Osteopenia 12/09/2014  . Personal history of colonic polyps 09/05/2013  . Pre-diabetes   . Preventative health care 08/14/2016  . Unspecified constipation 02/06/2013  . Urinary incontinence   . Viral infection characterized by skin and mucous membrane lesions 05/11/2013  . Wears glasses    Past Surgical History:  Procedure Laterality Date  . ABDOMINAL HYSTERECTOMY    . APPENDECTOMY    . BLADDER SURGERY  2003   bladder tact  . BREAST LUMPECTOMY Right   . BREAST LUMPECTOMY WITH RADIOACTIVE SEED LOCALIZATION Right 04/12/2018   Procedure: BREAST LUMPECTOMY WITH RADIOACTIVE SEED LOCALIZATION;  Surgeon: [name], MD;  Location: [place];  Service: General;  Laterality:  Right;  Marland Kitchen CATARACT EXTRACTION, BILATERAL    . COLONOSCOPY W/ BIOPSIES AND POLYPECTOMY    . EYE SURGERY     multiple bilateral  . INGUINAL HERNIA REPAIR Right 04/12/2018   Procedure: OPEN RIGHT INGUINAL HERNIA REPAIR WITH MESH;  Surgeon: [name], MD;  Location: [place];  Service: General;  Laterality: Right;  . RECTOCELE REPAIR Bilateral    Family History  Problem Relation Age of Onset  . Colon cancer Mother   . Hypertension Mother   . Heart disease Father   . Meniere's disease Father   . Heart disease Sister   . Arthritis Son         back  . Glaucoma Son   . Hypertension Son   . Cataracts Son   . Colon cancer Sister   . Alzheimer's disease Sister   . Glaucoma Sister   . Glaucoma Sister   . Hyperlipidemia Sister   . Hypertension Sister   . Cancer Sister        thyroid  . Diabetes Sister   . Glaucoma Sister    Social History   Socioeconomic History  . Marital status: Married    Spouse name: Not on file  . Number of children: 4  . Years of education: Not on file  . Highest education level: Not on file  Occupational History  . Not on file  Social Needs  . Financial resource strain: Not on file  . Food insecurity    Worry: Not on file    Inability: Not on file  . Transportation needs    Medical: Not on file    Non-medical: Not on file  Tobacco Use  . Smoking status: Never Smoker  . Smokeless tobacco: Never Used  Substance and Sexual Activity  . Alcohol use: No  . Drug use: No  . Sexual activity: Not Currently  Lifestyle  . Physical activity    Days per week: Not on file    Minutes per session: Not on file  . Stress: Not on file  Relationships  . Social Herbalist on phone: Not on file    Gets together: Not on file    Attends religious service: Not on file    Active member of club or organization: Not on file    Attends meetings of clubs or organizations: Not on file    Relationship status: Not on file  Other Topics Concern  . Not on file  Social History Narrative  . Not on file    Outpatient Encounter Medications as of 04/21/2019  Medication Sig  . Artificial Tear Solution (GENTEAL TEARS OP) Place 1 drop into both eyes 2 (two) times daily.  . cholecalciferol (VITAMIN D) 1000 units tablet Take 1 tablet (1,000 Units total) by mouth daily.  . Fiber POWD Take 1 Scoop by mouth daily.  Marland Kitchen losartan (COZAAR) 25 MG tablet Take 1 tablet (25 mg total) by mouth daily.  . Probiotic Product (PROBIOTIC DAILY PO) Take 1 capsule by mouth daily.   . sodium chloride (MURO 128) 2 % ophthalmic  solution Place 1 drop into both eyes.  Marland Kitchen linaclotide (LINZESS) 145 MCG CAPS capsule Take 1 capsule (145 mcg total) by mouth daily before breakfast. (Patient not taking: Reported on 04/21/2019)   No facility-administered encounter medications on file as of 04/21/2019.     Activities of Daily Living In your present state of health, do you have any difficulty performing the following activities: 04/21/2019  Hearing? N  Vision? N  Difficulty concentrating or making decisions? N  Walking or climbing stairs? N  Dressing or bathing? N  Doing errands, shopping? N  Preparing Food and eating ? N  Using the Toilet? N  In the past six months, have you accidently leaked urine? N  Do you have problems with loss of bowel control? N  Managing your Medications? N  Managing your Finances? N  Housekeeping or managing your Housekeeping? N  Some recent data might be hidden    Patient Care Team: Mosie Lukes, MD as PCP - General (Family Medicine) Jerline Pain Mingo Amber, DO as Consulting Physician (Osteopathic Medicine) Rolan Lipa, MD as Consulting Physician (Gastroenterology) Stanford Breed Denice Bors, MD as Consulting Physician (Cardiology) [name], MD as Consulting Physician (General Surgery) Magrinat, Virgie Dad, MD as Consulting Physician (Oncology) Eppie Gibson, MD as Attending Physician (Radiation Oncology)    Assessment:   This is a routine wellness examination for Amber Morgan. Physical assessment deferred to PCP.  Exercise Activities and Dietary recommendations Current Exercise Habits: Home exercise routine, Type of exercise: walking, Time (Minutes): 30, Frequency (Times/Week): 7, Weekly Exercise (Minutes/Week): 210, Exercise limited by: None identified   Diet (meal preparation, eat out, water intake, caffeinated beverages, dairy products, fruits and vegetables): in general, a "healthy" diet  , well balanced, on average, 3 meals per day Breakfast: Fiber one cereal, banana Lunch: sandwich and fruit  Dinner:  Pintos and salad Drinks 4 glasses of water per day.  Goals    . <enter goal here> (pt-stated)     Patient states she would like to decrease A1C.    . maintain current health        Fall Risk Fall Risk  04/21/2019 02/15/2018 06/04/2017 02/22/2017 02/28/2016  Falls in the past year? 0 No No No No    Depression Screen PHQ 2/9 Scores 04/21/2019 02/15/2018 06/04/2017 02/22/2017  PHQ - 2 Score 0 0 0 0     Cognitive Function Ad8 score reviewed for issues:  Issues making decisions:no  Less interest in hobbies / activities:no  Repeats questions, stories (family complaining):no  Trouble using ordinary gadgets (microwave, computer, phone):no  Forgets the month or year: no  Mismanaging finances: no  Remembering appts:no  Daily problems with thinking and/or memory:no Ad8 score is=0   MMSE - Mini Mental State Exam 02/22/2017  Orientation to time 5  Orientation to Place 5  Registration 3  Attention/ Calculation 4  Recall 1  Language- name 2 objects 2  Language- repeat 1  Language- follow 3 step command 3  Language- read & follow direction 1  Write a sentence 0  Copy design 1  Total score 26        Immunization History  Administered Date(s) Administered  . Influenza Split 07/13/2012  . Influenza, High Dose Seasonal PF 06/22/2016, 06/04/2017, 06/07/2018  . Influenza,inj,Quad PF,6+ Mos 06/22/2013, 06/01/2014, 07/08/2015  . Pneumococcal Conjugate-13 06/01/2014  . Pneumococcal Polysaccharide-23 12/07/2009  . Tdap 05/21/2013  . Zoster 07/14/2011  . Zoster Recombinat (Shingrix) 07/07/2018    Screening Tests Health Maintenance  Topic Date Due  . INFLUENZA VACCINE  05/06/2019  . MAMMOGRAM  03/05/2020  . TETANUS/TDAP  05/22/2023  . DEXA SCAN  Completed  . PNA vac Low Risk Adult  Completed     Plan:    Please schedule your next medicare wellness visit with me in 1 yr.  Continue to eat heart healthy diet (full of fruits, vegetables, whole grains, lean protein,  water--limit salt, fat, and sugar intake) and  increase physical activity as tolerated.  Continue doing brain stimulating activities (puzzles, reading, adult coloring books, staying active) to keep memory sharp.   Bring a copy of your living will and/or healthcare power of attorney to your next office visit.   I have personally reviewed and noted the following in the patient's chart:   . Medical and social history . Use of alcohol, tobacco or illicit drugs  . Current medications and supplements . Functional ability and status . Nutritional status . Physical activity . Advanced directives . List of other physicians . Hospitalizations, surgeries, and ER visits in previous 12 months . Vitals . Screenings to include cognitive, depression, and falls . Referrals and appointments  In addition, I have reviewed and discussed with patient certain preventive protocols, quality metrics, and best practice recommendations. A written personalized care plan for preventive services as well as general preventive health recommendations were provided to patient.     Shela Nevin, [place]  04/21/2019

## 2019-04-21 ENCOUNTER — Ambulatory Visit (INDEPENDENT_AMBULATORY_CARE_PROVIDER_SITE_OTHER): Payer: Medicare HMO | Admitting: *Deleted

## 2019-04-21 ENCOUNTER — Encounter: Payer: Self-pay | Admitting: *Deleted

## 2019-04-21 ENCOUNTER — Other Ambulatory Visit: Payer: Self-pay

## 2019-04-21 VITALS — BP 126/62

## 2019-04-21 DIAGNOSIS — Z0000 Encounter for general adult medical examination without abnormal findings: Secondary | ICD-10-CM

## 2019-04-21 NOTE — Patient Instructions (Signed)
Please schedule your next medicare wellness visit with me in 1 yr.  Continue to eat heart healthy diet (full of fruits, vegetables, whole grains, lean protein, water--limit salt, fat, and sugar intake) and increase physical activity as tolerated.  Continue doing brain stimulating activities (puzzles, reading, adult coloring books, staying active) to keep memory sharp.   Bring a copy of your living will and/or healthcare power of attorney to your next office visit.   Amber Morgan , Thank you for taking time to come for your Medicare Wellness Visit. I appreciate your ongoing commitment to your health goals. Please review the following plan we discussed and let me know if I can assist you in the future.   These are the goals we discussed: Goals    . <enter goal here> (pt-stated)     Patient states she would like to decrease A1C.    . maintain current health        This is a list of the screening recommended for you and due dates:  Health Maintenance  Topic Date Due  . Flu Shot  05/06/2019  . Mammogram  03/05/2020  . Tetanus Vaccine  05/22/2023  . DEXA scan (bone density measurement)  Completed  . Pneumonia vaccines  Completed    Health Maintenance After Age 41 After age 64, you are at a higher risk for certain long-term diseases and infections as well as injuries from falls. Falls are a major cause of broken bones and head injuries in people who are older than age 33. Getting regular preventive care can help to keep you healthy and well. Preventive care includes getting regular testing and making lifestyle changes as recommended by your health care provider. Talk with your health care provider about:  Which screenings and tests you should have. A screening is a test that checks for a disease when you have no symptoms.  A diet and exercise plan that is right for you. What should I know about screenings and tests to prevent falls? Screening and testing are the best ways to find a  health problem early. Early diagnosis and treatment give you the best chance of managing medical conditions that are common after age 53. Certain conditions and lifestyle choices may make you more likely to have a fall. Your health care provider may recommend:  Regular vision checks. Poor vision and conditions such as cataracts can make you more likely to have a fall. If you wear glasses, make sure to get your prescription updated if your vision changes.  Medicine review. Work with your health care provider to regularly review all of the medicines you are taking, including over-the-counter medicines. Ask your health care provider about any side effects that may make you more likely to have a fall. Tell your health care provider if any medicines that you take make you feel dizzy or sleepy.  Osteoporosis screening. Osteoporosis is a condition that causes the bones to get weaker. This can make the bones weak and cause them to break more easily.  Blood pressure screening. Blood pressure changes and medicines to control blood pressure can make you feel dizzy.  Strength and balance checks. Your health care provider may recommend certain tests to check your strength and balance while standing, walking, or changing positions.  Foot health exam. Foot pain and numbness, as well as not wearing proper footwear, can make you more likely to have a fall.  Depression screening. You may be more likely to have a fall if you have a  fear of falling, feel emotionally low, or feel unable to do activities that you used to do.  Alcohol use screening. Using too much alcohol can affect your balance and may make you more likely to have a fall. What actions can I take to lower my risk of falls? General instructions  Talk with your health care provider about your risks for falling. Tell your health care provider if: ? You fall. Be sure to tell your health care provider about all falls, even ones that seem minor. ? You feel  dizzy, sleepy, or off-balance.  Take over-the-counter and prescription medicines only as told by your health care provider. These include any supplements.  Eat a healthy diet and maintain a healthy weight. A healthy diet includes low-fat dairy products, low-fat (lean) meats, and fiber from whole grains, beans, and lots of fruits and vegetables. Home safety  Remove any tripping hazards, such as rugs, cords, and clutter.  Install safety equipment such as grab bars in bathrooms and safety rails on stairs.  Keep rooms and walkways well-lit. Activity   Follow a regular exercise program to stay fit. This will help you maintain your balance. Ask your health care provider what types of exercise are appropriate for you.  If you need a cane or walker, use it as recommended by your health care provider.  Wear supportive shoes that have nonskid soles. Lifestyle  Do not drink alcohol if your health care provider tells you not to drink.  If you drink alcohol, limit how much you have: ? 0-1 drink a day for women. ? 0-2 drinks a day for men.  Be aware of how much alcohol is in your drink. In the [place] one drink equals one typical bottle of beer (12 oz), one-half glass of wine (5 oz), or one shot of hard liquor (1 oz).  Do not use any products that contain nicotine or tobacco, such as cigarettes and e-cigarettes. If you need help quitting, ask your health care provider. Summary  Having a healthy lifestyle and getting preventive care can help to protect your health and wellness after age 75.  Screening and testing are the best way to find a health problem early and help you avoid having a fall. Early diagnosis and treatment give you the best chance for managing medical conditions that are more common for people who are older than age 32.  Falls are a major cause of broken bones and head injuries in people who are older than age 55. Take precautions to prevent a fall at home.  Work with your  health care provider to learn what changes you can make to improve your health and wellness and to prevent falls. This information is not intended to replace advice given to you by your health care provider. Make sure you discuss any questions you have with your health care provider. Document Released: 08/04/2017 Document Revised: 01/12/2019 Document Reviewed: 08/04/2017 Elsevier Patient Education  2020 [employer].

## 2019-04-25 ENCOUNTER — Ambulatory Visit: Payer: Medicare HMO | Admitting: Oncology

## 2019-04-25 ENCOUNTER — Other Ambulatory Visit: Payer: Medicare HMO

## 2019-05-04 ENCOUNTER — Telehealth: Payer: Self-pay | Admitting: Oncology

## 2019-05-04 NOTE — Telephone Encounter (Signed)
Confirmed appt and verified info. °

## 2019-05-04 NOTE — Progress Notes (Signed)
[place]  [phone] Fax:([phone]) [phone]     ID: Amber Morgan DOB: [birth year]  MR#: [id number]  RKY#:[id number]  Patient Care Team: Mosie Lukes, MD as PCP - General (Family Medicine) Jerline Pain Mingo Amber, DO as Consulting Physician (Osteopathic Medicine) Rolan Lipa, MD as Consulting Physician (Gastroenterology) Stanford Breed Denice Bors, MD as Consulting Physician (Cardiology) [name], MD as Consulting Physician (General Surgery) Heydi Swango, Virgie Dad, MD as Consulting Physician (Oncology) Eppie Gibson, MD as Attending Physician (Radiation Oncology) OTHER MD:  CHIEF COMPLAINT: Estrogen receptor positive lobular breast cancer  CURRENT TREATMENT: Observation  I connected with Amber Morgan on 05/05/19 at 11:45 AM EDT by telephone visit and verified that I am speaking with the correct person using two identifiers.   I discussed the limitations, risks, security and privacy concerns of performing an evaluation and management service by telemedicine and the availability of in-person appointments. I also discussed with the patient that there may be a patient responsible charge related to this service. The patient expressed understanding and agreed to proceed.   Other persons participating in the visit and their role in the encounter:   - Biomedical scientist, Medical Scribe   Patient's location: home  Provider's location: [place]: From the original intake note:  Amber Morgan had routine screening mammography on 03/03/2018 showing a possible abnormality in the right breast. She underwent unilateral right diagnostic mammography with tomography and right breast ultrasonography at [facility] on 03/14/2018 showing: breast density category C. In the right breast, at the 1 o'clock upper inner quadrant there is a hypoechoic mass measuring 0.9 x 0.9 x 0.7 cm located 5 cm from the nipple. Ultrasonography revealed  no evidence of lymphadenopathy in the right axilla.   Accordingly on 03/18/2018 she proceeded to biopsy of the right breast area in question. The pathology from this procedure showed ([id number]): Invasive lobular carcinoma grade II, spanning 0.6 cm. Prognostic indicators significant for: estrogen receptor, 100% positive and progesterone receptor, 100% positive, both with strong staining intensity. Proliferation marker Ki67 at 2%. HER2 not amplified with ratios HER2/CEP17 signals 1.62 and average HER2 copies per cell 3.80  The patient's subsequent history is as detailed below.   INTERVAL HISTORY: Amber Morgan is contacted today for follow-up and treatment of her estrogen receptor positive lobular breast cancer.  She continues under observation.  Since her last visit here, she underwent a digital diagnostic bilateral mammogram with tomography on 03/06/2019 showing: Breast Density Category C. There is no mammographic evidence of malignancy.     REVIEW OF SYSTEMS: Amber Morgan has been taking appropriate precautions amid the pandemic. She does do minimal grocery shopping, but her sons do most of the grocery shopping for her. For exercise, she does a lot of walking. A detailed review of systems was otherwise noncontributory.    PAST MEDICAL HISTORY: Past Medical History:  Diagnosis Date  . Allergy   . Arthritis   . Cancer [facility])    breast cancer   . Colon polyps   . Diverticulitis   . Diverticulosis 01/04/2014  . Glaucoma   . Hernia, inguinal, right   . History of chicken pox    childhood  . History of hiatal hernia   . HTN (hypertension)   . Hyperglycemia 09/05/2013  . Lipoma of arm 12/09/2014   right  . Nocturia 02/08/2013  . Osteopenia 12/09/2014  . Personal history of colonic polyps 09/05/2013  . Pre-diabetes   .  Preventative health care 08/14/2016  . Unspecified constipation 02/06/2013  . Urinary incontinence   . Viral infection characterized by skin and mucous membrane lesions 05/11/2013  . Wears  glasses   Glaucoma   PAST SURGICAL HISTORY: Past Surgical History:  Procedure Laterality Date  . ABDOMINAL HYSTERECTOMY    . APPENDECTOMY    . BLADDER SURGERY  2003   bladder tact  . BREAST LUMPECTOMY Right   . BREAST LUMPECTOMY WITH RADIOACTIVE SEED LOCALIZATION Right 04/12/2018   Procedure: BREAST LUMPECTOMY WITH RADIOACTIVE SEED LOCALIZATION;  Surgeon: [name], MD;  Location: [place];  Service: General;  Laterality: Right;  . CATARACT EXTRACTION, BILATERAL    . COLONOSCOPY W/ BIOPSIES AND POLYPECTOMY    . EYE SURGERY     multiple bilateral  . INGUINAL HERNIA REPAIR Right 04/12/2018   Procedure: OPEN RIGHT INGUINAL HERNIA REPAIR WITH MESH;  Surgeon: [name], MD;  Location: [place];  Service: General;  Laterality: Right;  . RECTOCELE REPAIR Bilateral   Appendectomy and planned Cholescystectomy   FAMILY HISTORY Family History  Problem Relation Age of Onset  . Colon cancer Mother   . Hypertension Mother   . Heart disease Father   . Meniere's disease Father   . Heart disease Sister   . Arthritis Son        back  . Glaucoma Son   . Hypertension Son   . Cataracts Son   . Colon cancer Sister   . Alzheimer's disease Sister   . Glaucoma Sister   . Glaucoma Sister   . Hyperlipidemia Sister   . Hypertension Sister   . Cancer Sister        thyroid  . Diabetes Sister   . Glaucoma Sister    The patient's father died at age 42 due to MI. The patient's mother died at 65. The patient's mother had colon cancer diagnosed at age 55. There was a paternal aunt with breast cancer diagnosed at an old age. The patient's sister was diagnosed with colon cancer at age 40. She denies a history of ovarian cancer in the family.    GYNECOLOGIC HISTORY:  No LMP recorded. Patient has had a hysterectomy. Menarche: 85 years old Age at first live birth: 85 years old She is GXP4. She is status post total hysterectomy with bilateral salpingo-oophorectomy in 1978. The patient briefly took HRT,  and she didn't have intense hot flashes. She never used contraception.    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 Her husband died in 03-31-18 due to lung issues. At home is herself with no pets.  She was going to [facility] to [facility] [address] in March 2020 but this has been postponed because of the pandemic. The patient's son, Fritz Pickerel is retired from Research officer, trade union and [employer]. The patient's son [name] is retired after 30 years in the [employer]. The patient's son, Alveta Heimlich is a Designer, industrial/product in [place]. The patient's son Monica Martinez, is an Scientist, research (physical sciences) in [place]. The patient has 8 grandchildren and 8 great- grandchildren. The patient attends [employer].   ADVANCED DIRECTIVES: The patient's son- Cadey Bazile [phone] is her healthcare power of attorney   HEALTH MAINTENANCE: Social History   Tobacco Use  . Smoking status: Never Smoker  . Smokeless tobacco: Never Used  Substance Use Topics  . Alcohol use: No  . Drug use: No     Colonoscopy: 12/2013 / Dr. Sherrye Payor / polyp removal  PAP:   Bone density: 12/18/16 T  score of -0.6   Allergies  Allergen Reactions  . Amlodipine Other (See Comments)    Mental status chnages ie confusion  . Doxycycline Rash    Current Outpatient Medications  Medication Sig Dispense Refill  . Artificial Tear Solution (GENTEAL TEARS OP) Place 1 drop into both eyes 2 (two) times daily.    . cholecalciferol (VITAMIN D) 1000 units tablet Take 1 tablet (1,000 Units total) by mouth daily.    . Fiber POWD Take 1 Scoop by mouth daily.    Marland Kitchen linaclotide (LINZESS) 145 MCG CAPS capsule Take 1 capsule (145 mcg total) by mouth daily before breakfast. (Patient not taking: Reported on 04/21/2019) 30 capsule 2  . losartan (COZAAR) 25 MG tablet Take 1 tablet (25 mg total) by mouth daily. 30 tablet 5  . Probiotic Product (PROBIOTIC DAILY PO) Take 1 capsule by mouth daily.     . sodium chloride (MURO 128) 2 % ophthalmic solution Place 1  drop into both eyes.     No current facility-administered medications for this visit.     OBJECTIVE: Older white woman who appears stated age  There were no vitals filed for this visit.   There is no height or weight on file to calculate BMI.   Wt Readings from Last 3 Encounters:  12/05/18 123 lb 9.6 oz (56.1 kg)  11/14/18 115 lb 1.6 oz (52.2 kg)  10/27/18 121 lb (54.9 kg)      ECOG FS:1 - Symptomatic but completely ambulatory  Telehealth Visit   LAB RESULTS:  CMP     Component Value Date/Time   NA 140 12/05/2018 1151   K 5.0 12/05/2018 1151   CL 100 12/05/2018 1151   CO2 31 12/05/2018 1151   GLUCOSE 91 12/05/2018 1151   BUN 26 (H) 12/05/2018 1151   CREATININE 0.90 12/05/2018 1151   CREATININE 0.97 03/30/2018 1214   CREATININE 0.79 05/25/2014 0853   CALCIUM 10.6 (H) 12/05/2018 1151   PROT 6.6 12/05/2018 1151   ALBUMIN 4.3 12/05/2018 1151   Morgan 20 12/05/2018 1151   Morgan 20 03/30/2018 1214   ALT 20 12/05/2018 1151   ALT 21 03/30/2018 1214   ALKPHOS 50 12/05/2018 1151   BILITOT 0.4 12/05/2018 1151   BILITOT 0.3 03/30/2018 1214   GFRNONAA 51 (L) 03/30/2018 1214   GFRAA 59 (L) 03/30/2018 1214    No results found for: TOTALPROTELP, ALBUMINELP, A1GS, A2GS, BETS, BETA2SER, GAMS, MSPIKE, SPEI  No results found for: KPAFRELGTCHN, LAMBDASER, KAPLAMBRATIO  Lab Results  Component Value Date   WBC 9.9 12/05/2018   NEUTROABS 4.0 10/27/2018   HGB 13.0 12/05/2018   HCT 38.8 12/05/2018   MCV 95.3 12/05/2018   PLT 264.0 12/05/2018    @LASTCHEMISTRY @  No results found for: LABCA2  No components found for: [id number]  No results for input(s): INR in the last 168 hours.  No results found for: LABCA2  No results found for: BSW967  No results found for: RFF638  No results found for: GYK599  No results found for: CA2729  No components found for: HGQUANT  No results found for: CEA1 / No results found for: CEA1   No results found for: AFPTUMOR  No results found  for: CHROMOGRNA  No results found for: PSA1  No visits with results within 3 Day(s) from this visit.  Latest known visit with results is:  Lab on 12/06/2018  Component Date Value Ref Range Status  . VITD 12/06/2018 61.83  30.00 - 100.00 ng/mL Final    (  this displays the last labs from the last 3 days)  No results found for: TOTALPROTELP, ALBUMINELP, A1GS, A2GS, BETS, BETA2SER, GAMS, MSPIKE, SPEI (this displays SPEP labs)  No results found for: KPAFRELGTCHN, LAMBDASER, KAPLAMBRATIO (kappa/lambda light chains)  No results found for: HGBA, HGBA2QUANT, HGBFQUANT, HGBSQUAN (Hemoglobinopathy evaluation)   No results found for: LDH  No results found for: IRON, TIBC, IRONPCTSAT (Iron and TIBC)  No results found for: FERRITIN  Urinalysis    Component Value Date/Time   COLORURINE YELLOW 12/05/2018 1151   APPEARANCEUR CLEAR 12/05/2018 1151   LABSPEC 1.015 12/05/2018 1151   PHURINE 7.5 12/05/2018 1151   GLUCOSEU NEGATIVE 12/05/2018 1151   HGBUR NEGATIVE 12/05/2018 1151   BILIRUBINUR NEGATIVE 12/05/2018 1151   BILIRUBINUR Negative 10/27/2018 1135   KETONESUR NEGATIVE 12/05/2018 1151   PROTEINUR Negative 10/27/2018 1135   PROTEINUR NEG 02/06/2013 1053   UROBILINOGEN 0.2 12/05/2018 1151   NITRITE NEGATIVE 12/05/2018 [place] 12/05/2018 1151     STUDIES: No results found.  ELIGIBLE FOR AVAILABLE RESEARCH PROTOCOL: no  ASSESSMENT: 85 y.o. [place], [place] woman status post right breast upper inner quadrant biopsy 03/18/2018 for a clinical T1b No, stage IA base of lobular carcinoma, grade 2, estrogen and progesterone receptor positive, HER-2 not amplified, with an MIB-1 of 2%.  (1) status post right lumpectomy with no sentinel lymph node sampling 04/12/2018 for a pT1c cN0, stage IA invasive lobular carcinoma, with negative margins  (2) no adjuvant radiation indicated  (3) anastrozole prescribed 05/20/2018 but never started by the patient  (a) bone density  03/03/2018 finds a T score of -1.8 (osteopenia).  PLAN: Amber Morgan is now a year out from definitive surgery for breast cancer with no evidence of disease activity.  This is favorable.  We discussed her mammogram from June of this year which was very favorable.  We are continuing observation.  She will see me again a year from now, after her next year's mammogram.  I commended her exercise program.  She knows to call for any other issues that may develop before the next visit.   Koden Hunzeker, Virgie Dad, MD  05/05/19 12:08 PM Medical Oncology and Hematology [facility] [address] [place], [place] [address] Tel. [phone]    Fax. [phone]   I, Jacqualyn Posey am acting as a Education administrator for Chauncey Cruel, MD.   I, Lurline Del MD, have reviewed the above documentation for accuracy and completeness, and I agree with the above.

## 2019-05-05 ENCOUNTER — Inpatient Hospital Stay: Payer: Medicare HMO | Attending: Oncology | Admitting: Oncology

## 2019-05-05 DIAGNOSIS — Z170 Estrogen receptor positive status [ER+]: Secondary | ICD-10-CM | POA: Diagnosis not present

## 2019-05-05 DIAGNOSIS — Z79811 Long term (current) use of aromatase inhibitors: Secondary | ICD-10-CM | POA: Diagnosis not present

## 2019-05-05 DIAGNOSIS — C50211 Malignant neoplasm of upper-inner quadrant of right female breast: Secondary | ICD-10-CM | POA: Diagnosis not present

## 2019-05-05 DIAGNOSIS — Z923 Personal history of irradiation: Secondary | ICD-10-CM | POA: Diagnosis not present

## 2019-06-05 DIAGNOSIS — Z1383 Encounter for screening for respiratory disorder NEC: Secondary | ICD-10-CM | POA: Diagnosis not present

## 2019-06-05 DIAGNOSIS — Z20828 Contact with and (suspected) exposure to other viral communicable diseases: Secondary | ICD-10-CM | POA: Diagnosis not present

## 2019-07-03 DIAGNOSIS — Z961 Presence of intraocular lens: Secondary | ICD-10-CM | POA: Diagnosis not present

## 2019-07-03 DIAGNOSIS — H44522 Atrophy of globe, left eye: Secondary | ICD-10-CM | POA: Diagnosis not present

## 2019-07-03 DIAGNOSIS — H35351 Cystoid macular degeneration, right eye: Secondary | ICD-10-CM | POA: Diagnosis not present

## 2019-07-03 DIAGNOSIS — H18422 Band keratopathy, left eye: Secondary | ICD-10-CM | POA: Diagnosis not present

## 2019-07-03 DIAGNOSIS — H401113 Primary open-angle glaucoma, right eye, severe stage: Secondary | ICD-10-CM | POA: Diagnosis not present

## 2019-07-31 ENCOUNTER — Other Ambulatory Visit: Payer: Self-pay | Admitting: *Deleted

## 2019-07-31 MED ORDER — LOSARTAN POTASSIUM 25 MG PO TABS: 25.0000 mg | ORAL_TABLET | Freq: Every day | ORAL | 0 refills | Status: DC

## 2019-08-18 DIAGNOSIS — H35361 Drusen (degenerative) of macula, right eye: Secondary | ICD-10-CM | POA: Diagnosis not present

## 2019-08-18 DIAGNOSIS — H35351 Cystoid macular degeneration, right eye: Secondary | ICD-10-CM | POA: Diagnosis not present

## 2019-08-18 DIAGNOSIS — H211X2 Other vascular disorders of iris and ciliary body, left eye: Secondary | ICD-10-CM | POA: Diagnosis not present

## 2019-08-18 DIAGNOSIS — H35371 Puckering of macula, right eye: Secondary | ICD-10-CM | POA: Diagnosis not present

## 2019-09-08 DIAGNOSIS — H35361 Drusen (degenerative) of macula, right eye: Secondary | ICD-10-CM | POA: Diagnosis not present

## 2019-09-08 DIAGNOSIS — H35351 Cystoid macular degeneration, right eye: Secondary | ICD-10-CM | POA: Diagnosis not present

## 2019-09-08 DIAGNOSIS — H43811 Vitreous degeneration, right eye: Secondary | ICD-10-CM | POA: Diagnosis not present

## 2019-09-08 DIAGNOSIS — H35371 Puckering of macula, right eye: Secondary | ICD-10-CM | POA: Diagnosis not present

## 2019-09-15 NOTE — Progress Notes (Deleted)
HPI: FU HTN and bradycardia. Noted to be bradycardic with heart rate in the 40s previously and atenolol discontinued. Amlodipine added.Carotid Dopplers June 2017 negative.Since last seen,   Current Outpatient Medications  Medication Sig Dispense Refill  . Artificial Tear Solution (GENTEAL TEARS OP) Place 1 drop into both eyes 2 (two) times daily.    . cholecalciferol (VITAMIN D) 1000 units tablet Take 1 tablet (1,000 Units total) by mouth daily.    . Fiber POWD Take 1 Scoop by mouth daily.    Marland Kitchen linaclotide (LINZESS) 145 MCG CAPS capsule Take 1 capsule (145 mcg total) by mouth daily before breakfast. (Patient not taking: Reported on 04/21/2019) 30 capsule 2  . losartan (COZAAR) 25 MG tablet Take 1 tablet (25 mg total) by mouth daily. 90 tablet 0  . Probiotic Product (PROBIOTIC DAILY PO) Take 1 capsule by mouth daily.     . sodium chloride (MURO 128) 2 % ophthalmic solution Place 1 drop into both eyes.     No current facility-administered medications for this visit.     Past Medical History:  Diagnosis Date  . Allergy   . Arthritis   . Cancer [facility])    breast cancer   . Colon polyps   . Diverticulitis   . Diverticulosis 01/04/2014  . Glaucoma   . Hernia, inguinal, right   . History of chicken pox    childhood  . History of hiatal hernia   . HTN (hypertension)   . Hyperglycemia 09/05/2013  . Lipoma of arm 12/09/2014   right  . Nocturia 02/08/2013  . Osteopenia 12/09/2014  . Personal history of colonic polyps 09/05/2013  . Pre-diabetes   . Preventative health care 08/14/2016  . Unspecified constipation 02/06/2013  . Urinary incontinence   . Viral infection characterized by skin and mucous membrane lesions 05/11/2013  . Wears glasses     Past Surgical History:  Procedure Laterality Date  . ABDOMINAL HYSTERECTOMY    . APPENDECTOMY    . BLADDER SURGERY  2003   bladder tact  . BREAST LUMPECTOMY Right   . BREAST LUMPECTOMY WITH RADIOACTIVE SEED LOCALIZATION Right 04/12/2018   Procedure: BREAST LUMPECTOMY WITH RADIOACTIVE SEED LOCALIZATION;  Surgeon: [name], MD;  Location: [place];  Service: General;  Laterality: Right;  . CATARACT EXTRACTION, BILATERAL    . COLONOSCOPY W/ BIOPSIES AND POLYPECTOMY    . EYE SURGERY     multiple bilateral  . INGUINAL HERNIA REPAIR Right 04/12/2018   Procedure: OPEN RIGHT INGUINAL HERNIA REPAIR WITH MESH;  Surgeon: [name], MD;  Location: [place];  Service: General;  Laterality: Right;  . RECTOCELE REPAIR Bilateral     Social History   Socioeconomic History  . Marital status: Married    Spouse name: Not on file  . Number of children: 4  . Years of education: Not on file  . Highest education level: Not on file  Occupational History  . Not on file  Tobacco Use  . Smoking status: Never Smoker  . Smokeless tobacco: Never Used  Substance and Sexual Activity  . Alcohol use: No  . Drug use: No  . Sexual activity: Not Currently  Other Topics Concern  . Not on file  Social History Narrative  . Not on file   Social Determinants of Health   Financial Resource Strain:   . Difficulty of Paying Living Expenses: Not on file  Food Insecurity:   . Worried About Charity fundraiser in the Last Year:  Not on file  . Ran Out of Food in the Last Year: Not on file  Transportation Needs:   . Lack of Transportation (Medical): Not on file  . Lack of Transportation (Non-Medical): Not on file  Physical Activity:   . Days of Exercise per Week: Not on file  . Minutes of Exercise per Session: Not on file  Stress:   . Feeling of Stress : Not on file  Social Connections:   . Frequency of Communication with Friends and Family: Not on file  . Frequency of Social Gatherings with Friends and Family: Not on file  . Attends Religious Services: Not on file  . Active Member of Clubs or Organizations: Not on file  . Attends Archivist Meetings: Not on file  . Marital Status: Not on file  Intimate Partner Violence:   . Fear of  Current or Ex-Partner: Not on file  . Emotionally Abused: Not on file  . Physically Abused: Not on file  . Sexually Abused: Not on file    Family History  Problem Relation Age of Onset  . Colon cancer Mother   . Hypertension Mother   . Heart disease Father   . Meniere's disease Father   . Heart disease Sister   . Arthritis Son        back  . Glaucoma Son   . Hypertension Son   . Cataracts Son   . Colon cancer Sister   . Alzheimer's disease Sister   . Glaucoma Sister   . Glaucoma Sister   . Hyperlipidemia Sister   . Hypertension Sister   . Cancer Sister        thyroid  . Diabetes Sister   . Glaucoma Sister     ROS: no fevers or chills, productive cough, hemoptysis, dysphasia, odynophagia, melena, hematochezia, dysuria, hematuria, rash, seizure activity, orthopnea, PND, pedal edema, claudication. Remaining systems are negative.  Physical Exam: Well-developed well-nourished in no acute distress.  Skin is warm and dry.  HEENT is normal.  Neck is supple.  Chest is clear to auscultation with normal expansion.  Cardiovascular exam is regular rate and rhythm.  Abdominal exam nontender or distended. No masses palpated. Extremities show no edema. neuro grossly intact  ECG- personally reviewed  A/P  1 hypertension-patient's blood pressure is controlled.  Continue present medical regimen.  2 history of bradycardia-resolved after discontinuing beta-blocker.  We will avoid AV nodal blocking agents in the future.  3 edema-continue present dose of diuretic.  Reasonably well controlled.  This is felt secondary to venous insufficiency.  She will continue with compression hose, feet elevation and low-sodium diet as well.  Kirk Ruths, MD

## 2019-09-20 ENCOUNTER — Ambulatory Visit: Payer: Medicare HMO | Admitting: Cardiology

## 2019-09-21 ENCOUNTER — Other Ambulatory Visit: Payer: Self-pay

## 2019-09-21 ENCOUNTER — Ambulatory Visit (INDEPENDENT_AMBULATORY_CARE_PROVIDER_SITE_OTHER): Payer: Medicare HMO | Admitting: Family Medicine

## 2019-09-21 DIAGNOSIS — E871 Hypo-osmolality and hyponatremia: Secondary | ICD-10-CM

## 2019-09-21 DIAGNOSIS — R1031 Right lower quadrant pain: Secondary | ICD-10-CM | POA: Diagnosis not present

## 2019-09-21 DIAGNOSIS — B379 Candidiasis, unspecified: Secondary | ICD-10-CM | POA: Diagnosis not present

## 2019-09-21 DIAGNOSIS — E785 Hyperlipidemia, unspecified: Secondary | ICD-10-CM

## 2019-09-21 DIAGNOSIS — R739 Hyperglycemia, unspecified: Secondary | ICD-10-CM | POA: Diagnosis not present

## 2019-09-21 DIAGNOSIS — I10 Essential (primary) hypertension: Secondary | ICD-10-CM | POA: Diagnosis not present

## 2019-09-21 DIAGNOSIS — R32 Unspecified urinary incontinence: Secondary | ICD-10-CM

## 2019-09-21 LAB — URINALYSIS
Bilirubin Urine: NEGATIVE
Hgb urine dipstick: NEGATIVE
Ketones, ur: NEGATIVE
Leukocytes,Ua: NEGATIVE
Nitrite: NEGATIVE
Specific Gravity, Urine: 1.02 (ref 1.000–1.030)
Total Protein, Urine: NEGATIVE
Urine Glucose: NEGATIVE
Urobilinogen, UA: 0.2 (ref 0.0–1.0)
pH: 6 (ref 5.0–8.0)

## 2019-09-21 LAB — CBC
HCT: 37.1 % (ref 36.0–46.0)
Hemoglobin: 12.3 g/dL (ref 12.0–15.0)
MCHC: 33.1 g/dL (ref 30.0–36.0)
MCV: 96.2 fl (ref 78.0–100.0)
Platelets: 223 10*3/uL (ref 150.0–400.0)
RBC: 3.85 Mil/uL — ABNORMAL LOW (ref 3.87–5.11)
RDW: 13.4 % (ref 11.5–15.5)
WBC: 6.4 10*3/uL (ref 4.0–10.5)

## 2019-09-21 LAB — COMPREHENSIVE METABOLIC PANEL
ALT: 15 U/L (ref 0–35)
AST: 16 U/L (ref 0–37)
Albumin: 4 g/dL (ref 3.5–5.2)
Alkaline Phosphatase: 52 U/L (ref 39–117)
BUN: 33 mg/dL — ABNORMAL HIGH (ref 6–23)
CO2: 31 mEq/L (ref 19–32)
Calcium: 9.7 mg/dL (ref 8.4–10.5)
Chloride: 106 mEq/L (ref 96–112)
Creatinine, Ser: 0.98 mg/dL (ref 0.40–1.20)
GFR: 53.29 mL/min — ABNORMAL LOW (ref >60.00)
Glucose, Bld: 106 mg/dL — ABNORMAL HIGH (ref 70–99)
Potassium: 4.6 mEq/L (ref 3.5–5.1)
Sodium: 140 mEq/L (ref 135–145)
Total Bilirubin: 0.4 mg/dL (ref 0.2–1.2)
Total Protein: 6.4 g/dL (ref 6.0–8.3)

## 2019-09-21 LAB — VITAMIN D 25 HYDROXY (VIT D DEFICIENCY, FRACTURES): VITD: 54.96 ng/mL (ref 30.00–100.00)

## 2019-09-21 LAB — LIPID PANEL
Cholesterol: 162 mg/dL (ref 0–200)
HDL: 56 mg/dL (ref >39.00)
LDL Cholesterol: 89 mg/dL (ref 0–99)
NonHDL: 105.69
Total CHOL/HDL Ratio: 3
Triglycerides: 82 mg/dL (ref 0.0–149.0)
VLDL: 16.4 mg/dL (ref 0.0–40.0)

## 2019-09-21 LAB — TSH: TSH: 2.29 u[IU]/mL (ref 0.35–4.50)

## 2019-09-21 LAB — HEMOGLOBIN A1C: Hgb A1c MFr Bld: 6.1 % (ref 4.6–6.5)

## 2019-09-21 MED ORDER — NYSTATIN 100000 UNIT/GM EX CREA: 1.0000 "application " | TOPICAL_CREAM | Freq: Two times a day (BID) | CUTANEOUS | Status: DC | PRN

## 2019-09-21 NOTE — Assessment & Plan Note (Signed)
Notes 120s and 130s at rest most days. Up with activity to the 140s.  no changes to meds. Encouraged heart healthy diet such as the DASH diet and exercise as tolerated.

## 2019-09-21 NOTE — Assessment & Plan Note (Signed)
hgba1c acceptable, minimize simple carbs. Increase exercise as tolerated.  

## 2019-09-21 NOTE — Patient Instructions (Addendum)
Cleanse the affected area with [place] twice daily   Hypertension, Adult High blood pressure (hypertension) is when the force of blood pumping through the arteries is too strong. The arteries are the blood vessels that carry blood from the heart throughout the body. Hypertension forces the heart to work harder to pump blood and may cause arteries to become narrow or stiff. Untreated or uncontrolled hypertension can cause a heart attack, heart failure, a stroke, kidney disease, and other problems. A blood pressure reading consists of a higher number over a lower number. Ideally, your blood pressure should be below 120/80. The first ("top") number is called the systolic pressure. It is a measure of the pressure in your arteries as your heart beats. The second ("bottom") number is called the diastolic pressure. It is a measure of the pressure in your arteries as the heart relaxes. What are the causes? The exact cause of this condition is not known. There are some conditions that result in or are related to high blood pressure. What increases the risk? Some risk factors for high blood pressure are under your control. The following factors may make you more likely to develop this condition:  Smoking.  Having type 2 diabetes mellitus, high cholesterol, or both.  Not getting enough exercise or physical activity.  Being overweight.  Having too much fat, sugar, calories, or salt (sodium) in your diet.  Drinking too much alcohol. Some risk factors for high blood pressure may be difficult or impossible to change. Some of these factors include:  Having chronic kidney disease.  Having a family history of high blood pressure.  Age. Risk increases with age.  Race. You may be at higher risk if you are African American.  Gender. Men are at higher risk than women before age 54. After age 87, women are at higher risk than men.  Having obstructive sleep apnea.  Stress. What are the  signs or symptoms? High blood pressure may not cause symptoms. Very high blood pressure (hypertensive crisis) may cause:  Headache.  Anxiety.  Shortness of breath.  Nosebleed.  Nausea and vomiting.  Vision changes.  Severe chest pain.  Seizures. How is this diagnosed? This condition is diagnosed by measuring your blood pressure while you are seated, with your arm resting on a flat surface, your legs uncrossed, and your feet flat on the floor. The cuff of the blood pressure monitor will be placed directly against the skin of your upper arm at the level of your heart. It should be measured at least twice using the same arm. Certain conditions can cause a difference in blood pressure between your right and left arms. Certain factors can cause blood pressure readings to be lower or higher than normal for a short period of time:  When your blood pressure is higher when you are in a health care provider's office than when you are at home, this is called white coat hypertension. Most people with this condition do not need medicines.  When your blood pressure is higher at home than when you are in a health care provider's office, this is called masked hypertension. Most people with this condition may need medicines to control blood pressure. If you have a high blood pressure reading during one visit or you have normal blood pressure with other risk factors, you may be asked to:  Return on a different day to have your blood pressure checked again.  Monitor your blood pressure at home for 1 week or longer.  If you are diagnosed with hypertension, you may have other blood or imaging tests to help your health care provider understand your overall risk for other conditions. How is this treated? This condition is treated by making healthy lifestyle changes, such as eating healthy foods, exercising more, and reducing your alcohol intake. Your health care provider may prescribe medicine if lifestyle  changes are not enough to get your blood pressure under control, and if:  Your systolic blood pressure is above 130.  Your diastolic blood pressure is above 80. Your personal target blood pressure may vary depending on your medical conditions, your age, and other factors. Follow these instructions at home: Eating and drinking   Eat a diet that is high in fiber and potassium, and low in sodium, added sugar, and fat. An example eating plan is called the DASH (Dietary Approaches to Stop Hypertension) diet. To eat this way: ? Eat plenty of fresh fruits and vegetables. Try to fill one half of your plate at each meal with fruits and vegetables. ? Eat whole grains, such as whole-wheat pasta, brown rice, or whole-grain bread. Fill about one fourth of your plate with whole grains. ? Eat or drink low-fat dairy products, such as skim milk or low-fat yogurt. ? Avoid fatty cuts of meat, processed or cured meats, and poultry with skin. Fill about one fourth of your plate with lean proteins, such as fish, chicken without skin, beans, eggs, or tofu. ? Avoid pre-made and processed foods. These tend to be higher in sodium, added sugar, and fat.  Reduce your daily sodium intake. Most people with hypertension should eat less than 1,500 mg of sodium a day.  Do not drink alcohol if: ? Your health care provider tells you not to drink. ? You are pregnant, may be pregnant, or are planning to become pregnant.  If you drink alcohol: ? Limit how much you use to:  0-1 drink a day for women.  0-2 drinks a day for men. ? Be aware of how much alcohol is in your drink. In the [place] one drink equals one 12 oz bottle of beer (355 mL), one 5 oz glass of wine (148 mL), or one 1 oz glass of hard liquor (44 mL). Lifestyle   Work with your health care provider to maintain a healthy body weight or to lose weight. Ask what an ideal weight is for you.  Get at least 30 minutes of exercise most days of the week. Activities  may include walking, swimming, or biking.  Include exercise to strengthen your muscles (resistance exercise), such as Pilates or lifting weights, as part of your weekly exercise routine. Try to do these types of exercises for 30 minutes at least 3 days a week.  Do not use any products that contain nicotine or tobacco, such as cigarettes, e-cigarettes, and chewing tobacco. If you need help quitting, ask your health care provider.  Monitor your blood pressure at home as told by your health care provider.  Keep all follow-up visits as told by your health care provider. This is important. Medicines  Take over-the-counter and prescription medicines only as told by your health care provider. Follow directions carefully. Blood pressure medicines must be taken as prescribed.  Do not skip doses of blood pressure medicine. Doing this puts you at risk for problems and can make the medicine less effective.  Ask your health care provider about side effects or reactions to medicines that you should watch for. Contact a health care provider if  you:  Think you are having a reaction to a medicine you are taking.  Have headaches that keep coming back (recurring).  Feel dizzy.  Have swelling in your ankles.  Have trouble with your vision. Get help right away if you:  Develop a severe headache or confusion.  Have unusual weakness or numbness.  Feel faint.  Have severe pain in your chest or abdomen.  Vomit repeatedly.  Have trouble breathing. Summary  Hypertension is when the force of blood pumping through your arteries is too strong. If this condition is not controlled, it may put you at risk for serious complications.  Your personal target blood pressure may vary depending on your medical conditions, your age, and other factors. For most people, a normal blood pressure is less than 120/80.  Hypertension is treated with lifestyle changes, medicines, or a combination of both. Lifestyle  changes include losing weight, eating a healthy, low-sodium diet, exercising more, and limiting alcohol. This information is not intended to replace advice given to you by your health care provider. Make sure you discuss any questions you have with your health care provider. Document Released: 09/21/2005 Document Revised: 06/01/2018 Document Reviewed: 06/01/2018 Elsevier Patient Education  2020 [employer].

## 2019-09-21 NOTE — Assessment & Plan Note (Signed)
Encouraged heart healthy diet, increase exercise, avoid trans fats, consider a krill oil cap daily 

## 2019-09-21 NOTE — Assessment & Plan Note (Signed)
Monitor. asymptomatic

## 2019-09-22 LAB — URINE CULTURE
MICRO NUMBER:: 1208224
SPECIMEN QUALITY:: ADEQUATE

## 2019-09-22 LAB — PTH, INTACT AND CALCIUM
Calcium: 9.8 mg/dL (ref 8.6–10.4)
PTH: 59 pg/mL (ref 14–64)

## 2019-09-24 NOTE — Progress Notes (Signed)
Subjective:    Patient ID: Amber Morgan, female    DOB: [birth year], 85 y.o.   MRN: [id number]  Chief Complaint  Patient presents with  . Follow-up    HPI Patient is in today for follow up on chronic medical conccners including hypertension, hyperglycemia, abdominal pain and more. No recent febrile illness or hospitalizations. The abdominal pain has improved but not resolved. No change in appetite or bowel habits. Denies CP/palp/SOB/HA/congestion/fevers or GU c/o. Taking meds as prescribed  Past Medical History:  Diagnosis Date  . Allergy   . Arthritis   . Cancer [facility])    breast cancer   . Colon polyps   . Diverticulitis   . Diverticulosis 01/04/2014  . Glaucoma   . Hernia, inguinal, right   . History of chicken pox    childhood  . History of hiatal hernia   . HTN (hypertension)   . Hyperglycemia 09/05/2013  . Lipoma of arm 12/09/2014   right  . Nocturia 02/08/2013  . Osteopenia 12/09/2014  . Personal history of colonic polyps 09/05/2013  . Pre-diabetes   . Preventative health care 08/14/2016  . Unspecified constipation 02/06/2013  . Urinary incontinence   . Viral infection characterized by skin and mucous membrane lesions 05/11/2013  . Wears glasses     Past Surgical History:  Procedure Laterality Date  . ABDOMINAL HYSTERECTOMY    . APPENDECTOMY    . BLADDER SURGERY  2003   bladder tact  . BREAST LUMPECTOMY Right   . BREAST LUMPECTOMY WITH RADIOACTIVE SEED LOCALIZATION Right 04/12/2018   Procedure: BREAST LUMPECTOMY WITH RADIOACTIVE SEED LOCALIZATION;  Surgeon: [name], MD;  Location: [facility];  Service: General;  Laterality: Right;  . CATARACT EXTRACTION, BILATERAL    . COLONOSCOPY W/ BIOPSIES AND POLYPECTOMY    . EYE SURGERY     multiple bilateral  . INGUINAL HERNIA REPAIR Right 04/12/2018   Procedure: OPEN RIGHT INGUINAL HERNIA REPAIR WITH MESH;  Surgeon: [name], MD;  Location: [facility];  Service: General;  Laterality: Right;  . RECTOCELE REPAIR Bilateral      Family History  Problem Relation Age of Onset  . Colon cancer Mother   . Hypertension Mother   . Heart disease Father   . Meniere's disease Father   . Heart disease Sister   . Arthritis Son        back  . Glaucoma Son   . Hypertension Son   . Cataracts Son   . Colon cancer Sister   . Alzheimer's disease Sister   . Glaucoma Sister   . Glaucoma Sister   . Hyperlipidemia Sister   . Hypertension Sister   . Cancer Sister        thyroid  . Diabetes Sister   . Glaucoma Sister     Social History   Socioeconomic History  . Marital status: Married    Spouse name: Not on file  . Number of children: 4  . Years of education: Not on file  . Highest education level: Not on file  Occupational History  . Not on file  Tobacco Use  . Smoking status: Never Smoker  . Smokeless tobacco: Never Used  Substance and Sexual Activity  . Alcohol use: No  . Drug use: No  . Sexual activity: Not Currently  Other Topics Concern  . Not on file  Social History Narrative  . Not on file   Social Determinants of Health   Financial Resource Strain:   . Difficulty of Paying  Living Expenses: Not on file  Food Insecurity:   . Worried About Charity fundraiser in the Last Year: Not on file  . Ran Out of Food in the Last Year: Not on file  Transportation Needs:   . Lack of Transportation (Medical): Not on file  . Lack of Transportation (Non-Medical): Not on file  Physical Activity:   . Days of Exercise per Week: Not on file  . Minutes of Exercise per Session: Not on file  Stress:   . Feeling of Stress : Not on file  Social Connections:   . Frequency of Communication with Friends and Family: Not on file  . Frequency of Social Gatherings with Friends and Family: Not on file  . Attends Religious Services: Not on file  . Active Member of Clubs or Organizations: Not on file  . Attends Archivist Meetings: Not on file  . Marital Status: Not on file  Intimate Partner Violence:   .  Fear of Current or Ex-Partner: Not on file  . Emotionally Abused: Not on file  . Physically Abused: Not on file  . Sexually Abused: Not on file    Outpatient Medications Prior to Visit  Medication Sig Dispense Refill  . Artificial Tear Solution (GENTEAL TEARS OP) Place 1 drop into both eyes 2 (two) times daily.    . cholecalciferol (VITAMIN D) 1000 units tablet Take 1 tablet (1,000 Units total) by mouth daily.    . Fiber POWD Take 1 Scoop by mouth daily.    Marland Kitchen linaclotide (LINZESS) 145 MCG CAPS capsule Take 1 capsule (145 mcg total) by mouth daily before breakfast. 30 capsule 2  . losartan (COZAAR) 25 MG tablet Take 1 tablet (25 mg total) by mouth daily. 90 tablet 0  . Probiotic Product (PROBIOTIC DAILY PO) Take 1 capsule by mouth daily.     . sodium chloride (MURO 128) 2 % ophthalmic solution Place 1 drop into both eyes.     No facility-administered medications prior to visit.    Allergies  Allergen Reactions  . Amlodipine Other (See Comments)    Mental status chnages ie confusion  . Doxycycline Rash    Review of Systems  Constitutional: Positive for malaise/fatigue. Negative for fever.  HENT: Negative for congestion.   Eyes: Negative for blurred vision.  Respiratory: Negative for shortness of breath.   Cardiovascular: Negative for chest pain, palpitations and leg swelling.  Gastrointestinal: Positive for abdominal pain. Negative for blood in stool and nausea.  Genitourinary: Negative for dysuria and frequency.  Musculoskeletal: Negative for falls.  Skin: Negative for rash.  Neurological: Negative for dizziness, loss of consciousness and headaches.  Endo/Heme/Allergies: Negative for environmental allergies.  Psychiatric/Behavioral: Negative for depression. The patient is not nervous/anxious.        Objective:    Physical Exam Vitals and nursing note reviewed.  Constitutional:      General: She is not in acute distress.    Appearance: She is well-developed.  HENT:      Head: Normocephalic and atraumatic.     Nose: Nose normal.  Eyes:     General:        Right eye: No discharge.        Left eye: No discharge.  Cardiovascular:     Rate and Rhythm: Normal rate and regular rhythm.  Pulmonary:     Effort: Pulmonary effort is normal.     Breath sounds: Normal breath sounds.  Abdominal:     General: Bowel sounds are normal.  Palpations: Abdomen is soft.     Tenderness: There is no abdominal tenderness.  Musculoskeletal:     Cervical back: Normal range of motion and neck supple.  Skin:    General: Skin is warm and dry.  Neurological:     Mental Status: She is alert and oriented to person, place, and time.     BP (!) 146/62   Pulse 70   Temp (!) 97 F (36.1 C) (Skin)   Ht 4\' 9"  (1.448 m)   Wt 124 lb 9.6 oz (56.5 kg)   SpO2 98%   BMI 26.96 kg/m  Wt Readings from Last 3 Encounters:  09/21/19 124 lb 9.6 oz (56.5 kg)  12/05/18 123 lb 9.6 oz (56.1 kg)  11/14/18 115 lb 1.6 oz (52.2 kg)    Diabetic Foot Exam - Simple   No data filed     Lab Results  Component Value Date   WBC 6.4 09/21/2019   HGB 12.3 09/21/2019   HCT 37.1 09/21/2019   PLT 223.0 09/21/2019   GLUCOSE 106 (H) 09/21/2019   CHOL 162 09/21/2019   TRIG 82.0 09/21/2019   HDL 56.00 09/21/2019   LDLCALC 89 09/21/2019   ALT 15 09/21/2019   Morgan 16 09/21/2019   NA 140 09/21/2019   K 4.6 09/21/2019   CL 106 09/21/2019   CREATININE 0.98 09/21/2019   BUN 33 (H) 09/21/2019   CO2 31 09/21/2019   TSH 2.29 09/21/2019   HGBA1C 6.1 09/21/2019   MICROALBUR 0.8 08/06/2016    Lab Results  Component Value Date   TSH 2.29 09/21/2019   Lab Results  Component Value Date   WBC 6.4 09/21/2019   HGB 12.3 09/21/2019   HCT 37.1 09/21/2019   MCV 96.2 09/21/2019   PLT 223.0 09/21/2019   Lab Results  Component Value Date   NA 140 09/21/2019   K 4.6 09/21/2019   CO2 31 09/21/2019   GLUCOSE 106 (H) 09/21/2019   BUN 33 (H) 09/21/2019   CREATININE 0.98 09/21/2019   BILITOT 0.4  09/21/2019   ALKPHOS 52 09/21/2019   Morgan 16 09/21/2019   ALT 15 09/21/2019   PROT 6.4 09/21/2019   ALBUMIN 4.0 09/21/2019   CALCIUM 9.7 09/21/2019   CALCIUM 9.8 09/21/2019   ANIONGAP 7 03/30/2018   GFR 53.29 (L) 09/21/2019   Lab Results  Component Value Date   CHOL 162 09/21/2019   Lab Results  Component Value Date   HDL 56.00 09/21/2019   Lab Results  Component Value Date   LDLCALC 89 09/21/2019   Lab Results  Component Value Date   TRIG 82.0 09/21/2019   Lab Results  Component Value Date   CHOLHDL 3 09/21/2019   Lab Results  Component Value Date   HGBA1C 6.1 09/21/2019       Assessment & Plan:   Problem List Items Addressed This Visit    HTN (hypertension)    Notes 120s and 130s at rest most days. Up with activity to the 140s.  no changes to meds. Encouraged heart healthy diet such as the DASH diet and exercise as tolerated.       Relevant Orders   CBC (Completed)   CMP (Completed)   TSH (Completed)   Hyperlipidemia    Encouraged heart healthy diet, increase exercise, avoid trans fats, consider a krill oil cap daily      Relevant Orders   Lipid panel (Completed)   Hyperglycemia    hgba1c acceptable, minimize simple carbs. Increase exercise as  tolerated.      Relevant Orders   A1C (Completed)   Hyponatremia    Monitor. asymptomatic      Abdominal pain    Improved some, no changes in bowel habits or appetite. Report any worsening concerns.        Other Visit Diagnoses    Hypercalcemia    -  Primary   Relevant Orders   VITAMIN D (Completed)   PTH, Intact and Calcium (Completed)   Urinary incontinence, unspecified type       Relevant Orders   Urinalysis (LAB COLLECT) (Completed)   Urine Culture (Completed)   Candida infection       Relevant Medications   nystatin cream (MYCOSTATIN)      I am having Amber Morgan start on nystatin cream. I am also having her maintain her Probiotic Product (PROBIOTIC DAILY PO), Artificial Tear Solution  (GENTEAL TEARS OP), Fiber, cholecalciferol, linaclotide, sodium chloride, and losartan.  Meds ordered this encounter  Medications  . nystatin cream (MYCOSTATIN)    Sig: Apply 1 application topically 2 (two) times daily as needed for dry skin.    Dispense:  30 g    Refill:  01     Penni Homans, MD

## 2019-09-24 NOTE — Assessment & Plan Note (Signed)
Improved some, no changes in bowel habits or appetite. Report any worsening concerns.

## 2019-09-27 ENCOUNTER — Telehealth: Payer: Self-pay | Admitting: Family Medicine

## 2019-09-27 NOTE — Telephone Encounter (Signed)
Patient would like to know if there is anything in her blood work that shows that she should not be able to safely get the covid-19 vaccination from the facility she resides in.

## 2019-09-28 NOTE — Telephone Encounter (Signed)
Patient notified

## 2019-09-28 NOTE — Telephone Encounter (Signed)
Nope she is good to take the vaccine I believe.

## 2019-09-28 NOTE — Telephone Encounter (Signed)
Please advise 

## 2019-10-13 ENCOUNTER — Telehealth: Payer: Self-pay | Admitting: *Deleted

## 2019-10-13 NOTE — Telephone Encounter (Signed)
Results mailed.  Copied from [facility] [phone]. Topic: General - Other >> Oct 12, 2019  1:45 PM Antonieta Iba C wrote: Reason for CRM: pt called in requesting to have her most recent lab work mailed out to her at the address on file.    Please assist.

## 2019-10-16 DIAGNOSIS — Z1383 Encounter for screening for respiratory disorder NEC: Secondary | ICD-10-CM | POA: Diagnosis not present

## 2019-10-16 DIAGNOSIS — Z20828 Contact with and (suspected) exposure to other viral communicable diseases: Secondary | ICD-10-CM | POA: Diagnosis not present

## 2019-11-03 DIAGNOSIS — H401113 Primary open-angle glaucoma, right eye, severe stage: Secondary | ICD-10-CM | POA: Diagnosis not present

## 2019-11-03 DIAGNOSIS — Z961 Presence of intraocular lens: Secondary | ICD-10-CM | POA: Diagnosis not present

## 2019-11-03 DIAGNOSIS — H44522 Atrophy of globe, left eye: Secondary | ICD-10-CM | POA: Diagnosis not present

## 2019-11-03 DIAGNOSIS — H18422 Band keratopathy, left eye: Secondary | ICD-10-CM | POA: Diagnosis not present

## 2019-11-03 DIAGNOSIS — H35351 Cystoid macular degeneration, right eye: Secondary | ICD-10-CM | POA: Diagnosis not present

## 2019-11-10 DIAGNOSIS — H35361 Drusen (degenerative) of macula, right eye: Secondary | ICD-10-CM | POA: Diagnosis not present

## 2019-11-10 DIAGNOSIS — H43811 Vitreous degeneration, right eye: Secondary | ICD-10-CM | POA: Diagnosis not present

## 2019-11-10 DIAGNOSIS — H35371 Puckering of macula, right eye: Secondary | ICD-10-CM | POA: Diagnosis not present

## 2019-11-10 DIAGNOSIS — H35351 Cystoid macular degeneration, right eye: Secondary | ICD-10-CM | POA: Diagnosis not present

## 2019-12-08 DIAGNOSIS — H35361 Drusen (degenerative) of macula, right eye: Secondary | ICD-10-CM | POA: Diagnosis not present

## 2019-12-08 DIAGNOSIS — H43811 Vitreous degeneration, right eye: Secondary | ICD-10-CM | POA: Diagnosis not present

## 2019-12-08 DIAGNOSIS — H35351 Cystoid macular degeneration, right eye: Secondary | ICD-10-CM | POA: Diagnosis not present

## 2019-12-08 DIAGNOSIS — H35371 Puckering of macula, right eye: Secondary | ICD-10-CM | POA: Diagnosis not present

## 2020-01-19 DIAGNOSIS — H35351 Cystoid macular degeneration, right eye: Secondary | ICD-10-CM | POA: Diagnosis not present

## 2020-01-19 DIAGNOSIS — H35361 Drusen (degenerative) of macula, right eye: Secondary | ICD-10-CM | POA: Diagnosis not present

## 2020-01-19 DIAGNOSIS — H43811 Vitreous degeneration, right eye: Secondary | ICD-10-CM | POA: Diagnosis not present

## 2020-01-19 DIAGNOSIS — H35371 Puckering of macula, right eye: Secondary | ICD-10-CM | POA: Diagnosis not present

## 2020-01-22 ENCOUNTER — Ambulatory Visit (INDEPENDENT_AMBULATORY_CARE_PROVIDER_SITE_OTHER): Payer: Medicare HMO | Admitting: Family Medicine

## 2020-01-22 ENCOUNTER — Other Ambulatory Visit: Payer: Self-pay

## 2020-01-22 VITALS — BP 138/68 | HR 81 | Temp 98.5°F | Resp 12 | Ht <= 58 in | Wt 126.4 lb

## 2020-01-22 DIAGNOSIS — R739 Hyperglycemia, unspecified: Secondary | ICD-10-CM

## 2020-01-22 DIAGNOSIS — I10 Essential (primary) hypertension: Secondary | ICD-10-CM

## 2020-01-22 DIAGNOSIS — M25561 Pain in right knee: Secondary | ICD-10-CM | POA: Diagnosis not present

## 2020-01-22 DIAGNOSIS — E785 Hyperlipidemia, unspecified: Secondary | ICD-10-CM

## 2020-01-22 LAB — HEMOGLOBIN A1C: Hgb A1c MFr Bld: 6.1 % (ref 4.6–6.5)

## 2020-01-22 LAB — COMPREHENSIVE METABOLIC PANEL
ALT: 16 U/L (ref 0–35)
AST: 16 U/L (ref 0–37)
Albumin: 4 g/dL (ref 3.5–5.2)
Alkaline Phosphatase: 51 U/L (ref 39–117)
BUN: 27 mg/dL — ABNORMAL HIGH (ref 6–23)
CO2: 30 mEq/L (ref 19–32)
Calcium: 9.9 mg/dL (ref 8.4–10.5)
Chloride: 103 mEq/L (ref 96–112)
Creatinine, Ser: 0.88 mg/dL (ref 0.40–1.20)
GFR: 60.29 mL/min (ref >60.00)
Glucose, Bld: 98 mg/dL (ref 70–99)
Potassium: 4 mEq/L (ref 3.5–5.1)
Sodium: 138 mEq/L (ref 135–145)
Total Bilirubin: 0.4 mg/dL (ref 0.2–1.2)
Total Protein: 6.1 g/dL (ref 6.0–8.3)

## 2020-01-22 LAB — CBC
HCT: 36.9 % (ref 36.0–46.0)
Hemoglobin: 12.4 g/dL (ref 12.0–15.0)
MCHC: 33.5 g/dL (ref 30.0–36.0)
MCV: 95.8 fl (ref 78.0–100.0)
Platelets: 213 10*3/uL (ref 150.0–400.0)
RBC: 3.86 Mil/uL — ABNORMAL LOW (ref 3.87–5.11)
RDW: 12.9 % (ref 11.5–15.5)
WBC: 6.6 10*3/uL (ref 4.0–10.5)

## 2020-01-22 LAB — LIPID PANEL
Cholesterol: 152 mg/dL (ref 0–200)
HDL: 58.1 mg/dL (ref >39.00)
LDL Cholesterol: 74 mg/dL (ref 0–99)
NonHDL: 94.32
Total CHOL/HDL Ratio: 3
Triglycerides: 104 mg/dL (ref 0.0–149.0)
VLDL: 20.8 mg/dL (ref 0.0–40.0)

## 2020-01-22 LAB — TSH: TSH: 2.67 u[IU]/mL (ref 0.35–4.50)

## 2020-01-22 NOTE — Patient Instructions (Signed)
Baker Cyst ° °A Baker cyst, also called a popliteal cyst, is a growth that forms at the back of the knee. The cyst forms when the fluid-filled sac (bursa) that cushions the knee joint becomes enlarged. °What are the causes? °In most cases, a Baker cyst results from another knee problem that causes swelling inside the knee. This makes the fluid inside the knee joint (synovial fluid) flow into the bursa behind the knee, causing the bursa to enlarge. °What increases the risk? °You may be more likely to develop a Baker cyst if you already have a knee problem, such as: °· A tear in cartilage that cushions the knee joint (meniscal tear). °· A tear in the tissues that connect the bones of the knee joint (ligament tear). °· Knee swelling from osteoarthritis, rheumatoid arthritis, or gout. °What are the signs or symptoms? °The main symptom of this condition is a lump behind the knee. This may be the only symptom of the condition. The lump may be painful, especially when the knee is straightened. If the lump is painful, the pain may come and go. The knee may also be stiff. °Symptoms may quickly get more severe if the cyst breaks open (ruptures). If the cyst ruptures, you may feel the following in your knee and calf: °· Sudden or worsening pain. °· Swelling. °· Bruising. °· Redness in the calf. °A Baker cyst does not always cause symptoms. °How is this diagnosed? °This condition may be diagnosed based on your symptoms and medical history. Your health care provider will also do a physical exam. This may include: °· Feeling the cyst to check whether it is tender. °· Checking your knee for signs of another knee condition that causes swelling. °You may have imaging tests, such as: °· X-rays. °· MRI. °· Ultrasound. °How is this treated? °A Baker cyst that is not painful may go away without treatment. If the cyst gets large or painful, it will likely get better if the underlying knee problem is treated. °If needed, treatment for a  Baker cyst may include: °· Resting. °· Keeping weight off of the knee. This means not leaning on the knee to support your body weight. °· Taking NSAIDs, such as ibuprofen, to reduce pain and swelling. °· Having a procedure to drain the fluid from the cyst with a needle (aspiration). You may also get an injection of a medicine that reduces swelling (steroid). °· Having surgery. This may be needed if other treatments do not work. This usually involves correcting knee damage and removing the cyst. °Follow these instructions at home: ° °Activity °· Rest as told by your health care provider. °· Avoid activities that make pain or swelling worse. °· Return to your normal activities as told by your health care provider. Ask your health care provider what activities are safe for you. °· Do not use the injured limb to support your body weight until your health care provider says that you can. Use crutches as told by your health care provider. °General instructions °· Take over-the-counter and prescription medicines only as told by your health care provider. °· Keep all follow-up visits as told by your health care provider. This is important. °Contact a health care provider if: °· You have knee pain, stiffness, or swelling that does not get better. °Get help right away if: °· You have sudden or worsening pain and swelling in your calf area. °Summary °· A Baker cyst, also called a popliteal cyst, is a growth that forms at the   back of the knee. °· In most cases, a Baker cyst results from another knee problem that causes swelling inside the knee. °· A Baker cyst that is not painful may go away without treatment. °· If needed, treatment for a Baker cyst may include resting, keeping weight off of the knee, medicines, or draining fluid from the cyst. °· Surgery may be needed if other treatments are not effective. °This information is not intended to replace advice given to you by your health care provider. Make sure you discuss any  questions you have with your health care provider. °Document Revised: 02/03/2019 Document Reviewed: 02/03/2019 °Elsevier Patient Education © 2020 [employer] ° ° ° °

## 2020-01-22 NOTE — Assessment & Plan Note (Signed)
Encouraged heart healthy diet, increase exercise, avoid trans fats, consider a krill oil cap daily 

## 2020-01-22 NOTE — Assessment & Plan Note (Signed)
hgba1c acceptable, minimize simple carbs. Increase exercise as tolerated.  

## 2020-01-22 NOTE — Progress Notes (Signed)
Subjective:    Patient ID: Amber Morgan, female    DOB: 19-Jan-1927, 86 y.o.   MRN: [id number]  Chief Complaint  Patient presents with  . 4 month follow up    HPI Patient is in today for follow up on chronic medical concerns. Her only notable concern is right knee posterior swelling and intermittent discomfort. It is not obstrcting her ability to get her ADLs done. She is otherwise well. No recent febrile illness or hospitalizations. Denies CP/palp/SOB/HA/congestion/fevers/GI or GU c/o. Taking meds as prescribed  Past Medical History:  Diagnosis Date  . Allergy   . Arthritis   . Cancer [facility])    breast cancer   . Colon polyps   . Diverticulitis   . Diverticulosis 01/04/2014  . Glaucoma   . Hernia, inguinal, right   . History of chicken pox    childhood  . History of hiatal hernia   . HTN (hypertension)   . Hyperglycemia 09/05/2013  . Lipoma of arm 12/09/2014   right  . Nocturia 02/08/2013  . Osteopenia 12/09/2014  . Personal history of colonic polyps 09/05/2013  . Pre-diabetes   . Preventative health care 08/14/2016  . Unspecified constipation 02/06/2013  . Urinary incontinence   . Viral infection characterized by skin and mucous membrane lesions 05/11/2013  . Wears glasses     Past Surgical History:  Procedure Laterality Date  . ABDOMINAL HYSTERECTOMY    . APPENDECTOMY    . BLADDER SURGERY  2003   bladder tact  . BREAST LUMPECTOMY Right   . BREAST LUMPECTOMY WITH RADIOACTIVE SEED LOCALIZATION Right 04/12/2018   Procedure: BREAST LUMPECTOMY WITH RADIOACTIVE SEED LOCALIZATION;  Surgeon: [name], MD;  Location: [place];  Service: General;  Laterality: Right;  . CATARACT EXTRACTION, BILATERAL    . COLONOSCOPY W/ BIOPSIES AND POLYPECTOMY    . EYE SURGERY     multiple bilateral  . INGUINAL HERNIA REPAIR Right 04/12/2018   Procedure: OPEN RIGHT INGUINAL HERNIA REPAIR WITH MESH;  Surgeon: [name], MD;  Location: [place];  Service: General;  Laterality: Right;  . RECTOCELE  REPAIR Bilateral     Family History  Problem Relation Age of Onset  . Colon cancer Mother   . Hypertension Mother   . Heart disease Father   . Meniere's disease Father   . Heart disease Sister   . Arthritis Son        back  . Glaucoma Son   . Hypertension Son   . Cataracts Son   . Colon cancer Sister   . Alzheimer's disease Sister   . Glaucoma Sister   . Glaucoma Sister   . Hyperlipidemia Sister   . Hypertension Sister   . Cancer Sister        thyroid  . Diabetes Sister   . Glaucoma Sister     Social History   Socioeconomic History  . Marital status: Married    Spouse name: Not on file  . Number of children: 4  . Years of education: Not on file  . Highest education level: Not on file  Occupational History  . Not on file  Tobacco Use  . Smoking status: Never Smoker  . Smokeless tobacco: Never Used  Substance and Sexual Activity  . Alcohol use: No  . Drug use: No  . Sexual activity: Not Currently  Other Topics Concern  . Not on file  Social History Narrative  . Not on file   Social Determinants of Health   Financial  Resource Strain:   . Difficulty of Paying Living Expenses:   Food Insecurity:   . Worried About Charity fundraiser in the Last Year:   . Arboriculturist in the Last Year:   Transportation Needs:   . Film/video editor (Medical):   Marland Kitchen Lack of Transportation (Non-Medical):   Physical Activity:   . Days of Exercise per Week:   . Minutes of Exercise per Session:   Stress:   . Feeling of Stress :   Social Connections:   . Frequency of Communication with Friends and Family:   . Frequency of Social Gatherings with Friends and Family:   . Attends Religious Services:   . Active Member of Clubs or Organizations:   . Attends Archivist Meetings:   Marland Kitchen Marital Status:   Intimate Partner Violence:   . Fear of Current or Ex-Partner:   . Emotionally Abused:   Marland Kitchen Physically Abused:   . Sexually Abused:     Outpatient Medications Prior  to Visit  Medication Sig Dispense Refill  . Artificial Tear Solution (GENTEAL TEARS OP) Place 1 drop into both eyes 2 (two) times daily.    Marland Kitchen aspirin 81 MG EC tablet Take 81 mg by mouth daily. Swallow whole.    . cholecalciferol (VITAMIN D) 1000 units tablet Take 1 tablet (1,000 Units total) by mouth daily.    . Difluprednate (DUREZOL OP) Place 1 drop into the right eye daily.    . Fiber POWD Take 1 Scoop by mouth daily.    . Multiple Vitamins-Calcium (ONE-A-DAY WITHIN PO) Take 1 tablet by mouth daily.    Vladimir Faster Glycol-Propyl Glycol (SYSTANE OP) Apply to eye.    Vladimir Faster Glycol-Propyl Glycol (SYSTANE) 0.4-0.3 % GEL ophthalmic gel Place 1 application into both eyes at bedtime.    . Probiotic Product (PROBIOTIC DAILY PO) Take 1 capsule by mouth daily.     Marland Kitchen linaclotide (LINZESS) 145 MCG CAPS capsule Take 1 capsule (145 mcg total) by mouth daily before breakfast. 30 capsule 2  . losartan (COZAAR) 25 MG tablet Take 1 tablet (25 mg total) by mouth daily. 90 tablet 0  . nystatin cream (MYCOSTATIN) Apply 1 application topically 2 (two) times daily as needed for dry skin. 30 g 01  . sodium chloride (MURO 128) 2 % ophthalmic solution Place 1 drop into both eyes.     No facility-administered medications prior to visit.    Allergies  Allergen Reactions  . Amlodipine Other (See Comments)    Mental status chnages ie confusion  . Doxycycline Rash    Review of Systems  Constitutional: Negative for fever and malaise/fatigue.  HENT: Negative for congestion.   Eyes: Negative for blurred vision.  Respiratory: Negative for shortness of breath.   Cardiovascular: Negative for chest pain, palpitations and leg swelling.  Gastrointestinal: Negative for abdominal pain, blood in stool and nausea.  Genitourinary: Negative for dysuria and frequency.  Musculoskeletal: Negative for falls.  Skin: Negative for rash.  Neurological: Negative for dizziness, loss of consciousness and headaches.   Endo/Heme/Allergies: Negative for environmental allergies.  Psychiatric/Behavioral: Negative for depression. The patient is not nervous/anxious.        Objective:    Physical Exam Vitals and nursing note reviewed.  Constitutional:      General: She is not in acute distress.    Appearance: She is well-developed.  HENT:     Head: Normocephalic and atraumatic.     Nose: Nose normal.  Eyes:  General:        Right eye: No discharge.        Left eye: No discharge.  Cardiovascular:     Rate and Rhythm: Normal rate and regular rhythm.     Heart sounds: No murmur.  Pulmonary:     Effort: Pulmonary effort is normal.     Breath sounds: Normal breath sounds.  Abdominal:     General: Bowel sounds are normal.     Palpations: Abdomen is soft.     Tenderness: There is no abdominal tenderness.  Musculoskeletal:        General: Swelling present.     Cervical back: Normal range of motion and neck supple.     Comments: Slight swelling noted right posterior knee  Skin:    General: Skin is warm and dry.  Neurological:     Mental Status: She is alert and oriented to person, place, and time.     BP 138/68 (BP Location: Right Arm, Cuff Size: Normal)   Pulse 81   Temp 98.5 F (36.9 C) (Temporal)   Resp 12   Ht 4\' 9"  (1.448 m)   Wt 126 lb 6.4 oz (57.3 kg)   SpO2 98%   BMI 27.35 kg/m  Wt Readings from Last 3 Encounters:  01/22/20 126 lb 6.4 oz (57.3 kg)  09/21/19 124 lb 9.6 oz (56.5 kg)  12/05/18 123 lb 9.6 oz (56.1 kg)    Diabetic Foot Exam - Simple   No data filed     Lab Results  Component Value Date   WBC 6.6 01/22/2020   HGB 12.4 01/22/2020   HCT 36.9 01/22/2020   PLT 213.0 01/22/2020   GLUCOSE 98 01/22/2020   CHOL 152 01/22/2020   TRIG 104.0 01/22/2020   HDL 58.10 01/22/2020   LDLCALC 74 01/22/2020   ALT 16 01/22/2020   Morgan 16 01/22/2020   NA 138 01/22/2020   K 4.0 01/22/2020   CL 103 01/22/2020   CREATININE 0.88 01/22/2020   BUN 27 (H) 01/22/2020   CO2 30  01/22/2020   TSH 2.67 01/22/2020   HGBA1C 6.1 01/22/2020   MICROALBUR 0.8 08/06/2016    Lab Results  Component Value Date   TSH 2.67 01/22/2020   Lab Results  Component Value Date   WBC 6.6 01/22/2020   HGB 12.4 01/22/2020   HCT 36.9 01/22/2020   MCV 95.8 01/22/2020   PLT 213.0 01/22/2020   Lab Results  Component Value Date   NA 138 01/22/2020   K 4.0 01/22/2020   CO2 30 01/22/2020   GLUCOSE 98 01/22/2020   BUN 27 (H) 01/22/2020   CREATININE 0.88 01/22/2020   BILITOT 0.4 01/22/2020   ALKPHOS 51 01/22/2020   Morgan 16 01/22/2020   ALT 16 01/22/2020   PROT 6.1 01/22/2020   ALBUMIN 4.0 01/22/2020   CALCIUM 9.9 01/22/2020   ANIONGAP 7 03/30/2018   GFR 60.29 01/22/2020   Lab Results  Component Value Date   CHOL 152 01/22/2020   Lab Results  Component Value Date   HDL 58.10 01/22/2020   Lab Results  Component Value Date   LDLCALC 74 01/22/2020   Lab Results  Component Value Date   TRIG 104.0 01/22/2020   Lab Results  Component Value Date   CHOLHDL 3 01/22/2020   Lab Results  Component Value Date   HGBA1C 6.1 01/22/2020       Assessment & Plan:   Problem List Items Addressed This Visit    HTN (hypertension)  Well controlled, no changes to meds. Encouraged heart healthy diet such as the DASH diet and exercise as tolerated.       Relevant Medications   aspirin 81 MG EC tablet   Other Relevant Orders   CBC (Completed)   Comprehensive metabolic panel (Completed)   TSH (Completed)   Hyperlipidemia    Encouraged heart healthy diet, increase exercise, avoid trans fats, consider a krill oil cap daily      Relevant Medications   aspirin 81 MG EC tablet   Other Relevant Orders   Lipid panel (Completed)   Hyperglycemia    hgba1c acceptable, minimize simple carbs. Increase exercise as tolerated.       Relevant Orders   Hemoglobin A1c (Completed)   Right knee pain - Primary    Encouraged moist heat and gentle stretching as tolerated. May try NSAIDs  and prescription meds as directed and report if symptoms worsen or seek immediate care. Most pain and swelling noted in posterior knee, no warmth or heat or redness. No trauma consider Baker's Cyst. Proceed with ultrasound.       Relevant Orders   US Venous Img Lower Unilateral Right (DVT)      I have discontinued Uniqua M. Rubano's linaclotide, sodium chloride, losartan, and nystatin cream. I am also having her maintain her Probiotic Product (PROBIOTIC DAILY PO), Artificial Tear Solution (GENTEAL TEARS OP), Fiber, cholecalciferol, Polyethyl Glycol-Propyl Glycol (SYSTANE OP), Systane, Difluprednate (DUREZOL OP), Multiple Vitamins-Calcium (ONE-A-DAY WITHIN PO), and aspirin.  No orders of the defined types were placed in this encounter.    Penni Homans, MD

## 2020-01-22 NOTE — Assessment & Plan Note (Signed)
Well controlled, no changes to meds. Encouraged heart healthy diet such as the DASH diet and exercise as tolerated.  °

## 2020-01-22 NOTE — Assessment & Plan Note (Signed)
Encouraged moist heat and gentle stretching as tolerated. May try NSAIDs and prescription meds as directed and report if symptoms worsen or seek immediate care. Most pain and swelling noted in posterior knee, no warmth or heat or redness. No trauma consider Baker's Cyst. Proceed with ultrasound.

## 2020-01-26 ENCOUNTER — Other Ambulatory Visit: Payer: Self-pay

## 2020-01-26 ENCOUNTER — Ambulatory Visit (HOSPITAL_BASED_OUTPATIENT_CLINIC_OR_DEPARTMENT_OTHER)
Admission: RE | Admit: 2020-01-26 | Discharge: 2020-01-26 | Disposition: A | Discharge status: Home / Self Care | Payer: Medicare HMO | Source: Ambulatory Visit | Attending: Family Medicine | Admitting: Family Medicine

## 2020-01-26 DIAGNOSIS — M25561 Pain in right knee: Secondary | ICD-10-CM | POA: Insufficient documentation

## 2020-01-26 DIAGNOSIS — M7989 Other specified soft tissue disorders: Secondary | ICD-10-CM | POA: Diagnosis not present

## 2020-01-30 ENCOUNTER — Telehealth: Payer: Self-pay | Admitting: Family Medicine

## 2020-01-30 ENCOUNTER — Other Ambulatory Visit: Payer: Self-pay | Admitting: *Deleted

## 2020-01-30 DIAGNOSIS — M25561 Pain in right knee: Secondary | ICD-10-CM

## 2020-01-30 NOTE — Telephone Encounter (Signed)
Patient call back stating that she no longer needed a referral for orthopedic dr. Patient states that she make an appointment with a [facility] and they take her insurance.

## 2020-01-30 NOTE — Telephone Encounter (Signed)
Patient is requesting to go to [facility] if they accept her [employer] . Patient told to call [facility] orthopedic first to ask about insurance.      [address], [place], [place] [address]  ~3.4 mi [phone]

## 2020-01-30 NOTE — Telephone Encounter (Signed)
I have cancelled the referral.

## 2020-02-02 DIAGNOSIS — M25561 Pain in right knee: Secondary | ICD-10-CM | POA: Diagnosis not present

## 2020-02-09 DIAGNOSIS — H18422 Band keratopathy, left eye: Secondary | ICD-10-CM | POA: Diagnosis not present

## 2020-02-09 DIAGNOSIS — H44522 Atrophy of globe, left eye: Secondary | ICD-10-CM | POA: Diagnosis not present

## 2020-02-09 DIAGNOSIS — H401113 Primary open-angle glaucoma, right eye, severe stage: Secondary | ICD-10-CM | POA: Diagnosis not present

## 2020-02-09 DIAGNOSIS — H35351 Cystoid macular degeneration, right eye: Secondary | ICD-10-CM | POA: Diagnosis not present

## 2020-02-09 DIAGNOSIS — Z961 Presence of intraocular lens: Secondary | ICD-10-CM | POA: Diagnosis not present

## 2020-02-13 ENCOUNTER — Other Ambulatory Visit: Payer: Self-pay | Admitting: Adult Health

## 2020-02-13 DIAGNOSIS — Z853 Personal history of malignant neoplasm of breast: Secondary | ICD-10-CM

## 2020-03-06 ENCOUNTER — Ambulatory Visit (INDEPENDENT_AMBULATORY_CARE_PROVIDER_SITE_OTHER): Payer: Medicare HMO | Admitting: Family Medicine

## 2020-03-06 ENCOUNTER — Encounter: Payer: Self-pay | Admitting: Family Medicine

## 2020-03-06 ENCOUNTER — Other Ambulatory Visit: Payer: Self-pay

## 2020-03-06 VITALS — BP 162/68 | HR 102 | Temp 96.0°F | Ht 59.5 in | Wt 130.0 lb

## 2020-03-06 DIAGNOSIS — R350 Frequency of micturition: Secondary | ICD-10-CM | POA: Diagnosis not present

## 2020-03-06 DIAGNOSIS — Q649 Congenital malformation of urinary system, unspecified: Secondary | ICD-10-CM | POA: Diagnosis not present

## 2020-03-06 DIAGNOSIS — R300 Dysuria: Secondary | ICD-10-CM | POA: Diagnosis not present

## 2020-03-06 LAB — POC URINALSYSI DIPSTICK (AUTOMATED)
Bilirubin, UA: NEGATIVE
Blood, UA: NEGATIVE
Glucose, UA: NEGATIVE
Ketones, UA: NEGATIVE
Leukocytes, UA: NEGATIVE
Nitrite, UA: NEGATIVE
Protein, UA: NEGATIVE
Spec Grav, UA: 1.015 (ref 1.010–1.025)
Urobilinogen, UA: 0.2 E.U./dL
pH, UA: 5.5 (ref 5.0–8.0)

## 2020-03-06 NOTE — Patient Instructions (Signed)
Stay hydrated.  No news is good news.   Let us know if you need anything.

## 2020-03-06 NOTE — Progress Notes (Signed)
Chief Complaint  Patient presents with   Urinary Frequency    Amber Morgan is a 86 y.o. female here for possible UTI. Here w daughter-in-law.   Duration: 1 day. Symptoms: Dysuria, urinary frequency Denies: hematuria, urinary hesitancy, urinary retention, fever, nausea, vomiting and flank pain, vaginal discharge Hx of recurrent UTI? No  Past Medical History:  Diagnosis Date   Allergy    Arthritis    Cancer ([place])    breast cancer    Colon polyps    Diverticulitis    Diverticulosis 01/04/2014   Glaucoma    Hernia, inguinal, right    History of chicken pox    childhood   History of hiatal hernia    HTN (hypertension)    Hyperglycemia 09/05/2013   Lipoma of arm 12/09/2014   right   Nocturia 02/08/2013   Osteopenia 12/09/2014   Personal history of colonic polyps 09/05/2013   Pre-diabetes    Preventative health care 08/14/2016   Unspecified constipation 02/06/2013   Urinary incontinence    Viral infection characterized by skin and mucous membrane lesions 05/11/2013   Wears glasses      BP (!) 162/68 (BP Location: Left Arm, Patient Position: Sitting, Cuff Size: Normal)    Pulse (!) 102    Temp (!) 96 F (35.6 C) (Temporal)    Ht 4' 11.5" (1.511 m)    Wt 130 lb (59 kg)    SpO2 98%    BMI 25.82 kg/m  General: Awake, alert, appears stated age Heart: RRR Lungs: CTAB, normal respiratory effort, no accessory muscle usage Abd: BS+, soft, NT, ND, no masses or organomegaly MSK: No CVA tenderness, neg Lloyd's sign Psych: Age appropriate judgment and insight  Urinary anomaly - Plan: POCT Urinalysis Dipstick (Automated), Urine Culture  She may have been dehydrated. No signs of infection. Hold on tx. Culture.  Seek immediate care if pt starts to develop fevers, new/worsening symptoms, uncontrollable N/V. F/u prn. The patient and her DIL voiced understanding and agreement to the plan.  [place], DO 03/06/20 1:08 PM

## 2020-03-08 ENCOUNTER — Other Ambulatory Visit: Payer: Self-pay

## 2020-03-08 ENCOUNTER — Ambulatory Visit
Admission: RE | Admit: 2020-03-08 | Discharge: 2020-03-08 | Disposition: A | Discharge status: Home / Self Care | Payer: Medicare HMO | Source: Ambulatory Visit | Attending: Adult Health | Admitting: Adult Health

## 2020-03-08 DIAGNOSIS — R922 Inconclusive mammogram: Secondary | ICD-10-CM | POA: Diagnosis not present

## 2020-03-08 DIAGNOSIS — Z853 Personal history of malignant neoplasm of breast: Secondary | ICD-10-CM

## 2020-03-08 LAB — URINE CULTURE
MICRO NUMBER:: 10543925
SPECIMEN QUALITY:: ADEQUATE

## 2020-03-15 DIAGNOSIS — H44522 Atrophy of globe, left eye: Secondary | ICD-10-CM | POA: Diagnosis not present

## 2020-03-15 DIAGNOSIS — H35361 Drusen (degenerative) of macula, right eye: Secondary | ICD-10-CM | POA: Diagnosis not present

## 2020-03-15 DIAGNOSIS — H35351 Cystoid macular degeneration, right eye: Secondary | ICD-10-CM | POA: Diagnosis not present

## 2020-03-15 DIAGNOSIS — H35371 Puckering of macula, right eye: Secondary | ICD-10-CM | POA: Diagnosis not present

## 2020-04-24 ENCOUNTER — Telehealth: Payer: Self-pay | Admitting: Family Medicine

## 2020-04-24 NOTE — Telephone Encounter (Signed)
Left message for patient to call back and schedule Medicare Annual Wellness Visit (AWV) with Nurse Health Advisor   This should be a 88 MINUTE VISIT.  Last AWV 04/21/19

## 2020-05-05 NOTE — Progress Notes (Signed)
[facility]  [phone] [phone]     ID: Amber Morgan DOB: [birth year]  MR#: [id number]  IRS#:[id number]  Patient Care Team: Mosie Lukes, MD as PCP - General (Family Medicine) Jerline Pain Mingo Amber, DO as Consulting Physician (Osteopathic Medicine) Rolan Lipa, MD as Consulting Physician (Gastroenterology) Stanford Breed Denice Bors, MD as Consulting Physician (Cardiology) [name], MD as Consulting Physician (General Surgery) Alyzae Hawkey, Virgie Dad, MD as Consulting Physician (Oncology) Eppie Gibson, MD as Attending Physician (Radiation Oncology) OTHER MD:  CHIEF COMPLAINT: Estrogen receptor positive lobular breast cancer  CURRENT TREATMENT: Observation   INTERVAL HISTORY: Amber Morgan is contacted today for follow-up of her estrogen receptor positive lobular breast cancer. She continues under observation.  She is accompanied by her older son, Fritz Pickerel  Since her last visit, she underwent bilateral diagnostic mammography with tomography at [facility] on 03/08/2020 showing: breast density category C; no evidence of malignancy in either breast.  She also underwent right lower extremity venous ultrasound on 01/26/2020 to evaluate for DVT. This was negative for DVT.   REVIEW OF SYSTEMS: Amber Morgan now is living in [place], a retirement community, she finds it a bit lonely and does not like the food but she has made some friends there she says.  She walks outside in the parking lot in the early morning or sometimes indoors if the weather is bad.  She has received the Moderna vaccine and tolerated it well.  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  A detailed review of systems today was otherwise stable  HISTORY OF CURRENT ILLNESS: From the original intake note:  Amber Morgan had routine screening mammography on 03/03/2018 showing a possible abnormality in the right breast. She underwent unilateral right  diagnostic mammography with tomography and right breast ultrasonography at [facility] on 03/14/2018 showing: breast density category C. In the right breast, at the 1 o'clock upper inner quadrant there is a hypoechoic mass measuring 0.9 x 0.9 x 0.7 cm located 5 cm from the nipple. Ultrasonography revealed no evidence of lymphadenopathy in the right axilla.   Accordingly on 03/18/2018 she proceeded to biopsy of the right breast area in question. The pathology from this procedure showed ([id number]): Invasive lobular carcinoma grade II, spanning 0.6 cm. Prognostic indicators significant for: estrogen receptor, 100% positive and progesterone receptor, 100% positive, both with strong staining intensity. Proliferation marker Ki67 at 2%. HER2 not amplified with ratios HER2/CEP17 signals 1.62 and average HER2 copies per cell 3.80  The patient's subsequent history is as detailed below.   PAST MEDICAL HISTORY: Past Medical History:  Diagnosis Date   Allergy    Arthritis    Cancer ([place])    breast cancer    Colon polyps    Diverticulitis    Diverticulosis 01/04/2014   Glaucoma    Hernia, inguinal, right    History of chicken pox    childhood   History of hiatal hernia    HTN (hypertension)    Hyperglycemia 09/05/2013   Lipoma of arm 12/09/2014   right   Nocturia 02/08/2013   Osteopenia 12/09/2014   Personal history of colonic polyps 09/05/2013   Pre-diabetes    Preventative health care 08/14/2016   Unspecified constipation 02/06/2013   Urinary incontinence    Viral infection characterized by skin and mucous membrane lesions 05/11/2013   Wears glasses     PAST SURGICAL HISTORY: Past Surgical History:  Procedure Laterality  Date   ABDOMINAL HYSTERECTOMY     APPENDECTOMY     BLADDER SURGERY  2003   bladder tact   BREAST BIOPSY Right 2018-04-08   BREAST LUMPECTOMY Right 04/2018   BREAST LUMPECTOMY WITH RADIOACTIVE SEED LOCALIZATION Right 04/12/2018   Procedure: BREAST  LUMPECTOMY WITH RADIOACTIVE SEED LOCALIZATION;  Surgeon: [name], MD;  Location: [facility];  Service: General;  Laterality: Right;   CATARACT EXTRACTION, BILATERAL     COLONOSCOPY W/ BIOPSIES AND POLYPECTOMY     EYE SURGERY     multiple bilateral   INGUINAL HERNIA REPAIR Right 04/12/2018   Procedure: OPEN RIGHT INGUINAL HERNIA REPAIR WITH MESH;  Surgeon: [name], MD;  Location: [facility];  Service: General;  Laterality: Right;   RECTOCELE REPAIR Bilateral   Appendectomy and planned Cholescystectomy   FAMILY HISTORY Family History  Problem Relation Age of Onset   Colon cancer Mother    Hypertension Mother    Heart disease Father    Meniere's disease Father    Heart disease Sister    Arthritis Son        back   Glaucoma Son    Hypertension Son    Cataracts Son    Colon cancer Sister    Alzheimer's disease Sister    Glaucoma Sister    Glaucoma Sister    Hyperlipidemia Sister    Hypertension Sister    Cancer Sister        thyroid   Diabetes Sister    Glaucoma Sister    Breast cancer Paternal Aunt    Breast cancer Paternal 67    The patient's father died at age 48 due to MI. The patient's mother died at 52. The patient's mother had colon cancer diagnosed at age 64. There was a paternal aunt with breast cancer diagnosed at an old age. The patient's sister was diagnosed with colon cancer at age 32. She denies a history of ovarian cancer in the family.    GYNECOLOGIC HISTORY:  No LMP recorded. Patient has had a hysterectomy. Menarche: 86 years old Age at first live birth: 86 years old She is GXP4. She is status post total hysterectomy with bilateral salpingo-oophorectomy in 1978. The patient briefly took HRT, and she didn't have intense hot flashes. She never used contraception.    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 Her husband died in April 08, 2018 due to lung issues. At home is herself with no pets.  She [facility] going [facility] [address] in March 2020 but this has been postponed because of the pandemic. The patient's son, Fritz Pickerel is retired from Research officer, trade union and [place]. The patient's son [name] is retired after 30 years in the [employer]. The patient's son, Alveta Heimlich is a Designer, industrial/product in [place]. The patient's son Monica Martinez, is an Scientist, research (physical sciences) in [place]. The patient has 8 grandchildren and 8 great- grandchildren. The patient attends [employer].   ADVANCED DIRECTIVES: The patient's son- Breyah Akhter [phone] is her healthcare power of attorney   HEALTH MAINTENANCE: Social History   Tobacco Use   Smoking status: Never Smoker   Smokeless tobacco: Never Used  Vaping Use   Vaping Use: Never used  Substance Use Topics   Alcohol use: No   Drug use: No     Colonoscopy: 12/2013 / Dr. Sherrye Payor / polyp removal  PAP:   Bone density: 12/18/16 T score of -0.6   Allergies  Allergen Reactions   Amlodipine Other (See Comments)  Mental status chnages ie confusion   Doxycycline Rash    Current Outpatient Medications  Medication Sig Dispense Refill   Artificial Tear Solution (GENTEAL TEARS OP) Place 1 drop into both eyes 2 (two) times daily.     aspirin 81 MG EC tablet Take 81 mg by mouth daily. Swallow whole.     cholecalciferol (VITAMIN D) 1000 units tablet Take 1 tablet (1,000 Units total) by mouth daily.     Difluprednate (DUREZOL OP) Place 1 drop into the right eye daily.     Fiber POWD Take 1 Scoop by mouth daily.     Multiple Vitamins-Calcium (ONE-A-DAY WITHIN PO) Take 1 tablet by mouth daily.     Polyethyl Glycol-Propyl Glycol (SYSTANE OP) Apply to eye.     Polyethyl Glycol-Propyl Glycol (SYSTANE) 0.4-0.3 % GEL ophthalmic gel Place 1 application into both eyes at bedtime.     Probiotic Product (PROBIOTIC DAILY PO) Take 1 capsule by mouth daily.      No current facility-administered medications for this visit.    OBJECTIVE:  white woman in no acute  distress  Vitals:   05/06/20 1450  BP: (!) 168/65  Pulse: 72  Resp: 18  Temp: 98.7 F (37.1 C)  SpO2: 99%     Body mass index is 25.1 kg/m.   Wt Readings from Last 3 Encounters:  05/06/20 126 lb 6.4 oz (57.3 kg)  03/06/20 130 lb (59 kg)  01/22/20 126 lb 6.4 oz (57.3 kg)      ECOG FS:1 - Symptomatic but completely ambulatory  Sclerae unicteric, left pupil occluded Wearing a mask No cervical or supraclavicular adenopathy Lungs no rales or rhonchi Heart regular rate and rhythm Abd soft, nontender, positive bowel sounds MSK no focal spinal tenderness, no upper extremity lymphedema Neuro: nonfocal, well oriented, appropriate affect Breasts: The right breast is status post lumpectomy.  There is no evidence of local recurrence per the left breast is benign.  Both axillae are benign.   LAB RESULTS:  CMP     Component Value Date/Time   NA 140 05/06/2020 1422   K 4.6 05/06/2020 1422   CL 107 05/06/2020 1422   CO2 27 05/06/2020 1422   GLUCOSE 102 (H) 05/06/2020 1422   BUN 31 (H) 05/06/2020 1422   CREATININE 1.00 05/06/2020 1422   CREATININE 0.97 03/30/2018 1214   CREATININE 0.79 05/25/2014 0853   CALCIUM 10.9 (H) 05/06/2020 1422   PROT 6.8 05/06/2020 1422   ALBUMIN 3.9 05/06/2020 1422   Morgan 19 05/06/2020 1422   Morgan 20 03/30/2018 1214   ALT 19 05/06/2020 1422   ALT 21 03/30/2018 1214   ALKPHOS 60 05/06/2020 1422   BILITOT 0.3 05/06/2020 1422   BILITOT 0.3 03/30/2018 1214   GFRNONAA 50 (L) 05/06/2020 1422   GFRNONAA 51 (L) 03/30/2018 1214   GFRAA 57 (L) 05/06/2020 1422   GFRAA 59 (L) 03/30/2018 1214    No results found for: TOTALPROTELP, ALBUMINELP, A1GS, A2GS, BETS, BETA2SER, GAMS, MSPIKE, SPEI  No results found for: KPAFRELGTCHN, LAMBDASER, KAPLAMBRATIO  Lab Results  Component Value Date   WBC 8.5 05/06/2020   NEUTROABS 5.3 05/06/2020   HGB 12.7 05/06/2020   HCT 37.9 05/06/2020   MCV 95.9 05/06/2020   PLT 238 05/06/2020   No results found for:  LABCA2  No components found for: IONGEX528  No results for input(s): INR in the last 168 hours.  No results found for: LABCA2  No results found for: UXL244  No results found for: WNU272  No results found for: GBE010  No results found for: CA2729  No components found for: HGQUANT  No results found for: CEA1 / No results found for: CEA1   No results found for: AFPTUMOR  No results found for: CHROMOGRNA  No results found for: HGBA, HGBA2QUANT, HGBFQUANT, HGBSQUAN (Hemoglobinopathy evaluation)   No results found for: LDH  No results found for: IRON, TIBC, IRONPCTSAT (Iron and TIBC)  No results found for: FERRITIN  Urinalysis    Component Value Date/Time   COLORURINE YELLOW 09/21/2019 1120   APPEARANCEUR CLEAR 09/21/2019 1120   LABSPEC 1.020 09/21/2019 1120   PHURINE 6.0 09/21/2019 1120   GLUCOSEU NEGATIVE 09/21/2019 1120   HGBUR NEGATIVE 09/21/2019 1120   BILIRUBINUR negative 03/06/2020 1254   KETONESUR NEGATIVE 09/21/2019 1120   PROTEINUR Negative 03/06/2020 1254   PROTEINUR NEG 02/06/2013 1053   UROBILINOGEN 0.2 03/06/2020 1254   UROBILINOGEN 0.2 09/21/2019 1120   NITRITE negative 03/06/2020 1254   NITRITE NEGATIVE 09/21/2019 1120   LEUKOCYTESUR Negative 03/06/2020 1254   LEUKOCYTESUR NEGATIVE 09/21/2019 1120    STUDIES: No results found.    ELIGIBLE FOR AVAILABLE RESEARCH PROTOCOL: no  ASSESSMENT: 86 y.o. [place], [place] woman status post right breast upper inner quadrant biopsy 03/18/2018 for a clinical T1b No, stage IA base of lobular carcinoma, grade 2, estrogen and progesterone receptor positive, HER-2 not amplified, with an MIB-1 of 2%.  (1) status post right lumpectomy with no sentinel lymph node sampling 04/12/2018 for a pT1c cN0, stage IA invasive lobular carcinoma, with negative margins  (2) no adjuvant radiation indicated  (3) anastrozole prescribed 05/20/2018 but never started by the patient  (a) bone density 03/03/2018 finds a T score of  -1.8 (osteopenia).   PLAN: Amber Morgan is now just over 2 years out from definitive surgery for her breast cancer with no evidence of disease recurrence.  This is very favorable.  Her breasts are still a bit dense.  It is important for her to continue to receive mammography on a yearly basis course as this is exactly what she is doing.  I commended her walking program and alerted her to the risk of falls.  Otherwise she will see me again in 1 year  She knows to call for any other issue that may develop before the next visit  Total encounter time 25 minutes.*   Amber Morgan, Virgie Dad, MD  05/06/20 3:11 PM Medical Oncology and Hematology [facility] [place], [place] [address] Tel. [phone]    Fax. [phone]    I, Wilburn Mylar, am acting as scribe for Dr. Virgie Dad. Amber Morgan.  I, Lurline Del MD, have reviewed the above documentation for accuracy and completeness, and I agree with the above.    *Total Encounter Time as defined by the Centers for Medicare and Medicaid Services includes, in addition to the face-to-face time of a patient visit (documented in the note above) non-face-to-face time: obtaining and reviewing outside history, ordering and reviewing medications, tests or procedures, care coordination (communications with other health care professionals or caregivers) and documentation in the medical record.

## 2020-05-06 ENCOUNTER — Ambulatory Visit: Payer: Medicare HMO | Admitting: Oncology

## 2020-05-06 ENCOUNTER — Inpatient Hospital Stay (HOSPITAL_BASED_OUTPATIENT_CLINIC_OR_DEPARTMENT_OTHER): Payer: Medicare HMO | Admitting: Oncology

## 2020-05-06 ENCOUNTER — Other Ambulatory Visit: Payer: Medicare HMO

## 2020-05-06 ENCOUNTER — Other Ambulatory Visit: Payer: Self-pay

## 2020-05-06 ENCOUNTER — Inpatient Hospital Stay: Payer: Medicare HMO | Attending: Oncology

## 2020-05-06 VITALS — BP 168/65 | HR 72 | Temp 98.7°F | Resp 18 | Ht 59.5 in | Wt 126.4 lb

## 2020-05-06 DIAGNOSIS — Z170 Estrogen receptor positive status [ER+]: Secondary | ICD-10-CM

## 2020-05-06 DIAGNOSIS — Z79811 Long term (current) use of aromatase inhibitors: Secondary | ICD-10-CM | POA: Diagnosis not present

## 2020-05-06 DIAGNOSIS — C50211 Malignant neoplasm of upper-inner quadrant of right female breast: Secondary | ICD-10-CM

## 2020-05-06 DIAGNOSIS — M8580 Other specified disorders of bone density and structure, unspecified site: Secondary | ICD-10-CM | POA: Insufficient documentation

## 2020-05-06 DIAGNOSIS — I10 Essential (primary) hypertension: Secondary | ICD-10-CM | POA: Insufficient documentation

## 2020-05-06 LAB — COMPREHENSIVE METABOLIC PANEL
ALT: 19 U/L (ref 0–44)
AST: 19 U/L (ref 15–41)
Albumin: 3.9 g/dL (ref 3.5–5.0)
Alkaline Phosphatase: 60 U/L (ref 38–126)
Anion gap: 6 (ref 5–15)
BUN: 31 mg/dL — ABNORMAL HIGH (ref 8–23)
CO2: 27 mmol/L (ref 22–32)
Calcium: 10.9 mg/dL — ABNORMAL HIGH (ref 8.9–10.3)
Chloride: 107 mmol/L (ref 98–111)
Creatinine, Ser: 1 mg/dL (ref 0.44–1.00)
GFR calc Af Amer: 57 mL/min — ABNORMAL LOW (ref >60)
GFR calc non Af Amer: 50 mL/min — ABNORMAL LOW (ref >60)
Glucose, Bld: 102 mg/dL — ABNORMAL HIGH (ref 70–99)
Potassium: 4.6 mmol/L (ref 3.5–5.1)
Sodium: 140 mmol/L (ref 135–145)
Total Bilirubin: 0.3 mg/dL (ref 0.3–1.2)
Total Protein: 6.8 g/dL (ref 6.5–8.1)

## 2020-05-06 LAB — CBC WITH DIFFERENTIAL/PLATELET
Abs Immature Granulocytes: 0.01 10*3/uL (ref 0.00–0.07)
Basophils Absolute: 0 10*3/uL (ref 0.0–0.1)
Basophils Relative: 1 %
Eosinophils Absolute: 0.1 10*3/uL (ref 0.0–0.5)
Eosinophils Relative: 1 %
HCT: 37.9 % (ref 36.0–46.0)
Hemoglobin: 12.7 g/dL (ref 12.0–15.0)
Immature Granulocytes: 0 %
Lymphocytes Relative: 27 %
Lymphs Abs: 2.3 10*3/uL (ref 0.7–4.0)
MCH: 32.2 pg (ref 26.0–34.0)
MCHC: 33.5 g/dL (ref 30.0–36.0)
MCV: 95.9 fL (ref 80.0–100.0)
Monocytes Absolute: 0.8 10*3/uL (ref 0.1–1.0)
Monocytes Relative: 9 %
Neutro Abs: 5.3 10*3/uL (ref 1.7–7.7)
Neutrophils Relative %: 62 %
Platelets: 238 10*3/uL (ref 150–400)
RBC: 3.95 MIL/uL (ref 3.87–5.11)
RDW: 12.8 % (ref 11.5–15.5)
WBC: 8.5 10*3/uL (ref 4.0–10.5)
nRBC: 0 % (ref 0.0–0.2)

## 2020-05-07 ENCOUNTER — Telehealth: Payer: Self-pay | Admitting: Oncology

## 2020-05-07 NOTE — Telephone Encounter (Signed)
Scheduled appts per 8/2 los. Left voicemail with appt date and time.

## 2020-05-17 DIAGNOSIS — H35371 Puckering of macula, right eye: Secondary | ICD-10-CM | POA: Diagnosis not present

## 2020-05-17 DIAGNOSIS — H44522 Atrophy of globe, left eye: Secondary | ICD-10-CM | POA: Diagnosis not present

## 2020-05-17 DIAGNOSIS — H35361 Drusen (degenerative) of macula, right eye: Secondary | ICD-10-CM | POA: Diagnosis not present

## 2020-05-17 DIAGNOSIS — H35351 Cystoid macular degeneration, right eye: Secondary | ICD-10-CM | POA: Diagnosis not present

## 2020-05-27 ENCOUNTER — Other Ambulatory Visit: Payer: Self-pay

## 2020-05-27 ENCOUNTER — Ambulatory Visit (INDEPENDENT_AMBULATORY_CARE_PROVIDER_SITE_OTHER): Payer: Medicare HMO | Admitting: Family Medicine

## 2020-05-27 VITALS — BP 124/68 | HR 92 | Temp 97.8°F | Resp 13 | Ht 62.0 in | Wt 127.0 lb

## 2020-05-27 DIAGNOSIS — K5900 Constipation, unspecified: Secondary | ICD-10-CM

## 2020-05-27 DIAGNOSIS — E871 Hypo-osmolality and hyponatremia: Secondary | ICD-10-CM | POA: Diagnosis not present

## 2020-05-27 DIAGNOSIS — H409 Unspecified glaucoma: Secondary | ICD-10-CM

## 2020-05-27 DIAGNOSIS — E785 Hyperlipidemia, unspecified: Secondary | ICD-10-CM | POA: Diagnosis not present

## 2020-05-27 DIAGNOSIS — I10 Essential (primary) hypertension: Secondary | ICD-10-CM | POA: Diagnosis not present

## 2020-05-27 DIAGNOSIS — R739 Hyperglycemia, unspecified: Secondary | ICD-10-CM | POA: Diagnosis not present

## 2020-05-27 NOTE — Progress Notes (Signed)
Subjective:    Patient ID: Amber Morgan, female    DOB: [birth year], 86 y.o.   MRN: [id number]  Chief Complaint  Patient presents with  . 4 month follow up    HPI Patient is in today for follow up on chronic medical concerns. No recent febrile illness or hospitalizations. No acute concerns. She continues to follow with oncology but is doing well with her breast cancer diagnosis. No c/o polyuria or polydipsia. She stays active and eats a heart healthy diet. Denies CP/palp/SOB/HA/congestion/fevers/GI or GU c/o. Taking meds as prescribed  Past Medical History:  Diagnosis Date  . Allergy   . Arthritis   . Cancer [facility])    breast cancer   . Colon polyps   . Diverticulitis   . Diverticulosis 01/04/2014  . Glaucoma   . Hernia, inguinal, right   . History of chicken pox    childhood  . History of hiatal hernia   . HTN (hypertension)   . Hyperglycemia 09/05/2013  . Lipoma of arm 12/09/2014   right  . Nocturia 02/08/2013  . Osteopenia 12/09/2014  . Personal history of colonic polyps 09/05/2013  . Pre-diabetes   . Preventative health care 08/14/2016  . Unspecified constipation 02/06/2013  . Urinary incontinence   . Viral infection characterized by skin and mucous membrane lesions 05/11/2013  . Wears glasses     Past Surgical History:  Procedure Laterality Date  . ABDOMINAL HYSTERECTOMY    . APPENDECTOMY    . BLADDER SURGERY  2003   bladder tact  . BREAST BIOPSY Right 03/2018  . BREAST LUMPECTOMY Right 04/2018  . BREAST LUMPECTOMY WITH RADIOACTIVE SEED LOCALIZATION Right 04/12/2018   Procedure: BREAST LUMPECTOMY WITH RADIOACTIVE SEED LOCALIZATION;  Surgeon: [name], MD;  Location: [facility];  Service: General;  Laterality: Right;  . CATARACT EXTRACTION, BILATERAL    . COLONOSCOPY W/ BIOPSIES AND POLYPECTOMY    . EYE SURGERY     multiple bilateral  . INGUINAL HERNIA REPAIR Right 04/12/2018   Procedure: OPEN RIGHT INGUINAL HERNIA REPAIR WITH MESH;  Surgeon: [name], MD;  Location:  [facility];  Service: General;  Laterality: Right;  . RECTOCELE REPAIR Bilateral     Family History  Problem Relation Age of Onset  . Colon cancer Mother   . Hypertension Mother   . Heart disease Father   . Meniere's disease Father   . Heart disease Sister   . Arthritis Son        back  . Glaucoma Son   . Hypertension Son   . Cataracts Son   . Colon cancer Sister   . Alzheimer's disease Sister   . Glaucoma Sister   . Glaucoma Sister   . Hyperlipidemia Sister   . Hypertension Sister   . Cancer Sister        thyroid  . Diabetes Sister   . Glaucoma Sister   . Breast cancer Paternal Aunt   . Breast cancer Paternal Aunt     Social History   Socioeconomic History  . Marital status: Married    Spouse name: Not on file  . Number of children: 4  . Years of education: Not on file  . Highest education level: Not on file  Occupational History  . Not on file  Tobacco Use  . Smoking status: Never Smoker  . Smokeless tobacco: Never Used  Vaping Use  . Vaping Use: Never used  Substance and Sexual Activity  . Alcohol use: No  . Drug use:  No  . Sexual activity: Not Currently  Other Topics Concern  . Not on file  Social History Narrative  . Not on file   Social Determinants of Health   Financial Resource Strain:   . Difficulty of Paying Living Expenses: Not on file  Food Insecurity:   . Worried About Charity fundraiser in the Last Year: Not on file  . Ran Out of Food in the Last Year: Not on file  Transportation Needs:   . Lack of Transportation (Medical): Not on file  . Lack of Transportation (Non-Medical): Not on file  Physical Activity:   . Days of Exercise per Week: Not on file  . Minutes of Exercise per Session: Not on file  Stress:   . Feeling of Stress : Not on file  Social Connections:   . Frequency of Communication with Friends and Family: Not on file  . Frequency of Social Gatherings with Friends and Family: Not on file  . Attends Religious Services: Not on  file  . Active Member of Clubs or Organizations: Not on file  . Attends Archivist Meetings: Not on file  . Marital Status: Not on file  Intimate Partner Violence:   . Fear of Current or Ex-Partner: Not on file  . Emotionally Abused: Not on file  . Physically Abused: Not on file  . Sexually Abused: Not on file    Outpatient Medications Prior to Visit  Medication Sig Dispense Refill  . Artificial Tear Solution (GENTEAL TEARS OP) Place 1 drop into both eyes 2 (two) times daily.    Marland Kitchen aspirin 81 MG EC tablet Take 81 mg by mouth daily. Swallow whole.    . cholecalciferol (VITAMIN D) 1000 units tablet Take 1 tablet (1,000 Units total) by mouth daily.    . Difluprednate (DUREZOL OP) Place 2 drops into the right eye daily.     . Fiber POWD Take 2 Scoops by mouth daily.     . Multiple Vitamins-Calcium (ONE-A-DAY WITHIN PO) Take 1 tablet by mouth daily.    Vladimir Faster Glycol-Propyl Glycol (SYSTANE OP) Apply to eye.    Vladimir Faster Glycol-Propyl Glycol (SYSTANE) 0.4-0.3 % GEL ophthalmic gel Place 1 application into both eyes at bedtime.    . Probiotic Product (PROBIOTIC DAILY PO) Take 1 capsule by mouth daily.      No facility-administered medications prior to visit.    Allergies  Allergen Reactions  . Amlodipine Other (See Comments)    Mental status chnages ie confusion  . Doxycycline Rash    Review of Systems  Constitutional: Negative.  Negative for fever and malaise/fatigue.  HENT: Negative for congestion.   Eyes: Negative for blurred vision.  Respiratory: Negative for shortness of breath.   Cardiovascular: Negative for chest pain, palpitations and leg swelling.  Gastrointestinal: Negative for abdominal pain, blood in stool and nausea.  Genitourinary: Negative for dysuria and frequency.  Musculoskeletal: Negative for falls.  Skin: Negative for rash.  Neurological: Negative for dizziness, loss of consciousness and headaches.  Endo/Heme/Allergies: Negative for environmental  allergies.  Psychiatric/Behavioral: Negative for depression. The patient is not nervous/anxious.        Objective:    Physical Exam Vitals and nursing note reviewed.  Constitutional:      General: She is not in acute distress.    Appearance: She is well-developed.  HENT:     Head: Normocephalic and atraumatic.     Nose: Nose normal.  Eyes:     General:  Right eye: No discharge.        Left eye: No discharge.  Cardiovascular:     Rate and Rhythm: Normal rate and regular rhythm.     Heart sounds: No murmur heard.   Pulmonary:     Effort: Pulmonary effort is normal.     Breath sounds: Normal breath sounds.  Abdominal:     General: Bowel sounds are normal.     Palpations: Abdomen is soft.     Tenderness: There is no abdominal tenderness.  Musculoskeletal:     Cervical back: Normal range of motion and neck supple.  Skin:    General: Skin is warm and dry.  Neurological:     Mental Status: She is alert and oriented to person, place, and time.     BP 124/68 (BP Location: Left Arm, Patient Position: Sitting, Cuff Size: Normal)   Pulse 92   Temp 97.8 F (36.6 C) (Oral)   Resp 13   Ht 5\' 2"  (1.575 m)   Wt 127 lb (57.6 kg)   SpO2 98%   BMI 23.23 kg/m  Wt Readings from Last 3 Encounters:  05/27/20 127 lb (57.6 kg)  05/06/20 126 lb 6.4 oz (57.3 kg)  03/06/20 130 lb (59 kg)    Diabetic Foot Exam - Simple   No data filed     Lab Results  Component Value Date   WBC 8.5 05/06/2020   HGB 12.7 05/06/2020   HCT 37.9 05/06/2020   PLT 238 05/06/2020   GLUCOSE 102 (H) 05/06/2020   CHOL 152 01/22/2020   TRIG 104.0 01/22/2020   HDL 58.10 01/22/2020   LDLCALC 74 01/22/2020   ALT 19 05/06/2020   Morgan 19 05/06/2020   NA 140 05/06/2020   K 4.6 05/06/2020   CL 107 05/06/2020   CREATININE 1.00 05/06/2020   BUN 31 (H) 05/06/2020   CO2 27 05/06/2020   TSH 2.67 01/22/2020   HGBA1C 6.1 01/22/2020   MICROALBUR 0.8 08/06/2016    Lab Results  Component Value Date    TSH 2.67 01/22/2020   Lab Results  Component Value Date   WBC 8.5 05/06/2020   HGB 12.7 05/06/2020   HCT 37.9 05/06/2020   MCV 95.9 05/06/2020   PLT 238 05/06/2020   Lab Results  Component Value Date   NA 140 05/06/2020   K 4.6 05/06/2020   CO2 27 05/06/2020   GLUCOSE 102 (H) 05/06/2020   BUN 31 (H) 05/06/2020   CREATININE 1.00 05/06/2020   BILITOT 0.3 05/06/2020   ALKPHOS 60 05/06/2020   Morgan 19 05/06/2020   ALT 19 05/06/2020   PROT 6.8 05/06/2020   ALBUMIN 3.9 05/06/2020   CALCIUM 10.9 (H) 05/06/2020   ANIONGAP 6 05/06/2020   GFR 60.29 01/22/2020   Lab Results  Component Value Date   CHOL 152 01/22/2020   Lab Results  Component Value Date   HDL 58.10 01/22/2020   Lab Results  Component Value Date   LDLCALC 74 01/22/2020   Lab Results  Component Value Date   TRIG 104.0 01/22/2020   Lab Results  Component Value Date   CHOLHDL 3 01/22/2020   Lab Results  Component Value Date   HGBA1C 6.1 01/22/2020       Assessment & Plan:   Problem List Items Addressed This Visit    HTN (hypertension)    Well controlled, no changes to meds. Encouraged heart healthy diet such as the DASH diet and exercise as tolerated.  Relevant Orders   TSH   Glaucoma    Dr Baird Cancer is seeing her every 2 months and she is doing better on Durazol 2 drops, 1 drop was insufficient      Hyperlipidemia - Primary   Relevant Orders   Lipid panel   Constipation    Maintain hydration and fiber in diet. Daily probiotics. Doing well      Hyperglycemia   Relevant Orders   Hemoglobin A1c   Hyponatremia   Relevant Orders   VITAMIN D 25 Hydroxy (Vit-D Deficiency, Fractures)   Comprehensive metabolic panel   Hypercalcemia    Recheck cmp today, check Vitamin D today, PTH checked today      Relevant Orders   Comprehensive metabolic panel   PTH, intact (no Ca)      I am having Amber Morgan maintain her Probiotic Product (PROBIOTIC DAILY PO), Artificial Tear Solution  (GENTEAL TEARS OP), Fiber, cholecalciferol, Polyethyl Glycol-Propyl Glycol (SYSTANE OP), Systane, Difluprednate (DUREZOL OP), Multiple Vitamins-Calcium (ONE-A-DAY WITHIN PO), and aspirin.  No orders of the defined types were placed in this encounter.    Penni Homans, MD

## 2020-05-27 NOTE — Assessment & Plan Note (Signed)
Recheck cmp today, check Vitamin D today, PTH checked today

## 2020-05-27 NOTE — Patient Instructions (Signed)

## 2020-05-27 NOTE — Assessment & Plan Note (Signed)
Maintain hydration and fiber in diet. Daily probiotics. Doing well

## 2020-05-27 NOTE — Assessment & Plan Note (Signed)
Well controlled, no changes to meds. Encouraged heart healthy diet such as the DASH diet and exercise as tolerated.  °

## 2020-05-27 NOTE — Assessment & Plan Note (Signed)
Dr Baird Cancer is seeing her every 2 months and she is doing better on Durazol 2 drops, 1 drop was insufficient

## 2020-05-28 ENCOUNTER — Encounter: Payer: Self-pay | Admitting: Family Medicine

## 2020-05-28 LAB — COMPREHENSIVE METABOLIC PANEL
AG Ratio: 1.6 (calc) (ref 1.0–2.5)
ALT: 17 U/L (ref 6–29)
AST: 19 U/L (ref 10–35)
Albumin: 3.9 g/dL (ref 3.6–5.1)
Alkaline phosphatase (APISO): 52 U/L (ref 37–153)
BUN/Creatinine Ratio: 22 (calc) (ref 6–22)
BUN: 22 mg/dL (ref 7–25)
CO2: 28 mmol/L (ref 20–32)
Calcium: 9.7 mg/dL (ref 8.6–10.4)
Chloride: 106 mmol/L (ref 98–110)
Creat: 0.98 mg/dL — ABNORMAL HIGH (ref 0.60–0.88)
Globulin: 2.4 g/dL (calc) (ref 1.9–3.7)
Glucose, Bld: 92 mg/dL (ref 65–99)
Potassium: 4.3 mmol/L (ref 3.5–5.3)
Sodium: 140 mmol/L (ref 135–146)
Total Bilirubin: 0.4 mg/dL (ref 0.2–1.2)
Total Protein: 6.3 g/dL (ref 6.1–8.1)

## 2020-05-28 LAB — LIPID PANEL
Cholesterol: 163 mg/dL (ref <200)
HDL: 63 mg/dL (ref >=50)
LDL Cholesterol (Calc): 85 mg/dL (calc)
Non-HDL Cholesterol (Calc): 100 mg/dL (calc) (ref <130)
Total CHOL/HDL Ratio: 2.6 (calc) (ref <5.0)
Triglycerides: 68 mg/dL (ref <150)

## 2020-05-28 LAB — HEMOGLOBIN A1C
Hgb A1c MFr Bld: 6 % of total Hgb — ABNORMAL HIGH (ref <5.7)
Mean Plasma Glucose: 126 (calc)
eAG (mmol/L): 7 (calc)

## 2020-05-28 LAB — VITAMIN D 25 HYDROXY (VIT D DEFICIENCY, FRACTURES): Vit D, 25-Hydroxy: 35 ng/mL (ref 30–100)

## 2020-05-28 LAB — TSH: TSH: 2.41 mIU/L (ref 0.40–4.50)

## 2020-05-28 LAB — PARATHYROID HORMONE, INTACT (NO CA): PTH: 70 pg/mL — ABNORMAL HIGH (ref 14–64)

## 2020-07-08 ENCOUNTER — Telehealth: Payer: Self-pay | Admitting: Family Medicine

## 2020-07-08 NOTE — Telephone Encounter (Signed)
Yes it is fine to take the pneumonia shot, she needs Pneumvax (PCV23)

## 2020-07-08 NOTE — Telephone Encounter (Signed)
Caller : Shyonna Call Back # [phone]    Pt would like to know if she is  due for  pneumonia vaccine this year, pt states she would like to know if it is ok to take with Durezol eye drops.

## 2020-07-09 NOTE — Telephone Encounter (Signed)
Left detailed message on machine that it is ok to take vaccine.

## 2020-07-12 DIAGNOSIS — H35371 Puckering of macula, right eye: Secondary | ICD-10-CM | POA: Diagnosis not present

## 2020-07-12 DIAGNOSIS — H35361 Drusen (degenerative) of macula, right eye: Secondary | ICD-10-CM | POA: Diagnosis not present

## 2020-07-12 DIAGNOSIS — H43811 Vitreous degeneration, right eye: Secondary | ICD-10-CM | POA: Diagnosis not present

## 2020-07-17 ENCOUNTER — Other Ambulatory Visit: Payer: Self-pay

## 2020-07-17 ENCOUNTER — Emergency Department (HOSPITAL_BASED_OUTPATIENT_CLINIC_OR_DEPARTMENT_OTHER)
Admission: EM | Admit: 2020-07-17 | Discharge: 2020-07-17 | Disposition: A | Discharge status: Home / Self Care | Payer: Medicare HMO | Attending: Emergency Medicine | Admitting: Emergency Medicine

## 2020-07-17 ENCOUNTER — Encounter (HOSPITAL_BASED_OUTPATIENT_CLINIC_OR_DEPARTMENT_OTHER): Payer: Self-pay

## 2020-07-17 DIAGNOSIS — Z7982 Long term (current) use of aspirin: Secondary | ICD-10-CM | POA: Insufficient documentation

## 2020-07-17 DIAGNOSIS — Z853 Personal history of malignant neoplasm of breast: Secondary | ICD-10-CM | POA: Insufficient documentation

## 2020-07-17 DIAGNOSIS — I10 Essential (primary) hypertension: Secondary | ICD-10-CM | POA: Diagnosis not present

## 2020-07-17 DIAGNOSIS — H579 Unspecified disorder of eye and adnexa: Secondary | ICD-10-CM | POA: Diagnosis present

## 2020-07-17 DIAGNOSIS — H1131 Conjunctival hemorrhage, right eye: Secondary | ICD-10-CM | POA: Diagnosis not present

## 2020-07-17 MED ORDER — FLUORESCEIN SODIUM 1 MG OP STRP
ORAL_STRIP | OPHTHALMIC | Status: AC
  Filled 2020-07-17: qty 1

## 2020-07-17 MED ORDER — TETRACAINE HCL 0.5 % OP SOLN
OPHTHALMIC | Status: AC
  Filled 2020-07-17: qty 4

## 2020-07-17 NOTE — ED Triage Notes (Signed)
Pt c/o redness to right eye x today-states she fell ~2 weeks ago in the bathroom but did not have redness to right eye-states she was seen by eye doctor last week for "retina problem and he didn't;t mention my eye was red"-NAD-steady gait

## 2020-07-17 NOTE — ED Provider Notes (Signed)
[facility] DEPARTMENT Provider Note   CSN: [id number] Arrival date & time: 07/17/20  1733     History Chief Complaint  Patient presents with  . Eye Problem    Amber Morgan is a 86 y.o. female.  Patient is a 86 year old female with a history of hypertension, hyperlipidemia, glaucoma, retinal problems, blindness in the left eye and hypertension who is presenting today due to redness of her right eye.  Patient reports that she has been seeing the eye doctor and they have given her drops for glaucoma in her retina.  She was feeling normal today when someone asked her why her eye was red.  She looked and noticed that her eye was bloodshot.  She has minimal tenderness in the eye that she describes as a mild ache but no vision changes.  She denies any known trauma to the eye.  She did have a fall 2 to 3 weeks ago but had no eye issues during that time.  She takes a baby aspirin daily but no other anticoagulation.  She has no other complaints at this time.  The history is provided by the patient.  Eye Problem      Past Medical History:  Diagnosis Date  . Allergy   . Arthritis   . Cancer [facility])    breast cancer   . Colon polyps   . Diverticulitis   . Diverticulosis 01/04/2014  . Glaucoma   . Hernia, inguinal, right   . History of chicken pox    childhood  . History of hiatal hernia   . HTN (hypertension)   . Hyperglycemia 09/05/2013  . Lipoma of arm 12/09/2014   right  . Nocturia 02/08/2013  . Osteopenia 12/09/2014  . Personal history of colonic polyps 09/05/2013  . Pre-diabetes   . Preventative health care 08/14/2016  . Unspecified constipation 02/06/2013  . Urinary incontinence   . Viral infection characterized by skin and mucous membrane lesions 05/11/2013  . Wears glasses     Patient Active Problem List   Diagnosis Date Noted  . Hypercalcemia 05/27/2020  . Right knee pain 01/22/2020  . Varicose veins of lower extremity 08/21/2018  . Nail lesion 08/19/2018   . Superficial thrombophlebitis 06/12/2018  . Grief 04/20/2018  . Malignant neoplasm of upper-inner quadrant of right breast in female, estrogen receptor positive ([place]) 03/25/2018  . Abdominal pain 12/12/2017  . Right lower quadrant abdominal pain 11/27/2017  . Diarrhea 02/02/2017  . Urination frequency 02/02/2017  . Tachycardia 02/02/2017  . Preventative health care 08/14/2016  . Bradycardia 11/27/2015  . Hyponatremia 11/08/2015  . Hypokalemia 11/08/2015  . Tinnitus of right ear 08/28/2015  . Medicare annual wellness visit, subsequent 07/08/2015  . Osteopenia 12/09/2014  . Lipoma of arm 12/09/2014  . Diverticulosis 01/04/2014  . Edema 09/05/2013  . Personal history of colonic polyps 09/05/2013  . Hyperglycemia 09/05/2013  . Nocturia 02/08/2013  . Constipation 02/06/2013  . Paresthesia 04/26/2012  . Hyperlipidemia 11/14/2011  . HTN (hypertension) 09/06/2011  . Glaucoma 09/06/2011    Past Surgical History:  Procedure Laterality Date  . ABDOMINAL HYSTERECTOMY    . APPENDECTOMY    . BLADDER SURGERY  2003   bladder tact  . BREAST BIOPSY Right 03/2018  . BREAST LUMPECTOMY Right 04/2018  . BREAST LUMPECTOMY WITH RADIOACTIVE SEED LOCALIZATION Right 04/12/2018   Procedure: BREAST LUMPECTOMY WITH RADIOACTIVE SEED LOCALIZATION;  Surgeon: [name], MD;  Location: [facility];  Service: General;  Laterality: Right;  . CATARACT EXTRACTION, BILATERAL    .  COLONOSCOPY W/ BIOPSIES AND POLYPECTOMY    . EYE SURGERY     multiple bilateral  . INGUINAL HERNIA REPAIR Right 04/12/2018   Procedure: OPEN RIGHT INGUINAL HERNIA REPAIR WITH MESH;  Surgeon: [name], MD;  Location: [facility];  Service: General;  Laterality: Right;  . RECTOCELE REPAIR Bilateral      OB History   No obstetric history on file.     Family History  Problem Relation Age of Onset  . Colon cancer Mother   . Hypertension Mother   . Heart disease Father   . Meniere's disease Father   . Heart disease Sister   .  Arthritis Son        back  . Glaucoma Son   . Hypertension Son   . Cataracts Son   . Colon cancer Sister   . Alzheimer's disease Sister   . Glaucoma Sister   . Glaucoma Sister   . Hyperlipidemia Sister   . Hypertension Sister   . Cancer Sister        thyroid  . Diabetes Sister   . Glaucoma Sister   . Breast cancer Paternal Aunt   . Breast cancer Paternal Aunt     Social History   Tobacco Use  . Smoking status: Never Smoker  . Smokeless tobacco: Never Used  Vaping Use  . Vaping Use: Never used  Substance Use Topics  . Alcohol use: No  . Drug use: No    Home Medications Prior to Admission medications   Medication Sig Start Date End Date Taking? Authorizing Provider  Artificial Tear Solution (GENTEAL TEARS OP) Place 1 drop into both eyes 2 (two) times daily.    [provider]  aspirin 81 MG EC tablet Take 81 mg by mouth daily. Swallow whole.    [provider]  cholecalciferol (VITAMIN D) 1000 units tablet Take 1 tablet (1,000 Units total) by mouth daily. 05/20/18   Magrinat, Virgie Dad, MD  Difluprednate (DUREZOL OP) Place 2 drops into the right eye daily.     [provider]  Fiber POWD Take 2 Scoops by mouth daily.     [provider]  Multiple Vitamins-Calcium (ONE-A-DAY WITHIN PO) Take 1 tablet by mouth daily.    [provider]  Polyethyl Glycol-Propyl Glycol (SYSTANE OP) Apply to eye.    [provider]  Polyethyl Glycol-Propyl Glycol (SYSTANE) 0.4-0.3 % GEL ophthalmic gel Place 1 application into both eyes at bedtime.    [provider]  Probiotic Product (PROBIOTIC DAILY PO) Take 1 capsule by mouth daily.     [provider]    Allergies    Amlodipine and Doxycycline  Review of Systems   Review of Systems  All other systems reviewed and are negative.   Physical Exam Updated Vital Signs BP (!) 186/70 (BP Location: Left Arm)   Pulse 82   Temp 98.6 F (37 C) (Oral)   Resp 18   Ht 4'  11" (1.499 m)   Wt 58.1 kg   SpO2 97%   BMI 25.85 kg/m   Physical Exam Vitals and nursing note reviewed.  Constitutional:      General: She is not in acute distress.    Appearance: Normal appearance. She is normal weight.  HENT:     Head: Normocephalic and atraumatic.     Nose: Nose normal.     Mouth/Throat:     Mouth: Mucous membranes are moist.  Eyes:     General: Lids are normal.  Right eye: No discharge.     Extraocular Movements: Extraocular movements intact.     Conjunctiva/sclera:     Right eye: Hemorrhage present.     Pupils: Pupils are equal, round, and reactive to light.     Slit lamp exam:    Right eye: No hyphema or photophobia.     Comments: Left eye with opacification of the cornea.  Right eye with reactive pupil and 50% subconjunctival hemorrhage.  No tenderness or swelling around the eye.  Cardiovascular:     Rate and Rhythm: Normal rate.  Pulmonary:     Effort: Pulmonary effort is normal.  Skin:    General: Skin is warm.     Capillary Refill: Capillary refill takes less than 2 seconds.  Neurological:     Mental Status: She is alert. Mental status is at baseline.  Psychiatric:        Mood and Affect: Mood normal.        Behavior: Behavior normal.        Thought Content: Thought content normal.     ED Results / Procedures / Treatments   Labs (all labs ordered are listed, but only abnormal results are displayed) Labs Reviewed - No data to display  EKG None  Radiology No results found.  Procedures Procedures (including critical care time)  Medications Ordered in ED Medications  fluorescein 1 MG ophthalmic strip (has no administration in time range)  tetracaine (PONTOCAINE) 0.5 % ophthalmic solution (has no administration in time range)    ED Course  I have reviewed the triage vital signs and the nursing notes.  Pertinent labs & imaging results that were available during my care of the patient were reviewed by me and considered in my  medical decision making (see chart for details).    MDM Rules/Calculators/A&P                          Patient presenting today with subconjunctival hemorrhage of her right eye.  She is having no vision complaints.  She has never had anything like this and tried to get in to see her eye doctor today but could not so came here for evaluation.  Patient has normal extraocular movements, vision is unchanged and pupil is reactive.  She has no sign of hyphema and denies any direct trauma.  Patient does not take anticoagulation other than 81 mg aspirin.  Patient given information about subconjunctival hemorrhage.  Gave return precautions.  Patient discharged home in good condition.  Final Clinical Impression(s) / ED Diagnoses Final diagnoses:  Subconjunctival hemorrhage, non-traumatic, right    Rx / DC Orders ED Discharge Orders    None       Blanchie Dessert, MD 07/17/20 1813

## 2020-07-17 NOTE — Discharge Instructions (Signed)
You have a broken blood vessel in your eye.  Avoid touching the eye and if you have severe pain or vision changes you need to be seen by an eye doctor.  If you can't be seen by the eye doctor return to the ER

## 2020-08-14 DIAGNOSIS — H3540 Unspecified peripheral retinal degeneration: Secondary | ICD-10-CM | POA: Diagnosis not present

## 2020-08-14 DIAGNOSIS — H35361 Drusen (degenerative) of macula, right eye: Secondary | ICD-10-CM | POA: Diagnosis not present

## 2020-08-14 DIAGNOSIS — H35371 Puckering of macula, right eye: Secondary | ICD-10-CM | POA: Diagnosis not present

## 2020-08-14 DIAGNOSIS — H43811 Vitreous degeneration, right eye: Secondary | ICD-10-CM | POA: Diagnosis not present

## 2020-08-20 DIAGNOSIS — H44522 Atrophy of globe, left eye: Secondary | ICD-10-CM | POA: Diagnosis not present

## 2020-08-20 DIAGNOSIS — H401113 Primary open-angle glaucoma, right eye, severe stage: Secondary | ICD-10-CM | POA: Diagnosis not present

## 2020-08-20 DIAGNOSIS — H18422 Band keratopathy, left eye: Secondary | ICD-10-CM | POA: Diagnosis not present

## 2020-08-20 DIAGNOSIS — Z961 Presence of intraocular lens: Secondary | ICD-10-CM | POA: Diagnosis not present

## 2020-10-29 DIAGNOSIS — H1820 Unspecified corneal edema: Secondary | ICD-10-CM | POA: Diagnosis not present

## 2020-10-29 DIAGNOSIS — H44522 Atrophy of globe, left eye: Secondary | ICD-10-CM | POA: Diagnosis not present

## 2020-10-29 DIAGNOSIS — H401113 Primary open-angle glaucoma, right eye, severe stage: Secondary | ICD-10-CM | POA: Diagnosis not present

## 2020-10-29 DIAGNOSIS — H18422 Band keratopathy, left eye: Secondary | ICD-10-CM | POA: Diagnosis not present

## 2020-11-05 DIAGNOSIS — H35361 Drusen (degenerative) of macula, right eye: Secondary | ICD-10-CM | POA: Diagnosis not present

## 2020-11-05 DIAGNOSIS — H3540 Unspecified peripheral retinal degeneration: Secondary | ICD-10-CM | POA: Diagnosis not present

## 2020-11-05 DIAGNOSIS — H35371 Puckering of macula, right eye: Secondary | ICD-10-CM | POA: Diagnosis not present

## 2020-11-05 DIAGNOSIS — H35351 Cystoid macular degeneration, right eye: Secondary | ICD-10-CM | POA: Diagnosis not present

## 2020-11-28 ENCOUNTER — Encounter: Payer: Self-pay | Admitting: Gastroenterology

## 2020-11-28 ENCOUNTER — Encounter: Payer: Self-pay | Admitting: Family Medicine

## 2020-11-28 ENCOUNTER — Other Ambulatory Visit: Payer: Self-pay

## 2020-11-28 ENCOUNTER — Ambulatory Visit (INDEPENDENT_AMBULATORY_CARE_PROVIDER_SITE_OTHER): Payer: Medicare HMO | Admitting: Family Medicine

## 2020-11-28 VITALS — BP 136/80 | HR 79 | Temp 97.9°F | Resp 16 | Ht 62.0 in | Wt 125.2 lb

## 2020-11-28 DIAGNOSIS — E785 Hyperlipidemia, unspecified: Secondary | ICD-10-CM | POA: Diagnosis not present

## 2020-11-28 DIAGNOSIS — Z0000 Encounter for general adult medical examination without abnormal findings: Secondary | ICD-10-CM | POA: Diagnosis not present

## 2020-11-28 DIAGNOSIS — R3915 Urgency of urination: Secondary | ICD-10-CM | POA: Diagnosis not present

## 2020-11-28 DIAGNOSIS — R739 Hyperglycemia, unspecified: Secondary | ICD-10-CM | POA: Diagnosis not present

## 2020-11-28 DIAGNOSIS — I10 Essential (primary) hypertension: Secondary | ICD-10-CM | POA: Diagnosis not present

## 2020-11-28 DIAGNOSIS — M8580 Other specified disorders of bone density and structure, unspecified site: Secondary | ICD-10-CM

## 2020-11-28 DIAGNOSIS — R194 Change in bowel habit: Secondary | ICD-10-CM

## 2020-11-28 DIAGNOSIS — R7989 Other specified abnormal findings of blood chemistry: Secondary | ICD-10-CM | POA: Diagnosis not present

## 2020-11-28 DIAGNOSIS — E2839 Other primary ovarian failure: Secondary | ICD-10-CM

## 2020-11-28 DIAGNOSIS — K6289 Other specified diseases of anus and rectum: Secondary | ICD-10-CM

## 2020-11-28 DIAGNOSIS — K921 Melena: Secondary | ICD-10-CM

## 2020-11-28 DIAGNOSIS — Z780 Asymptomatic menopausal state: Secondary | ICD-10-CM

## 2020-11-28 DIAGNOSIS — R1031 Right lower quadrant pain: Secondary | ICD-10-CM

## 2020-11-28 LAB — POC URINALSYSI DIPSTICK (AUTOMATED)
Bilirubin, UA: NEGATIVE
Blood, UA: NEGATIVE
Glucose, UA: NEGATIVE
Ketones, UA: NEGATIVE
Nitrite, UA: NEGATIVE
Protein, UA: NEGATIVE
Spec Grav, UA: 1.02 (ref 1.010–1.025)
Urobilinogen, UA: 0.2 E.U./dL
pH, UA: 5.5 (ref 5.0–8.0)

## 2020-11-28 LAB — URINALYSIS, ROUTINE W REFLEX MICROSCOPIC
Bilirubin Urine: NEGATIVE
Hgb urine dipstick: NEGATIVE
Ketones, ur: NEGATIVE
Nitrite: NEGATIVE
RBC / HPF: NONE SEEN (ref 0–?)
Specific Gravity, Urine: 1.015 (ref 1.000–1.030)
Total Protein, Urine: NEGATIVE
Urine Glucose: NEGATIVE
Urobilinogen, UA: 0.2 (ref 0.0–1.0)
pH: 6 (ref 5.0–8.0)

## 2020-11-28 LAB — COMPREHENSIVE METABOLIC PANEL
ALT: 15 U/L (ref 0–35)
AST: 16 U/L (ref 0–37)
Albumin: 4.2 g/dL (ref 3.5–5.2)
Alkaline Phosphatase: 58 U/L (ref 39–117)
BUN: 26 mg/dL — ABNORMAL HIGH (ref 6–23)
CO2: 31 mEq/L (ref 19–32)
Calcium: 10.5 mg/dL (ref 8.4–10.5)
Chloride: 101 mEq/L (ref 96–112)
Creatinine, Ser: 1 mg/dL (ref 0.40–1.20)
GFR: 49.28 mL/min — ABNORMAL LOW (ref >60.00)
Glucose, Bld: 101 mg/dL — ABNORMAL HIGH (ref 70–99)
Potassium: 4.2 mEq/L (ref 3.5–5.1)
Sodium: 139 mEq/L (ref 135–145)
Total Bilirubin: 0.5 mg/dL (ref 0.2–1.2)
Total Protein: 6.7 g/dL (ref 6.0–8.3)

## 2020-11-28 LAB — LIPID PANEL
Cholesterol: 169 mg/dL (ref 0–200)
HDL: 64.9 mg/dL (ref >39.00)
LDL Cholesterol: 86 mg/dL (ref 0–99)
NonHDL: 103.99
Total CHOL/HDL Ratio: 3
Triglycerides: 90 mg/dL (ref 0.0–149.0)
VLDL: 18 mg/dL (ref 0.0–40.0)

## 2020-11-28 LAB — HEMOGLOBIN A1C: Hgb A1c MFr Bld: 6.1 % (ref 4.6–6.5)

## 2020-11-28 LAB — CBC
HCT: 37.4 % (ref 36.0–46.0)
Hemoglobin: 12.5 g/dL (ref 12.0–15.0)
MCHC: 33.5 g/dL (ref 30.0–36.0)
MCV: 95 fl (ref 78.0–100.0)
Platelets: 226 10*3/uL (ref 150.0–400.0)
RBC: 3.94 Mil/uL (ref 3.87–5.11)
RDW: 13.1 % (ref 11.5–15.5)
WBC: 6.8 10*3/uL (ref 4.0–10.5)

## 2020-11-28 LAB — TSH: TSH: 3.21 u[IU]/mL (ref 0.35–4.50)

## 2020-11-28 NOTE — Assessment & Plan Note (Signed)
Well controlled, no changes to meds. Encouraged heart healthy diet such as the DASH diet and exercise as tolerated.  °

## 2020-11-28 NOTE — Patient Instructions (Signed)
Preventive Care 85 Years and Older, Female Preventive care refers to lifestyle choices and visits with your health care provider that can promote health and wellness. This includes:  A yearly physical exam. This is also called an annual wellness visit.  Regular dental and eye exams.  Immunizations.  Screening for certain conditions.  Healthy lifestyle choices, such as: ? Eating a healthy diet. ? Getting regular exercise. ? Not using drugs or products that contain nicotine and tobacco. ? Limiting alcohol use. What can I expect for my preventive care visit? Physical exam Your health care provider will check your:  Height and weight. These may be used to calculate your BMI (body mass index). BMI is a measurement that tells if you are at a healthy weight.  Heart rate and blood pressure.  Body temperature.  Skin for abnormal spots. Counseling Your health care provider may ask you questions about your:  Past medical problems.  Family's medical history.  Alcohol, tobacco, and drug use.  Emotional well-being.  Home life and relationship well-being.  Sexual activity.  Diet, exercise, and sleep habits.  History of falls.  Memory and ability to understand (cognition).  Work and work Statistician.  Pregnancy and menstrual history.  Access to firearms. What immunizations do I need? Vaccines are usually given at various ages, according to a schedule. Your health care provider will recommend vaccines for you based on your age, medical history, and lifestyle or other factors, such as travel or where you work.   What tests do I need? Blood tests  Lipid and cholesterol levels. These may be checked every 5 years, or more often depending on your overall health.  Hepatitis C test.  Hepatitis B test. Screening  Lung cancer screening. You may have this screening every year starting at age 85 if you have a 30-pack-year history of smoking and currently smoke or have quit within  the past 15 years.  Colorectal cancer screening. ? All adults should have this screening starting at age 85 and continuing until age 85. ? Your health care provider may recommend screening at age 85 if you are at increased risk. ? You will have tests every 1-10 years, depending on your results and the type of screening test.  Diabetes screening. ? This is done by checking your blood sugar (glucose) after you have not eaten for a while (fasting). ? You may have this done every 1-3 years.  Mammogram. ? This may be done every 1-2 years. ? Talk with your health care provider about how often you should have regular mammograms.  Abdominal aortic aneurysm (AAA) screening. You may need this if you are a current or former smoker.  BRCA-related cancer screening. This may be done if you have a family history of breast, ovarian, tubal, or peritoneal cancers. Other tests  STD (sexually transmitted disease) testing, if you are at risk.  Bone density scan. This is done to screen for osteoporosis. You may have this done starting at age 85. Talk with your health care provider about your test results, treatment options, and if necessary, the need for more tests. Follow these instructions at home: Eating and drinking  Eat a diet that includes fresh fruits and vegetables, whole grains, lean protein, and low-fat dairy products. Limit your intake of foods with high amounts of sugar, saturated fats, and salt.  Take vitamin and mineral supplements as recommended by your health care provider.  Do not drink alcohol if your health care provider tells you not to drink.  If you drink alcohol: ? Limit how much you have to 0-1 drink a day. ? Be aware of how much alcohol is in your drink. In the [place] one drink equals one 12 oz bottle of beer (355 mL), one 5 oz glass of wine (148 mL), or one 1 oz glass of hard liquor (44 mL).   Lifestyle  Take daily care of your teeth and gums. Brush your teeth every morning  and night with fluoride toothpaste. Floss one time each day.  Stay active. Exercise for at least 30 minutes 5 or more days each week.  Do not use any products that contain nicotine or tobacco, such as cigarettes, e-cigarettes, and chewing tobacco. If you need help quitting, ask your health care provider.  Do not use drugs.  If you are sexually active, practice safe sex. Use a condom or other form of protection in order to prevent STIs (sexually transmitted infections).  Talk with your health care provider about taking a low-dose aspirin or statin.  Find healthy ways to cope with stress, such as: ? Meditation, yoga, or listening to music. ? Journaling. ? Talking to a trusted person. ? Spending time with friends and family. Safety  Always wear your seat belt while driving or riding in a vehicle.  Do not drive: ? If you have been drinking alcohol. Do not ride with someone who has been drinking. ? When you are tired or distracted. ? While texting.  Wear a helmet and other protective equipment during sports activities.  If you have firearms in your house, make sure you follow all gun safety procedures. What's next?  Visit your health care provider once a year for an annual wellness visit.  Ask your health care provider how often you should have your eyes and teeth checked.  Stay up to date on all vaccines. This information is not intended to replace advice given to you by your health care provider. Make sure you discuss any questions you have with your health care provider. Document Revised: 09/11/2020 Document Reviewed: 09/15/2018 Elsevier Patient Education  2021 [employer] Inc.  

## 2020-11-28 NOTE — Assessment & Plan Note (Signed)
Tolerating statin, encouraged heart healthy diet, avoid trans fats, minimize simple carbs and saturated fats. Increase exercise as tolerated 

## 2020-11-28 NOTE — Assessment & Plan Note (Signed)
hgba1c acceptable, minimize simple carbs. Increase exercise as tolerated.  

## 2020-11-30 LAB — URINE CULTURE
MICRO NUMBER:: 11575008
SPECIMEN QUALITY:: ADEQUATE

## 2020-11-30 LAB — PARATHYROID HORMONE, INTACT (NO CA): PTH: 60 pg/mL (ref 14–64)

## 2020-12-01 ENCOUNTER — Other Ambulatory Visit: Payer: Self-pay | Admitting: Family Medicine

## 2020-12-01 MED ORDER — AMOXICILLIN 500 MG PO CAPS: 500.0000 mg | ORAL_CAPSULE | Freq: Three times a day (TID) | ORAL | 0 refills | Status: DC

## 2020-12-01 NOTE — Assessment & Plan Note (Signed)
She has had a right inguinal hernia repaired and this has improved.

## 2020-12-01 NOTE — Assessment & Plan Note (Signed)
With change in bowel habits. And rectal discomfort. She notes her bowels have been darker recently but her CBC is normal. She was struggling with constipation and straining but when she increased Benefiber from 2 days to 3 doses a day. She reports she has struggled with some layer of pain or pressure since she had what sounds like a rectocele repair back in 2003. She notes the pain got much better after the surgery but has worsened some recently. She is referred to [facility] for evaluation and records of last colonoscopy in 2017 is requested

## 2020-12-01 NOTE — Assessment & Plan Note (Signed)
Patient encouraged to maintain heart healthy diet, regular exercise, adequate sleep. Consider daily probiotics. Take medications as prescribed. Labs ordered and reviewed. MGM UTD done 03/2020. Last colonoscopy in 2017 in [place] but report not accessible. Will request a copy.

## 2020-12-01 NOTE — Assessment & Plan Note (Signed)
Encouraged to get adequate exercise, calcium and vitamin d intake. New dexa scan ordered today 

## 2020-12-01 NOTE — Progress Notes (Signed)
Subjective:    Patient ID: Amber Morgan, female    DOB: 1927/09/22, 87 y.o.   MRN: [id number]  Chief Complaint  Patient presents with  . Annual Exam    HPI Patient is in today for annual preventative exam and follow up on chronic medical concerns. No recent febrile illness or hospitalizations. She notes change in bowel habits. And rectal discomfort. She notes her bowels have been darker recently but her CBC is normal. She was struggling with constipation and straining but when she increased Benefiber from 2 days to 3 doses a day. She reports she has struggled with some layer of pain or pressure since she had what sounds like a rectocele repair back in 2003. She notes the pain got much better after the surgery but has worsened some recently. She tries to maintain heart healthy diet and to stay active.   Past Medical History:  Diagnosis Date  . Allergy   . Arthritis   . Cancer [facility])    breast cancer   . Colon polyps   . Diverticulitis   . Diverticulosis 01/04/2014  . Glaucoma   . Hernia, inguinal, right   . History of chicken pox    childhood  . History of hiatal hernia   . HTN (hypertension)   . Hyperglycemia 09/05/2013  . Lipoma of arm 12/09/2014   right  . Nocturia 02/08/2013  . Osteopenia 12/09/2014  . Personal history of colonic polyps 09/05/2013  . Pre-diabetes   . Preventative health care 08/14/2016  . Unspecified constipation 02/06/2013  . Urinary incontinence   . Viral infection characterized by skin and mucous membrane lesions 05/11/2013  . Wears glasses     Past Surgical History:  Procedure Laterality Date  . ABDOMINAL HYSTERECTOMY    . APPENDECTOMY    . BLADDER SURGERY  2003   bladder tact  . BREAST BIOPSY Right 03/2018  . BREAST LUMPECTOMY Right 04/2018  . BREAST LUMPECTOMY WITH RADIOACTIVE SEED LOCALIZATION Right 04/12/2018   Procedure: BREAST LUMPECTOMY WITH RADIOACTIVE SEED LOCALIZATION;  Surgeon: [name], MD;  Location: [place];  Service: General;  Laterality:  Right;  . CATARACT EXTRACTION, BILATERAL    . COLONOSCOPY W/ BIOPSIES AND POLYPECTOMY    . EYE SURGERY     multiple bilateral  . INGUINAL HERNIA REPAIR Right 04/12/2018   Procedure: OPEN RIGHT INGUINAL HERNIA REPAIR WITH MESH;  Surgeon: [name], MD;  Location: [place];  Service: General;  Laterality: Right;  . RECTOCELE REPAIR Bilateral     Family History  Problem Relation Age of Onset  . Colon cancer Mother   . Hypertension Mother   . Heart disease Father   . Meniere's disease Father   . Heart disease Sister   . Arthritis Son        back  . Glaucoma Son   . Hypertension Son   . Cataracts Son   . Colon cancer Sister   . Alzheimer's disease Sister   . Glaucoma Sister   . Glaucoma Sister   . Hyperlipidemia Sister   . Hypertension Sister   . Cancer Sister        thyroid  . Diabetes Sister   . Glaucoma Sister   . Breast cancer Paternal Aunt   . Breast cancer Paternal Aunt     Social History   Socioeconomic History  . Marital status: Married    Spouse name: Not on file  . Number of children: 4  . Years of education: Not on file  .  Highest education level: Not on file  Occupational History  . Not on file  Tobacco Use  . Smoking status: Never Smoker  . Smokeless tobacco: Never Used  Vaping Use  . Vaping Use: Never used  Substance and Sexual Activity  . Alcohol use: No  . Drug use: No  . Sexual activity: Not Currently  Other Topics Concern  . Not on file  Social History Narrative  . Not on file   Social Determinants of Health   Financial Resource Strain: Not on file  Food Insecurity: Not on file  Transportation Needs: Not on file  Physical Activity: Not on file  Stress: Not on file  Social Connections: Not on file  Intimate Partner Violence: Not on file    Outpatient Medications Prior to Visit  Medication Sig Dispense Refill  . Artificial Tear Solution (GENTEAL TEARS OP) Place 1 drop into both eyes 2 (two) times daily.    Marland Kitchen aspirin 81 MG EC tablet  Take 81 mg by mouth daily. Swallow whole.    . cholecalciferol (VITAMIN D) 1000 units tablet Take 1 tablet (1,000 Units total) by mouth daily.    . Difluprednate (DUREZOL OP) Place 2 drops into the right eye daily.     . Fiber POWD Take 2 Scoops by mouth daily.     Marland Kitchen ketorolac (ACULAR) 0.5 % ophthalmic solution 1 drop 4 (four) times daily.    . Multiple Vitamins-Calcium (ONE-A-DAY WITHIN PO) Take 1 tablet by mouth daily.    Vladimir Faster Glycol-Propyl Glycol (SYSTANE OP) Apply to eye.    Vladimir Faster Glycol-Propyl Glycol (SYSTANE) 0.4-0.3 % GEL ophthalmic gel Place 1 application into both eyes at bedtime.    . Probiotic Product (PROBIOTIC DAILY PO) Take 1 capsule by mouth daily.     . sodium chloride (MURO 128) 5 % ophthalmic solution 1 drop as needed for eye irritation.     No facility-administered medications prior to visit.    Allergies  Allergen Reactions  . Amlodipine Other (See Comments)    Mental status chnages ie confusion  . Doxycycline Rash    Review of Systems  Constitutional: Negative for chills, fever and malaise/fatigue.  HENT: Negative for congestion and hearing loss.   Eyes: Negative for discharge.  Respiratory: Negative for cough, sputum production and shortness of breath.   Cardiovascular: Negative for chest pain, palpitations and leg swelling.  Gastrointestinal: Positive for constipation. Negative for abdominal pain, blood in stool, diarrhea, heartburn, nausea and vomiting.  Genitourinary: Negative for dysuria, frequency, hematuria and urgency.  Musculoskeletal: Negative for back pain, falls and myalgias.  Skin: Negative for rash.  Neurological: Negative for dizziness, sensory change, loss of consciousness, weakness and headaches.  Endo/Heme/Allergies: Negative for environmental allergies. Does not bruise/bleed easily.  Psychiatric/Behavioral: Negative for depression and suicidal ideas. The patient is not nervous/anxious and does not have insomnia.         Objective:    Physical Exam Constitutional:      General: She is not in acute distress.    Appearance: She is well-developed and well-nourished.  HENT:     Head: Normocephalic and atraumatic.  Eyes:     Conjunctiva/sclera: Conjunctivae normal.  Neck:     Thyroid: No thyromegaly.  Cardiovascular:     Rate and Rhythm: Normal rate and regular rhythm.     Heart sounds: Normal heart sounds. No murmur heard.   Pulmonary:     Effort: Pulmonary effort is normal. No respiratory distress.     Breath sounds:  Normal breath sounds.  Abdominal:     General: Bowel sounds are normal. There is no distension.     Palpations: Abdomen is soft. There is no mass.     Tenderness: There is no abdominal tenderness.  Musculoskeletal:        General: No edema.     Cervical back: Neck supple.  Lymphadenopathy:     Cervical: No cervical adenopathy.  Skin:    General: Skin is warm and dry.  Neurological:     Mental Status: She is alert and oriented to person, place, and time.  Psychiatric:        Mood and Affect: Mood and affect normal.        Behavior: Behavior normal.     BP 136/80   Pulse 79   Temp 97.9 F (36.6 C)   Resp 16   Ht 5\' 2"  (1.575 m)   Wt 125 lb 3.2 oz (56.8 kg)   SpO2 97%   BMI 22.90 kg/m  Wt Readings from Last 3 Encounters:  11/28/20 125 lb 3.2 oz (56.8 kg)  07/17/20 128 lb (58.1 kg)  05/27/20 127 lb (57.6 kg)    Diabetic Foot Exam - Simple   No data filed    Lab Results  Component Value Date   WBC 6.8 11/28/2020   HGB 12.5 11/28/2020   HCT 37.4 11/28/2020   PLT 226.0 11/28/2020   GLUCOSE 101 (H) 11/28/2020   CHOL 169 11/28/2020   TRIG 90.0 11/28/2020   HDL 64.90 11/28/2020   LDLCALC 86 11/28/2020   ALT 15 11/28/2020   Morgan 16 11/28/2020   NA 139 11/28/2020   K 4.2 11/28/2020   CL 101 11/28/2020   CREATININE 1.00 11/28/2020   BUN 26 (H) 11/28/2020   CO2 31 11/28/2020   TSH 3.21 11/28/2020   HGBA1C 6.1 11/28/2020   MICROALBUR 0.8 08/06/2016    Lab  Results  Component Value Date   TSH 3.21 11/28/2020   Lab Results  Component Value Date   WBC 6.8 11/28/2020   HGB 12.5 11/28/2020   HCT 37.4 11/28/2020   MCV 95.0 11/28/2020   PLT 226.0 11/28/2020   Lab Results  Component Value Date   NA 139 11/28/2020   K 4.2 11/28/2020   CO2 31 11/28/2020   GLUCOSE 101 (H) 11/28/2020   BUN 26 (H) 11/28/2020   CREATININE 1.00 11/28/2020   BILITOT 0.5 11/28/2020   ALKPHOS 58 11/28/2020   Morgan 16 11/28/2020   ALT 15 11/28/2020   PROT 6.7 11/28/2020   ALBUMIN 4.2 11/28/2020   CALCIUM 10.5 11/28/2020   ANIONGAP 6 05/06/2020   GFR 49.28 (L) 11/28/2020   Lab Results  Component Value Date   CHOL 169 11/28/2020   Lab Results  Component Value Date   HDL 64.90 11/28/2020   Lab Results  Component Value Date   LDLCALC 86 11/28/2020   Lab Results  Component Value Date   TRIG 90.0 11/28/2020   Lab Results  Component Value Date   CHOLHDL 3 11/28/2020   Lab Results  Component Value Date   HGBA1C 6.1 11/28/2020       Assessment & Plan:   Problem List Items Addressed This Visit    HTN (hypertension)    Well controlled, no changes to meds. Encouraged heart healthy diet such as the DASH diet and exercise as tolerated.       Relevant Orders   CBC (Completed)   TSH (Completed)   Hyperlipidemia  Tolerating statin, encouraged heart healthy diet, avoid trans fats, minimize simple carbs and saturated fats. Increase exercise as tolerated      Relevant Orders   Lipid panel (Completed)   Hyperglycemia    hgba1c acceptable, minimize simple carbs. Increase exercise as tolerated.       Relevant Orders   Hemoglobin A1c (Completed)   Comprehensive metabolic panel (Completed)   Osteopenia    Encouraged to get adequate exercise, calcium and vitamin d intake. New dexa scan ordered today.      Relevant Orders   DG Bone Density   Preventative health care    Patient encouraged to maintain heart healthy diet, regular exercise, adequate  sleep. Consider daily probiotics. Take medications as prescribed. Labs ordered and reviewed. MGM UTD done 03/2020. Last colonoscopy in 2017 in [place] but report not accessible. Will request a copy.       Right lower quadrant abdominal pain    She has had a right inguinal hernia repaired and this has improved.        Other Visit Diagnoses    Rectal pain    -  Primary   Relevant Orders   Ambulatory referral to Gastroenterology   Change in bowel habits       Relevant Orders   Ambulatory referral to Gastroenterology   High serum parathyroid hormone (PTH)       Relevant Orders   PTH, intact (no Ca) (Completed)   Urinary urgency       Relevant Orders   Urinalysis   POCT Urinalysis Dipstick (Automated) (Completed)   Urine Culture (Completed)   Estrogen deficiency       Relevant Orders   DG Bone Density   Post-menopausal       Relevant Orders   DG Bone Density   Black stool       Relevant Orders   Fecal occult blood, imunochemical      I am having Amber Morgan maintain her Probiotic Product (PROBIOTIC DAILY PO), Artificial Tear Solution (GENTEAL TEARS OP), Fiber, cholecalciferol, Polyethyl Glycol-Propyl Glycol (SYSTANE OP), Systane, Difluprednate (DUREZOL OP), Multiple Vitamins-Calcium (ONE-A-DAY WITHIN PO), aspirin, ketorolac, and sodium chloride.  No orders of the defined types were placed in this encounter.    Penni Homans, MD

## 2020-12-03 ENCOUNTER — Telehealth: Payer: Self-pay

## 2020-12-03 DIAGNOSIS — H3540 Unspecified peripheral retinal degeneration: Secondary | ICD-10-CM | POA: Diagnosis not present

## 2020-12-03 DIAGNOSIS — H35361 Drusen (degenerative) of macula, right eye: Secondary | ICD-10-CM | POA: Diagnosis not present

## 2020-12-03 DIAGNOSIS — H35351 Cystoid macular degeneration, right eye: Secondary | ICD-10-CM | POA: Diagnosis not present

## 2020-12-03 DIAGNOSIS — H35371 Puckering of macula, right eye: Secondary | ICD-10-CM | POA: Diagnosis not present

## 2020-12-03 NOTE — Addendum Note (Signed)
Addended by: Kelle Darting A on: 12/03/2020 01:14 PM   Modules accepted: Orders

## 2020-12-03 NOTE — Telephone Encounter (Signed)
Left message for patient to return call to lab she will need to repeat IFOB per [facility] and will need to pick up a kit for repeat test.

## 2020-12-05 ENCOUNTER — Other Ambulatory Visit: Payer: Medicare HMO

## 2020-12-05 ENCOUNTER — Other Ambulatory Visit (INDEPENDENT_AMBULATORY_CARE_PROVIDER_SITE_OTHER): Payer: Medicare HMO

## 2020-12-05 DIAGNOSIS — K921 Melena: Secondary | ICD-10-CM

## 2020-12-05 LAB — FECAL OCCULT BLOOD, IMMUNOCHEMICAL: Fecal Occult Bld: NEGATIVE

## 2020-12-06 ENCOUNTER — Encounter: Payer: Self-pay | Admitting: *Deleted

## 2020-12-06 ENCOUNTER — Telehealth: Payer: Self-pay

## 2020-12-06 ENCOUNTER — Telehealth: Payer: Self-pay | Admitting: Family Medicine

## 2020-12-06 NOTE — Telephone Encounter (Signed)
Patient was calling back in response to message left for lab results and informed that her Ifob came back negative for blood. Patient verbalized understanding with no further questions.

## 2020-12-06 NOTE — Telephone Encounter (Signed)
Left detailed message on machine that labs were mailed out

## 2020-12-06 NOTE — Telephone Encounter (Signed)
Patient states she would her recent lab results mail to her home address

## 2020-12-10 ENCOUNTER — Encounter: Payer: Self-pay | Admitting: *Deleted

## 2020-12-19 ENCOUNTER — Other Ambulatory Visit: Payer: Self-pay

## 2020-12-19 ENCOUNTER — Telehealth: Payer: Self-pay

## 2020-12-19 ENCOUNTER — Ambulatory Visit (HOSPITAL_BASED_OUTPATIENT_CLINIC_OR_DEPARTMENT_OTHER)
Admission: RE | Admit: 2020-12-19 | Discharge: 2020-12-19 | Disposition: A | Discharge status: Home / Self Care | Payer: Medicare HMO | Source: Ambulatory Visit | Attending: Family Medicine | Admitting: Family Medicine

## 2020-12-19 ENCOUNTER — Ambulatory Visit (INDEPENDENT_AMBULATORY_CARE_PROVIDER_SITE_OTHER): Payer: Medicare HMO | Admitting: Gastroenterology

## 2020-12-19 ENCOUNTER — Encounter: Payer: Self-pay | Admitting: Gastroenterology

## 2020-12-19 VITALS — BP 124/78 | HR 88 | Ht 60.0 in | Wt 126.1 lb

## 2020-12-19 DIAGNOSIS — Z780 Asymptomatic menopausal state: Secondary | ICD-10-CM | POA: Diagnosis not present

## 2020-12-19 DIAGNOSIS — K640 First degree hemorrhoids: Secondary | ICD-10-CM | POA: Diagnosis not present

## 2020-12-19 DIAGNOSIS — M8580 Other specified disorders of bone density and structure, unspecified site: Secondary | ICD-10-CM | POA: Insufficient documentation

## 2020-12-19 DIAGNOSIS — K5900 Constipation, unspecified: Secondary | ICD-10-CM

## 2020-12-19 DIAGNOSIS — E2839 Other primary ovarian failure: Secondary | ICD-10-CM | POA: Diagnosis not present

## 2020-12-19 DIAGNOSIS — K6289 Other specified diseases of anus and rectum: Secondary | ICD-10-CM | POA: Diagnosis not present

## 2020-12-19 DIAGNOSIS — M8589 Other specified disorders of bone density and structure, multiple sites: Secondary | ICD-10-CM | POA: Diagnosis not present

## 2020-12-19 NOTE — Patient Instructions (Addendum)
If you are age 85 or older, your body mass index should be between 23-30. Your Body mass index is 24.63 kg/m. If this is out of the aforementioned range listed, please consider follow up with your Primary Care Provider.  If you are age 40 or younger, your body mass index should be between 19-25. Your Body mass index is 24.63 kg/m. If this is out of the aformentioned range listed, please consider follow up with your Primary Care Provider.   Take Miralax 1 capful mixed in 8 ounces of water at bed time for constipation as tolerated.  Use over the counter Recticare twice daily as needed.  Due to recent changes in healthcare laws, you may see the results of your imaging and laboratory studies on MyChart before your provider has had a chance to review them.  We understand that in some cases there may be results that are confusing or concerning to you. Not all laboratory results come back in the same time frame and the provider may be waiting for multiple results in order to interpret others.  Please give [place] 48 hours in order for your provider to thoroughly review all the results before contacting the office for clarification of your results.   Return to the clinic as needed.  Thank you for choosing me and [facility].  Vito Cirigliano, D.O.

## 2020-12-19 NOTE — Progress Notes (Signed)
Chief Complaint: Rectal pain, change in bowel habits, constipation   Referring Provider:     Mosie Lukes, MD   HPI:     Amber Morgan is a 87 y.o. female with a history of HTN, HLD, diverticulosis, history of breast cancer, referred to the Gastroenterology Clinic for evaluation of rectal pain and constipation.  Has been having constipation for the last 3 years or so, which she mainly treats with fiber supplement, but no stool softeners or laxatives. Was having BM daily until the last 3 months, and now decreased volume stools. She describes intermittent rectal pain for the last 6 months or so- always with a baseline level of rectal discomfort with episodes of sharp pain that are not related to BMs.   No abdominal pain, n/v/f/c.   Has increased Benefiber to TID without change.  Previously followed at [facility], last seen on 12/31/2015 for evaluation of constipation and narrow caliber stools.  Due to change in stools and reported family history of mother being diagnosed with colon cancer in her late 72s, she was referred for repeat colonoscopy, which appears to been completed in 01/2016, but no report available for review.  History of bladder tack and rectocele repair in 2003.  Right inguinal hernia repair in 2019.   Labs in 11/2020: Negative FOBT, normal CBC, normal TSH, BUN 26 otherwise normal CMP   Endoscopic History: -Colonoscopy (10/2004): Diverticulosis, 2 tubulovillous adenomas -Colonoscopy (11/2008): Diverticulosis -Colonoscopy (12/2013): Diverticulosis, one tubular adenoma -Colonoscopy (01/2016): No report available for review.   Past Medical History:  Diagnosis Date  . Allergy   . Arthritis   . Blind right eye   . Cancer [facility])    breast cancer   . Colon polyps   . Diverticulitis   . Diverticulosis 01/04/2014  . Glaucoma   . Hernia, inguinal, right   . History of chicken pox    childhood  . History of hiatal hernia   . HTN (hypertension)   .  Hyperglycemia 09/05/2013  . Lipoma of arm 12/09/2014   right  . Nocturia 02/08/2013  . Osteopenia 12/09/2014  . Personal history of colonic polyps 09/05/2013  . Pre-diabetes   . Preventative health care 08/14/2016  . Unspecified constipation 02/06/2013  . Urinary incontinence   . Viral infection characterized by skin and mucous membrane lesions 05/11/2013  . Wears glasses      Past Surgical History:  Procedure Laterality Date  . ABDOMINAL HYSTERECTOMY    . APPENDECTOMY    . BLADDER SURGERY  2003   bladder tact  . BREAST BIOPSY Right 03/2018  . BREAST LUMPECTOMY Right 04/2018  . BREAST LUMPECTOMY WITH RADIOACTIVE SEED LOCALIZATION Right 04/12/2018   Procedure: BREAST LUMPECTOMY WITH RADIOACTIVE SEED LOCALIZATION;  Surgeon: [name], MD;  Location: [place];  Service: General;  Laterality: Right;  . CATARACT EXTRACTION, BILATERAL    . COLONOSCOPY W/ BIOPSIES AND POLYPECTOMY    . EYE SURGERY     multiple bilateral  . INGUINAL HERNIA REPAIR Right 04/12/2018   Procedure: OPEN RIGHT INGUINAL HERNIA REPAIR WITH MESH;  Surgeon: [name], MD;  Location: [place];  Service: General;  Laterality: Right;  . RECTOCELE REPAIR Bilateral    Family History  Problem Relation Age of Onset  . Colon cancer Mother   . Hypertension Mother   . Heart disease Father   . Meniere's disease Father   . Heart disease Sister   .  Arthritis Son        back  . Glaucoma Son   . Hypertension Son   . Cataracts Son   . Colon cancer Sister   . Alzheimer's disease Sister   . Glaucoma Sister   . Glaucoma Sister   . Hyperlipidemia Sister   . Hypertension Sister   . Cancer Sister        thyroid  . Diabetes Sister   . Glaucoma Sister   . Breast cancer Paternal Aunt   . Breast cancer Paternal Aunt    Social History   Tobacco Use  . Smoking status: Never Smoker  . Smokeless tobacco: Never Used  Vaping Use  . Vaping Use: Never used  Substance Use Topics  . Alcohol use: No  . Drug use: No   Current  Outpatient Medications  Medication Sig Dispense Refill  . aspirin 81 MG EC tablet Take 81 mg by mouth daily. Swallow whole.    . cholecalciferol (VITAMIN D) 1000 units tablet Take 1 tablet (1,000 Units total) by mouth daily.    . Difluprednate (DUREZOL OP) Place 1 drop into the right eye 2 (two) times daily.    . Fiber POWD Take 2 Scoops by mouth 3 (three) times daily. 2 teaspoon 3 times daily    . ketorolac (ACULAR) 0.5 % ophthalmic solution Place 1 drop into the right eye 2 (two) times daily.    . Multiple Vitamins-Calcium (ONE-A-DAY WITHIN PO) Take 1 tablet by mouth daily.    Vladimir Faster Glycol-Propyl Glycol (SYSTANE OP) Apply 1 drop to eye 3 (three) times daily. Put in both eyes    . sodium chloride (MURO 128) 5 % ophthalmic ointment Place 1 application into the right eye at bedtime.    . sodium chloride (MURO 128) 5 % ophthalmic solution Place 1 drop into the right eye 2 (two) times daily. Use first then 10 minutes later uses Ketorolac    . Probiotic Product (PROBIOTIC DAILY PO) Take 1 capsule by mouth daily.      No current facility-administered medications for this visit.   Allergies  Allergen Reactions  . Amlodipine Other (See Comments)    Mental status chnages ie confusion  . Doxycycline Rash     Review of Systems: All systems reviewed and negative except where noted in HPI.     Physical Exam:    Wt Readings from Last 3 Encounters:  12/19/20 126 lb 2 oz (57.2 kg)  11/28/20 125 lb 3.2 oz (56.8 kg)  07/17/20 128 lb (58.1 kg)    BP 124/78   Pulse 88   Ht 5' (1.524 m)   Wt 126 lb 2 oz (57.2 kg)   BMI 24.63 kg/m  Constitutional:  Pleasant, in no acute distress. Psychiatric: Normal mood and affect. Behavior is normal. EENT: Pupils normal.  Conjunctivae are normal. No scleral icterus. Neck supple. No cervical LAD. Cardiovascular: Normal rate, regular rhythm. No edema Pulmonary/chest: Effort normal and breath sounds normal. No wheezing, rales or rhonchi. Abdominal:  Soft, nondistended, nontender. Bowel sounds active throughout. There are no masses palpable. No hepatomegaly. Neurological: Alert and oriented to person place and time. Skin: Skin is warm and dry. No rashes noted. Rectal exam: Sensation intact and preserved anal wink. No external anal fissures, external hemorrhoids or skin tags. Normal sphincter tone. No palpable mass. No blood on the exam glove.  Anoscopy with small grade 1 hemorrhoids in all positions.  (Chaperone: Curlene Labrum, CMA).    ASSESSMENT AND PLAN;   1)  Constipation - Miralax 1 cap/day then titrate to effect - Increase daily water intake - If no improvement, can consider sitz marker study vs ARM vs colonoscopy  2) Rectal pain -Etiology not exactly clear.  Symptoms inconsistent with anal fissure, and no fissure noted on exam today.  Symptoms also not consistent with proctalgia fugax.  Not sure if this is really rectal itching that she is endorsing as pain. -Trial sitz bath -Trial course of RectiCare  3) Internal hemorrhoids -Small grade 1 internal hemorrhoids noted on anoscopy.  Mainly supportive care and treatment of underlying constipation as above -If pain type symptoms persist despite adequate treatment of constipation, could consider hemorrhoid band ligation  I spent 45 minutes of time, including in depth chart review, independent review of results as outlined above, communicating results with the patient directly, face-to-face time with the patient, coordinating care, ordering studies and medications as appropriate, and documentation.    Lavena Bullion, DO, FACG  12/19/2020, 10:30 AM   Mosie Lukes, MD

## 2020-12-19 NOTE — Telephone Encounter (Addendum)
Patient's son Alveta Heimlich calling catching an attitude because he said his mom was suppose to receive a prescription to the pharmacy and we didn't do our job right. I told him that in his note Dr Bryan Lemma mention over the counter medications and he cut me off saying that I needed to speak to Dr Bryan Lemma because its not right. Number to contact is [phone]  Did this patient need a prescription? If so let me know so I can call them back

## 2020-12-20 NOTE — Telephone Encounter (Signed)
Patient's son has been notified of this and he voiced understanding

## 2020-12-20 NOTE — Telephone Encounter (Signed)
MiraLAX and RectiCare are both over-the-counter.  We sometimes have coupons for the RectiCare to mitigate some of the cost.  I do not believe that we have any samples of this so.  But otherwise no, I did not prescribe any medications at the appt yesterday

## 2021-02-07 DIAGNOSIS — H401113 Primary open-angle glaucoma, right eye, severe stage: Secondary | ICD-10-CM | POA: Diagnosis not present

## 2021-02-07 DIAGNOSIS — H35351 Cystoid macular degeneration, right eye: Secondary | ICD-10-CM | POA: Diagnosis not present

## 2021-02-07 DIAGNOSIS — H18422 Band keratopathy, left eye: Secondary | ICD-10-CM | POA: Diagnosis not present

## 2021-02-25 ENCOUNTER — Encounter: Payer: Self-pay | Admitting: Family Medicine

## 2021-02-25 ENCOUNTER — Other Ambulatory Visit: Payer: Self-pay

## 2021-02-25 ENCOUNTER — Ambulatory Visit (INDEPENDENT_AMBULATORY_CARE_PROVIDER_SITE_OTHER): Payer: Medicare HMO | Admitting: Family Medicine

## 2021-02-25 VITALS — BP 112/64 | HR 69 | Temp 97.6°F | Resp 16 | Wt 126.8 lb

## 2021-02-25 DIAGNOSIS — R350 Frequency of micturition: Secondary | ICD-10-CM | POA: Diagnosis not present

## 2021-02-25 DIAGNOSIS — R351 Nocturia: Secondary | ICD-10-CM

## 2021-02-25 DIAGNOSIS — I10 Essential (primary) hypertension: Secondary | ICD-10-CM

## 2021-02-25 DIAGNOSIS — E785 Hyperlipidemia, unspecified: Secondary | ICD-10-CM | POA: Diagnosis not present

## 2021-02-25 DIAGNOSIS — R739 Hyperglycemia, unspecified: Secondary | ICD-10-CM | POA: Diagnosis not present

## 2021-02-25 LAB — LIPID PANEL
Cholesterol: 170 mg/dL (ref 0–200)
HDL: 58.1 mg/dL (ref >39.00)
LDL Cholesterol: 95 mg/dL (ref 0–99)
NonHDL: 111.55
Total CHOL/HDL Ratio: 3
Triglycerides: 83 mg/dL (ref 0.0–149.0)
VLDL: 16.6 mg/dL (ref 0.0–40.0)

## 2021-02-25 LAB — URINALYSIS, ROUTINE W REFLEX MICROSCOPIC
Bilirubin Urine: NEGATIVE
Hgb urine dipstick: NEGATIVE
Ketones, ur: NEGATIVE
Nitrite: POSITIVE — AB
Specific Gravity, Urine: 1.01 (ref 1.000–1.030)
Total Protein, Urine: NEGATIVE
Urine Glucose: NEGATIVE
Urobilinogen, UA: 0.2 (ref 0.0–1.0)
pH: 7 (ref 5.0–8.0)

## 2021-02-25 LAB — COMPREHENSIVE METABOLIC PANEL
ALT: 17 U/L (ref 0–35)
AST: 17 U/L (ref 0–37)
Albumin: 4 g/dL (ref 3.5–5.2)
Alkaline Phosphatase: 50 U/L (ref 39–117)
BUN: 25 mg/dL — ABNORMAL HIGH (ref 6–23)
CO2: 31 mEq/L (ref 19–32)
Calcium: 10.4 mg/dL (ref 8.4–10.5)
Chloride: 102 mEq/L (ref 96–112)
Creatinine, Ser: 0.96 mg/dL (ref 0.40–1.20)
GFR: 51.67 mL/min — ABNORMAL LOW (ref >60.00)
Glucose, Bld: 99 mg/dL (ref 70–99)
Potassium: 4.2 mEq/L (ref 3.5–5.1)
Sodium: 139 mEq/L (ref 135–145)
Total Bilirubin: 0.6 mg/dL (ref 0.2–1.2)
Total Protein: 6.4 g/dL (ref 6.0–8.3)

## 2021-02-25 LAB — CBC WITH DIFFERENTIAL/PLATELET
Basophils Absolute: 0 10*3/uL (ref 0.0–0.1)
Basophils Relative: 0.7 % (ref 0.0–3.0)
Eosinophils Absolute: 0.1 10*3/uL (ref 0.0–0.7)
Eosinophils Relative: 1.5 % (ref 0.0–5.0)
HCT: 36.7 % (ref 36.0–46.0)
Hemoglobin: 12.4 g/dL (ref 12.0–15.0)
Lymphocytes Relative: 31.2 % (ref 12.0–46.0)
Lymphs Abs: 2.2 10*3/uL (ref 0.7–4.0)
MCHC: 33.8 g/dL (ref 30.0–36.0)
MCV: 93.9 fl (ref 78.0–100.0)
Monocytes Absolute: 0.5 10*3/uL (ref 0.1–1.0)
Monocytes Relative: 7.1 % (ref 3.0–12.0)
Neutro Abs: 4.2 10*3/uL (ref 1.4–7.7)
Neutrophils Relative %: 59.5 % (ref 43.0–77.0)
Platelets: 236 10*3/uL (ref 150.0–400.0)
RBC: 3.91 Mil/uL (ref 3.87–5.11)
RDW: 13.2 % (ref 11.5–15.5)
WBC: 7.1 10*3/uL (ref 4.0–10.5)

## 2021-02-25 LAB — HEMOGLOBIN A1C: Hgb A1c MFr Bld: 6.5 % (ref 4.6–6.5)

## 2021-02-25 NOTE — Patient Instructions (Addendum)
https://www.diabeteseducator.org/docs/default-source/living-with-diabetes/conquering-the-grocery-store-v1.pdf?sfvrsn=4">  Carbohydrate Counting Adult Carbohydrate counting is a method of keeping track of how many carbohydrates you eat. Eating carbohydrates naturally increases the amount of sugar (glucose) in the blood. Counting how many carbohydrates you eat improves your blood glucose control, which helps you manage your diabetes. It is important to know how many carbohydrates you can safely have in each meal. This is different for every person. A dietitian can help you make a meal plan and calculate how many carbohydrates you should have at each meal and snack. What foods contain carbohydrates? Carbohydrates are found in the following foods:  Grains, such as breads and cereals.  Dried beans and soy products.  Starchy vegetables, such as potatoes, peas, and corn.  Fruit and fruit juices.  Milk and yogurt.  Sweets and snack foods, such as cake, cookies, candy, chips, and soft drinks.   How do I count carbohydrates in foods? There are two ways to count carbohydrates in food. You can read food labels or learn standard serving sizes of foods. You can use either of the methods or a combination of both. Using the Nutrition Facts label The Nutrition Facts list is included on the labels of almost all packaged foods and beverages in the [place] It includes:  The serving size.  Information about nutrients in each serving, including the grams (g) of carbohydrate per serving. To use the Nutrition Facts:  Decide how many servings you will have.  Multiply the number of servings by the number of carbohydrates per serving.  The resulting number is the total amount of carbohydrates that you will be having. Learning the standard serving sizes of foods When you eat carbohydrate foods that are not packaged or do not include Nutrition Facts on the label, you need to measure the servings in order to count  the amount of carbohydrates.  Measure the foods that you will eat with a food scale or measuring cup, if needed.  Decide how many standard-size servings you will eat.  Multiply the number of servings by 15. For foods that contain carbohydrates, one serving equals 15 g of carbohydrates. ? For example, if you eat 2 cups or 10 oz (300 g) of strawberries, you will have eaten 2 servings and 30 g of carbohydrates (2 servings x 15 g = 30 g).  For foods that have more than one food mixed, such as soups and casseroles, you must count the carbohydrates in each food that is included. The following list contains standard serving sizes of common carbohydrate-rich foods. Each of these servings has about 15 g of carbohydrates:  1 slice of bread.  1 six-inch (15 cm) tortilla.  ? cup or 2 oz (53 g) cooked rice or pasta.   cup or 3 oz (85 g) cooked or canned, drained and rinsed beans or lentils.   cup or 3 oz (85 g) starchy vegetable, such as peas, corn, or squash.   cup or 4 oz (120 g) hot cereal.   cup or 3 oz (85 g) boiled or mashed potatoes, or  or 3 oz (85 g) of a large baked potato.   cup or 4 fl oz (118 mL) fruit juice.  1 cup or 8 fl oz (237 mL) milk.  1 small or 4 oz (106 g) apple.   or 2 oz (63 g) of a medium banana.  1 cup or 5 oz (150 g) strawberries.  3 cups or 1 oz (24 g) popped popcorn. What is an example of carbohydrate counting? To  calculate the number of carbohydrates in this sample meal, follow the steps shown below. Sample meal  3 oz (85 g) chicken breast.  ? cup or 4 oz (106 g) brown rice.   cup or 3 oz (85 g) corn.  1 cup or 8 fl oz (237 mL) milk.  1 cup or 5 oz (150 g) strawberries with sugar-free whipped topping. Carbohydrate calculation 1. Identify the foods that contain carbohydrates: ? Rice. ? Corn. ? Milk. ? Strawberries. 2. Calculate how many servings you have of each food: ? 2 servings rice. ? 1 serving corn. ? 1 serving milk. ? 1  serving strawberries. 3. Multiply each number of servings by 15 g: ? 2 servings rice x 15 g = 30 g. ? 1 serving corn x 15 g = 15 g. ? 1 serving milk x 15 g = 15 g. ? 1 serving strawberries x 15 g = 15 g. 4. Add together all of the amounts to find the total grams of carbohydrates eaten: ? 30 g + 15 g + 15 g + 15 g = 75 g of carbohydrates total. What are tips for following this plan? Shopping  Develop a meal plan and then make a shopping list.  Buy fresh and frozen vegetables, fresh and frozen fruit, dairy, eggs, beans, lentils, and whole grains.  Look at food labels. Choose foods that have more fiber and less sugar.  Avoid processed foods and foods with added sugars. Meal planning  Aim to have the same amount of carbohydrates at each meal and for each snack time.  Plan to have regular, balanced meals and snacks. Where to find more information  American Diabetes Association: www.diabetes.org  Centers for Disease Control and Prevention: http://www.wolf.info/ Summary  Carbohydrate counting is a method of keeping track of how many carbohydrates you eat.  Eating carbohydrates naturally increases the amount of sugar (glucose) in the blood.  Counting how many carbohydrates you eat improves your blood glucose control, which helps you manage your diabetes.  A dietitian can help you make a meal plan and calculate how many carbohydrates you should have at each meal and snack. This information is not intended to replace advice given to you by your health care provider. Make sure you discuss any questions you have with your health care provider. Document Revised: 09/21/2019 Document Reviewed: 09/22/2019 Elsevier Patient Education  2021 [employer].

## 2021-02-25 NOTE — Assessment & Plan Note (Signed)
Encouraged heart healthy diet, increase exercise, avoid trans fats, consider a krill oil cap daily 

## 2021-02-25 NOTE — Assessment & Plan Note (Signed)
Well controlled, no changes to meds. Encouraged heart healthy diet such as the DASH diet and exercise as tolerated.  °

## 2021-02-25 NOTE — Assessment & Plan Note (Signed)
hgba1c acceptable, minimize simple carbs. Increase exercise as tolerated. Continue current meds 

## 2021-02-25 NOTE — Assessment & Plan Note (Signed)
She notes only going roughly twice a day but then at night gets up every couple of hours to urinate. She has had UTIs in the past so will check UA and culture today. She is offered an empiric trial of meds and/or a referral to urology for further evaluation and declines for now. She will let us know if she changes her mind

## 2021-02-25 NOTE — Progress Notes (Signed)
Patient ID: YATZIL CLIPPINGER, female    DOB: 19-Oct-1926  Age: 87 y.o. MRN: [id number]    Subjective:  Subjective  HPI Amber Morgan presents for office visit today for follow up on HTN and osteopenia. She reports that she has been experiencing difficulties urinating during the day, but states that it is easier to do at night. She denies any chest pain, SOB, fever, abdominal pain, cough, chills, sore throat, dysuria, urinary incontinence, back pain, HA, or N/VD.      Review of Systems  Constitutional: Negative for chills, fatigue and fever.  HENT: Negative for congestion, rhinorrhea, sinus pressure, sinus pain and sore throat.   Eyes: Negative for pain.  Respiratory: Negative for cough and shortness of breath.   Cardiovascular: Negative for chest pain, palpitations and leg swelling.  Gastrointestinal: Negative for abdominal pain, blood in stool, diarrhea, nausea and vomiting.  Genitourinary: Positive for difficulty urinating (during the day). Negative for decreased urine volume, dysuria, flank pain, frequency, vaginal bleeding and vaginal discharge.  Musculoskeletal: Negative for back pain.       (+) LE's swelling bilaterally  Neurological: Negative for headaches.    History Past Medical History:  Diagnosis Date  . Allergy   . Arthritis   . Blind right eye   . Cancer [facility])    breast cancer   . Colon polyps   . Diverticulitis   . Diverticulosis 01/04/2014  . Glaucoma   . Hernia, inguinal, right   . History of chicken pox    childhood  . History of hiatal hernia   . HTN (hypertension)   . Hyperglycemia 09/05/2013  . Lipoma of arm 12/09/2014   right  . Nocturia 02/08/2013  . Osteopenia 12/09/2014  . Personal history of colonic polyps 09/05/2013  . Pre-diabetes   . Preventative health care 08/14/2016  . Unspecified constipation 02/06/2013  . Urinary incontinence   . Viral infection characterized by skin and mucous membrane lesions 05/11/2013  . Wears glasses     She has a past  surgical history that includes Eye surgery; Bladder surgery (2003); Abdominal hysterectomy; Rectocele repair (Bilateral); Cataract extraction, bilateral; Appendectomy; Colonoscopy w/ biopsies and polypectomy; Breast lumpectomy with radioactive seed localization (Right, 04/12/2018); Inguinal hernia repair (Right, 04/12/2018); Breast lumpectomy (Right, 04/2018); and Breast biopsy (Right, 03/2018).   Her family history includes Alzheimer's disease in her sister; Arthritis in her son; Breast cancer in her paternal aunt and paternal aunt; Cancer in her sister; Cataracts in her son; Colon cancer in her mother and sister; Diabetes in her sister; Glaucoma in her sister, sister, sister, and son; Heart disease in her father and sister; Hyperlipidemia in her sister; Hypertension in her mother, sister, and son; Meniere's disease in her father.She reports that she has never smoked. She has never used smokeless tobacco. She reports that she does not drink alcohol and does not use drugs.  Current Outpatient Medications on File Prior to Visit  Medication Sig Dispense Refill  . aspirin 81 MG EC tablet Take 81 mg by mouth daily. Swallow whole.    . calcium-vitamin D (OSCAL WITH D) 500-200 MG-UNIT tablet Take 1 tablet by mouth.    . cholecalciferol (VITAMIN D) 1000 units tablet Take 1 tablet (1,000 Units total) by mouth daily.    . Difluprednate (DUREZOL OP) Place 1 drop into the right eye 2 (two) times daily.    . Fiber POWD Take 2 Scoops by mouth 3 (three) times daily. 2 teaspoon 3 times daily    . ketorolac (  ACULAR) 0.5 % ophthalmic solution Place 1 drop into the right eye 2 (two) times daily.    . Multiple Vitamins-Calcium (ONE-A-DAY WITHIN PO) Take 1 tablet by mouth daily.    Vladimir Faster Glycol-Propyl Glycol (SYSTANE OP) Apply 1 drop to eye 3 (three) times daily. Put in both eyes    . Probiotic Product (PROBIOTIC DAILY PO) Take 1 capsule by mouth daily.     . sodium chloride (MURO 128) 5 % ophthalmic ointment Place 1  application into the right eye at bedtime.    . sodium chloride (MURO 128) 5 % ophthalmic solution Place 1 drop into the right eye 2 (two) times daily. Use first then 10 minutes later uses Ketorolac     No current facility-administered medications on file prior to visit.     Objective:  Objective  Physical Exam Constitutional:      General: She is not in acute distress.    Appearance: Normal appearance. She is not ill-appearing or toxic-appearing.  HENT:     Head: Normocephalic and atraumatic.     Right Ear: Tympanic membrane, ear canal and external ear normal.     Left Ear: Tympanic membrane, ear canal and external ear normal.     Nose: No congestion or rhinorrhea.  Eyes:     Extraocular Movements: Extraocular movements intact.     Pupils: Pupils are equal, round, and reactive to light.  Cardiovascular:     Rate and Rhythm: Normal rate and regular rhythm.     Pulses: Normal pulses.     Heart sounds: Normal heart sounds. No murmur heard.   Pulmonary:     Effort: Pulmonary effort is normal. No respiratory distress.     Breath sounds: Normal breath sounds. No wheezing, rhonchi or rales.  Abdominal:     General: Bowel sounds are normal.     Palpations: Abdomen is soft. There is no mass.     Tenderness: There is no abdominal tenderness. There is no guarding.     Hernia: No hernia is present.  Musculoskeletal:        General: Normal range of motion.     Cervical back: Normal range of motion and neck supple.  Skin:    General: Skin is warm and dry.  Neurological:     Mental Status: She is alert and oriented to person, place, and time.  Psychiatric:        Behavior: Behavior normal.    BP 112/64   Pulse 69   Temp 97.6 F (36.4 C)   Resp 16   Wt 126 lb 12.8 oz (57.5 kg)   SpO2 96%   BMI 24.76 kg/m  Wt Readings from Last 3 Encounters:  02/25/21 126 lb 12.8 oz (57.5 kg)  12/19/20 126 lb 2 oz (57.2 kg)  11/28/20 125 lb 3.2 oz (56.8 kg)     Lab Results  Component  Value Date   WBC 6.8 11/28/2020   HGB 12.5 11/28/2020   HCT 37.4 11/28/2020   PLT 226.0 11/28/2020   GLUCOSE 101 (H) 11/28/2020   CHOL 169 11/28/2020   TRIG 90.0 11/28/2020   HDL 64.90 11/28/2020   LDLCALC 86 11/28/2020   ALT 15 11/28/2020   AST 16 11/28/2020   NA 139 11/28/2020   K 4.2 11/28/2020   CL 101 11/28/2020   CREATININE 1.00 11/28/2020   BUN 26 (H) 11/28/2020   CO2 31 11/28/2020   TSH 3.21 11/28/2020   HGBA1C 6.1 11/28/2020   MICROALBUR 0.8 08/06/2016  DG Bone Density  Result Date: 01/01/2021 EXAM: DUAL X-RAY ABSORPTIOMETRY (DXA) FOR BONE MINERAL DENSITY IMPRESSION: Ashaz Robling A Johan Antonacci Your patient Malee Grays completed a BMD test on 12/19/2020 using the [facility] (analysis version: 16.SP2) manufactured by [employer]. The following summarizes the results of our evaluation. [facility] PATIENT: Name: Bruna, Dills Patient ID: [id number] Birth Date: Mar 25, 1927 Height: 59.0 in. Gender: Female Measured: 12/19/2020 Weight: 126.1 lbs. Indications: Advanced Age, Caucasian, Estrogen Deficiency, Height Loss, Hysterectomy, Oophorectomy ( Bilateral), Post Menopausal, Pre-diabectic Fractures: Treatments: Multivitamin, Vitamin D ASSESSMENT: The BMD measured at Femur Neck Right is 0.756 g/cm2 with a T-score of -2.0. This patient is considered osteopenic according to [facility]) criteria. The scan quality is good. FRAX not done due to age. Site Region Measured Date Measured Age WHO YA BMD Classification T-score AP Spine L1-L4 14/19/[age] 91.4 Normal -0.6 1.108 g/cm2 AP Spine L1-L4 03/03/2018 88.6 Normal -0.5 1.122 g/cm2 DualFemur Neck Right 12/19/2020 91.4 Osteopenia -2.0 0.756 g/cm2 DualFemur Neck Right 03/03/2018 88.6 Osteopenia -1.8 0.794 g/cm2 DualFemur Total Mean 12/19/2020 91.4 Osteopenia -1.5 0.816 g/cm2 DualFemur Total Mean 03/03/2018 88.6 Osteopenia -1.4 0.827 g/cm2 Left Forearm Radius 33% 12/19/2020 91.4 Osteopenia -1.4 0.755 g/cm2 World Health Organization  [facility]) criteria for post-menopausal, Caucasian Women: Normal       T-score at or above -1 SD Osteopenia   T-score between -1 and -2.5 SD Osteoporosis T-score at or below -2.5 SD FRAX is not calculated due to the patient's age. RECOMMENDATION: 1. All patients should optimize calcium and vitamin D intake. 2. Consider FDA-approved medical therapies in postmenopausal women and men aged 16 years and older, based on the following: a. A hip or vertebral(clinical or morphometric) fracture. b. T-Score < -2.5 at the femoral neck or spine after appropriate evaluation to exclude secondary causes c. Low bone mass (T-score between -1.0 and -2.5 at the femoral neck or spine) and a 10 year probability of a hip fracture >3% or a 10 year probability of major osteoporosis-related fracture > 20% based on the [place]-adapted WHO algorithm d. Clinical judgement and/or patient preferences may indicate treatment for people with 10-year fracture probabilities above or below these levels FOLLOW-UP: Patients with diagnosis of osteoporosis or at high risk for fracture should have regular bone mineral density tests. For patients eligible for Medicare, routine testing is allowed once every 2 years. The testing frequency can be increased to one year for patients who have rapidly progressing disease, those who are receiving or discontinuing medical therapy to restore bone mass, or have additional risk factors. I have reviewed this report, and agree with the above findings. [facility] Electronically Signed   By: Nolon Nations M.D.   On: 01/01/2021 11:16     Assessment & Plan:  Plan    No orders of the defined types were placed in this encounter.   Problem List Items Addressed This Visit    HTN (hypertension)    Well controlled, no changes to meds. Encouraged heart healthy diet such as the DASH diet and exercise as tolerated.       Relevant Orders   CBC with Differential/Platelet   Hyperlipidemia    Encouraged heart healthy  diet, increase exercise, avoid trans fats, consider a krill oil cap daily      Relevant Orders   Lipid panel   Nocturia - Primary    She notes only going roughly twice a day but then at night gets up every couple of hours to urinate. She has had UTIs  in the past so will check UA and culture today. She is offered an empiric trial of meds and/or a referral to urology for further evaluation and declines for now. She will let us know if she changes her mind      Relevant Orders   Urine Culture   Urinalysis   Hyperglycemia    hgba1c acceptable, minimize simple carbs. Increase exercise as tolerated. Continue current meds      Relevant Orders   Hemoglobin A1c   Comprehensive metabolic panel   Urine Culture   Urinalysis   Urination frequency   Relevant Orders   Urine Culture   Urinalysis      Follow-up: Return in about 3 months (around 05/28/2021).   I,David Hanna,acting as a scribe for Penni Homans, MD.,have documented all relevant documentation on the behalf of Penni Homans, MD,as directed by  Penni Homans, MD while in the presence of Penni Homans, MD.  I, Mosie Lukes, MD personally performed the services described in this documentation. All medical record entries made by the scribe were at my direction and in my presence. I have reviewed the chart and agree that the record reflects my personal performance and is accurate and complete

## 2021-02-27 ENCOUNTER — Other Ambulatory Visit: Payer: Self-pay | Admitting: Family Medicine

## 2021-02-27 LAB — URINE CULTURE
MICRO NUMBER:: 11927984
SPECIMEN QUALITY:: ADEQUATE

## 2021-02-27 MED ORDER — SULFAMETHOXAZOLE-TRIMETHOPRIM 800-160 MG PO TABS: 1.0000 | ORAL_TABLET | Freq: Two times a day (BID) | ORAL | 0 refills | Status: DC

## 2021-02-28 ENCOUNTER — Other Ambulatory Visit: Payer: Self-pay

## 2021-02-28 MED ORDER — PROBIOTIC 250 MG PO CAPS: 250.0000 mg | ORAL_CAPSULE | Freq: Every day | ORAL | 2 refills | Status: DC

## 2021-03-18 DIAGNOSIS — H211X2 Other vascular disorders of iris and ciliary body, left eye: Secondary | ICD-10-CM | POA: Diagnosis not present

## 2021-03-18 DIAGNOSIS — H35371 Puckering of macula, right eye: Secondary | ICD-10-CM | POA: Diagnosis not present

## 2021-03-18 DIAGNOSIS — H35351 Cystoid macular degeneration, right eye: Secondary | ICD-10-CM | POA: Diagnosis not present

## 2021-03-18 DIAGNOSIS — H35361 Drusen (degenerative) of macula, right eye: Secondary | ICD-10-CM | POA: Diagnosis not present

## 2021-05-07 ENCOUNTER — Encounter: Payer: Self-pay | Admitting: Oncology

## 2021-05-07 ENCOUNTER — Inpatient Hospital Stay: Payer: Medicare HMO | Attending: Oncology | Admitting: Oncology

## 2021-05-07 ENCOUNTER — Inpatient Hospital Stay: Payer: Medicare HMO

## 2021-05-07 ENCOUNTER — Other Ambulatory Visit: Payer: Self-pay

## 2021-05-07 VITALS — BP 136/65 | HR 75 | Temp 97.5°F | Resp 18 | Ht 60.0 in | Wt 127.4 lb

## 2021-05-07 DIAGNOSIS — Z170 Estrogen receptor positive status [ER+]: Secondary | ICD-10-CM | POA: Diagnosis not present

## 2021-05-07 DIAGNOSIS — C50211 Malignant neoplasm of upper-inner quadrant of right female breast: Secondary | ICD-10-CM | POA: Diagnosis not present

## 2021-05-07 DIAGNOSIS — Z853 Personal history of malignant neoplasm of breast: Secondary | ICD-10-CM | POA: Diagnosis not present

## 2021-05-07 DIAGNOSIS — M8580 Other specified disorders of bone density and structure, unspecified site: Secondary | ICD-10-CM | POA: Insufficient documentation

## 2021-05-07 LAB — CBC WITH DIFFERENTIAL/PLATELET
Abs Immature Granulocytes: 0.01 10*3/uL (ref 0.00–0.07)
Basophils Absolute: 0 10*3/uL (ref 0.0–0.1)
Basophils Relative: 0 %
Eosinophils Absolute: 0.1 10*3/uL (ref 0.0–0.5)
Eosinophils Relative: 1 %
HCT: 34.9 % — ABNORMAL LOW (ref 36.0–46.0)
Hemoglobin: 12.1 g/dL (ref 12.0–15.0)
Immature Granulocytes: 0 %
Lymphocytes Relative: 30 %
Lymphs Abs: 2.5 10*3/uL (ref 0.7–4.0)
MCH: 32.2 pg (ref 26.0–34.0)
MCHC: 34.7 g/dL (ref 30.0–36.0)
MCV: 92.8 fL (ref 80.0–100.0)
Monocytes Absolute: 0.7 10*3/uL (ref 0.1–1.0)
Monocytes Relative: 8 %
Neutro Abs: 5.1 10*3/uL (ref 1.7–7.7)
Neutrophils Relative %: 61 %
Platelets: 211 10*3/uL (ref 150–400)
RBC: 3.76 MIL/uL — ABNORMAL LOW (ref 3.87–5.11)
RDW: 12.7 % (ref 11.5–15.5)
WBC: 8.4 10*3/uL (ref 4.0–10.5)
nRBC: 0 % (ref 0.0–0.2)

## 2021-05-07 NOTE — Progress Notes (Addendum)
[place]  [phone] [phone]     ID: Morgan Morgan DOB: [birth year]  MR#: [id number]  AYT#:[id number]  Patient Care Team: Morgan Lukes, MD as PCP - General (Family Medicine) Morgan Pain Mingo Morgan Morgan as Consulting Physician (Osteopathic Medicine) Amber Lipa, MD as Consulting Physician (Gastroenterology) Morgan Breed Denice Bors, MD as Consulting Physician (Cardiology) Amber Klein, MD as Consulting Physician (General Surgery) Morgan Morgan, Morgan Dad, MD as Consulting Physician (Oncology) Amber Gibson, MD as Attending Physician (Radiation Oncology) OTHER MD:  CHIEF COMPLAINT: Estrogen receptor positive lobular breast cancer  CURRENT TREATMENT: Observation   INTERVAL HISTORY: Morgan Morgan returns today for follow-up of her estrogen receptor positive lobular breast cancer. She continues under observation.  She is accompanied by her older son, Morgan Morgan  Her most recent bilateral diagnostic mammography with tomography at [facility] on 03/08/2020 showed: breast density category C; no evidence of malignancy in either breast.  She has not had mammography this year  Since her past moved to the [facility] last visit, she underwent bone density screening on 12/19/2020 showing a T-score of -2.0, which is considered osteopenic.   REVIEW OF SYSTEMS: Morgan Morgan has moved to [place].  She says it is pretty dull over there although sometimes they get games going if enough people want to participate.  She does quite a bit of walking and tells me she gets between 8 and 10,000 steps a day.  She has been minimally constipated but takes some fiber for that.  She has had no unusual headaches visual changes nausea vomiting cough phlegm production pleurisy shortness of breath or other worrisome symptoms.  A detailed review of systems today was stable.   COVID 19 VACCINATION STATUS: Moderna x4, most recently 02/2021   HISTORY OF CURRENT ILLNESS: From the original  intake note:  Morgan Morgan had routine screening mammography on 03/03/2018 showing a possible abnormality in the right breast. She underwent unilateral right diagnostic mammography with tomography and right breast ultrasonography at [facility] on 03/14/2018 showing: breast density category C. In the right breast, at the 1 o'clock upper inner quadrant there is a hypoechoic mass measuring 0.9 x 0.9 x 0.7 cm located 5 cm from the nipple. Ultrasonography revealed no evidence of lymphadenopathy in the right axilla.   Accordingly on 03/18/2018 she proceeded to biopsy of the right breast area in question. The pathology from this procedure showed ([id number]): Invasive lobular carcinoma grade II, spanning 0.6 cm. Prognostic indicators significant for: estrogen receptor, 100% positive and progesterone receptor, 100% positive, both with strong staining intensity. Proliferation marker Ki67 at 2%. HER2 not amplified with ratios HER2/CEP17 signals 1.62 and average HER2 copies per cell 3.80  The patient's subsequent history is as detailed below.   PAST MEDICAL HISTORY: Past Medical History:  Diagnosis Date   Allergy    Arthritis    Blind right eye    Cancer ([place])    breast cancer    Colon polyps    Diverticulitis    Diverticulosis 01/04/2014   Glaucoma    Hernia, inguinal, right    History of chicken pox    childhood   History of hiatal hernia    HTN (hypertension)    Hyperglycemia 09/05/2013   Lipoma of arm 12/09/2014   right   Nocturia 02/08/2013   Osteopenia 12/09/2014   Personal history of colonic polyps 09/05/2013   Pre-diabetes    Preventative health care 08/14/2016   Unspecified constipation 02/06/2013   Urinary incontinence  Viral infection characterized by skin and mucous membrane lesions 05/11/2013   Wears glasses     PAST SURGICAL HISTORY: Past Surgical History:  Procedure Laterality Date   ABDOMINAL HYSTERECTOMY     APPENDECTOMY     BLADDER SURGERY  2003   bladder tact    BREAST BIOPSY Right 04-10-2018   BREAST LUMPECTOMY Right 04/2018   BREAST LUMPECTOMY WITH RADIOACTIVE SEED LOCALIZATION Right 04/12/2018   Procedure: BREAST LUMPECTOMY WITH RADIOACTIVE SEED LOCALIZATION;  Surgeon: Amber Klein, MD;  Location: [place];  Service: General;  Laterality: Right;   CATARACT EXTRACTION, BILATERAL     COLONOSCOPY W/ BIOPSIES AND POLYPECTOMY     EYE SURGERY     multiple bilateral   INGUINAL HERNIA REPAIR Right 04/12/2018   Procedure: OPEN RIGHT INGUINAL HERNIA REPAIR WITH MESH;  Surgeon: Amber Klein, MD;  Location: [place];  Service: General;  Laterality: Right;   RECTOCELE REPAIR Bilateral   Appendectomy and planned Cholescystectomy   FAMILY HISTORY Family History  Problem Relation Age of Onset   Colon cancer Mother    Hypertension Mother    Heart disease Father    Meniere's disease Father    Heart disease Sister    Arthritis Son        back   Glaucoma Son    Hypertension Son    Cataracts Son    Colon cancer Sister    Alzheimer's disease Sister    Glaucoma Sister    Glaucoma Sister    Hyperlipidemia Sister    Hypertension Sister    Cancer Sister        thyroid   Diabetes Sister    Glaucoma Sister    Breast cancer Paternal Aunt    Breast cancer Paternal 85   The patient's father died at age 16 due to MI. The patient's mother died at 50. The patient's mother had colon cancer diagnosed at age 63. There was a paternal aunt with breast cancer diagnosed at an old age. The patient's sister was diagnosed with colon cancer at age 83. She denies a history of ovarian cancer in the family.    GYNECOLOGIC HISTORY:  No LMP recorded. Patient has had a hysterectomy. Menarche: 87 years old Age at first live birth: 87 years old She is GXP4. She is status post total hysterectomy with bilateral salpingo-oophorectomy in 1978. The patient briefly took HRT, and she didn't have intense hot flashes. She never used contraception.    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 Her husband died in 10-Apr-2018 due to lung issues.  She is currently living at [facility] living.  The patient's son, Morgan Morgan is retired from Research officer, trade union and [place]. The patient's son Morgan Morgan retired after 30 years in the [employer]. The patient's son, Morgan Morgan is a Designer, industrial/product in [place]. The patient's son Morgan Morgan, is an Scientist, research (physical sciences) in [place]. The patient has 8 grandchildren and 8 great- grandchildren. The patient attends [employer].   ADVANCED DIRECTIVES: The patient's son- Morgan Morgan [phone] is her healthcare power of attorney   HEALTH MAINTENANCE: Social History   Tobacco Use   Smoking status: Never   Smokeless tobacco: Never  Vaping Use   Vaping Use: Never used  Substance Use Topics   Alcohol use: No   Drug use: No      Allergies  Allergen Reactions   Amlodipine Other (See Comments)    Mental status chnages ie confusion   Doxycycline Rash    Current Outpatient Medications  Medication Sig Dispense Refill   aspirin 81 MG EC tablet Take 81 mg by mouth daily. Swallow whole.     calcium-vitamin D (OSCAL WITH D) 500-200 MG-UNIT tablet Take 1 tablet by mouth.     cholecalciferol (VITAMIN D) 1000 units tablet Take 1 tablet (1,000 Units total) by mouth daily.     Difluprednate (DUREZOL OP) Place 1 drop into the right eye 2 (two) times daily.     Fiber POWD Take 2 Scoops by mouth 3 (three) times daily. 2 teaspoon 3 times daily     ketorolac (ACULAR) 0.5 % ophthalmic solution Place 1 drop into the right eye 2 (two) times daily.     Multiple Vitamins-Calcium (ONE-A-DAY WITHIN PO) Take 1 tablet by mouth daily.     Polyethyl Glycol-Propyl Glycol (SYSTANE OP) Apply 1 drop to eye 3 (three) times daily. Put in both eyes     Probiotic Product (PROBIOTIC DAILY PO) Take 1 capsule by mouth daily.      Saccharomyces boulardii (PROBIOTIC) 250 MG CAPS Take 250 mg by mouth daily. 90 capsule 2   sodium chloride (MURO 128) 5 % ophthalmic ointment Place 1  application into the right eye at bedtime.     sodium chloride (MURO 128) 5 % ophthalmic solution Place 1 drop into the right eye 2 (two) times daily. Use first then 10 minutes later uses Ketorolac     sulfamethoxazole-trimethoprim (BACTRIM DS) 800-160 MG tablet Take 1 tablet by mouth 2 (two) times daily. 6 tablet 0   No current facility-administered medications for this visit.    OBJECTIVE:  white woman who appears stated age.  Vitals:   05/07/21 1458  BP: 136/65  Pulse: 75  Resp: 18  Temp: (!) 97.5 F (36.4 C)  SpO2: 99%      Body mass index is 24.88 kg/m.   Wt Readings from Last 3 Encounters:  05/07/21 127 lb 6.4 oz (57.8 kg)  02/25/21 126 lb 12.8 oz (57.5 kg)  12/19/20 126 lb 2 oz (57.2 kg)      ECOG FS:1 - Symptomatic but completely ambulatory  Sclerae unicteric, EOMs intact, mild left ptosis Wearing a mask No cervical or supraclavicular adenopathy Lungs no rales or rhonchi Heart regular rate and rhythm, no murmur appreciated Abd soft, nontender, positive bowel sounds, no masses palpated MSK no focal spinal tenderness, no upper extremity lymphedema Neuro: nonfocal, appropriate affect Breasts: The right breast is status post lumpectomy.  Aside from mild postoperative changes there are no findings of concern and no evidence of local recurrence.  The left breast and both axillae are benign   LAB RESULTS:  CMP     Component Value Date/Time   NA 139 02/25/2021 1153   K 4.2 02/25/2021 1153   CL 102 02/25/2021 1153   CO2 31 02/25/2021 1153   GLUCOSE 99 02/25/2021 1153   BUN 25 (H) 02/25/2021 1153   CREATININE 0.96 02/25/2021 1153   CREATININE 0.98 (H) 05/27/2020 1136   CALCIUM 10.4 02/25/2021 1153   PROT 6.4 02/25/2021 1153   ALBUMIN 4.0 02/25/2021 1153   Morgan 17 02/25/2021 1153   Morgan 20 03/30/2018 1214   ALT 17 02/25/2021 1153   ALT 21 03/30/2018 1214   ALKPHOS 50 02/25/2021 1153   BILITOT 0.6 02/25/2021 1153   BILITOT 0.3 03/30/2018 1214   GFRNONAA 50 (L)  05/06/2020 1422   GFRNONAA 51 (L) 03/30/2018 1214   GFRAA 57 (L) 05/06/2020 1422   GFRAA 59 (L) 03/30/2018 1214  No results found for: TOTALPROTELP, ALBUMINELP, A1GS, A2GS, BETS, BETA2SER, GAMS, MSPIKE, SPEI  No results found for: KPAFRELGTCHN, LAMBDASER, [facility]  Lab Results  Component Value Date   WBC 8.4 05/07/2021   NEUTROABS 5.1 05/07/2021   HGB 12.1 05/07/2021   HCT 34.9 (L) 05/07/2021   MCV 92.8 05/07/2021   PLT 211 05/07/2021   No results found for: LABCA2  No components found for: PIRJJO841  No results for input(s): INR in the last 168 hours.  No results found for: LABCA2  No results found for: YSA630  No results found for: ZSW109  No results found for: NAT557  No results found for: CA2729  No components found for: HGQUANT  No results found for: CEA1 / No results found for: CEA1   No results found for: AFPTUMOR  No results found for: CHROMOGRNA  No results found for: HGBA, HGBA2QUANT, HGBFQUANT, HGBSQUAN (Hemoglobinopathy evaluation)   No results found for: LDH  No results found for: IRON, TIBC, IRONPCTSAT (Iron and TIBC)  No results found for: FERRITIN  Urinalysis    Component Value Date/Time   COLORURINE YELLOW 02/25/2021 1153   APPEARANCEUR Sl Cloudy (A) 02/25/2021 1153   LABSPEC 1.010 02/25/2021 1153   PHURINE 7.0 02/25/2021 1153   GLUCOSEU NEGATIVE 02/25/2021 1153   HGBUR NEGATIVE 02/25/2021 1153   BILIRUBINUR NEGATIVE 02/25/2021 1153   BILIRUBINUR Neg 11/28/2020 1127   KETONESUR NEGATIVE 02/25/2021 1153   PROTEINUR Negative 11/28/2020 1127   PROTEINUR NEG 02/06/2013 1053   UROBILINOGEN 0.2 02/25/2021 1153   NITRITE POSITIVE (A) 02/25/2021 1153   LEUKOCYTESUR SMALL (A) 02/25/2021 1153    STUDIES: No results found.    ELIGIBLE FOR AVAILABLE RESEARCH PROTOCOL: no  ASSESSMENT: 87 y.o. [place], [place] woman status post right breast upper inner quadrant biopsy 03/18/2018 for a clinical T1b No, stage IA base of lobular  carcinoma, grade 2, estrogen and progesterone receptor positive, HER-2 not amplified, with an MIB-1 of 2%.  (1) status post right lumpectomy with no sentinel lymph node sampling 04/12/2018 for a pT1c cN0, stage IA invasive lobular carcinoma, with negative margins  (2) no adjuvant radiation indicated  (3) anastrozole prescribed 05/20/2018 but never started by the patient  (a) bone density 03/03/2018 finds a T score of -1.8 (osteopenia).   PLAN: Jesyca is now 3 years out from definitive surgery for her breast cancer with no evidence of disease recurrence.  This is favorable.  We normally follow patients for 5 years chiefly because we normally treat them with antiestrogens for 5 years.  Since we are only observing in Makensie's case it would be possible for her breast cancer follow-up to be taken over by her primary care physician Dr. Maryellen Pile and that is what the patient would prefer.  Verawill need in terms of breast cancer follow-up is yearly screening mammography which she usually obtains at the [facility] in August (just placed the order for this year) and a yearly physician breast exam.  I will be glad to see Honore again at any point in the future if and when the need arises but as of now are making no further routine appointments for her here.  Total encounter time 25 minutes.*   Tyke Outman, Morgan Dad, MD  05/07/21 4:00 PM Medical Oncology and Hematology [facility] [place], [place] [address] Tel. [phone]    Fax. [phone]    I, Wilburn Mylar, am acting as scribe for Dr. Virgie Morgan. Leiam Hopwood.  Lindie Spruce MD, have reviewed  the above documentation for accuracy and completeness, and I agree with the above.   *Total Encounter Time as defined by the Centers for Medicare and Medicaid Services includes, in addition to the face-to-face time of a patient visit (documented in the note above) non-face-to-face time: obtaining and reviewing outside  history, ordering and reviewing medications, tests or procedures, care coordination (communications with other health care professionals or caregivers) and documentation in the medical record.

## 2021-06-17 ENCOUNTER — Ambulatory Visit (INDEPENDENT_AMBULATORY_CARE_PROVIDER_SITE_OTHER): Payer: Medicare HMO | Admitting: Family Medicine

## 2021-06-17 ENCOUNTER — Other Ambulatory Visit: Payer: Self-pay

## 2021-06-17 ENCOUNTER — Encounter: Payer: Self-pay | Admitting: Family Medicine

## 2021-06-17 ENCOUNTER — Telehealth: Payer: Self-pay | Admitting: Family Medicine

## 2021-06-17 DIAGNOSIS — I10 Essential (primary) hypertension: Secondary | ICD-10-CM | POA: Diagnosis not present

## 2021-06-17 DIAGNOSIS — R739 Hyperglycemia, unspecified: Secondary | ICD-10-CM

## 2021-06-17 DIAGNOSIS — Z23 Encounter for immunization: Secondary | ICD-10-CM | POA: Diagnosis not present

## 2021-06-17 DIAGNOSIS — R000 Tachycardia, unspecified: Secondary | ICD-10-CM

## 2021-06-17 DIAGNOSIS — E785 Hyperlipidemia, unspecified: Secondary | ICD-10-CM

## 2021-06-17 DIAGNOSIS — R001 Bradycardia, unspecified: Secondary | ICD-10-CM

## 2021-06-17 DIAGNOSIS — R351 Nocturia: Secondary | ICD-10-CM

## 2021-06-17 LAB — CBC
HCT: 38.9 % (ref 36.0–46.0)
Hemoglobin: 12.9 g/dL (ref 12.0–15.0)
MCHC: 33.2 g/dL (ref 30.0–36.0)
MCV: 94.9 fl (ref 78.0–100.0)
Platelets: 226 10*3/uL (ref 150.0–400.0)
RBC: 4.1 Mil/uL (ref 3.87–5.11)
RDW: 12.9 % (ref 11.5–15.5)
WBC: 6.6 10*3/uL (ref 4.0–10.5)

## 2021-06-17 LAB — COMPREHENSIVE METABOLIC PANEL
ALT: 17 U/L (ref 0–35)
AST: 16 U/L (ref 0–37)
Albumin: 4 g/dL (ref 3.5–5.2)
Alkaline Phosphatase: 43 U/L (ref 39–117)
BUN: 29 mg/dL — ABNORMAL HIGH (ref 6–23)
CO2: 30 mEq/L (ref 19–32)
Calcium: 10.4 mg/dL (ref 8.4–10.5)
Chloride: 103 mEq/L (ref 96–112)
Creatinine, Ser: 1 mg/dL (ref 0.40–1.20)
GFR: 49.09 mL/min — ABNORMAL LOW (ref >60.00)
Glucose, Bld: 95 mg/dL (ref 70–99)
Potassium: 4.7 mEq/L (ref 3.5–5.1)
Sodium: 139 mEq/L (ref 135–145)
Total Bilirubin: 0.5 mg/dL (ref 0.2–1.2)
Total Protein: 6.5 g/dL (ref 6.0–8.3)

## 2021-06-17 LAB — LIPID PANEL
Cholesterol: 157 mg/dL (ref 0–200)
HDL: 56.3 mg/dL (ref >39.00)
LDL Cholesterol: 81 mg/dL (ref 0–99)
NonHDL: 100.7
Total CHOL/HDL Ratio: 3
Triglycerides: 100 mg/dL (ref 0.0–149.0)
VLDL: 20 mg/dL (ref 0.0–40.0)

## 2021-06-17 LAB — TSH: TSH: 3 u[IU]/mL (ref 0.35–5.50)

## 2021-06-17 LAB — HEMOGLOBIN A1C: Hgb A1c MFr Bld: 6.2 % (ref 4.6–6.5)

## 2021-06-17 NOTE — Assessment & Plan Note (Signed)
Monitor Vitamin D and calcium presently taking an MVI daily. Citracal 2 daily and Vitamin D 1000 IU caps 2 daily, check cmp and Vitamin D today and adjust treatment as warranted

## 2021-06-17 NOTE — Patient Instructions (Addendum)
Oat Milk  Bone density shows osteopenia, which is thinner than normal but not as bad as osteoporosis. Recommend calcium intake of 1200 to 1500 mg daily, divided into roughly 3 doses. Best source is the diet and a single dairy serving is about 500 mg, a supplement of calcium citrate once or twice daily to balance diet is fine if not getting enough in diet. Also need Vitamin D 2000 IU caps, 1 cap daily if not already taking vitamin D. Also recommend weight baring exercise on hips and upper body to keep bones strong    Paxlovid is the new COVID medication we can give you if you get COVID so make sure you test if you have symptoms because we have to treat by day 5 of symptoms for it to be effective. If you are positive let us know so we can treat. If a home test is negative and your symptoms are persistent get a PCR test. Can check testing locations at [email or web address] If you are positive we will make an appointment with [place] and we will send in Paxlovid if you would like it. Check with your pharmacy before we meet to confirm they have it in stock, if they do not then we can get the prescription at the [facility]

## 2021-06-17 NOTE — Assessment & Plan Note (Signed)
RRR today 

## 2021-06-17 NOTE — Telephone Encounter (Signed)
Patient is calling in regards of a letter from Dr. Jana Hakim. While she was in for her appointment 09/13, she forgot to mention about the letter to Dr. Charlett Blake. She was made aware that she has a mammogram order in, and could call to schedule her scan at her convenience. She would like someone to call her back to let her know that Dr. Charlett Blake is aware of the letter. Please advice.

## 2021-06-17 NOTE — Assessment & Plan Note (Signed)
Up 3-4 x a night. Check UA and culture. consider referral for evaluation only if worsens or if patient requests

## 2021-06-17 NOTE — Assessment & Plan Note (Signed)
hgba1c acceptable, minimize simple carbs. Increase exercise as tolerated.  

## 2021-06-17 NOTE — Progress Notes (Signed)
Subjective:   By signing my name below, I, Burnett Corrente, attest that this documentation has been prepared under the direction and in the presence of Penni Homans 06/17/2022     Patient ID: Amber Morgan, female    DOB: [birth year], 87 y.o.   MRN: [id number]  Chief Complaint  Patient presents with   3 months f/u    Vitamin D     HPI Patient is in today for office visit and other chronic medical concerns. She is accompanied by her son. She is experiencing nocturia. She average 3-4 times urinating at night. She includes she does not urinate during the day. She denied burning urination. She eats yogurt and drinks soy silk milk. 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 Agreed to do flu vaccination at this time. She also consider getting the new covid booster vaccination.   Patient denies any blood in stool, chest pains, palpitations, shortness of breath, congestion, fever, of GI c/o  Past Medical History:  Diagnosis Date   Allergy    Arthritis    Blind right eye    Cancer ([place])    breast cancer    Colon polyps    Diverticulitis    Diverticulosis 01/04/2014   Glaucoma    Hernia, inguinal, right    History of chicken pox    childhood   History of hiatal hernia    HTN (hypertension)    Hyperglycemia 09/05/2013   Lipoma of arm 12/09/2014   right   Nocturia 02/08/2013   Osteopenia 12/09/2014   Personal history of colonic polyps 09/05/2013   Pre-diabetes    Preventative health care 08/14/2016   Unspecified constipation 02/06/2013   Urinary incontinence    Viral infection characterized by skin and mucous membrane lesions 05/11/2013   Wears glasses     Past Surgical History:  Procedure Laterality Date   ABDOMINAL HYSTERECTOMY     APPENDECTOMY     BLADDER SURGERY  2003   bladder tact   BREAST BIOPSY Right 03/2018   BREAST LUMPECTOMY Right 04/2018   BREAST LUMPECTOMY WITH RADIOACTIVE SEED LOCALIZATION Right 04/12/2018    Procedure: BREAST LUMPECTOMY WITH RADIOACTIVE SEED LOCALIZATION;  Surgeon: [name], MD;  Location: [facility];  Service: General;  Laterality: Right;   CATARACT EXTRACTION, BILATERAL     COLONOSCOPY W/ BIOPSIES AND POLYPECTOMY     EYE SURGERY     multiple bilateral   INGUINAL HERNIA REPAIR Right 04/12/2018   Procedure: OPEN RIGHT INGUINAL HERNIA REPAIR WITH MESH;  Surgeon: [name], MD;  Location: [facility];  Service: General;  Laterality: Right;   RECTOCELE REPAIR Bilateral     Family History  Problem Relation Age of Onset   Colon cancer Mother    Hypertension Mother    Heart disease Father    Meniere's disease Father    Heart disease Sister    Arthritis Son        back   Glaucoma Son    Hypertension Son    Cataracts Son    Colon cancer Sister    Alzheimer's disease Sister    Glaucoma Sister    Glaucoma Sister    Hyperlipidemia Sister    Hypertension Sister    Cancer Sister        thyroid   Diabetes Sister    Glaucoma Sister    Breast cancer Paternal Aunt    Breast cancer Paternal Aunt     Social History  Socioeconomic History   Marital status: Married    Spouse name: Not on file   Number of children: 4   Years of education: Not on file   Highest education level: Not on file  Occupational History   Not on file  Tobacco Use   Smoking status: Never   Smokeless tobacco: Never  Vaping Use   Vaping Use: Never used  Substance and Sexual Activity   Alcohol use: No   Drug use: No   Sexual activity: Not Currently  Other Topics Concern   Not on file  Social History Narrative   Not on file   Social Determinants of Health   Financial Resource Strain: Not on file  Food Insecurity: Not on file  Transportation Needs: Not on file  Physical Activity: Not on file  Stress: Not on file  Social Connections: Not on file  Intimate Partner Violence: Not on file    Outpatient Medications Prior to Visit  Medication Sig Dispense Refill   aspirin 81 MG EC tablet Take  81 mg by mouth daily. Swallow whole.     calcium-vitamin D (OSCAL WITH D) 500-200 MG-UNIT tablet Take 1 tablet by mouth 2 (two) times daily.     cholecalciferol (VITAMIN D) 1000 units tablet Take 1 tablet (1,000 Units total) by mouth daily.     Difluprednate (DUREZOL OP) Place 1 drop into the right eye once.     Fiber POWD Take 2 Scoops by mouth 3 (three) times daily. 2 teaspoon 3 times daily     ketorolac (ACULAR) 0.5 % ophthalmic solution Place 1 drop into the right eye daily.     Multiple Vitamins-Calcium (ONE-A-DAY WITHIN PO) Take 1 tablet by mouth daily.     Polyethyl Glycol-Propyl Glycol (SYSTANE OP) Apply 1 drop to eye 3 (three) times daily. Put in both eyes     Probiotic Product (PROBIOTIC DAILY PO) Take 1 capsule by mouth daily.      Saccharomyces boulardii (PROBIOTIC) 250 MG CAPS Take 250 mg by mouth daily. 90 capsule 2   sodium chloride (MURO 128) 5 % ophthalmic ointment Place 1 application into the right eye at bedtime.     sodium chloride (MURO 128) 5 % ophthalmic solution Place 1 drop into the right eye 2 (two) times daily. Use first then 10 minutes later uses Ketorolac     sulfamethoxazole-trimethoprim (BACTRIM DS) 800-160 MG tablet Take 1 tablet by mouth 2 (two) times daily. 6 tablet 0   No facility-administered medications prior to visit.    Allergies  Allergen Reactions   Amlodipine Other (See Comments)    Mental status chnages ie confusion   Doxycycline Rash    Review of Systems  Constitutional:  Negative for chills, fever and malaise/fatigue.  HENT:  Negative for congestion, sinus pain and sore throat.   Eyes:  Negative for pain.  Respiratory:  Negative for cough, shortness of breath and wheezing.   Cardiovascular:  Negative for chest pain, palpitations and leg swelling.  Gastrointestinal:  Negative for abdominal pain, nausea and vomiting.  Genitourinary:  Negative for frequency (nocturia) and urgency.  Musculoskeletal:  Negative for falls, joint pain and myalgias.   Skin:  Negative for itching and rash.  Neurological:  Negative for dizziness, weakness and headaches.  Psychiatric/Behavioral:  Negative for depression and memory loss. The patient does not have insomnia.       Objective:    Physical Exam Constitutional:      General: She is not in acute distress.  Appearance: Normal appearance. She is not ill-appearing.  HENT:     Head: Normocephalic.     Right Ear: Tympanic membrane and external ear normal.     Left Ear: Tympanic membrane and external ear normal.     Nose: Nose normal.  Eyes:     Extraocular Movements: Extraocular movements intact.     Pupils: Pupils are equal, round, and reactive to light.  Cardiovascular:     Rate and Rhythm: Normal rate and regular rhythm.     Pulses: Normal pulses.     Heart sounds: Normal heart sounds.  Pulmonary:     Effort: Pulmonary effort is normal.     Breath sounds: Normal breath sounds.  Musculoskeletal:        General: No swelling. Normal range of motion.     Cervical back: Normal range of motion and neck supple.  Skin:    General: Skin is warm and dry.  Neurological:     Mental Status: She is alert and oriented to person, place, and time.  Psychiatric:        Mood and Affect: Mood normal.        Behavior: Behavior normal.    BP 122/68   Pulse 73   Temp 97.7 F (36.5 C)   Resp 16   Wt 124 lb 12.8 oz (56.6 kg)   SpO2 93%   BMI 24.37 kg/m  Wt Readings from Last 3 Encounters:  06/17/21 124 lb 12.8 oz (56.6 kg)  05/07/21 127 lb 6.4 oz (57.8 kg)  02/25/21 126 lb 12.8 oz (57.5 kg)    Diabetic Foot Exam - Simple   No data filed    Lab Results  Component Value Date   WBC 8.4 05/07/2021   HGB 12.1 05/07/2021   HCT 34.9 (L) 05/07/2021   PLT 211 05/07/2021   GLUCOSE 99 02/25/2021   CHOL 170 02/25/2021   TRIG 83.0 02/25/2021   HDL 58.10 02/25/2021   LDLCALC 95 02/25/2021   ALT 17 02/25/2021   Morgan 17 02/25/2021   NA 139 02/25/2021   K 4.2 02/25/2021   CL 102 02/25/2021    CREATININE 0.96 02/25/2021   BUN 25 (H) 02/25/2021   CO2 31 02/25/2021   TSH 3.21 11/28/2020   HGBA1C 6.5 02/25/2021   MICROALBUR 0.8 08/06/2016    Lab Results  Component Value Date   TSH 3.21 11/28/2020   Lab Results  Component Value Date   WBC 8.4 05/07/2021   HGB 12.1 05/07/2021   HCT 34.9 (L) 05/07/2021   MCV 92.8 05/07/2021   PLT 211 05/07/2021   Lab Results  Component Value Date   NA 139 02/25/2021   K 4.2 02/25/2021   CO2 31 02/25/2021   GLUCOSE 99 02/25/2021   BUN 25 (H) 02/25/2021   CREATININE 0.96 02/25/2021   BILITOT 0.6 02/25/2021   ALKPHOS 50 02/25/2021   Morgan 17 02/25/2021   ALT 17 02/25/2021   PROT 6.4 02/25/2021   ALBUMIN 4.0 02/25/2021   CALCIUM 10.4 02/25/2021   ANIONGAP 6 05/06/2020   GFR 51.67 (L) 02/25/2021   Lab Results  Component Value Date   CHOL 170 02/25/2021   Lab Results  Component Value Date   HDL 58.10 02/25/2021   Lab Results  Component Value Date   LDLCALC 95 02/25/2021   Lab Results  Component Value Date   TRIG 83.0 02/25/2021   Lab Results  Component Value Date   CHOLHDL 3 02/25/2021   Lab Results  Component  Value Date   HGBA1C 6.5 02/25/2021       Assessment & Plan:   Problem List Items Addressed This Visit     HTN (hypertension)    Well controlled, no changes to meds. Encouraged heart healthy diet such as the DASH diet and exercise as tolerated.       Relevant Orders   CBC   Comprehensive metabolic panel   TSH   Hyperlipidemia    Encourage heart healthy diet such as MIND or DASH diet, increase exercise, avoid trans fats, simple carbohydrates and processed foods, consider a krill or fish or flaxseed oil cap daily.       Relevant Orders   Lipid panel   Nocturia    Up 3-4 x a night. Check UA and culture. consider referral for evaluation only if worsens or if patient requests      Hyperglycemia    hgba1c acceptable, minimize simple carbs. Increase exercise as tolerated.       Relevant Orders    Hemoglobin A1c   Bradycardia    RRR today      Tachycardia    RRR today      Relevant Orders   TSH   Hypercalcemia - Primary    Monitor Vitamin D and calcium presently taking an MVI daily. Citracal 2 daily and Vitamin D 1000 IU caps 2 daily, check cmp and Vitamin D today and adjust treatment as warranted      Relevant Orders   Vitamin D 1,25 dihydroxy   Other Visit Diagnoses     Need for influenza vaccination       Relevant Orders   Flu Vaccine QUAD High Dose(Fluad) (Completed)      F/U in 3 months  No orders of the defined types were placed in this encounter.   I, Penni Homans, MD, personally preformed the services described in this documentation.  All medical record entries made by the scribe were at my direction and in my presence.  I have reviewed the chart and discharge instructions (if applicable) and agree that the record reflects my personal performance and is accurate and complete. Penni Homans 06/17/2021   I,Jada Bradford,acting as a scribe for Penni Homans, MD.,have documented all relevant documentation on the behalf of Penni Homans, MD,as directed by  Penni Homans, MD while in the presence of Penni Homans, MD.  I, Mosie Lukes, MD personally performed the services described in this documentation. All medical record entries made by the scribe were at my direction and in my presence. I have reviewed the chart and agree that the record reflects my personal performance and is accurate and complete       Penni Homans, MD

## 2021-06-17 NOTE — Assessment & Plan Note (Signed)
Well controlled, no changes to meds. Encouraged heart healthy diet such as the DASH diet and exercise as tolerated.  °

## 2021-06-17 NOTE — Assessment & Plan Note (Signed)
Encourage heart healthy diet such as MIND or DASH diet, increase exercise, avoid trans fats, simple carbohydrates and processed foods, consider a krill or fish or flaxseed oil cap daily.  °

## 2021-06-18 NOTE — Telephone Encounter (Signed)
We aware of mammogram getting scheduled

## 2021-06-20 LAB — VITAMIN D 1,25 DIHYDROXY
Vitamin D 1, 25 (OH)2 Total: 32 pg/mL (ref 18–72)
Vitamin D2 1, 25 (OH)2: <8 pg/mL
Vitamin D3 1, 25 (OH)2: 32 pg/mL

## 2021-06-24 DIAGNOSIS — H211X2 Other vascular disorders of iris and ciliary body, left eye: Secondary | ICD-10-CM | POA: Diagnosis not present

## 2021-06-24 DIAGNOSIS — H35371 Puckering of macula, right eye: Secondary | ICD-10-CM | POA: Diagnosis not present

## 2021-06-24 DIAGNOSIS — H35361 Drusen (degenerative) of macula, right eye: Secondary | ICD-10-CM | POA: Diagnosis not present

## 2021-06-24 DIAGNOSIS — H35351 Cystoid macular degeneration, right eye: Secondary | ICD-10-CM | POA: Diagnosis not present

## 2021-06-25 ENCOUNTER — Telehealth: Payer: Self-pay

## 2021-06-25 NOTE — Telephone Encounter (Signed)
Pt called in wanted to know what her last A1C was. I told her 6.2

## 2021-07-02 ENCOUNTER — Ambulatory Visit
Admission: RE | Admit: 2021-07-02 | Discharge: 2021-07-02 | Disposition: A | Discharge status: Home / Self Care | Payer: Medicare HMO | Source: Ambulatory Visit | Attending: Oncology | Admitting: Oncology

## 2021-07-02 ENCOUNTER — Other Ambulatory Visit: Payer: Self-pay

## 2021-07-02 DIAGNOSIS — Z170 Estrogen receptor positive status [ER+]: Secondary | ICD-10-CM

## 2021-07-02 DIAGNOSIS — R922 Inconclusive mammogram: Secondary | ICD-10-CM | POA: Diagnosis not present

## 2021-08-15 DIAGNOSIS — H401133 Primary open-angle glaucoma, bilateral, severe stage: Secondary | ICD-10-CM | POA: Diagnosis not present

## 2021-08-15 DIAGNOSIS — H35351 Cystoid macular degeneration, right eye: Secondary | ICD-10-CM | POA: Diagnosis not present

## 2021-08-18 ENCOUNTER — Emergency Department (HOSPITAL_BASED_OUTPATIENT_CLINIC_OR_DEPARTMENT_OTHER)
Admission: EM | Admit: 2021-08-18 | Discharge: 2021-08-18 | Disposition: A | Discharge status: Home / Self Care | Payer: Medicare HMO | Attending: Emergency Medicine | Admitting: Emergency Medicine

## 2021-08-18 ENCOUNTER — Other Ambulatory Visit: Payer: Self-pay

## 2021-08-18 ENCOUNTER — Encounter (HOSPITAL_BASED_OUTPATIENT_CLINIC_OR_DEPARTMENT_OTHER): Payer: Self-pay | Admitting: Urology

## 2021-08-18 DIAGNOSIS — R030 Elevated blood-pressure reading, without diagnosis of hypertension: Secondary | ICD-10-CM

## 2021-08-18 DIAGNOSIS — I10 Essential (primary) hypertension: Secondary | ICD-10-CM | POA: Insufficient documentation

## 2021-08-18 DIAGNOSIS — Z853 Personal history of malignant neoplasm of breast: Secondary | ICD-10-CM | POA: Insufficient documentation

## 2021-08-18 DIAGNOSIS — Z7982 Long term (current) use of aspirin: Secondary | ICD-10-CM | POA: Insufficient documentation

## 2021-08-18 DIAGNOSIS — Z79899 Other long term (current) drug therapy: Secondary | ICD-10-CM | POA: Diagnosis not present

## 2021-08-18 DIAGNOSIS — R350 Frequency of micturition: Secondary | ICD-10-CM | POA: Insufficient documentation

## 2021-08-18 LAB — BASIC METABOLIC PANEL
Anion gap: 6 (ref 5–15)
BUN: 22 mg/dL (ref 8–23)
CO2: 29 mmol/L (ref 22–32)
Calcium: 9.4 mg/dL (ref 8.9–10.3)
Chloride: 104 mmol/L (ref 98–111)
Creatinine, Ser: 0.96 mg/dL (ref 0.44–1.00)
GFR, Estimated: 56 mL/min — ABNORMAL LOW (ref >60)
Glucose, Bld: 109 mg/dL — ABNORMAL HIGH (ref 70–99)
Potassium: 3.7 mmol/L (ref 3.5–5.1)
Sodium: 139 mmol/L (ref 135–145)

## 2021-08-18 LAB — CBC
HCT: 38.7 % (ref 36.0–46.0)
Hemoglobin: 12.7 g/dL (ref 12.0–15.0)
MCH: 31.5 pg (ref 26.0–34.0)
MCHC: 32.8 g/dL (ref 30.0–36.0)
MCV: 96 fL (ref 80.0–100.0)
Platelets: 225 10*3/uL (ref 150–400)
RBC: 4.03 MIL/uL (ref 3.87–5.11)
RDW: 12.8 % (ref 11.5–15.5)
WBC: 8.1 10*3/uL (ref 4.0–10.5)
nRBC: 0 % (ref 0.0–0.2)

## 2021-08-18 LAB — URINALYSIS, ROUTINE W REFLEX MICROSCOPIC
Bilirubin Urine: NEGATIVE
Glucose, UA: NEGATIVE mg/dL
Hgb urine dipstick: NEGATIVE
Ketones, ur: NEGATIVE mg/dL
Leukocytes,Ua: NEGATIVE
Nitrite: NEGATIVE
Protein, ur: NEGATIVE mg/dL
Specific Gravity, Urine: 1.01 (ref 1.005–1.030)
pH: 6.5 (ref 5.0–8.0)

## 2021-08-18 NOTE — ED Notes (Signed)
Pt reports voiding multiple times while in waiting area. Bladder scan displays 52mL.

## 2021-08-18 NOTE — ED Notes (Signed)
Pt voided while waiting, spec sent to lab

## 2021-08-18 NOTE — ED Provider Notes (Signed)
[facility] EMERGENCY DEPARTMENT Provider Note   CSN: [id number] Arrival date & time: 08/18/21  1709     History Chief Complaint  Patient presents with   Urinary Retention    Amber Morgan is a 87 y.o. female brought in by her son for urinary complaints.  Patient states that last night she "urinated all night long."  Patient states when she woke up this morning she had a bowel movement and normal urination and then did not urinate all day until she came here at which point she urinated 5 times prior to being evaluated and give urinary sample.  She denies pain bladder distention, fever, chills, constipation.   HPI     Past Medical History:  Diagnosis Date   Allergy    Arthritis    Blind right eye    Cancer ([place])    breast cancer    Colon polyps    Diverticulitis    Diverticulosis 01/04/2014   Glaucoma    Hernia, inguinal, right    History of chicken pox    childhood   History of hiatal hernia    HTN (hypertension)    Hyperglycemia 09/05/2013   Lipoma of arm 12/09/2014   right   Nocturia 02/08/2013   Osteopenia 12/09/2014   Personal history of colonic polyps 09/05/2013   Pre-diabetes    Preventative health care 08/14/2016   Unspecified constipation 02/06/2013   Urinary incontinence    Viral infection characterized by skin and mucous membrane lesions 05/11/2013   Wears glasses     Patient Active Problem List   Diagnosis Date Noted   Hypercalcemia 05/27/2020   Right knee pain 01/22/2020   Varicose veins of lower extremity 08/21/2018   Nail lesion 08/19/2018   Superficial thrombophlebitis 06/12/2018   Grief 04/20/2018   Malignant neoplasm of upper-inner quadrant of right breast in female, estrogen receptor positive ([place]) 03/25/2018   Abdominal pain 12/12/2017   Right lower quadrant abdominal pain 11/27/2017   Diarrhea 02/02/2017   Urination frequency 02/02/2017   Tachycardia 02/02/2017   Preventative health care 08/14/2016   Bradycardia 11/27/2015    Hyponatremia 11/08/2015   Hypokalemia 11/08/2015   Tinnitus of right ear 08/28/2015   Medicare annual wellness visit, subsequent 07/08/2015   Osteopenia 12/09/2014   Lipoma of arm 12/09/2014   Diverticulosis 01/04/2014   Edema 09/05/2013   Personal history of colonic polyps 09/05/2013   Hyperglycemia 09/05/2013   Nocturia 02/08/2013   Constipation 02/06/2013   Paresthesia 04/26/2012   Hyperlipidemia 11/14/2011   HTN (hypertension) 09/06/2011   Glaucoma 09/06/2011    Past Surgical History:  Procedure Laterality Date   ABDOMINAL HYSTERECTOMY     APPENDECTOMY     BLADDER SURGERY  2003   bladder tact   BREAST BIOPSY Right 03/2018   BREAST LUMPECTOMY Right 04/2018   BREAST LUMPECTOMY WITH RADIOACTIVE SEED LOCALIZATION Right 04/12/2018   Procedure: BREAST LUMPECTOMY WITH RADIOACTIVE SEED LOCALIZATION;  Surgeon: [name], MD;  Location: [facility];  Service: General;  Laterality: Right;   CATARACT EXTRACTION, BILATERAL     COLONOSCOPY W/ BIOPSIES AND POLYPECTOMY     EYE SURGERY     multiple bilateral   INGUINAL HERNIA REPAIR Right 04/12/2018   Procedure: OPEN RIGHT INGUINAL HERNIA REPAIR WITH MESH;  Surgeon: [name], MD;  Location: [facility];  Service: General;  Laterality: Right;   RECTOCELE REPAIR Bilateral      OB History   No obstetric history on file.     Family History  Problem Relation Age of Onset   Colon cancer Mother    Hypertension Mother    Heart disease Father    Meniere's disease Father    Heart disease Sister    Arthritis Son        back   Glaucoma Son    Hypertension Son    Cataracts Son    Colon cancer Sister    Alzheimer's disease Sister    Glaucoma Sister    Glaucoma Sister    Hyperlipidemia Sister    Hypertension Sister    Cancer Sister        thyroid   Diabetes Sister    Glaucoma Sister    Breast cancer Paternal Aunt    Breast cancer Paternal Aunt     Social History   Tobacco Use   Smoking status: Never   Smokeless tobacco: Never   Vaping Use   Vaping Use: Never used  Substance Use Topics   Alcohol use: No   Drug use: No    Home Medications Prior to Admission medications   Medication Sig Start Date End Date Taking? Authorizing Provider  aspirin 81 MG EC tablet Take 81 mg by mouth daily. Swallow whole.    [provider]  calcium-vitamin D (OSCAL WITH D) 500-200 MG-UNIT tablet Take 1 tablet by mouth 2 (two) times daily.    [provider]  cholecalciferol (VITAMIN D) 1000 units tablet Take 1 tablet (1,000 Units total) by mouth daily. 05/20/18   Magrinat, Virgie Dad, MD  Difluprednate (DUREZOL OP) Place 1 drop into the right eye once.    [provider]  Fiber POWD Take 2 Scoops by mouth 3 (three) times daily. 2 teaspoon 3 times daily    [provider]  ketorolac (ACULAR) 0.5 % ophthalmic solution Place 1 drop into the right eye daily.    [provider]  Multiple Vitamins-Calcium (ONE-A-DAY WITHIN PO) Take 1 tablet by mouth daily.    [provider]  Polyethyl Glycol-Propyl Glycol (SYSTANE OP) Apply 1 drop to eye 3 (three) times daily. Put in both eyes    [provider]  Probiotic Product (PROBIOTIC DAILY PO) Take 1 capsule by mouth daily.     [provider]  Saccharomyces boulardii (PROBIOTIC) 250 MG CAPS Take 250 mg by mouth daily. 02/28/21   Mosie Lukes, MD  sodium chloride (MURO 128) 5 % ophthalmic ointment Place 1 application into the right eye at bedtime.    [provider]  sodium chloride (MURO 128) 5 % ophthalmic solution Place 1 drop into the right eye 2 (two) times daily. Use first then 10 minutes later uses Ketorolac    [provider]    Allergies    Amlodipine and Doxycycline  Review of Systems   Review of Systems Ten systems reviewed and are negative for acute change, except as noted in the HPI.   Physical Exam Updated Vital Signs BP (!) 180/49   Pulse 70   Temp 98 F (36.7 C) (Oral)   Resp 18   Ht  5' (1.524 m)   Wt 56.6 kg   SpO2 99%   BMI 24.37 kg/m   Physical Exam Vitals and nursing note reviewed.  Constitutional:      General: She is not in acute distress.    Appearance: She is well-developed. She is not diaphoretic.  HENT:     Head: Normocephalic and atraumatic.     Right Ear: External ear normal.     Left  Ear: External ear normal.     Nose: Nose normal.     Mouth/Throat:     Mouth: Mucous membranes are moist.  Eyes:     General: No scleral icterus.    Conjunctiva/sclera: Conjunctivae normal.  Cardiovascular:     Rate and Rhythm: Normal rate and regular rhythm.     Heart sounds: Normal heart sounds. No murmur heard.   No friction rub. No gallop.  Pulmonary:     Effort: Pulmonary effort is normal. No respiratory distress.     Breath sounds: Normal breath sounds.  Abdominal:     General: Bowel sounds are normal. There is no distension.     Palpations: Abdomen is soft. There is no mass.     Tenderness: There is no abdominal tenderness. There is no right CVA tenderness, left CVA tenderness or guarding.  Musculoskeletal:     Cervical back: Normal range of motion.  Skin:    General: Skin is warm and dry.  Neurological:     Mental Status: She is alert and oriented to person, place, and time.  Psychiatric:        Behavior: Behavior normal.    ED Results / Procedures / Treatments   Labs (all labs ordered are listed, but only abnormal results are displayed) Labs Reviewed  BASIC METABOLIC PANEL - Abnormal; Notable for the following components:      Result Value   Glucose, Bld 109 (*)    GFR, Estimated 56 (*)    All other components within normal limits  URINALYSIS, ROUTINE W REFLEX MICROSCOPIC  CBC    EKG None  Radiology No results found.  Procedures Procedures   Medications Ordered in ED Medications - No data to display  ED Course  I have reviewed the triage vital signs and the nursing notes.  Pertinent labs & imaging results that were available  during my care of the patient were reviewed by me and considered in my medical decision making (see chart for details).    MDM Rules/Calculators/A&P                         87 year old female here with urinary frequency.  Bedside urinary bladder scan shows minimal urine in the bladder with highest reading of 98 mL.  The patient has urinated multiple times here in the emergency department.  I reviewed labs including urinalysis which is negative for any abnormalities, BMP shows mildly elevated glucose of insignificant value and CBC is within normal limits. Patient is notably hypertensive here.  Her son states that she has never had any issues with high blood pressure however her allergy list includes amlodipine.  Patient is otherwise asymptomatic.  Do not feel that a person who has no history of hypertension and is in their 90s would be a good candidate to start new blood antihypertensive medications.  I discussed this with the patient and her son at bedside.  They are to follow-up with her PCP for further management.  Patient seen and shared visit with Dr. Doren Custard.  Patient appears appropriate for discharge at this time.  Discussed return precautions final Clinical Impression(s) / ED Diagnoses Final diagnoses:  Urinary frequency  Elevated blood pressure reading    Rx / DC Orders ED Discharge Orders     None        Margarita Mail, PA-C 08/18/21 2113    Godfrey Pick, MD 08/19/21 1732

## 2021-08-18 NOTE — ED Triage Notes (Signed)
Pt reports unable to urinate since this morning, pain in bladder area.  States was up all night urinating but since this morning has not gone

## 2021-08-18 NOTE — ED Notes (Signed)
Pt ambulatory to restroom

## 2021-08-18 NOTE — Discharge Instructions (Signed)
Contact a health care provider if: You start urinating more often. You feel pain or irritation when you urinate. You notice blood in your urine. Your urine looks cloudy. You develop a fever. You begin vomiting. Get help right away if: You are unable to urinate.

## 2021-08-19 ENCOUNTER — Ambulatory Visit (INDEPENDENT_AMBULATORY_CARE_PROVIDER_SITE_OTHER): Payer: Medicare HMO | Admitting: Family

## 2021-08-19 VITALS — BP 180/78 | HR 70 | Temp 97.5°F | Resp 12 | Ht 62.0 in | Wt 127.8 lb

## 2021-08-19 DIAGNOSIS — I10 Essential (primary) hypertension: Secondary | ICD-10-CM

## 2021-08-19 DIAGNOSIS — R3915 Urgency of urination: Secondary | ICD-10-CM | POA: Diagnosis not present

## 2021-08-19 DIAGNOSIS — R350 Frequency of micturition: Secondary | ICD-10-CM

## 2021-08-19 NOTE — Assessment & Plan Note (Addendum)
Advised pt of the following:  Start monitoring your blood pressure daily, around the same time of day, for the next 2-3 weeks.  Ensure that you have rested for 30 minutes prior to checking your blood pressure. Record your readings and bring them to your next visit. If cp palp and or sob go to er and or call 911.

## 2021-08-19 NOTE — Patient Instructions (Signed)
Start monitoring your blood pressure daily, around the same time of day, for the next 2-3 weeks.  Ensure that you have rested for 30 minutes prior to checking your blood pressure. Record your readings and bring them to your next visit.  Increase oral hydration with water throughout the day, and decrease intake of coffee and tea as these will dehydrate you further and increase urinary frequency.

## 2021-08-19 NOTE — Progress Notes (Signed)
Subjective:     Patient ID: Amber Morgan, female    DOB: 1927/04/28, 87 y.o.   MRN: [id number]  Chief Complaint  Patient presents with   Urinary Frequency    HPI Patient is in today for concerns with her blood pressure. Yesterday went to the ER and her blood pressure was 180/45 which concerned her. Prior to leaving the ER her son states her blood pressure had decreased to about 160/65. This am she checked her blood pressure at home and it was 120/65. She denies cp, palp, sob, headache, and or new blurry vision. She is not currently taking any medication for blood pressure. She did take losartan in the past, and has an allergy to amlodipine.    BP Readings from Last 3 Encounters:  08/19/21 (!) 180/78  08/18/21 (!) 161/58  06/17/21 122/68   Pulse Readings from Last 3 Encounters:  08/19/21 70  08/18/21 72  06/17/21 73   Has been experiencing chronic urinary urgency, stating at times gets up about 5 times a night. She states when the urge comes on she needs to rush to the bathroom. She went to ER yesterday because she was concerned that she was not urinating, but then went to the ER and have to void 5 times during the time she was there. Bedside bladder scan was unremarkable  u/a collected and unremarkable. Culture was not ordered. BMP and CBC were un-concerning. Pt does she state she drinks a few cups of coffee and tea during the day as well as cranberry juice. She doesn't really drink water.   Lab Results  Component Value Date   WBC 8.1 08/18/2021   HGB 12.7 08/18/2021   HCT 38.7 08/18/2021   MCV 96.0 08/18/2021   PLT 225 08/18/2021   Urine dip at ER yesterday was unremarkable.   This morning at home her blood pressure was 120/65.   Health Maintenance Due  Topic Date Due   COVID-19 Vaccine (5 - Booster for Moderna series) 04/17/2021    Past Medical History:  Diagnosis Date   Allergy    Arthritis    Blind right eye    Cancer ([place])    breast cancer    Colon polyps     Diverticulitis    Diverticulosis 01/04/2014   Glaucoma    Hernia, inguinal, right    History of chicken pox    childhood   History of hiatal hernia    HTN (hypertension)    Hyperglycemia 09/05/2013   Lipoma of arm 12/09/2014   right   Nocturia 02/08/2013   Osteopenia 12/09/2014   Personal history of colonic polyps 09/05/2013   Pre-diabetes    Preventative health care 08/14/2016   Unspecified constipation 02/06/2013   Urinary incontinence    Viral infection characterized by skin and mucous membrane lesions 05/11/2013   Wears glasses     Past Surgical History:  Procedure Laterality Date   ABDOMINAL HYSTERECTOMY     APPENDECTOMY     BLADDER SURGERY  2003   bladder tact   BREAST BIOPSY Right 03/2018   BREAST LUMPECTOMY Right 04/2018   BREAST LUMPECTOMY WITH RADIOACTIVE SEED LOCALIZATION Right 04/12/2018   Procedure: BREAST LUMPECTOMY WITH RADIOACTIVE SEED LOCALIZATION;  Surgeon: [name], MD;  Location: [facility];  Service: General;  Laterality: Right;   CATARACT EXTRACTION, BILATERAL     COLONOSCOPY W/ BIOPSIES AND POLYPECTOMY     EYE SURGERY     multiple bilateral   INGUINAL HERNIA REPAIR Right 04/12/2018  Procedure: OPEN RIGHT INGUINAL HERNIA REPAIR WITH MESH;  Surgeon: [name], MD;  Location: [facility];  Service: General;  Laterality: Right;   RECTOCELE REPAIR Bilateral     Family History  Problem Relation Age of Onset   Colon cancer Mother    Hypertension Mother    Heart disease Father    Meniere's disease Father    Heart disease Sister    Arthritis Son        back   Glaucoma Son    Hypertension Son    Cataracts Son    Colon cancer Sister    Alzheimer's disease Sister    Glaucoma Sister    Glaucoma Sister    Hyperlipidemia Sister    Hypertension Sister    Cancer Sister        thyroid   Diabetes Sister    Glaucoma Sister    Breast cancer Paternal Aunt    Breast cancer Paternal Aunt     Social History   Socioeconomic History   Marital status: Married     Spouse name: Not on file   Number of children: 4   Years of education: Not on file   Highest education level: Not on file  Occupational History   Not on file  Tobacco Use   Smoking status: Never   Smokeless tobacco: Never  Vaping Use   Vaping Use: Never used  Substance and Sexual Activity   Alcohol use: No   Drug use: No   Sexual activity: Not Currently  Other Topics Concern   Not on file  Social History Narrative   Not on file   Social Determinants of Health   Financial Resource Strain: Not on file  Food Insecurity: Not on file  Transportation Needs: Not on file  Physical Activity: Not on file  Stress: Not on file  Social Connections: Not on file  Intimate Partner Violence: Not on file    Outpatient Medications Prior to Visit  Medication Sig Dispense Refill   aspirin 81 MG EC tablet Take 81 mg by mouth daily. Swallow whole.     calcium-vitamin D (OSCAL WITH D) 500-200 MG-UNIT tablet Take 1 tablet by mouth 2 (two) times daily.     cholecalciferol (VITAMIN D) 1000 units tablet Take 1 tablet (1,000 Units total) by mouth daily.     Difluprednate (DUREZOL OP) Place 1 drop into the right eye once.     Fiber POWD Take 2 Scoops by mouth 2 (two) times daily.     ketorolac (ACULAR) 0.5 % ophthalmic solution Place 1 drop into the right eye daily.     Multiple Vitamins-Calcium (ONE-A-DAY WITHIN PO) Take 1 tablet by mouth daily.     Polyethyl Glycol-Propyl Glycol (SYSTANE OP) Place 1 drop into both eyes 3 (three) times daily. Put in both eyes     Probiotic Product (PROBIOTIC DAILY PO) Take 1 capsule by mouth daily.      Saccharomyces boulardii (PROBIOTIC) 250 MG CAPS Take 250 mg by mouth daily. 90 capsule 2   sodium chloride (MURO 128) 5 % ophthalmic ointment Place 1 application into the right eye at bedtime.     sodium chloride (MURO 128) 5 % ophthalmic solution Place 1 drop into the right eye 2 (two) times daily. Use first then 10 minutes later uses Ketorolac     No  facility-administered medications prior to visit.    Allergies  Allergen Reactions   Amlodipine Other (See Comments)    Mental status chnages ie confusion  Doxycycline Rash    Review of Systems  Constitutional:  Negative for chills and fever.  Eyes:  Negative for blurred vision and double vision.  Cardiovascular:  Positive for leg swelling (bil leg swelling normal per pt, no recent worsening). Negative for chest pain and palpitations.  Genitourinary:  Positive for frequency (nocturia, 3-5 times nightly. chronic) and urgency (chronic). Negative for dysuria, flank pain and hematuria.  Neurological:  Negative for headaches.      Objective:    Physical Exam Constitutional:      Appearance: Normal appearance.  Abdominal:     Tenderness: There is no abdominal tenderness.  Musculoskeletal:     Thoracic back: No tenderness.     Lumbar back: No tenderness.  Neurological:     Mental Status: She is alert.    BP (!) 180/78 Comment: right arm  Pulse 70   Temp (!) 97.5 F (36.4 C) (Oral)   Resp 12   Ht 5\' 2"  (1.575 m)   Wt 127 lb 12.8 oz (58 kg)   SpO2 97%   BMI 23.37 kg/m  Wt Readings from Last 3 Encounters:  08/19/21 127 lb 12.8 oz (58 kg)  08/18/21 124 lb 12.5 oz (56.6 kg)  06/17/21 124 lb 12.8 oz (56.6 kg)       Assessment & Plan:   Problem List Items Addressed This Visit       Cardiovascular and Mediastinum   HTN (hypertension) - Primary    New. Advised pt of the following:  Start monitoring your blood pressure daily, around the same time of day, for the next 2-3 weeks.  Ensure that you have rested for 30 minutes prior to checking your blood pressure. Record your readings and bring them to your next visit. If cp palp and or sob go to er and or call 911.         Other   Urination frequency    Ordered u/a c&s today, pending results.  If positive, plan to treat. If negative, consider referral to urology.       Other Visit Diagnoses     Urinary urgency        Relevant Orders   Urine Culture       I am having Amber Morgan maintain her Probiotic Product (PROBIOTIC DAILY PO), Fiber, cholecalciferol, Polyethyl Glycol-Propyl Glycol (SYSTANE OP), Difluprednate (DUREZOL OP), Multiple Vitamins-Calcium (ONE-A-DAY WITHIN PO), aspirin, ketorolac, sodium chloride, sodium chloride, calcium-vitamin D, and Probiotic.  No orders of the defined types were placed in this encounter.

## 2021-08-19 NOTE — Assessment & Plan Note (Signed)
Ordered u/a c&s today, pending results.  If positive, plan to treat. If negative, consider referral to urology.

## 2021-08-20 ENCOUNTER — Encounter (HOSPITAL_BASED_OUTPATIENT_CLINIC_OR_DEPARTMENT_OTHER): Payer: Self-pay

## 2021-08-20 ENCOUNTER — Other Ambulatory Visit: Payer: Self-pay

## 2021-08-20 ENCOUNTER — Emergency Department (HOSPITAL_BASED_OUTPATIENT_CLINIC_OR_DEPARTMENT_OTHER)
Admission: EM | Admit: 2021-08-20 | Discharge: 2021-08-20 | Disposition: A | Discharge status: Home / Self Care | Payer: Medicare HMO | Attending: Emergency Medicine | Admitting: Emergency Medicine

## 2021-08-20 ENCOUNTER — Emergency Department (HOSPITAL_BASED_OUTPATIENT_CLINIC_OR_DEPARTMENT_OTHER): Payer: Medicare HMO

## 2021-08-20 DIAGNOSIS — S0083XA Contusion of other part of head, initial encounter: Secondary | ICD-10-CM | POA: Diagnosis not present

## 2021-08-20 DIAGNOSIS — I10 Essential (primary) hypertension: Secondary | ICD-10-CM | POA: Diagnosis not present

## 2021-08-20 DIAGNOSIS — Z7982 Long term (current) use of aspirin: Secondary | ICD-10-CM | POA: Insufficient documentation

## 2021-08-20 DIAGNOSIS — G319 Degenerative disease of nervous system, unspecified: Secondary | ICD-10-CM | POA: Diagnosis not present

## 2021-08-20 DIAGNOSIS — Z853 Personal history of malignant neoplasm of breast: Secondary | ICD-10-CM | POA: Insufficient documentation

## 2021-08-20 DIAGNOSIS — S0990XA Unspecified injury of head, initial encounter: Secondary | ICD-10-CM | POA: Diagnosis not present

## 2021-08-20 DIAGNOSIS — M7989 Other specified soft tissue disorders: Secondary | ICD-10-CM | POA: Diagnosis not present

## 2021-08-20 DIAGNOSIS — W01198A Fall on same level from slipping, tripping and stumbling with subsequent striking against other object, initial encounter: Secondary | ICD-10-CM | POA: Insufficient documentation

## 2021-08-20 DIAGNOSIS — Y92000 Kitchen of unspecified non-institutional (private) residence as  the place of occurrence of the external cause: Secondary | ICD-10-CM | POA: Insufficient documentation

## 2021-08-20 DIAGNOSIS — I739 Peripheral vascular disease, unspecified: Secondary | ICD-10-CM | POA: Diagnosis not present

## 2021-08-20 LAB — URINE CULTURE
MICRO NUMBER:: 12639140
Result:: NO GROWTH
SPECIMEN QUALITY:: ADEQUATE

## 2021-08-20 NOTE — ED Provider Notes (Signed)
[facility] EMERGENCY DEPARTMENT Provider Note   CSN: [id number] Arrival date & time: 08/20/21  1437     History Chief Complaint  Patient presents with   Amber Morgan Amber Morgan is a 87 y.o. female.  HPI Patient is a 87 year old female with a past medical history detailed below she presented to the ER today after her left heel slipped out of her shoe and caused her to fall to the left where she smacked the left side of her head on the handle of the stove in her kitchen.  She states she did not get knocked out she was able to stand up after the fall and walk around.  She states that this occurred this afternoon approximately an hour or so prior to arrival in the emergency room.  She denies any nausea vomiting slurred speech confusion states that she feels well currently has mild headache but no other symptoms.  She denies any neck pain or difficulty moving her head.  No other associate symptoms. She has not taken any medications prior to arrival     Past Medical History:  Diagnosis Date   Allergy    Arthritis    Blind right eye    Cancer [facility])    breast cancer    Colon polyps    Diverticulitis    Diverticulosis 01/04/2014   Glaucoma    Hernia, inguinal, right    History of chicken pox    childhood   History of hiatal hernia    HTN (hypertension)    Hyperglycemia 09/05/2013   Lipoma of arm 12/09/2014   right   Nocturia 02/08/2013   Osteopenia 12/09/2014   Personal history of colonic polyps 09/05/2013   Pre-diabetes    Preventative health care 08/14/2016   Unspecified constipation 02/06/2013   Urinary incontinence    Viral infection characterized by skin and mucous membrane lesions 05/11/2013   Wears glasses     Patient Active Problem List   Diagnosis Date Noted   Hypercalcemia 05/27/2020   Right knee pain 01/22/2020   Varicose veins of lower extremity 08/21/2018   Nail lesion 08/19/2018   Superficial thrombophlebitis 06/12/2018   Grief 04/20/2018   Malignant  neoplasm of upper-inner quadrant of right breast in female, estrogen receptor positive ([place]) 03/25/2018   Abdominal pain 12/12/2017   Right lower quadrant abdominal pain 11/27/2017   Diarrhea 02/02/2017   Urination frequency 02/02/2017   Tachycardia 02/02/2017   Preventative health care 08/14/2016   Bradycardia 11/27/2015   Hyponatremia 11/08/2015   Hypokalemia 11/08/2015   Tinnitus of right ear 08/28/2015   Medicare annual wellness visit, subsequent 07/08/2015   Osteopenia 12/09/2014   Lipoma of arm 12/09/2014   Diverticulosis 01/04/2014   Edema 09/05/2013   Personal history of colonic polyps 09/05/2013   Hyperglycemia 09/05/2013   Nocturia 02/08/2013   Constipation 02/06/2013   Paresthesia 04/26/2012   Hyperlipidemia 11/14/2011   HTN (hypertension) 09/06/2011   Glaucoma 09/06/2011    Past Surgical History:  Procedure Laterality Date   ABDOMINAL HYSTERECTOMY     APPENDECTOMY     BLADDER SURGERY  2003   bladder tact   BREAST BIOPSY Right 03/2018   BREAST LUMPECTOMY Right 04/2018   BREAST LUMPECTOMY WITH RADIOACTIVE SEED LOCALIZATION Right 04/12/2018   Procedure: BREAST LUMPECTOMY WITH RADIOACTIVE SEED LOCALIZATION;  Surgeon: [name], MD;  Location: [facility];  Service: General;  Laterality: Right;   CATARACT EXTRACTION, BILATERAL     COLONOSCOPY W/ BIOPSIES AND POLYPECTOMY  EYE SURGERY     multiple bilateral   INGUINAL HERNIA REPAIR Right 04/12/2018   Procedure: OPEN RIGHT INGUINAL HERNIA REPAIR WITH MESH;  Surgeon: [name], MD;  Location: [facility];  Service: General;  Laterality: Right;   RECTOCELE REPAIR Bilateral      OB History   No obstetric history on file.     Family History  Problem Relation Age of Onset   Colon cancer Mother    Hypertension Mother    Heart disease Father    Meniere's disease Father    Heart disease Sister    Arthritis Son        back   Glaucoma Son    Hypertension Son    Cataracts Son    Colon cancer Sister    Alzheimer's  disease Sister    Glaucoma Sister    Glaucoma Sister    Hyperlipidemia Sister    Hypertension Sister    Cancer Sister        thyroid   Diabetes Sister    Glaucoma Sister    Breast cancer Paternal Aunt    Breast cancer Paternal Aunt     Social History   Tobacco Use   Smoking status: Never   Smokeless tobacco: Never  Vaping Use   Vaping Use: Never used  Substance Use Topics   Alcohol use: No   Drug use: No    Home Medications Prior to Admission medications   Medication Sig Start Date End Date Taking? Authorizing Provider  aspirin 81 MG EC tablet Take 81 mg by mouth daily. Swallow whole.    [provider]  calcium-vitamin D (OSCAL WITH D) 500-200 MG-UNIT tablet Take 1 tablet by mouth 2 (two) times daily.    [provider]  cholecalciferol (VITAMIN D) 1000 units tablet Take 1 tablet (1,000 Units total) by mouth daily. 05/20/18   Magrinat, Virgie Dad, MD  Difluprednate (DUREZOL OP) Place 1 drop into the right eye once.    [provider]  Fiber POWD Take 2 Scoops by mouth 2 (two) times daily.    [provider]  ketorolac (ACULAR) 0.5 % ophthalmic solution Place 1 drop into the right eye daily.    [provider]  Multiple Vitamins-Calcium (ONE-A-DAY WITHIN PO) Take 1 tablet by mouth daily.    [provider]  Polyethyl Glycol-Propyl Glycol (SYSTANE OP) Place 1 drop into both eyes 3 (three) times daily. Put in both eyes    [provider]  Probiotic Product (PROBIOTIC DAILY PO) Take 1 capsule by mouth daily.     [provider]  Saccharomyces boulardii (PROBIOTIC) 250 MG CAPS Take 250 mg by mouth daily. 02/28/21   Mosie Lukes, MD  sodium chloride (MURO 128) 5 % ophthalmic ointment Place 1 application into the right eye at bedtime.    [provider]  sodium chloride (MURO 128) 5 % ophthalmic solution Place 1 drop into the right eye 2 (two) times daily. Use first then 10 minutes later uses Ketorolac     [provider]    Allergies    Amlodipine and Doxycycline  Review of Systems   Review of Systems  Constitutional:  Negative for chills and fever.  HENT:  Negative for congestion.   Eyes:  Negative for pain.  Respiratory:  Negative for cough and shortness of breath.   Cardiovascular:  Negative for chest pain and leg swelling.  Gastrointestinal:  Negative for abdominal pain and vomiting.  Genitourinary:  Negative for dysuria.  Musculoskeletal:  Negative for myalgias.  Skin:  Negative for rash.  Neurological:  Positive for headaches. Negative for dizziness.   Physical Exam Updated Vital Signs BP (!) 150/73 (BP Location: Right Arm)   Pulse 100   Temp 98.4 F (36.9 C) (Oral)   Resp 18   SpO2 99%   Physical Exam Vitals and nursing note reviewed.  Constitutional:      General: She is not in acute distress. HENT:     Head: Normocephalic.     Comments: Approximately 2 cm contusion to left side of head temporal zone    Nose: Nose normal.     Mouth/Throat:     Mouth: Mucous membranes are moist.  Eyes:     General: No scleral icterus. Neck:     Comments: No midline cervical spine tenderness palpation.  No tenderness palpation of posterior neck Cardiovascular:     Rate and Rhythm: Normal rate and regular rhythm.     Pulses: Normal pulses.     Heart sounds: Normal heart sounds.  Pulmonary:     Effort: Pulmonary effort is normal. No respiratory distress.     Breath sounds: No wheezing.  Abdominal:     Palpations: Abdomen is soft.     Tenderness: There is no abdominal tenderness. There is no guarding or rebound.  Musculoskeletal:     Cervical back: Normal range of motion.     Right lower leg: No edema.     Left lower leg: No edema.     Comments: No bony tenderness over joints or long bones of the upper and lower extremities.    No neck or back midline tenderness, step-off, deformity, or bruising. Able to turn head left and right 45 degrees without  difficulty.  Full range of motion of upper and lower extremity joints shown after palpation was conducted; with 5/5 symmetrical strength in upper and lower extremities. No chest wall tenderness, no facial or cranial tenderness.   Patient has intact sensation grossly in lower and upper extremities. Intact patellar and ankle reflexes. Patient able to ambulate without difficulty.  Radial and DP pulses palpated BL.   Skin:    General: Skin is warm and dry.     Capillary Refill: Capillary refill takes less than 2 seconds.  Neurological:     Mental Status: She is alert. Mental status is at baseline.     Comments: Alert and oriented to self, place, time and event.   Speech is fluent, clear without dysarthria or dysphasia.   Strength 5/5 in upper/lower extremities   Sensation intact in upper/lower extremities    Normal finger-to-nose and feet tapping.  CN I not tested  CN II grossly intact visual fields bilaterally. Did not visualize posterior eye.  CN III, IV, VI PERRLA and EOMs intact bilaterally  CN V Intact sensation to sharp and light touch to the face  CN VII facial movements symmetric  CN VIII not tested  CN IX, X no uvula deviation, symmetric rise of soft palate  CN XI 5/5 SCM and trapezius strength bilaterally  CN XII Midline tongue protrusion, symmetric L/R movements   Psychiatric:        Mood and Affect: Mood normal.        Behavior: Behavior normal.    ED Results / Procedures / Treatments   Labs (all labs ordered are listed, but only abnormal results are displayed) Labs Reviewed - No data to display  EKG None  Radiology CT Head Wo Contrast  Result Date:  08/20/2021 CLINICAL DATA:  Head trauma, minor (Age >= 65y) head trauma to left side of head - no headache EXAM: CT HEAD WITHOUT CONTRAST TECHNIQUE: Contiguous axial images were obtained from the base of the skull through the vertex without intravenous contrast. COMPARISON:  None. FINDINGS: Brain: There is atrophy and  chronic small vessel disease changes. No acute intracranial abnormality. Specifically, no hemorrhage, hydrocephalus, mass lesion, acute infarction, or significant intracranial injury. Vascular: No hyperdense vessel or unexpected calcification. Skull: No acute calvarial abnormality. Sinuses/Orbits: No acute findings Other: Soft tissue swelling in the left lateral scalp. IMPRESSION: Atrophy, chronic microvascular disease. No acute intracranial abnormality. Electronically Signed   By: Rolm Baptise M.D.   On: 08/20/2021 17:05    Procedures Procedures   Medications Ordered in ED Medications - No data to display  ED Course  I have reviewed the triage vital signs and the nursing notes.  Pertinent labs & imaging results that were available during my care of the patient were reviewed by me and considered in my medical decision making (see chart for details).    MDM Rules/Calculators/A&P                          Patient had a mechanical fall that occurred earlier today.  Was able to get up and walk around afterwards.  Here because of some mild headache and because son is concerned.  Patient had a urinalysis and BMP and CBC 2 days ago which was unremarkable. Patient is neurologically intact well-appearing on physical exam vital signs within normal limits  CT scan obtained and unremarkable.  I discussed this case with my attending physician who cosigned this note including patient's presenting symptoms, physical exam, and planned diagnostics and interventions. Attending physician stated agreement with plan or made changes to plan which were implemented.   Attending physician assessed patient at bedside.   Patient ambulatory and discharged home.  Questions answered to the best of my ability.  Final Clinical Impression(s) / ED Diagnoses Final diagnoses:  Contusion of other part of head, initial encounter    Rx / DC Orders ED Discharge Orders     None        Tedd Sias, [place] 08/20/21  1718    Lucrezia Starch, MD 08/20/21 [phone]

## 2021-08-20 NOTE — Discharge Instructions (Addendum)
The CT scan of your head was without any bleeding.  Please follow-up with your primary care provider.  Take Tylenol [phone] mg every 6 hours as needed for pain.  You can apply ice to your scalp as well this can help with the swelling and pain.

## 2021-08-20 NOTE — ED Triage Notes (Addendum)
Pt states she tripped/fell in her kitchen ~12pm-son states she told him she struck head on stove-no break in skin noted-pain sites left parietal/occipital area-no LOC-pt also c/o lower back pain prior to fall-NAD-to triage in w/c

## 2021-08-21 ENCOUNTER — Telehealth: Payer: Self-pay | Admitting: Family

## 2021-08-21 NOTE — Telephone Encounter (Signed)
Tried calling the patient and he did not answer. LVM for patient to call back.   

## 2021-08-21 NOTE — Telephone Encounter (Signed)
Pt urine culture negative. Please call pt and let her know. She has a f/u appt with pcp 11/23  How have her blood pressure average been running? Is she having increasing or worsening urgency/frequency of urine or is that better?

## 2021-08-21 NOTE — Telephone Encounter (Addendum)
Patient's son called back and stated that her urgency and her frequency has came down. Patient's son stated that her Blood pressure has been in between 135 and 140's over high 70's into 80's. They were informed if they needed anything to let us know and give [place] a call. Patient's son stated understanding and appreciation for the call.

## 2021-08-27 ENCOUNTER — Telehealth: Payer: Self-pay | Admitting: Family Medicine

## 2021-08-27 ENCOUNTER — Ambulatory Visit: Payer: Medicare HMO

## 2021-08-27 ENCOUNTER — Ambulatory Visit (INDEPENDENT_AMBULATORY_CARE_PROVIDER_SITE_OTHER): Payer: Medicare HMO | Admitting: Medical

## 2021-08-27 ENCOUNTER — Other Ambulatory Visit: Payer: Self-pay

## 2021-08-27 VITALS — BP 129/74 | HR 67 | Resp 12

## 2021-08-27 DIAGNOSIS — H43391 Other vitreous opacities, right eye: Secondary | ICD-10-CM

## 2021-08-27 DIAGNOSIS — H547 Unspecified visual loss: Secondary | ICD-10-CM | POA: Diagnosis not present

## 2021-08-27 DIAGNOSIS — S0003XD Contusion of scalp, subsequent encounter: Secondary | ICD-10-CM

## 2021-08-27 DIAGNOSIS — R030 Elevated blood-pressure reading, without diagnosis of hypertension: Secondary | ICD-10-CM | POA: Diagnosis not present

## 2021-08-27 NOTE — Patient Instructions (Signed)
Decreasing visual acuity described over the past 2 to 3 months.  Vision intact but could be improved.  Recent new onset floater right side visual field.  Decided to go ahead and refer retinal specialist Dr. Baird Cancer.  Asking referral staff to call their office today and send over the referral.  Also asking patient to call over to Dr. Baird Cancer office in light of the fact that he lives Wednesday before Thanksgiving.  Hopefully they could see her today or sooner if they might reopen after Thanksgiving.  If any worsening vision changes or worsening floaters then be seen in the emergency department over the long weekend.  Head contusion recently after fall.  CT of head was negative.  Gross motor and sensory function intact.  Elevated blood pressure recently but blood pressure today was in the good range.  Also at home blood pressures well controlled.  Prior reading when high was likely whitecoat hypertension.  Follow-up as regular scheduled with Dr. Charlett Blake or sooner if needed.

## 2021-08-27 NOTE — Telephone Encounter (Signed)
[facility] called and could not find referral and asked if we could resend it to their back fax, [phone]

## 2021-08-27 NOTE — Progress Notes (Signed)
Subjective:    Patient ID: Amber Morgan, female    DOB: January 03, 1927, 87 y.o.   MRN: [id number]  HPI  Pt in for follow up.  Last visit with NP in our office had high blood pressure. Pt bp readings at home have been in 130/80 range. Pt has some white coat htn.   Pt has some decrease vision over past 2-3 months. Today on check both eyes 20/70. Rt eye 20/100. Both eyes- 20/70.  Pt sees [facility]inal specialist.  Dr. Jerline Morgan and sees Amber Morgan. Middle of January(the 18th). Pt not reporting any light flashes. Pt does report rt visual field new small floater since 07-21-21.   Also follow up from ED in " below.  Arrival date & time: 08/20/21  1437     History    Chief Complaint  Patient presents with   Amber Morgan is a 87 y.o. female.   "HPI Patient is a 87 year old female with a past medical history detailed below she presented to the ER today after her left heel slipped out of her shoe and caused her to fall to the left where she smacked the left side of her head on the handle of the stove in her kitchen.  She states she did not get knocked out she was able to stand up after the fall and walk around.  She states that this occurred this afternoon approximately an hour or so prior to arrival in the emergency room.  She denies any nausea vomiting slurred speech confusion states that she feels well currently has mild headache but no other symptoms.   She denies any neck Morgan or difficulty moving her head.  No other associate symptoms. She has not taken any medications prior to arrival"    "MDM Rules/Calculators/A&P                           Patient had a mechanical fall that occurred earlier today.  Was able to get up and walk around afterwards.  Here because of some mild headache and because son is concerned.   Patient had a urinalysis and BMP and CBC 2 days ago which was unremarkable. Patient is neurologically intact well-appearing on physical exam vital  signs within normal limits   CT scan obtained and unremarkable.   I discussed this case with my attending physician who cosigned this note including patient's presenting symptoms, physical exam, and planned diagnostics and interventions. Attending physician stated agreement with plan or made changes to plan which were implemented.    Attending physician assessed patient at bedside.    Patient ambulatory and discharged home.  Questions answered to the best of my ability."    Review of Systems  Constitutional:  Negative for chills, fatigue and fever.  HENT:  Negative for congestion.   Eyes:  Positive for visual disturbance.  Respiratory:  Negative for cough, chest tightness, shortness of breath and wheezing.   Cardiovascular:  Negative for chest Morgan.  Gastrointestinal:  Negative for abdominal Morgan.  Musculoskeletal:  Negative for back Morgan.  Skin:  Negative for rash.  Neurological:  Negative for dizziness, speech difficulty, weakness, numbness and headaches.  Hematological:  Negative for adenopathy. Does not bruise/bleed easily.  Psychiatric/Behavioral:  Negative for agitation, behavioral problems and decreased concentration. The patient is not nervous/anxious.     Past Medical History:  Diagnosis Date   Allergy    Arthritis  Blind right eye    Cancer ([place])    breast cancer    Colon polyps    Diverticulitis    Diverticulosis 01/04/2014   Glaucoma    Hernia, inguinal, right    History of chicken pox    childhood   History of hiatal hernia    HTN (hypertension)    Hyperglycemia 09/05/2013   Lipoma of arm 12/09/2014   right   Nocturia 02/08/2013   Osteopenia 12/09/2014   Personal history of colonic polyps 09/05/2013   Pre-diabetes    Preventative health care 08/14/2016   Unspecified constipation 02/06/2013   Urinary incontinence    Viral infection characterized by skin and mucous membrane lesions 05/11/2013   Wears glasses      Social History   Socioeconomic History    Marital status: Married    Spouse name: Not on file   Number of children: 4   Years of education: Not on file   Highest education level: Not on file  Occupational History   Not on file  Tobacco Use   Smoking status: Never   Smokeless tobacco: Never  Vaping Use   Vaping Use: Never used  Substance and Sexual Activity   Alcohol use: No   Drug use: No   Sexual activity: Not Currently  Other Topics Concern   Not on file  Social History Narrative   Not on file   Social Determinants of Health   Financial Resource Strain: Not on file  Food Insecurity: Not on file  Transportation Needs: Not on file  Physical Activity: Not on file  Stress: Not on file  Social Connections: Not on file  Intimate Partner Violence: Not on file    Past Surgical History:  Procedure Laterality Date   ABDOMINAL HYSTERECTOMY     APPENDECTOMY     BLADDER SURGERY  2003   bladder tact   BREAST BIOPSY Right 03/2018   BREAST LUMPECTOMY Right 04/2018   BREAST LUMPECTOMY WITH RADIOACTIVE SEED LOCALIZATION Right 04/12/2018   Procedure: BREAST LUMPECTOMY WITH RADIOACTIVE SEED LOCALIZATION;  Surgeon: Amber Klein, MD;  Location: [facility];  Service: General;  Laterality: Right;   CATARACT EXTRACTION, BILATERAL     COLONOSCOPY W/ BIOPSIES AND POLYPECTOMY     EYE SURGERY     multiple bilateral   INGUINAL HERNIA REPAIR Right 04/12/2018   Procedure: OPEN RIGHT INGUINAL HERNIA REPAIR WITH MESH;  Surgeon: Amber Klein, MD;  Location: [facility];  Service: General;  Laterality: Right;   RECTOCELE REPAIR Bilateral     Family History  Problem Relation Age of Onset   Colon cancer Mother    Hypertension Mother    Heart disease Father    Meniere's disease Father    Heart disease Sister    Arthritis Son        back   Glaucoma Son    Hypertension Son    Cataracts Son    Colon cancer Sister    Alzheimer's disease Sister    Glaucoma Sister    Glaucoma Sister    Hyperlipidemia Sister    Hypertension Sister    Cancer  Sister        thyroid   Diabetes Sister    Glaucoma Sister    Breast cancer Paternal Aunt    Breast cancer Paternal Aunt     Allergies  Allergen Reactions   Amlodipine Other (See Comments)    Mental status chnages ie confusion   Doxycycline Rash    Current Outpatient Medications on File  Prior to Visit  Medication Sig Dispense Refill   aspirin 81 MG EC tablet Take 81 mg by mouth daily. Swallow whole.     calcium-vitamin D (OSCAL WITH D) 500-200 MG-UNIT tablet Take 1 tablet by mouth 2 (two) times daily.     cholecalciferol (VITAMIN D) 1000 units tablet Take 1 tablet (1,000 Units total) by mouth daily.     Difluprednate (DUREZOL OP) Place 1 drop into the right eye once.     Fiber POWD Take 2 Scoops by mouth 2 (two) times daily.     ketorolac (ACULAR) 0.5 % ophthalmic solution Place 1 drop into the right eye daily.     Multiple Vitamins-Calcium (ONE-A-DAY WITHIN PO) Take 1 tablet by mouth daily.     Polyethyl Glycol-Propyl Glycol (SYSTANE OP) Place 1 drop into both eyes 3 (three) times daily. Put in both eyes     Probiotic Product (PROBIOTIC DAILY PO) Take 1 capsule by mouth daily.      Saccharomyces boulardii (PROBIOTIC) 250 MG CAPS Take 250 mg by mouth daily. 90 capsule 2   sodium chloride (MURO 128) 5 % ophthalmic ointment Place 1 application into the right eye at bedtime.     sodium chloride (MURO 128) 5 % ophthalmic solution Place 1 drop into the right eye 2 (two) times daily. Use first then 10 minutes later uses Ketorolac     No current facility-administered medications on file prior to visit.    BP 129/74 (BP Location: Left Arm, Cuff Size: Normal)   Pulse 67   Resp 12   SpO2 97%       Objective:   Physical Exam  General Mental Status- Alert. General Appearance- Not in acute distress.   Skin General: Color- Normal Color. Moisture- Normal Moisture.  Neck Carotid Arteries- Normal color. Moisture- Normal Moisture. No carotid bruits. No JVD.  Chest and Lung  Exam Auscultation: Breath Sounds:-Normal.  Cardiovascular Auscultation:Rythm- Regular. Murmurs & Other Heart Sounds:Auscultation of the heart reveals- No Murmurs.  Abdomen Inspection:-Inspeection Normal. Palpation/Percussion:Note:No mass. Palpation and Percussion of the abdomen reveal- Non Tender, Non Distended + BS, no rebound or guarding.    Neurologic Cranial Nerve exam:- CN III-XII intact(No nystagmus), symmetric smile. Drift Test:- No drift. Finger to Nose:- Normal/Intact Strength:- 5/5 equal and symmetric strength both upper and lower extremities.   Head- left side head/parietal area small nontender contusion.  Eyes- peerl bilateral.     Assessment & Plan:  Fritz Pickerel- [phone].   Patient Instructions  Decreasing visual acuity described over the past 2 to 3 months.  Vision intact but could be improved.  Recent new onset floater right side visual field.  Decided to go ahead and refer retinal specialist Dr. Baird Cancer.  Asking referral staff to call their office today and send over the referral.  Also asking patient to call over to Dr. Baird Cancer office in light of the fact that he lives Wednesday before Thanksgiving.  Hopefully they could see her today or sooner if they might reopen after Thanksgiving.  If any worsening vision changes or worsening floaters then be seen in the emergency department over the long weekend.  Head contusion recently after fall.  CT of head was negative.  Gross motor and sensory function intact.  Elevated blood pressure recently but blood pressure today was in the good range.  Also at home blood pressures well controlled.  Prior reading when high was likely whitecoat hypertension.  Follow-up as regular scheduled with Dr. Charlett Blake or sooner if needed.   Mackie Pai,  PA-C   Time spent with patient today was 31  minutes which consisted of chart review, discussing diagnosis, work up, treatment, answerig questions  and documentation.

## 2021-09-02 DIAGNOSIS — H35371 Puckering of macula, right eye: Secondary | ICD-10-CM | POA: Diagnosis not present

## 2021-09-02 DIAGNOSIS — H35361 Drusen (degenerative) of macula, right eye: Secondary | ICD-10-CM | POA: Diagnosis not present

## 2021-09-02 DIAGNOSIS — H43811 Vitreous degeneration, right eye: Secondary | ICD-10-CM | POA: Diagnosis not present

## 2021-09-02 DIAGNOSIS — H44522 Atrophy of globe, left eye: Secondary | ICD-10-CM | POA: Diagnosis not present

## 2021-09-21 ENCOUNTER — Emergency Department (HOSPITAL_BASED_OUTPATIENT_CLINIC_OR_DEPARTMENT_OTHER)
Admission: EM | Admit: 2021-09-21 | Discharge: 2021-09-21 | Disposition: A | Discharge status: Home / Self Care | Payer: Medicare HMO | Attending: Emergency Medicine | Admitting: Emergency Medicine

## 2021-09-21 ENCOUNTER — Other Ambulatory Visit: Payer: Self-pay

## 2021-09-21 ENCOUNTER — Encounter (HOSPITAL_BASED_OUTPATIENT_CLINIC_OR_DEPARTMENT_OTHER): Payer: Self-pay | Admitting: Emergency Medicine

## 2021-09-21 DIAGNOSIS — Z79899 Other long term (current) drug therapy: Secondary | ICD-10-CM | POA: Diagnosis not present

## 2021-09-21 DIAGNOSIS — R350 Frequency of micturition: Secondary | ICD-10-CM | POA: Diagnosis not present

## 2021-09-21 DIAGNOSIS — I10 Essential (primary) hypertension: Secondary | ICD-10-CM | POA: Insufficient documentation

## 2021-09-21 DIAGNOSIS — Z7982 Long term (current) use of aspirin: Secondary | ICD-10-CM | POA: Insufficient documentation

## 2021-09-21 DIAGNOSIS — Z853 Personal history of malignant neoplasm of breast: Secondary | ICD-10-CM | POA: Diagnosis not present

## 2021-09-21 LAB — CBC WITH DIFFERENTIAL/PLATELET
Abs Immature Granulocytes: 0.03 10*3/uL (ref 0.00–0.07)
Basophils Absolute: 0 10*3/uL (ref 0.0–0.1)
Basophils Relative: 0 %
Eosinophils Absolute: 0.1 10*3/uL (ref 0.0–0.5)
Eosinophils Relative: 1 %
HCT: 38.5 % (ref 36.0–46.0)
Hemoglobin: 12.9 g/dL (ref 12.0–15.0)
Immature Granulocytes: 0 %
Lymphocytes Relative: 25 %
Lymphs Abs: 2 10*3/uL (ref 0.7–4.0)
MCH: 31.7 pg (ref 26.0–34.0)
MCHC: 33.5 g/dL (ref 30.0–36.0)
MCV: 94.6 fL (ref 80.0–100.0)
Monocytes Absolute: 0.6 10*3/uL (ref 0.1–1.0)
Monocytes Relative: 8 %
Neutro Abs: 5.3 10*3/uL (ref 1.7–7.7)
Neutrophils Relative %: 66 %
Platelets: 234 10*3/uL (ref 150–400)
RBC: 4.07 MIL/uL (ref 3.87–5.11)
RDW: 12.9 % (ref 11.5–15.5)
WBC: 8.1 10*3/uL (ref 4.0–10.5)
nRBC: 0 % (ref 0.0–0.2)

## 2021-09-21 LAB — BASIC METABOLIC PANEL
Anion gap: 8 (ref 5–15)
BUN: 29 mg/dL — ABNORMAL HIGH (ref 8–23)
CO2: 25 mmol/L (ref 22–32)
Calcium: 10.3 mg/dL (ref 8.9–10.3)
Chloride: 104 mmol/L (ref 98–111)
Creatinine, Ser: 0.97 mg/dL (ref 0.44–1.00)
GFR, Estimated: 55 mL/min — ABNORMAL LOW (ref >60)
Glucose, Bld: 111 mg/dL — ABNORMAL HIGH (ref 70–99)
Potassium: 3.6 mmol/L (ref 3.5–5.1)
Sodium: 137 mmol/L (ref 135–145)

## 2021-09-21 LAB — URINALYSIS, ROUTINE W REFLEX MICROSCOPIC
Bilirubin Urine: NEGATIVE
Glucose, UA: NEGATIVE mg/dL
Hgb urine dipstick: NEGATIVE
Ketones, ur: NEGATIVE mg/dL
Leukocytes,Ua: NEGATIVE
Nitrite: NEGATIVE
Protein, ur: NEGATIVE mg/dL
Specific Gravity, Urine: 1.015 (ref 1.005–1.030)
pH: 6 (ref 5.0–8.0)

## 2021-09-21 NOTE — ED Triage Notes (Signed)
Urinary frequency and oliguria x 1 day , denies dysuria.

## 2021-09-21 NOTE — ED Provider Notes (Signed)
[facility] Provider Note   CSN: [id number] Arrival date & time: 09/21/21  1725     History Chief Complaint  Patient presents with   Urinary Frequency    Amber Morgan is a 87 y.o. female with medical history significant for hypertension, hiatal hernia, glaucoma, diverticulitis.  Patient presents to ED for chief complaint of urinary frequency since last night.  Patient states that in the middle the night she felt the urge that she had to use the bathroom when she used the bathroom she was unable to produce urine.  Patient states that she attempted to use the bathroom more than 10 times each time with minimal urine output.  Patient has been seen for the same issue in the past, 1 month ago.  Patient had negative work-up at this time.  Also of note, patient is hypertensive.  Patient states that she has been on hypertensive medication in the past but she says that her blood pressure came down to the point that she no longer required this medication.  Patient endorses urinary frequency.  Patient denies dysuria, fever, chills, flank pain, vaginal discharge, chest pain, shortness of breath, headaches, blurry vision.   Urinary Frequency Pertinent negatives include no chest pain, no abdominal pain, no headaches and no shortness of breath.      Past Medical History:  Diagnosis Date   Allergy    Arthritis    Blind right eye    Cancer ([place])    breast cancer    Colon polyps    Diverticulitis    Diverticulosis 01/04/2014   Glaucoma    Hernia, inguinal, right    History of chicken pox    childhood   History of hiatal hernia    HTN (hypertension)    Hyperglycemia 09/05/2013   Lipoma of arm 12/09/2014   right   Nocturia 02/08/2013   Osteopenia 12/09/2014   Personal history of colonic polyps 09/05/2013   Pre-diabetes    Preventative health care 08/14/2016   Unspecified constipation 02/06/2013   Urinary incontinence    Viral infection characterized by skin and mucous  membrane lesions 05/11/2013   Wears glasses     Patient Active Problem List   Diagnosis Date Noted   Hypercalcemia 05/27/2020   Right knee pain 01/22/2020   Varicose veins of lower extremity 08/21/2018   Nail lesion 08/19/2018   Superficial thrombophlebitis 06/12/2018   Grief 04/20/2018   Malignant neoplasm of upper-inner quadrant of right breast in female, estrogen receptor positive ([place]) 03/25/2018   Abdominal pain 12/12/2017   Right lower quadrant abdominal pain 11/27/2017   Diarrhea 02/02/2017   Urination frequency 02/02/2017   Tachycardia 02/02/2017   Preventative health care 08/14/2016   Bradycardia 11/27/2015   Hyponatremia 11/08/2015   Hypokalemia 11/08/2015   Tinnitus of right ear 08/28/2015   Medicare annual wellness visit, subsequent 07/08/2015   Osteopenia 12/09/2014   Lipoma of arm 12/09/2014   Diverticulosis 01/04/2014   Edema 09/05/2013   Personal history of colonic polyps 09/05/2013   Hyperglycemia 09/05/2013   Nocturia 02/08/2013   Constipation 02/06/2013   Paresthesia 04/26/2012   Hyperlipidemia 11/14/2011   HTN (hypertension) 09/06/2011   Glaucoma 09/06/2011    Past Surgical History:  Procedure Laterality Date   ABDOMINAL HYSTERECTOMY     APPENDECTOMY     BLADDER SURGERY  2003   bladder tact   BREAST BIOPSY Right 03/2018   BREAST LUMPECTOMY Right 04/2018   BREAST LUMPECTOMY WITH RADIOACTIVE SEED LOCALIZATION Right 04/12/2018  Procedure: BREAST LUMPECTOMY WITH RADIOACTIVE SEED LOCALIZATION;  Surgeon: [name], MD;  Location: [facility];  Service: General;  Laterality: Right;   CATARACT EXTRACTION, BILATERAL     COLONOSCOPY W/ BIOPSIES AND POLYPECTOMY     EYE SURGERY     multiple bilateral   INGUINAL HERNIA REPAIR Right 04/12/2018   Procedure: OPEN RIGHT INGUINAL HERNIA REPAIR WITH MESH;  Surgeon: [name], MD;  Location: [facility];  Service: General;  Laterality: Right;   RECTOCELE REPAIR Bilateral      OB History   No obstetric history on  file.     Family History  Problem Relation Age of Onset   Colon cancer Mother    Hypertension Mother    Heart disease Father    Meniere's disease Father    Heart disease Sister    Arthritis Son        back   Glaucoma Son    Hypertension Son    Cataracts Son    Colon cancer Sister    Alzheimer's disease Sister    Glaucoma Sister    Glaucoma Sister    Hyperlipidemia Sister    Hypertension Sister    Cancer Sister        thyroid   Diabetes Sister    Glaucoma Sister    Breast cancer Paternal Aunt    Breast cancer Paternal Aunt     Social History   Tobacco Use   Smoking status: Never   Smokeless tobacco: Never  Vaping Use   Vaping Use: Never used  Substance Use Topics   Alcohol use: No   Drug use: No    Home Medications Prior to Admission medications   Medication Sig Start Date End Date Taking? Authorizing Provider  aspirin 81 MG EC tablet Take 81 mg by mouth daily. Swallow whole.    [provider]  calcium-vitamin D (OSCAL WITH D) 500-200 MG-UNIT tablet Take 1 tablet by mouth 2 (two) times daily.    [provider]  cholecalciferol (VITAMIN D) 1000 units tablet Take 1 tablet (1,000 Units total) by mouth daily. 05/20/18   Magrinat, Virgie Dad, MD  Difluprednate (DUREZOL OP) Place 1 drop into the right eye once.    [provider]  Fiber POWD Take 2 Scoops by mouth 2 (two) times daily.    [provider]  ketorolac (ACULAR) 0.5 % ophthalmic solution Place 1 drop into the right eye daily.    [provider]  Multiple Vitamins-Calcium (ONE-A-DAY WITHIN PO) Take 1 tablet by mouth daily.    [provider]  Polyethyl Glycol-Propyl Glycol (SYSTANE OP) Place 1 drop into both eyes 3 (three) times daily. Put in both eyes    [provider]  Probiotic Product (PROBIOTIC DAILY PO) Take 1 capsule by mouth daily.     [provider]  Saccharomyces boulardii (PROBIOTIC) 250 MG CAPS Take 250 mg by mouth daily.  02/28/21   Mosie Lukes, MD  sodium chloride (MURO 128) 5 % ophthalmic ointment Place 1 application into the right eye at bedtime.    [provider]  sodium chloride (MURO 128) 5 % ophthalmic solution Place 1 drop into the right eye 2 (two) times daily. Use first then 10 minutes later uses Ketorolac    [provider]    Allergies    Amlodipine and Doxycycline  Review of Systems   Review of Systems  Constitutional:  Negative for chills and fever.  Respiratory:  Negative for shortness of breath.  Cardiovascular:  Negative for chest pain.  Gastrointestinal:  Negative for abdominal pain, diarrhea, nausea and vomiting.  Genitourinary:  Positive for decreased urine volume and frequency. Negative for dysuria, flank pain, hematuria, vaginal bleeding and vaginal discharge.  Neurological:  Negative for weakness and headaches.  All other systems reviewed and are negative.  Physical Exam Updated Vital Signs BP (!) 182/76    Pulse 74    Temp 98 F (36.7 C) (Oral)    Resp 17    Ht 5' (1.524 m)    Wt 59 kg    SpO2 100%    BMI 25.39 kg/m   Physical Exam Vitals and nursing note reviewed.  Constitutional:      General: She is not in acute distress.    Appearance: She is normal weight. She is not ill-appearing or toxic-appearing.  Cardiovascular:     Rate and Rhythm: Normal rate and regular rhythm.  Pulmonary:     Effort: Pulmonary effort is normal.     Breath sounds: Normal breath sounds. No wheezing.  Abdominal:     General: Abdomen is flat.     Palpations: Abdomen is soft.     Tenderness: There is no abdominal tenderness. There is no right CVA tenderness or left CVA tenderness.  Musculoskeletal:     Cervical back: Normal range of motion.  Skin:    General: Skin is warm and dry.     Capillary Refill: Capillary refill takes less than 2 seconds.  Neurological:     General: No focal deficit present.     Mental Status: She is alert and oriented to person, place, and  time.  Psychiatric:        Mood and Affect: Mood normal.    ED Results / Procedures / Treatments   Labs (all labs ordered are listed, but only abnormal results are displayed) Labs Reviewed  BASIC METABOLIC PANEL - Abnormal; Notable for the following components:      Result Value   Glucose, Bld 111 (*)    BUN 29 (*)    GFR, Estimated 55 (*)    All other components within normal limits  URINE CULTURE  URINALYSIS, ROUTINE W REFLEX MICROSCOPIC  CBC WITH DIFFERENTIAL/PLATELET    EKG None  Radiology No results found.  Procedures Procedures   Medications Ordered in ED Medications - No data to display  ED Course  I have reviewed the triage vital signs and the nursing notes.  Pertinent labs & imaging results that were available during my care of the patient were reviewed by me and considered in my medical decision making (see chart for details).    MDM Rules/Calculators/A&P                           87 year old female presents for urinary frequency.  Patient states that she has the urge that she needs to urinate but when she attempts to there is little to no urine output.  On examination, patient is afebrile, not hypoxic, hypertensive, with a soft nontender abdomen.  There are no signs of bladder distention.  I will work this patient up with CBC, CMP, urinalysis, bladder scan.  Patient CBC results within normal limits. Patient BMP has slightly elevated glucose, slightly elevated BUN, and slightly decreased GFR.  On chart review, these are within patient baseline. Patient urinalysis negative for nitrites or any signs of UTI.  We will culture this patient's urine.  Patient bladder scan shows minimal urine in  the bladder with highest reading of 77 mL.  Patient has urine soaked pad beneath her.  Dr. Maryan Rued performed visual inspection of vagina and did not note any signs of prolapse.  I will refer this patient to urogynecology for further outpatient work-up and  management.  Patient is notably hypertensive here.  Patient states that she has been treated for high blood pressure in the past but was taken off her medication because she states that she was able to control her blood pressure without it.  Patient denies any symptoms of hypertension currently.  I will advised patient that she needs to follow-up with her PCP for management of her hypertension.  I discussed the findings of the patient work-up with the patient.  I provided the patient return precautions including dysuria, fevers, chills, flank pain.  The patient and her son expressed understanding of these return precautions.  I have also advised the patient to follow-up with her PCP for further management of her hypertension.  They expressed understanding of these instructions as well.  Patient's case is discussed with Dr. Maryan Rued who is in agreement with current plan for discharge.  The patient is stable on discharge.     Final Clinical Impression(s) / ED Diagnoses Final diagnoses:  Urinary frequency    Rx / DC Orders ED Discharge Orders     None        Lawana Chambers 09/21/21 1909    Blanchie Dessert, MD 09/21/21 2249

## 2021-09-21 NOTE — Discharge Instructions (Signed)
Return to ED with any new or worsening symptoms such as back pain, fevers, chills, painful urination Follow-up with your PCP in the next 3 to 5 days for management of hypertension Call to schedule appoint with urogynecology center.  I provided information in this discharge packet.

## 2021-09-22 LAB — URINE CULTURE: Culture: <10000 — AB

## 2021-10-16 ENCOUNTER — Ambulatory Visit (INDEPENDENT_AMBULATORY_CARE_PROVIDER_SITE_OTHER): Payer: Medicare HMO | Admitting: Family Medicine

## 2021-10-16 ENCOUNTER — Encounter: Payer: Self-pay | Admitting: Family Medicine

## 2021-10-16 DIAGNOSIS — R739 Hyperglycemia, unspecified: Secondary | ICD-10-CM | POA: Diagnosis not present

## 2021-10-16 DIAGNOSIS — K5900 Constipation, unspecified: Secondary | ICD-10-CM | POA: Diagnosis not present

## 2021-10-16 DIAGNOSIS — M8580 Other specified disorders of bone density and structure, unspecified site: Secondary | ICD-10-CM

## 2021-10-16 DIAGNOSIS — I10 Essential (primary) hypertension: Secondary | ICD-10-CM | POA: Diagnosis not present

## 2021-10-16 DIAGNOSIS — H409 Unspecified glaucoma: Secondary | ICD-10-CM | POA: Diagnosis not present

## 2021-10-16 DIAGNOSIS — R351 Nocturia: Secondary | ICD-10-CM

## 2021-10-16 DIAGNOSIS — E785 Hyperlipidemia, unspecified: Secondary | ICD-10-CM | POA: Diagnosis not present

## 2021-10-16 LAB — CBC
HCT: 38 % (ref 36.0–46.0)
Hemoglobin: 12.6 g/dL (ref 12.0–15.0)
MCHC: 33.1 g/dL (ref 30.0–36.0)
MCV: 95 fl (ref 78.0–100.0)
Platelets: 226 10*3/uL (ref 150.0–400.0)
RBC: 4 Mil/uL (ref 3.87–5.11)
RDW: 13 % (ref 11.5–15.5)
WBC: 6.7 10*3/uL (ref 4.0–10.5)

## 2021-10-16 LAB — COMPREHENSIVE METABOLIC PANEL
ALT: 18 U/L (ref 0–35)
AST: 20 U/L (ref 0–37)
Albumin: 4.2 g/dL (ref 3.5–5.2)
Alkaline Phosphatase: 46 U/L (ref 39–117)
BUN: 30 mg/dL — ABNORMAL HIGH (ref 6–23)
CO2: 30 mEq/L (ref 19–32)
Calcium: 10 mg/dL (ref 8.4–10.5)
Chloride: 103 mEq/L (ref 96–112)
Creatinine, Ser: 1.13 mg/dL (ref 0.40–1.20)
GFR: 42.3 mL/min — ABNORMAL LOW (ref >60.00)
Glucose, Bld: 95 mg/dL (ref 70–99)
Potassium: 4.1 mEq/L (ref 3.5–5.1)
Sodium: 141 mEq/L (ref 135–145)
Total Bilirubin: 0.4 mg/dL (ref 0.2–1.2)
Total Protein: 6.5 g/dL (ref 6.0–8.3)

## 2021-10-16 LAB — LIPID PANEL
Cholesterol: 162 mg/dL (ref 0–200)
HDL: 68.3 mg/dL (ref >39.00)
LDL Cholesterol: 76 mg/dL (ref 0–99)
NonHDL: 93.48
Total CHOL/HDL Ratio: 2
Triglycerides: 86 mg/dL (ref 0.0–149.0)
VLDL: 17.2 mg/dL (ref 0.0–40.0)

## 2021-10-16 LAB — URINALYSIS, ROUTINE W REFLEX MICROSCOPIC
Bilirubin Urine: NEGATIVE
Hgb urine dipstick: NEGATIVE
Ketones, ur: NEGATIVE
Nitrite: NEGATIVE
RBC / HPF: NONE SEEN (ref 0–?)
Specific Gravity, Urine: 1.01 (ref 1.000–1.030)
Total Protein, Urine: NEGATIVE
Urine Glucose: NEGATIVE
Urobilinogen, UA: 0.2 (ref 0.0–1.0)
pH: 7.5 (ref 5.0–8.0)

## 2021-10-16 LAB — HEMOGLOBIN A1C: Hgb A1c MFr Bld: 6.3 % (ref 4.6–6.5)

## 2021-10-16 LAB — TSH: TSH: 3.69 u[IU]/mL (ref 0.35–5.50)

## 2021-10-16 NOTE — Patient Instructions (Signed)

## 2021-10-16 NOTE — Assessment & Plan Note (Signed)
Well controlled, no changes to meds. Encouraged heart healthy diet such as the DASH diet and exercise as tolerated.  °

## 2021-10-16 NOTE — Assessment & Plan Note (Signed)
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 Dr Jerline Pain and Dr Baird Cancer

## 2021-10-16 NOTE — Assessment & Plan Note (Signed)
Has an appt with urogynecology soon to discuss options. No dysuria or hematuria, no fevers or chills

## 2021-10-16 NOTE — Assessment & Plan Note (Signed)
Encouraged to get adequate exercise, calcium and vitamin d intake 

## 2021-10-16 NOTE — Progress Notes (Signed)
Subjective:    Patient ID: Amber Morgan, female    DOB: 1927/06/10, 88 y.o.   MRN: [id number]  Chief Complaint  Patient presents with   3 months follow up    HPI Patient is in today for follow upon chronic medical concerns. No recent febrile illness or hospitalizations. She is accompanied by her son and overall they note she is doing well. She is staying active and eats fairly well. Her appetite is low but she feels she eats well. Denies CP/palp/SOB/HA/congestion/fevers/GI or GU c/o. Taking meds as prescribed   Past Medical History:  Diagnosis Date   Allergy    Arthritis    Blind right eye    Cancer ([place])    breast cancer    Colon polyps    Diverticulitis    Diverticulosis 01/04/2014   Glaucoma    Hernia, inguinal, right    History of chicken pox    childhood   History of hiatal hernia    HTN (hypertension)    Hyperglycemia 09/05/2013   Lipoma of arm 12/09/2014   right   Nocturia 02/08/2013   Osteopenia 12/09/2014   Personal history of colonic polyps 09/05/2013   Pre-diabetes    Preventative health care 08/14/2016   Unspecified constipation 02/06/2013   Urinary incontinence    Viral infection characterized by skin and mucous membrane lesions 05/11/2013   Wears glasses     Past Surgical History:  Procedure Laterality Date   ABDOMINAL HYSTERECTOMY     APPENDECTOMY     BLADDER SURGERY  2003   bladder tact   BREAST BIOPSY Right 03/2018   BREAST LUMPECTOMY Right 04/2018   BREAST LUMPECTOMY WITH RADIOACTIVE SEED LOCALIZATION Right 04/12/2018   Procedure: BREAST LUMPECTOMY WITH RADIOACTIVE SEED LOCALIZATION;  Surgeon: [name], MD;  Location: [facility];  Service: General;  Laterality: Right;   CATARACT EXTRACTION, BILATERAL     COLONOSCOPY W/ BIOPSIES AND POLYPECTOMY     EYE SURGERY     multiple bilateral   INGUINAL HERNIA REPAIR Right 04/12/2018   Procedure: OPEN RIGHT INGUINAL HERNIA REPAIR WITH MESH;  Surgeon: [name], MD;  Location: [facility];  Service: General;   Laterality: Right;   RECTOCELE REPAIR Bilateral     Family History  Problem Relation Age of Onset   Colon cancer Mother    Hypertension Mother    Heart disease Father    Meniere's disease Father    Heart disease Sister    Arthritis Son        back   Glaucoma Son    Hypertension Son    Cataracts Son    Colon cancer Sister    Alzheimer's disease Sister    Glaucoma Sister    Glaucoma Sister    Hyperlipidemia Sister    Hypertension Sister    Cancer Sister        thyroid   Diabetes Sister    Glaucoma Sister    Breast cancer Paternal Aunt    Breast cancer Paternal Aunt     Social History   Socioeconomic History   Marital status: Married    Spouse name: Not on file   Number of children: 4   Years of education: Not on file   Highest education level: Not on file  Occupational History   Not on file  Tobacco Use   Smoking status: Never   Smokeless tobacco: Never  Vaping Use   Vaping Use: Never used  Substance and Sexual Activity   Alcohol use: No  Drug use: No   Sexual activity: Not Currently  Other Topics Concern   Not on file  Social History Narrative   Not on file   Social Determinants of Health   Financial Resource Strain: Not on file  Food Insecurity: Not on file  Transportation Needs: Not on file  Physical Activity: Not on file  Stress: Not on file  Social Connections: Not on file  Intimate Partner Violence: Not on file    Outpatient Medications Prior to Visit  Medication Sig Dispense Refill   aspirin 81 MG EC tablet Take 81 mg by mouth daily. Swallow whole.     calcium-vitamin D (OSCAL WITH D) 500-200 MG-UNIT tablet Take 1 tablet by mouth 2 (two) times daily.     cholecalciferol (VITAMIN D) 1000 units tablet Take 1 tablet (1,000 Units total) by mouth daily.     Difluprednate (DUREZOL OP) Place 1 drop into the right eye once.     Fiber POWD Take 2 Scoops by mouth 2 (two) times daily.     ketorolac (ACULAR) 0.5 % ophthalmic solution Place 1 drop into  the right eye daily.     Multiple Vitamins-Calcium (ONE-A-DAY WITHIN PO) Take 1 tablet by mouth daily.     Polyethyl Glycol-Propyl Glycol (SYSTANE OP) Place 1 drop into both eyes 3 (three) times daily. Put in both eyes     Probiotic Product (PROBIOTIC DAILY PO) Take 1 capsule by mouth daily.      Saccharomyces boulardii (PROBIOTIC) 250 MG CAPS Take 250 mg by mouth daily. 90 capsule 2   sodium chloride (MURO 128) 5 % ophthalmic ointment Place 1 application into the right eye at bedtime.     sodium chloride (MURO 128) 5 % ophthalmic solution Place 1 drop into the right eye 2 (two) times daily. Use first then 10 minutes later uses Ketorolac     No facility-administered medications prior to visit.    Allergies  Allergen Reactions   Amlodipine Other (See Comments)    Mental status chnages ie confusion   Doxycycline Rash    Review of Systems  Constitutional:  Negative for fever and malaise/fatigue.  HENT:  Negative for congestion.   Eyes:  Negative for blurred vision.  Respiratory:  Negative for shortness of breath.   Cardiovascular:  Negative for chest pain, palpitations and leg swelling.  Gastrointestinal:  Negative for abdominal pain, blood in stool and nausea.  Genitourinary:  Negative for dysuria and frequency.  Musculoskeletal:  Negative for falls.  Skin:  Negative for rash.  Neurological:  Negative for dizziness, loss of consciousness and headaches.  Endo/Heme/Allergies:  Negative for environmental allergies.  Psychiatric/Behavioral:  Negative for depression. The patient is not nervous/anxious.       Objective:    Physical Exam Constitutional:      General: She is not in acute distress.    Appearance: She is well-developed.  HENT:     Head: Normocephalic and atraumatic.  Eyes:     Conjunctiva/sclera: Conjunctivae normal.  Neck:     Thyroid: No thyromegaly.  Cardiovascular:     Rate and Rhythm: Normal rate and regular rhythm.     Heart sounds: Normal heart sounds. No  murmur heard. Pulmonary:     Effort: Pulmonary effort is normal. No respiratory distress.     Breath sounds: Normal breath sounds.  Abdominal:     General: Bowel sounds are normal. There is no distension.     Palpations: Abdomen is soft. There is no mass.  Tenderness: There is no abdominal tenderness.  Musculoskeletal:     Cervical back: Neck supple.  Lymphadenopathy:     Cervical: No cervical adenopathy.  Skin:    General: Skin is warm and dry.  Neurological:     Mental Status: She is alert and oriented to person, place, and time.  Psychiatric:        Behavior: Behavior normal.    BP 128/76    Pulse 82    Temp 97.8 F (36.6 C)    Resp 16    Ht 5\' 2"  (1.575 m)    Wt 133 lb 12.8 oz (60.7 kg)    SpO2 98%    BMI 24.47 kg/m  Wt Readings from Last 3 Encounters:  10/16/21 133 lb 12.8 oz (60.7 kg)  09/21/21 130 lb (59 kg)  08/19/21 127 lb 12.8 oz (58 kg)    Diabetic Foot Exam - Simple   No data filed    Lab Results  Component Value Date   WBC 8.1 09/21/2021   HGB 12.9 09/21/2021   HCT 38.5 09/21/2021   PLT 234 09/21/2021   GLUCOSE 111 (H) 09/21/2021   CHOL 157 06/17/2021   TRIG 100.0 06/17/2021   HDL 56.30 06/17/2021   LDLCALC 81 06/17/2021   ALT 17 06/17/2021   Morgan 16 06/17/2021   NA 137 09/21/2021   K 3.6 09/21/2021   CL 104 09/21/2021   CREATININE 0.97 09/21/2021   BUN 29 (H) 09/21/2021   CO2 25 09/21/2021   TSH 3.00 06/17/2021   HGBA1C 6.2 06/17/2021   MICROALBUR 0.8 08/06/2016    Lab Results  Component Value Date   TSH 3.00 06/17/2021   Lab Results  Component Value Date   WBC 8.1 09/21/2021   HGB 12.9 09/21/2021   HCT 38.5 09/21/2021   MCV 94.6 09/21/2021   PLT 234 09/21/2021   Lab Results  Component Value Date   NA 137 09/21/2021   K 3.6 09/21/2021   CO2 25 09/21/2021   GLUCOSE 111 (H) 09/21/2021   BUN 29 (H) 09/21/2021   CREATININE 0.97 09/21/2021   BILITOT 0.5 06/17/2021   ALKPHOS 43 06/17/2021   Morgan 16 06/17/2021   ALT 17 06/17/2021    PROT 6.5 06/17/2021   ALBUMIN 4.0 06/17/2021   CALCIUM 10.3 09/21/2021   ANIONGAP 8 09/21/2021   GFR 49.09 (L) 06/17/2021   Lab Results  Component Value Date   CHOL 157 06/17/2021   Lab Results  Component Value Date   HDL 56.30 06/17/2021   Lab Results  Component Value Date   LDLCALC 81 06/17/2021   Lab Results  Component Value Date   TRIG 100.0 06/17/2021   Lab Results  Component Value Date   CHOLHDL 3 06/17/2021   Lab Results  Component Value Date   HGBA1C 6.2 06/17/2021       Assessment & Plan:   Problem List Items Addressed This Visit     HTN (hypertension)    Well controlled, no changes to meds. Encouraged heart healthy diet such as the DASH diet and exercise as tolerated.       Relevant Orders   CBC   Comprehensive metabolic panel   TSH   Glaucoma    Treating right eye currently and improving slightly, not treating left due to permanent blindness. Dr Jerline Pain and Dr Baird Cancer      Hyperlipidemia    Encourage heart healthy diet such as MIND or DASH diet, increase exercise, avoid trans fats, simple carbohydrates and  processed foods, consider a krill or fish or flaxseed oil cap daily.       Relevant Orders   Lipid panel   Urinalysis   Urine Culture   Constipation    Uses Metamucil twice daily and gets about 60 ounces of fluids daily. She is doing well at the present time      Nocturia    Has an appt with urogynecology soon to discuss options. No dysuria or hematuria, no fevers or chills      Hyperglycemia    hgba1c acceptable, minimize simple carbs. Increase exercise as tolerated.       Relevant Orders   Hemoglobin A1c   Osteopenia    Encouraged to get adequate exercise, calcium and vitamin d intake       I am having Amber Morgan maintain her Probiotic Product (PROBIOTIC DAILY PO), Fiber, cholecalciferol, Polyethyl Glycol-Propyl Glycol (SYSTANE OP), Difluprednate (DUREZOL OP), Multiple Vitamins-Calcium (ONE-A-DAY WITHIN PO), aspirin,  ketorolac, sodium chloride, sodium chloride, calcium-vitamin D, and Probiotic.  No orders of the defined types were placed in this encounter.    Penni Homans, MD

## 2021-10-16 NOTE — Assessment & Plan Note (Signed)
hgba1c acceptable, minimize simple carbs. Increase exercise as tolerated.  

## 2021-10-16 NOTE — Assessment & Plan Note (Signed)
Encourage heart healthy diet such as MIND or DASH diet, increase exercise, avoid trans fats, simple carbohydrates and processed foods, consider a krill or fish or flaxseed oil cap daily.  °

## 2021-10-16 NOTE — Assessment & Plan Note (Signed)
Uses Metamucil twice daily and gets about 60 ounces of fluids daily. She is doing well at the present time

## 2021-10-17 LAB — URINE CULTURE
MICRO NUMBER:: 12862879
SPECIMEN QUALITY:: ADEQUATE

## 2021-11-05 ENCOUNTER — Encounter: Payer: Self-pay | Admitting: Obstetrics and Gynecology

## 2021-11-05 ENCOUNTER — Other Ambulatory Visit: Payer: Self-pay

## 2021-11-05 ENCOUNTER — Ambulatory Visit: Payer: Medicare HMO | Admitting: Obstetrics and Gynecology

## 2021-11-05 VITALS — BP 191/74 | HR 91

## 2021-11-05 DIAGNOSIS — R351 Nocturia: Secondary | ICD-10-CM

## 2021-11-05 DIAGNOSIS — R350 Frequency of micturition: Secondary | ICD-10-CM | POA: Diagnosis not present

## 2021-11-05 DIAGNOSIS — N3281 Overactive bladder: Secondary | ICD-10-CM

## 2021-11-05 LAB — POCT URINALYSIS DIPSTICK
Appearance: NORMAL
Bilirubin, UA: NEGATIVE
Blood, UA: NEGATIVE
Glucose, UA: NEGATIVE
Ketones, UA: NEGATIVE
Leukocytes, UA: NEGATIVE
Nitrite, UA: NEGATIVE
Protein, UA: NEGATIVE
Spec Grav, UA: 1.01 (ref 1.010–1.025)
Urobilinogen, UA: 0.2 E.U./dL
pH, UA: 5.5 (ref 5.0–8.0)

## 2021-11-05 MED ORDER — VIBEGRON 75 MG PO TABS
75.0000 mg | ORAL_TABLET | Freq: Every day | ORAL | 5 refills | Status: DC
Start: 2021-11-05 — End: 2022-06-17

## 2021-11-05 NOTE — Progress Notes (Signed)
[facility] New Patient Evaluation and Consultation  Referring Provider: Mosie Lukes, MD PCP: Mosie Lukes, MD Date of Service: 11/05/2021  SUBJECTIVE Chief Complaint: New Patient (Initial Visit) - urinary leakage  History of Present Illness: Amber Morgan is a 88 y.o. White or Caucasian female presenting for evaluation of difficulty urinating.     Urinary Symptoms: Leaks urine with going from sitting to standing, with movement to the bathroom, and with urgency. Has trouble getting to the bathroom.  Leaks 5+ time(s) per day.  Pad use: 3 liners/ mini-pads per day.   She is bothered by her UI symptoms. ? Has a sling for incontinence- "bladder tack"  Day time voids 2-3.  Nocturia: 4 times per night to void. Voiding dysfunction: she does not empty her bladder well.  does not use a catheter to empty bladder.  When urinating, she feels a weak stream and difficulty starting urine stream Drinks: more than a quart water, cranberry juice, occasional decaf coffee  Does not snore.  Stops drinking around 6 pm.   UTIs: 1 UTI's in the last year.   Denies history of blood in urine and kidney or bladder stones  Pelvic Organ Prolapse Symptoms:                  She Admits to a feeling of a bulge the vaginal area. It has been present for 3 years.  She Denies seeing a bulge.  This bulge is bothersome. Has had both an anterior and posterior repair.  Unsure if she has mesh?  Bowel Symptom: Bowel movements: 1 time(s) per day Stool consistency: soft  Straining: no.  Splinting: no.  Incomplete evacuation: no.  She Admits to accidental bowel leakage / fecal incontinence  Occurs: occasionally  Consistency with leakage: solid or soft  Bowel regimen: fiber- metamucil Last colonoscopy: Date 2017  Sexual Function Sexually active: no.    Pelvic Pain Denies pelvic pain   Past Medical History:  Past Medical History:  Diagnosis Date   Allergy    Arthritis    Blind  right eye    Cancer ([place])    breast cancer    Colon polyps    Diverticulitis    Diverticulosis 01/04/2014   Glaucoma    Hernia, inguinal, right    History of chicken pox    childhood   History of hiatal hernia    HTN (hypertension)    Hyperglycemia 09/05/2013   Lipoma of arm 12/09/2014   right   Nocturia 02/08/2013   Osteopenia 12/09/2014   Personal history of colonic polyps 09/05/2013   Pre-diabetes    Preventative health care 08/14/2016   Unspecified constipation 02/06/2013   Urinary incontinence    Viral infection characterized by skin and mucous membrane lesions 05/11/2013   Wears glasses      Past Surgical History:   Past Surgical History:  Procedure Laterality Date   ABDOMINAL HYSTERECTOMY     APPENDECTOMY     BLADDER SURGERY  2003   bladder tact   BREAST BIOPSY Right 03/2018   BREAST LUMPECTOMY Right 04/2018   BREAST LUMPECTOMY WITH RADIOACTIVE SEED LOCALIZATION Right 04/12/2018   Procedure: BREAST LUMPECTOMY WITH RADIOACTIVE SEED LOCALIZATION;  Surgeon: [name], MD;  Location: [facility];  Service: General;  Laterality: Right;   CATARACT EXTRACTION, BILATERAL     COLONOSCOPY W/ BIOPSIES AND POLYPECTOMY     EYE SURGERY     multiple bilateral   INGUINAL HERNIA REPAIR Right 04/12/2018   Procedure:  OPEN RIGHT INGUINAL HERNIA REPAIR WITH MESH;  Surgeon: [name], MD;  Location: [facility];  Service: General;  Laterality: Right;   RECTOCELE REPAIR Bilateral      Past OB/GYN History: OB History  Gravida Para Term Preterm AB Living  4         4  SAB IAB Ectopic Multiple Live Births          4    # Outcome Date GA Lbr Len/2nd Weight Sex Delivery Anes PTL Lv  4 Gravida           3 Gravida           2 Gravida           1 Gravida             Vaginal deliveries: 4 S/p hysterectomy   Medications: She has a current medication list which includes the following prescription(s): aspirin, calcium-vitamin d, cholecalciferol, difluprednate, fiber, ketorolac, multiple  vitamins-calcium, polyethyl glycol-propyl glycol, probiotic product, probiotic, sodium chloride, sodium chloride, and vibegron.   Allergies: Patient is allergic to amlodipine, amoxicillin, and doxycycline.   Social History:  Social History   Tobacco Use   Smoking status: Never   Smokeless tobacco: Never  Vaping Use   Vaping Use: Never used  Substance Use Topics   Alcohol use: No   Drug use: No     Family History:   Family History  Problem Relation Age of Onset   Colon cancer Mother    Hypertension Mother    Heart disease Father    Meniere's disease Father    Heart disease Sister    Arthritis Son        back   Glaucoma Son    Hypertension Son    Cataracts Son    Colon cancer Sister    Alzheimer's disease Sister    Glaucoma Sister    Glaucoma Sister    Hyperlipidemia Sister    Hypertension Sister    Cancer Sister        thyroid   Diabetes Sister    Glaucoma Sister    Breast cancer Paternal Aunt    Breast cancer Paternal Aunt      Review of Systems: Review of Systems  Constitutional:  Negative for fever, malaise/fatigue and weight loss.  Respiratory:  Negative for cough, shortness of breath and wheezing.   Cardiovascular:  Negative for chest pain, palpitations and leg swelling.  Gastrointestinal:  Negative for abdominal pain and blood in stool.  Genitourinary:  Negative for dysuria.  Musculoskeletal:  Negative for myalgias.  Skin:  Negative for rash.  Neurological:  Negative for dizziness and headaches.  Endo/Heme/Allergies:  Does not bruise/bleed easily.  Psychiatric/Behavioral:  Negative for depression. The patient is not nervous/anxious.     OBJECTIVE Physical Exam: Vitals:   11/05/21 1424  BP: (!) 191/74  Pulse: 91    Physical Exam Constitutional:      General: She is not in acute distress. Pulmonary:     Effort: Pulmonary effort is normal.  Abdominal:     General: There is no distension.     Palpations: Abdomen is soft.     Tenderness:  There is no abdominal tenderness. There is no rebound.  Musculoskeletal:        General: No swelling. Normal range of motion.  Skin:    General: Skin is warm and dry.     Findings: No rash.  Neurological:     Mental Status: She is alert and oriented  to person, place, and time.  Psychiatric:        Mood and Affect: Mood normal.        Behavior: Behavior normal.     GU / Detailed Urogynecologic Evaluation:  Pelvic Exam: Normal external female genitalia; Bartholin's and Skene's glands normal in appearance; urethral meatus normal in appearance, no urethral masses or discharge.   CST: negativ  s/p hysterectomy: Speculum exam reveals normal vaginal mucosa with  atrophy and normal vaginal cuff.  Adnexa no mass, fullness, tenderness.    Pelvic floor strength I/V  Pelvic floor musculature: Right levator non-tender, Right obturator non-tender, Left levator non-tender, Left obturator non-tender  POP-Q:   POP-Q  -3                                            Aa   -3                                           Ba  -7                                              C   1                                            Gh  4                                            Pb  7                                            tvl   -3                                            Ap  -3                                            Bp                                                 D     Rectal Exam:  Normal external rectum  Post-Void Residual (PVR) by Bladder Scan: In order to evaluate bladder emptying, we discussed obtaining a postvoid residual and she agreed to this procedure.  Procedure: The ultrasound unit was placed on the patient's abdomen in the suprapubic region after the patient had voided. A PVR of 1 ml was obtained by  bladder scan.  Laboratory Results: POC urine: negative   ASSESSMENT AND PLAN Ms. Atkin is a 88 y.o. with:  1. Urinary frequency   2. Nocturia   3. Overactive  bladder    -We discussed the symptoms of overactive bladder (OAB), which include urinary urgency, urinary frequency, nocturia, with or without urge incontinence.  While we do not know the exact etiology of OAB, several treatment options exist. We discussed management including behavioral therapy (decreasing bladder irritants, urge suppression strategies, timed voids, bladder retraining), physical therapy, medication; for refractory cases posterior tibial nerve stimulation, sacral neuromodulation, and intravesical botulinum toxin injection.  - She has elevated BP today so is not a candidate for myrbetriq, and age and glaucoma preclude her from having anticholinergic medications. -Prescribed Gemtesa 75mg  daily. Samples provided  Follow up 6 weeks to assess progress  Jaquita Folds, MD

## 2021-11-17 ENCOUNTER — Telehealth: Payer: Self-pay | Admitting: Obstetrics and Gynecology

## 2021-11-18 NOTE — Telephone Encounter (Signed)
Pharmacy was called to verify Vibegron Amber Morgan) 75mg  was received. The Pharmacy saud the last prescription they received was Nov 2022. I spoke to the pharmacist and verbally call in the prescription for Vibegron 75 1 tab daily qty 30 with 5 refills.  Pt was contacted and notified

## 2021-12-02 DIAGNOSIS — H3540 Unspecified peripheral retinal degeneration: Secondary | ICD-10-CM | POA: Diagnosis not present

## 2021-12-02 DIAGNOSIS — H35361 Drusen (degenerative) of macula, right eye: Secondary | ICD-10-CM | POA: Diagnosis not present

## 2021-12-02 DIAGNOSIS — H43811 Vitreous degeneration, right eye: Secondary | ICD-10-CM | POA: Diagnosis not present

## 2021-12-02 DIAGNOSIS — H35371 Puckering of macula, right eye: Secondary | ICD-10-CM | POA: Diagnosis not present

## 2021-12-05 ENCOUNTER — Telehealth: Payer: Self-pay | Admitting: Family Medicine

## 2021-12-05 NOTE — Telephone Encounter (Signed)
Left message for patient to call back and schedule Medicare Annual Wellness Visit (AWV) in office.  ? ?If not able to come in office, please offer to do virtually or by telephone.  Left office number and my jabber [phone]. ? ?Last AWV:04/21/2019 ? ?Please schedule at anytime with Nurse Health Advisor. ?  ?

## 2021-12-09 ENCOUNTER — Other Ambulatory Visit: Payer: Self-pay

## 2021-12-09 ENCOUNTER — Ambulatory Visit (INDEPENDENT_AMBULATORY_CARE_PROVIDER_SITE_OTHER): Payer: Medicare HMO

## 2021-12-09 DIAGNOSIS — Z0000 Encounter for general adult medical examination without abnormal findings: Secondary | ICD-10-CM

## 2021-12-09 NOTE — Progress Notes (Addendum)
Virtual Visit via Telephone Note  I connected with  Amber Morgan on 12/09/21 at  1:00 PM EST by telephone and verified that I am speaking with the correct person using two identifiers.  Medicare Annual Wellness visit completed telephonically due to Covid-19 pandemic.   Persons participating in this call: This Health Coach and this patient.   Location: Patient: Home Provider: Office   I discussed the limitations, risks, security and privacy concerns of performing an evaluation and management service by telephone and the availability of in person appointments. The patient expressed understanding and agreed to proceed.  Unable to perform video visit due to video visit attempted and failed and/or patient does not have video capability.   Some vital signs may be absent or patient reported.   Willette Brace, LPN   Subjective:   Amber Morgan is a 88 y.o. female who presents for Medicare Annual (Subsequent) preventive examination.  Review of Systems     Cardiac Risk Factors include: advanced age (>8mn, >>57women);dyslipidemia;hypertension     Objective:    There were no vitals filed for this visit. There is no height or weight on file to calculate BMI.  Advanced Directives 12/09/2021 09/21/2021 08/20/2021 08/18/2021 07/17/2020 04/21/2019 04/04/2018  Does Patient Have a Medical Advance Directive? Yes Yes Yes No Yes Yes Yes  Type of Advance Directive HHazardLiving will [place] Does patient want to make changes to medical advance directive? - - - - - No - Patient declined No - Patient declined  Copy of [place] Chart? No - copy requested - - - - No - copy requested No - copy requested    Current Medications (verified) Outpatient Encounter Medications as of 12/09/2021  Medication Sig   aspirin 81 MG EC tablet Take 81 mg by mouth daily. Swallow  whole.   calcium-vitamin D (OSCAL WITH D) 500-200 MG-UNIT tablet Take 1 tablet by mouth 2 (two) times daily.   cholecalciferol (VITAMIN D) 1000 units tablet Take 1 tablet (1,000 Units total) by mouth daily.   Fiber POWD Take 2 Scoops by mouth 2 (two) times daily.   Multiple Vitamins-Calcium (ONE-A-DAY WITHIN PO) Take 1 tablet by mouth daily.   Polyethyl Glycol-Propyl Glycol (SYSTANE OP) Place 1 drop into both eyes 3 (three) times daily. Put in both eyes   sodium chloride (MURO 128) 5 % ophthalmic ointment Place 1 application into the right eye at bedtime.   sodium chloride (MURO 128) 5 % ophthalmic solution Place 1 drop into the right eye 2 (two) times daily. Use first then 10 minutes later uses Ketorolac   ketorolac (ACULAR) 0.5 % ophthalmic solution Place 1 drop into the right eye daily.   MODERNA COVID-19 BIVAL BOOSTER 50 MCG/0.5ML injection    Probiotic Product (PROBIOTIC DAILY PO) Take 1 capsule by mouth daily.    Saccharomyces boulardii (PROBIOTIC) 250 MG CAPS Take 250 mg by mouth daily.   Vibegron 75 MG TABS Take 75 mg by mouth daily.   [DISCONTINUED] Difluprednate (DUREZOL OP) Place 1 drop into the right eye once.   No facility-administered encounter medications on file as of 12/09/2021.    Allergies (verified) Amlodipine, Amoxicillin, and Doxycycline   History: Past Medical History:  Diagnosis Date   Allergy    Arthritis    Blind right eye    Cancer (HZeba    breast cancer  Colon polyps    Diverticulitis    Diverticulosis 01/04/2014   Glaucoma    Hernia, inguinal, right    History of chicken pox    childhood   History of hiatal hernia    HTN (hypertension)    Hyperglycemia 09/05/2013   Lipoma of arm 12/09/2014   right   Nocturia 02/08/2013   Osteopenia 12/09/2014   Personal history of colonic polyps 09/05/2013   Pre-diabetes    Preventative health care 08/14/2016   Unspecified constipation 02/06/2013   Urinary incontinence    Viral infection characterized by skin and  mucous membrane lesions 05/11/2013   Wears glasses    Past Surgical History:  Procedure Laterality Date   ABDOMINAL HYSTERECTOMY     APPENDECTOMY     BLADDER SURGERY  2003   bladder tact   BREAST BIOPSY Right 03/2018   BREAST LUMPECTOMY Right 04/2018   BREAST LUMPECTOMY WITH RADIOACTIVE SEED LOCALIZATION Right 04/12/2018   Procedure: BREAST LUMPECTOMY WITH RADIOACTIVE SEED LOCALIZATION;  Surgeon: [name], MD;  Location: [place] [facility];  Service: General;  Laterality: Right;   CATARACT EXTRACTION, BILATERAL     COLONOSCOPY W/ BIOPSIES AND POLYPECTOMY     EYE SURGERY     multiple bilateral   INGUINAL HERNIA REPAIR Right 04/12/2018   Procedure: OPEN RIGHT INGUINAL HERNIA REPAIR WITH MESH;  Surgeon: [name], MD;  Location: [facility];  Service: General;  Laterality: Right;   RECTOCELE REPAIR Bilateral    Family History  Problem Relation Age of Onset   Colon cancer Mother    Hypertension Mother    Heart disease Father    Meniere's disease Father    Heart disease Sister    Arthritis Son        back   Glaucoma Son    Hypertension Son    Cataracts Son    Colon cancer Sister    Alzheimer's disease Sister    Glaucoma Sister    Glaucoma Sister    Hyperlipidemia Sister    Hypertension Sister    Cancer Sister        thyroid   Diabetes Sister    Glaucoma Sister    Breast cancer Paternal Aunt    Breast cancer Paternal Aunt    Social History   Socioeconomic History   Marital status: Married    Spouse name: Not on file   Number of children: 4   Years of education: Not on file   Highest education level: Not on file  Occupational History   Not on file  Tobacco Use   Smoking status: Never   Smokeless tobacco: Never  Vaping Use   Vaping Use: Never used  Substance and Sexual Activity   Alcohol use: No   Drug use: No   Sexual activity: Not Currently  Other Topics Concern   Not on file  Social History Narrative   Not on file   Social Determinants of Health   Financial Resource  Strain: Low Risk    Difficulty of Paying Living Expenses: Not hard at all  Food Insecurity: No Food Insecurity   Worried About Charity fundraiser in the Last Year: Never true   Arboriculturist in the Last Year: Never true  Transportation Needs: No Transportation Needs   Lack of Transportation (Medical): No   Lack of Transportation (Non-Medical): No  Physical Activity: Sufficiently Active   Days of Exercise per Week: 7 days   Minutes of Exercise per Session: 60 min  Stress: No  Stress Concern Present   Feeling of Stress : Not at all  Social Connections: Moderately Isolated   Frequency of Communication with Friends and Family: More than three times a week   Frequency of Social Gatherings with Friends and Family: More than three times a week   Attends Religious Services: More than 4 times per year   Active Member of [employer] or Organizations: No   Attends Archivist Meetings: Never   Marital Status: Widowed    Tobacco Counseling Counseling given: Not Answered   Clinical Intake:  Pre-visit preparation completed: Yes  Pain : No/denies pain     BMI - recorded: 24.47 Nutritional Status: BMI of 19-24  Normal Nutritional Risks: None Diabetes: No  How often do you need to have someone help you when you read instructions, pamphlets, or other written materials from your doctor or pharmacy?: 1 - Never  Diabetic?No  Interpreter Needed?: No  Information entered by :: Charlott Rakes, LPN   Activities of Daily Living In your present state of health, do you have any difficulty performing the following activities: 12/09/2021  Hearing? N  Vision? N  Difficulty concentrating or making decisions? N  Walking or climbing stairs? N  Dressing or bathing? N  Doing errands, shopping? N  Preparing Food and eating ? N  Using the Toilet? N  In the past six months, have you accidently leaked urine? Y  Comment wears a brief  Do you have problems with loss of bowel control? N   Managing your Medications? N  Managing your Finances? N  Housekeeping or managing your Housekeeping? N  Some recent data might be hidden    Patient Care Team: Mosie Lukes, MD as PCP - General (Family Medicine) Jerline Pain Mingo Amber, DO as Consulting Physician (Osteopathic Medicine) Rolan Lipa, MD as Consulting Physician (Gastroenterology) Stanford Breed Denice Bors, MD as Consulting Physician (Cardiology) [name], MD as Consulting Physician (General Surgery) Magrinat, Virgie Dad, MD (Inactive) as Consulting Physician (Oncology) Eppie Gibson, MD as Attending Physician (Radiation Oncology)  Indicate any recent Medical Services you may have received from other than [facility] providers in the past year (date may be approximate).     Assessment:   This is a routine wellness examination for [place].  Hearing/Vision screen Hearing Screening - Comments:: Pt denies any hearing issues Vision Screening - Comments:: Pt follows up with dr Baird Cancer for annul eye exams   Dietary issues and exercise activities discussed: Current Exercise Habits: Home exercise routine, Type of exercise: walking, Time (Minutes): 60, Frequency (Times/Week): 7, Weekly Exercise (Minutes/Week): 420   Goals Addressed             This Visit's Progress    Patient Stated       None at this time        Depression Screen PHQ 2/9 Scores 12/09/2021 12/09/2021 11/28/2020 09/21/2019 04/21/2019 02/15/2018 06/04/2017  PHQ - 2 Score 0 0 0 0 0 0 0  PHQ- 9 Score - - 2 - - - -    Fall Risk Fall Risk  12/09/2021 11/28/2020 09/21/2019 04/21/2019 02/15/2018  Falls in the past year? 0 0 0 0 No  Number falls in past yr: 0 0 - - -  Injury with Fall? 0 0 - - -  Risk for fall due to : Impaired vision - - - -  Follow up Falls prevention discussed - - - -    FALL RISK PREVENTION PERTAINING TO THE HOME:  Any stairs in or around the home?  No  If so, are there any without handrails? No  Home free of loose throw rugs in walkways, pet beds,  electrical cords, etc? Yes  Adequate lighting in your home to reduce risk of falls? Yes   ASSISTIVE DEVICES UTILIZED TO PREVENT FALLS:  Life alert? Yes  Use of a cane, walker or w/c? No  Grab bars in the bathroom? Yes  Shower chair or bench in shower? Yes  Elevated toilet seat or a handicapped toilet? Yes   TIMED UP AND GO:  Was the test performed? No .   Cognitive Function: MMSE - Mini Mental State Exam 02/22/2017  Orientation to time 5  Orientation to Place 5  Registration 3  Attention/ Calculation 4  Recall 1  Language- name 2 objects 2  Language- repeat 1  Language- follow 3 step command 3  Language- read & follow direction 1  Write a sentence 0  Copy design 1  Total score 26     6CIT Screen 12/09/2021  What Year? 0 points  What month? 0 points  What time? 0 points  Count back from 20 0 points  Months in reverse 0 points  Repeat phrase 0 points  Total Score 0    Immunizations Immunization History  Administered Date(s) Administered   Fluad Quad(high Dose 65+) 06/17/2021   Influenza Split 07/13/2012   Influenza, High Dose Seasonal PF 06/22/2016, 06/04/2017, 06/07/2018, 07/05/2019   Influenza,inj,Quad PF,6+ Mos 06/22/2013, 06/01/2014, 07/08/2015   Moderna Covid-19 Vaccine Bivalent Booster 11yr & up 07/14/2021   Moderna Sars-Covid-2 Vaccination 10/04/2019, 11/01/2019, 08/23/2020, 02/20/2021   Pneumococcal Conjugate-13 06/01/2014   Pneumococcal Polysaccharide-23 12/07/2009   Tdap 05/21/2013   Zoster Recombinat (Shingrix) 07/07/2018, 09/12/2018   Zoster, Live 07/14/2011    TDAP status: Up to date  Flu Vaccine status: Up to date  Pneumococcal vaccine status: Up to date  Covid-19 vaccine status: Completed vaccines  Qualifies for Shingles Vaccine? Yes   Zostavax completed Yes   Shingrix Completed?: Yes  Screening Tests Health Maintenance  Topic Date Due   TETANUS/TDAP  05/22/2023   Pneumonia Vaccine 88 Years old  Completed   INFLUENZA VACCINE   Completed   DEXA SCAN  Completed   COVID-19 Vaccine  Completed   Zoster Vaccines- Shingrix  Completed   HPV VACCINES  Aged Out   MAMMOGRAM  Discontinued    Health Maintenance  There are no preventive care reminders to display for this patient.  Colorectal cancer screening: No longer required.   Mammogram status: Completed 07/02/21. Repeat every year  Bone Density status: Completed 12/19/20 . Results reflect: Bone density results: OSTEOPENIA. Repeat every 2 years.   Additional Screening:   Vision Screening: Recommended annual ophthalmology exams for early detection of glaucoma and other disorders of the eye. Is the patient up to date with their annual eye exam?  Yes  Who is the provider or what is the name of the office in which the patient attends annual eye exams? Dr SBaird Cancer If pt is not established with a provider, would they like to be referred to a provider to establish care? No .   Dental Screening: Recommended annual dental exams for proper oral hygiene  Community Resource Referral / Chronic Care Management: CRR required this visit?  No   CCM required this visit?  No      Plan:     I have personally reviewed and noted the following in the patients chart:   Medical and social history Use of alcohol, tobacco or illicit  drugs  Current medications and supplements including opioid prescriptions.  Functional ability and status Nutritional status Physical activity Advanced directives List of other physicians Hospitalizations, surgeries, and ER visits in previous 12 months Vitals Screenings to include cognitive, depression, and falls Referrals and appointments  In addition, I have reviewed and discussed with patient certain preventive protocols, quality metrics, and best practice recommendations. A written personalized care plan for preventive services as well as general preventive health recommendations were provided to patient.     Willette Brace,  LPN   10/11/3843   Nurse Notes: None

## 2021-12-09 NOTE — Patient Instructions (Signed)
Amber Morgan , Thank you for taking time to come for your Medicare Wellness Visit. I appreciate your ongoing commitment to your health goals. Please review the following plan we discussed and let me know if I can assist you in the future.   Screening recommendations/referrals: Colonoscopy: no longer required Mammogram: Done 07/02/21 repeat every year  Bone Density: Done 12/19/20 repeat every 2 years  Recommended yearly ophthalmology/optometry visit for glaucoma screening and checkup Recommended yearly dental visit for hygiene and checkup  Vaccinations: Influenza vaccine: Done 06/17/21 repeat every year  Pneumococcal vaccine: Up to date Tdap vaccine: Completed 05/21/13 repeat every 10 years  Shingles vaccine: Completed 10/3, 09/06/18   Covid-19:Completed 10/04/19, 11/01/19, 08/23/20, 02/20/21, 07/14/21  Advanced directives: Please bring a copy of your health care power of attorney and living will to the office at your convenience.  Conditions/risks identified: None at this time   Next appointment: Follow up in one year for your annual wellness visit     Preventive Care 65 Years and Older, Female Preventive care refers to lifestyle choices and visits with your health care provider that can promote health and wellness. What does preventive care include? A yearly physical exam. This is also called an annual well check. Dental exams once or twice a year. Routine eye exams. Ask your health care provider how often you should have your eyes checked. Personal lifestyle choices, including: Daily care of your teeth and gums. Regular physical activity. Eating a healthy diet. Avoiding tobacco and drug use. Limiting alcohol use. Practicing safe sex. Taking low-dose aspirin every day. Taking vitamin and mineral supplements as recommended by your health care provider. What happens during an annual well check? The services and screenings done by your health care provider during your annual well check  will depend on your age, overall health, lifestyle risk factors, and family history of disease. Counseling  Your health care provider may ask you questions about your: Alcohol use. Tobacco use. Drug use. Emotional well-being. Home and relationship well-being. Sexual activity. Eating habits. History of falls. Memory and ability to understand (cognition). Work and work Statistician. Reproductive health. Screening  You may have the following tests or measurements: Height, weight, and BMI. Blood pressure. Lipid and cholesterol levels. These may be checked every 5 years, or more frequently if you are over 26 years old. Skin check. Lung cancer screening. You may have this screening every year starting at age 78 if you have a 30-pack-year history of smoking and currently smoke or have quit within the past 15 years. Fecal occult blood test (FOBT) of the stool. You may have this test every year starting at age 85. Flexible sigmoidoscopy or colonoscopy. You may have a sigmoidoscopy every 5 years or a colonoscopy every 10 years starting at age 22. Hepatitis C blood test. Hepatitis B blood test. Sexually transmitted disease (STD) testing. Diabetes screening. This is done by checking your blood sugar (glucose) after you have not eaten for a while (fasting). You may have this done every 1-3 years. Bone density scan. This is done to screen for osteoporosis. You may have this done starting at age 37. Mammogram. This may be done every 1-2 years. Talk to your health care provider about how often you should have regular mammograms. Talk with your health care provider about your test results, treatment options, and if necessary, the need for more tests. Vaccines  Your health care provider may recommend certain vaccines, such as: Influenza vaccine. This is recommended every year. Tetanus, diphtheria, and acellular  pertussis (Tdap, Td) vaccine. You may need a Td booster every 10 years. Zoster vaccine. You  may need this after age 37. Pneumococcal 13-valent conjugate (PCV13) vaccine. One dose is recommended after age 65. Pneumococcal polysaccharide (PPSV23) vaccine. One dose is recommended after age 54. Talk to your health care provider about which screenings and vaccines you need and how often you need them. This information is not intended to replace advice given to you by your health care provider. Make sure you discuss any questions you have with your health care provider. Document Released: 10/18/2015 Document Revised: 06/10/2016 Document Reviewed: 07/23/2015 Elsevier Interactive Patient Education  2017 [place] Prevention in the Home Falls can cause injuries. They can happen to people of all ages. There are many things you can do to make your home safe and to help prevent falls. What can I do on the outside of my home? Regularly fix the edges of walkways and driveways and fix any cracks. Remove anything that might make you trip as you walk through a door, such as a raised step or threshold. Trim any bushes or trees on the path to your home. Use bright outdoor lighting. Clear any walking paths of anything that might make someone trip, such as rocks or tools. Regularly check to see if handrails are loose or broken. Make sure that both sides of any steps have handrails. Any raised decks and porches should have guardrails on the edges. Have any leaves, snow, or ice cleared regularly. Use sand or salt on walking paths during winter. Clean up any spills in your garage right away. This includes oil or grease spills. What can I do in the bathroom? Use night lights. Install grab bars by the toilet and in the tub and shower. Do not use towel bars as grab bars. Use non-skid mats or decals in the tub or shower. If you need to sit down in the shower, use a plastic, non-slip stool. Keep the floor dry. Clean up any water that spills on the floor as soon as it happens. Remove soap buildup  in the tub or shower regularly. Attach bath mats securely with double-sided non-slip rug tape. Do not have throw rugs and other things on the floor that can make you trip. What can I do in the bedroom? Use night lights. Make sure that you have a light by your bed that is easy to reach. Do not use any sheets or blankets that are too big for your bed. They should not hang down onto the floor. Have a firm chair that has side arms. You can use this for support while you get dressed. Do not have throw rugs and other things on the floor that can make you trip. What can I do in the kitchen? Clean up any spills right away. Avoid walking on wet floors. Keep items that you use a lot in easy-to-reach places. If you need to reach something above you, use a strong step stool that has a grab bar. Keep electrical cords out of the way. Do not use floor polish or wax that makes floors slippery. If you must use wax, use non-skid floor wax. Do not have throw rugs and other things on the floor that can make you trip. What can I do with my stairs? Do not leave any items on the stairs. Make sure that there are handrails on both sides of the stairs and use them. Fix handrails that are broken or loose. Make sure that handrails  are as long as the stairways. Check any carpeting to make sure that it is firmly attached to the stairs. Fix any carpet that is loose or worn. Avoid having throw rugs at the top or bottom of the stairs. If you do have throw rugs, attach them to the floor with carpet tape. Make sure that you have a light switch at the top of the stairs and the bottom of the stairs. If you do not have them, ask someone to add them for you. What else can I do to help prevent falls? Wear shoes that: Do not have high heels. Have rubber bottoms. Are comfortable and fit you well. Are closed at the toe. Do not wear sandals. If you use a stepladder: Make sure that it is fully opened. Do not climb a closed  stepladder. Make sure that both sides of the stepladder are locked into place. Ask someone to hold it for you, if possible. Clearly mark and make sure that you can see: Any grab bars or handrails. First and last steps. Where the edge of each step is. Use tools that help you move around (mobility aids) if they are needed. These include: Canes. Walkers. Scooters. Crutches. Turn on the lights when you go into a dark area. Replace any light bulbs as soon as they burn out. Set up your furniture so you have a clear path. Avoid moving your furniture around. If any of your floors are uneven, fix them. If there are any pets around you, be aware of where they are. Review your medicines with your doctor. Some medicines can make you feel dizzy. This can increase your chance of falling. Ask your doctor what other things that you can do to help prevent falls. This information is not intended to replace advice given to you by your health care provider. Make sure you discuss any questions you have with your health care provider. Document Released: 07/18/2009 Document Revised: 02/27/2016 Document Reviewed: 10/26/2014 Elsevier Interactive Patient Education  2017 [employer].

## 2021-12-19 ENCOUNTER — Ambulatory Visit: Payer: Medicare HMO | Admitting: Obstetrics and Gynecology

## 2021-12-19 ENCOUNTER — Encounter: Payer: Self-pay | Admitting: Obstetrics and Gynecology

## 2021-12-19 ENCOUNTER — Other Ambulatory Visit: Payer: Self-pay

## 2021-12-19 VITALS — BP 142/75 | HR 76 | Ht 59.0 in | Wt 133.0 lb

## 2021-12-19 DIAGNOSIS — N3281 Overactive bladder: Secondary | ICD-10-CM | POA: Diagnosis not present

## 2021-12-19 DIAGNOSIS — R351 Nocturia: Secondary | ICD-10-CM | POA: Diagnosis not present

## 2021-12-19 NOTE — Progress Notes (Signed)
[facility] ?Return Visit ? ?SUBJECTIVE  ?History of Present Illness: ?Amber Morgan is a 88 y.o. female seen in follow-up for OAB/ Nocturia. Plan at last visit was to start Gemtesa '75mg'$  daily.  ? ?She has seen good improvement in her symptoms. She is able to make it to the bathroom most times without leaking. She is waking up 2x per night (vs 4 previously).  ? ?Past Medical History: ?Patient  has a past medical history of Allergy, Arthritis, Blind right eye, Cancer ([place]), Colon polyps, Diverticulitis, Diverticulosis (01/04/2014), Glaucoma, Hernia, inguinal, right, History of chicken pox, History of hiatal hernia, HTN (hypertension), Hyperglycemia (09/05/2013), Lipoma of arm (12/09/2014), Nocturia (02/08/2013), Osteopenia (12/09/2014), Personal history of colonic polyps (09/05/2013), Pre-diabetes, Preventative health care (08/14/2016), Unspecified constipation (02/06/2013), Urinary incontinence, Viral infection characterized by skin and mucous membrane lesions (05/11/2013), and Wears glasses.  ? ?Past Surgical History: ?She  has a past surgical history that includes Eye surgery; Bladder surgery (2003); Abdominal hysterectomy; Rectocele repair (Bilateral); Cataract extraction, bilateral; Appendectomy; Colonoscopy w/ biopsies and polypectomy; Breast lumpectomy with radioactive seed localization (Right, 04/12/2018); Inguinal hernia repair (Right, 04/12/2018); Breast lumpectomy (Right, 04/2018); and Breast biopsy (Right, 03/2018).  ? ?Medications: ?She has a current medication list which includes the following prescription(s): aspirin, calcium-vitamin d, cholecalciferol, fiber, ketorolac, moderna covid-19 bival booster, multiple vitamins-calcium, polyethyl glycol-propyl glycol, probiotic product, probiotic, sodium chloride, sodium chloride, and vibegron.  ? ?Allergies: ?Patient is allergic to amlodipine, amoxicillin, and doxycycline.  ? ?Social History: ?Patient  reports that she has never smoked. She has never used  smokeless tobacco. She reports that she does not drink alcohol and does not use drugs.  ?  ?  ?OBJECTIVE  ?  ? ?Physical Exam: ?Vitals:  ? 12/19/21 1404  ?BP: (!) 142/75  ?Pulse: 76  ?Weight: 133 lb (60.3 kg)  ?Height: '4\' 11"'$  (1.499 m)  ? ?Gen: No apparent distress, A&O x 3. ? ?Detailed Urogynecologic Evaluation:  ?Deferred.  ? ?ASSESSMENT AND PLAN  ?  ?Amber Morgan is a 88 y.o. with:  ?1. Overactive bladder   ?2. Nocturia   ? ?- Continue with Gemtesa '75mg'$  daily ? ?Follow up 6 months or sooner if needed ? ?Jaquita Folds, MD ? ? ?Time spent: I spent 15 minutes dedicated to the care of this patient on the date of this encounter to include pre-visit review of records, face-to-face time with the patient and post visit documentation. ? ?

## 2022-01-27 DIAGNOSIS — H35371 Puckering of macula, right eye: Secondary | ICD-10-CM | POA: Diagnosis not present

## 2022-01-27 DIAGNOSIS — H35361 Drusen (degenerative) of macula, right eye: Secondary | ICD-10-CM | POA: Diagnosis not present

## 2022-01-27 DIAGNOSIS — H3540 Unspecified peripheral retinal degeneration: Secondary | ICD-10-CM | POA: Diagnosis not present

## 2022-01-27 DIAGNOSIS — H44522 Atrophy of globe, left eye: Secondary | ICD-10-CM | POA: Diagnosis not present

## 2022-02-10 DIAGNOSIS — H401133 Primary open-angle glaucoma, bilateral, severe stage: Secondary | ICD-10-CM | POA: Diagnosis not present

## 2022-02-10 DIAGNOSIS — H35351 Cystoid macular degeneration, right eye: Secondary | ICD-10-CM | POA: Diagnosis not present

## 2022-02-10 DIAGNOSIS — H43811 Vitreous degeneration, right eye: Secondary | ICD-10-CM | POA: Diagnosis not present

## 2022-02-10 DIAGNOSIS — H44522 Atrophy of globe, left eye: Secondary | ICD-10-CM | POA: Diagnosis not present

## 2022-02-16 ENCOUNTER — Ambulatory Visit: Payer: Medicare HMO | Admitting: Family Medicine

## 2022-02-16 ENCOUNTER — Ambulatory Visit (INDEPENDENT_AMBULATORY_CARE_PROVIDER_SITE_OTHER): Payer: Medicare HMO | Admitting: Family Medicine

## 2022-02-16 ENCOUNTER — Encounter: Payer: Self-pay | Admitting: Family Medicine

## 2022-02-16 VITALS — BP 139/79 | HR 87 | Ht 59.0 in | Wt 130.4 lb

## 2022-02-16 DIAGNOSIS — R350 Frequency of micturition: Secondary | ICD-10-CM

## 2022-02-16 DIAGNOSIS — H409 Unspecified glaucoma: Secondary | ICD-10-CM

## 2022-02-16 DIAGNOSIS — E785 Hyperlipidemia, unspecified: Secondary | ICD-10-CM | POA: Diagnosis not present

## 2022-02-16 DIAGNOSIS — I10 Essential (primary) hypertension: Secondary | ICD-10-CM

## 2022-02-16 DIAGNOSIS — R739 Hyperglycemia, unspecified: Secondary | ICD-10-CM

## 2022-02-16 DIAGNOSIS — M8580 Other specified disorders of bone density and structure, unspecified site: Secondary | ICD-10-CM

## 2022-02-16 LAB — POC URINALSYSI DIPSTICK (AUTOMATED)
Bilirubin, UA: NEGATIVE
Blood, UA: NEGATIVE
Glucose, UA: NEGATIVE
Ketones, UA: NEGATIVE
Leukocytes, UA: NEGATIVE
Nitrite, UA: NEGATIVE
Protein, UA: NEGATIVE
Spec Grav, UA: 1.01 (ref 1.010–1.025)
Urobilinogen, UA: 0.2 E.U./dL
pH, UA: 6 (ref 5.0–8.0)

## 2022-02-16 LAB — VITAMIN D 25 HYDROXY (VIT D DEFICIENCY, FRACTURES): VITD: 70.4 ng/mL (ref 30.00–100.00)

## 2022-02-16 LAB — HEMOGLOBIN A1C: Hgb A1c MFr Bld: 6.2 % (ref 4.6–6.5)

## 2022-02-16 NOTE — Progress Notes (Signed)
? ?Established Patient Office Visit ? ?Subjective   ?Patient ID: Amber Morgan, female    DOB: 11-05-1926  Age: 88 y.o. MRN: [id number] ? ?CC: routine f/u  ? ? ?HPI ?Patient reports she is doing well overall. She has no new complaints or concerns.  ? ?HYPERTENSION: ?- Medications: none, lifestyle management ?- Compliance: n/a ?- Checking BP at home: yes, 120-130/50s ?- Denies any SOB, recurrent headaches, CP, vision changes, LE edema, dizziness, palpitations, or medication side effects. ?- Diet: low carb/sugar/sodium ?- Exercise: walking daily  ? ? ?HYPERLIPIDEMIA ?- medications: none, lifestyle management ?- compliance: n/a ?- medication SEs: n/a ?The ASCVD Risk score (Arnett DK, et al., 2019) failed to calculate for the following reasons: ?  The 2019 ASCVD risk score is only valid for ages 82 to 56 ? ? ?OSTEOPENIA ?- taking Calcium and vitamin D ?- No recent falls or fractures  ? ?HYPERGLYCEMIA ?- well controlled with last A1c 6.3% ?- diet controlled, daily exercise  ?- denies any polydipsia, polyuria, polyphagia ? ?GLAUCOMA: ?- Sees opthalmology every 2-4 months. No new concerns.  ? ?URINARY FREQUENCY: ?- waking up 3-4x/night to urinate ?- she has seen a urologist and they started Vibegron 75 mg daily (started taking around February); she thinks it may have decreased frequency somewhat, but not significantly.  ?- She is scheduled to see them in the next several months ?- She would like urine checked today - no other signs or symptoms (no dysuria, fevers, chills, pain, etc).  ? ? ? ? ?ROS ?All review of systems negative except what is listed in the HPI ? ?  ?Objective:  ?  ? ?BP 139/79   Pulse 87   Ht '4\' 11"'$  (1.499 m)   Wt 130 lb 6.4 oz (59.1 kg)   BMI 26.34 kg/m?  ? ? ?Physical Exam ?Vitals reviewed.  ?Constitutional:   ?   General: She is not in acute distress. ?   Appearance: Normal appearance. She is not ill-appearing.  ?HENT:  ?   Head: Normocephalic and atraumatic.  ?Cardiovascular:  ?   Rate and Rhythm:  Normal rate and regular rhythm.  ?Pulmonary:  ?   Effort: Pulmonary effort is normal.  ?   Breath sounds: Normal breath sounds.  ?Abdominal:  ?   General: Abdomen is flat. Bowel sounds are normal.  ?   Palpations: Abdomen is soft. There is no mass.  ?   Tenderness: There is no right CVA tenderness, left CVA tenderness or guarding.  ?Skin: ?   General: Skin is warm and dry.  ?Neurological:  ?   General: No focal deficit present.  ?   Mental Status: She is alert and oriented to person, place, and time. Mental status is at baseline.  ?Psychiatric:     ?   Mood and Affect: Mood normal.     ?   Behavior: Behavior normal.     ?   Thought Content: Thought content normal.     ?   Judgment: Judgment normal.  ? ? ? ?Results for orders placed or performed in visit on 02/16/22  ?POCT Urinalysis Dipstick (Automated)  ?Result Value Ref Range  ? Color, UA yellow   ? Clarity, UA clear   ? Glucose, UA Negative Negative  ? Bilirubin, UA neg   ? Ketones, UA neg   ? Spec Grav, UA 1.010 1.010 - 1.025  ? Blood, UA neg   ? pH, UA 6.0 5.0 - 8.0  ? Protein, UA Negative Negative  ?  Urobilinogen, UA 0.2 0.2 or 1.0 E.U./dL  ? Nitrite, UA neg   ? Leukocytes, UA Negative Negative  ? ? ? ? ?The ASCVD Risk score (Arnett DK, et al., 2019) failed to calculate for the following reasons: ?  The 2019 ASCVD risk score is only valid for ages 59 to 34 ? ?  ?Assessment & Plan:  ? ?1. Primary hypertension ?Stable with lifestyle modifications. Continue healthy diet and regular physical activity.  ?- Comprehensive metabolic panel ? ?2. Osteopenia, unspecified location ?Stable. No new concerns. Rechecking vitamin D today. Continue calcium and vitamin D supplementation and weight bearing activities.  ?- VITAMIN D 25 Hydroxy (Vit-D Deficiency, Fractures) ? ?3. Glaucoma, unspecified glaucoma type, unspecified laterality ?No new changes. Continue following with ophthalmology.  ? ?4. Hyperglycemia ?Previously stable in prediabetic range. Patent has been working  hard with low-carb/sugar diet and exercise. Rechecking labs today.  ?- Hemoglobin A1c ?- Comprehensive metabolic panel ? ?5. Hyperlipidemia, unspecified hyperlipidemia type ?Previously stable on lifestyle management. Normal 3-4 months ago, defer repeat lipids today. Continue heart healthy diet and physical activity.  ?- Comprehensive metabolic panel ? ?6. Urination frequency ?UA negative today. Continue following with urology for overactive bladder. Continue vibegron as she has been getting some improvement.  ?- Comprehensive metabolic panel ?- POCT Urinalysis Dipstick (Automated) ? ? ? ?Return in about 3 months (around 05/19/2022) for routine f/u PCP.  ? ? ?Terrilyn Saver, NP ? ?

## 2022-02-16 NOTE — Patient Instructions (Signed)
All labs were normal last check in January. We can repeat your A1c, kidney function, and Vitamin D levels today.  ?Urine was normal. Continue following with urology.  ?No new changes. We will update you with results when they come in. For now, continue your current medications, healthy diet and regular exerciser  ?

## 2022-02-17 LAB — COMPREHENSIVE METABOLIC PANEL
ALT: 17 U/L (ref 0–35)
AST: 17 U/L (ref 0–37)
Albumin: 4.2 g/dL (ref 3.5–5.2)
Alkaline Phosphatase: 41 U/L (ref 39–117)
BUN: 26 mg/dL — ABNORMAL HIGH (ref 6–23)
CO2: 27 mEq/L (ref 19–32)
Calcium: 10.8 mg/dL — ABNORMAL HIGH (ref 8.4–10.5)
Chloride: 103 mEq/L (ref 96–112)
Creatinine, Ser: 1.1 mg/dL (ref 0.40–1.20)
GFR: 43.58 mL/min — ABNORMAL LOW (ref >60.00)
Glucose, Bld: 99 mg/dL (ref 70–99)
Potassium: 4.7 mEq/L (ref 3.5–5.1)
Sodium: 140 mEq/L (ref 135–145)
Total Bilirubin: 0.4 mg/dL (ref 0.2–1.2)
Total Protein: 6.4 g/dL (ref 6.0–8.3)

## 2022-02-17 NOTE — Addendum Note (Signed)
Addended by: Caleen Jobs B on: 02/17/2022 12:41 PM ? ? Modules accepted: Orders ? ?

## 2022-03-06 ENCOUNTER — Other Ambulatory Visit (INDEPENDENT_AMBULATORY_CARE_PROVIDER_SITE_OTHER): Payer: Medicare HMO

## 2022-03-06 LAB — COMPREHENSIVE METABOLIC PANEL
ALT: 17 U/L (ref 0–35)
AST: 18 U/L (ref 0–37)
Albumin: 4 g/dL (ref 3.5–5.2)
Alkaline Phosphatase: 43 U/L (ref 39–117)
BUN: 20 mg/dL (ref 6–23)
CO2: 29 mEq/L (ref 19–32)
Calcium: 9.9 mg/dL (ref 8.4–10.5)
Chloride: 102 mEq/L (ref 96–112)
Creatinine, Ser: 1.03 mg/dL (ref 0.40–1.20)
GFR: 47.15 mL/min — ABNORMAL LOW (ref >60.00)
Glucose, Bld: 99 mg/dL (ref 70–99)
Potassium: 4.4 mEq/L (ref 3.5–5.1)
Sodium: 137 mEq/L (ref 135–145)
Total Bilirubin: 0.6 mg/dL (ref 0.2–1.2)
Total Protein: 6.4 g/dL (ref 6.0–8.3)

## 2022-03-09 ENCOUNTER — Encounter: Payer: Self-pay | Admitting: *Deleted

## 2022-04-28 DIAGNOSIS — H35371 Puckering of macula, right eye: Secondary | ICD-10-CM | POA: Diagnosis not present

## 2022-04-28 DIAGNOSIS — H35361 Drusen (degenerative) of macula, right eye: Secondary | ICD-10-CM | POA: Diagnosis not present

## 2022-04-28 DIAGNOSIS — H3581 Retinal edema: Secondary | ICD-10-CM | POA: Diagnosis not present

## 2022-04-28 DIAGNOSIS — H43811 Vitreous degeneration, right eye: Secondary | ICD-10-CM | POA: Diagnosis not present

## 2022-05-25 ENCOUNTER — Ambulatory Visit (INDEPENDENT_AMBULATORY_CARE_PROVIDER_SITE_OTHER): Payer: Medicare HMO | Admitting: Family Medicine

## 2022-05-25 ENCOUNTER — Encounter: Payer: Self-pay | Admitting: Family Medicine

## 2022-05-25 VITALS — BP 118/82 | HR 77 | Temp 97.9°F | Resp 18 | Ht 59.0 in | Wt 134.1 lb

## 2022-05-25 DIAGNOSIS — R350 Frequency of micturition: Secondary | ICD-10-CM | POA: Diagnosis not present

## 2022-05-25 DIAGNOSIS — E785 Hyperlipidemia, unspecified: Secondary | ICD-10-CM | POA: Diagnosis not present

## 2022-05-25 DIAGNOSIS — I10 Essential (primary) hypertension: Secondary | ICD-10-CM | POA: Diagnosis not present

## 2022-05-25 DIAGNOSIS — N3281 Overactive bladder: Secondary | ICD-10-CM | POA: Insufficient documentation

## 2022-05-25 DIAGNOSIS — R197 Diarrhea, unspecified: Secondary | ICD-10-CM | POA: Diagnosis not present

## 2022-05-25 DIAGNOSIS — R000 Tachycardia, unspecified: Secondary | ICD-10-CM

## 2022-05-25 DIAGNOSIS — R739 Hyperglycemia, unspecified: Secondary | ICD-10-CM

## 2022-05-25 NOTE — Patient Instructions (Signed)

## 2022-05-25 NOTE — Assessment & Plan Note (Signed)
Check UA and culture 

## 2022-05-25 NOTE — Assessment & Plan Note (Signed)
Has added Metamucil and is improved.

## 2022-05-25 NOTE — Assessment & Plan Note (Signed)
Was placed on Vibegron by urogynecology and her frequency is some improved.

## 2022-05-25 NOTE — Assessment & Plan Note (Signed)
Repeat blood pressure much improved and consistent with the numbers they are seeing at home. No changes today

## 2022-05-25 NOTE — Assessment & Plan Note (Signed)
hgba1c acceptable, minimize simple carbs. Increase exercise as tolerated.  

## 2022-05-25 NOTE — Assessment & Plan Note (Signed)
Encourage heart healthy diet such as MIND or DASH diet, increase exercise, avoid trans fats, simple carbohydrates and processed foods, consider a krill or fish or flaxseed oil cap daily.  °

## 2022-05-25 NOTE — Assessment & Plan Note (Signed)
RRR today 

## 2022-05-25 NOTE — Progress Notes (Signed)
Subjective:    Patient ID: Amber Morgan, female    DOB: [birth year], 88 y.o.   MRN: [id number]  Chief Complaint  Patient presents with   Follow-up    HPI Patient is in today for follow up on chronic medical concerns. She is accompanied by her son. They deny any recent febrile illness or acute hospitalizations. She was seen by urogynecology and placed on her on a new medicine which has improved her frequency. No dysuria or hematuria. She is living at [facility]. Denies CP/palp/SOB/HA/congestion/fevers/GI c/o. Taking meds as prescribed   Past Medical History:  Diagnosis Date   Allergy    Arthritis    Blind right eye    Cancer ([place])    breast cancer    Colon polyps    Diverticulitis    Diverticulosis 01/04/2014   Glaucoma    Hernia, inguinal, right    History of chicken pox    childhood   History of hiatal hernia    HTN (hypertension)    Hyperglycemia 09/05/2013   Lipoma of arm 12/09/2014   right   Nocturia 02/08/2013   Osteopenia 12/09/2014   Personal history of colonic polyps 09/05/2013   Pre-diabetes    Preventative health care 08/14/2016   Unspecified constipation 02/06/2013   Urinary incontinence    Viral infection characterized by skin and mucous membrane lesions 05/11/2013   Wears glasses     Past Surgical History:  Procedure Laterality Date   ABDOMINAL HYSTERECTOMY     APPENDECTOMY     BLADDER SURGERY  2003   bladder tact   BREAST BIOPSY Right 03/2018   BREAST LUMPECTOMY Right 04/2018   BREAST LUMPECTOMY WITH RADIOACTIVE SEED LOCALIZATION Right 04/12/2018   Procedure: BREAST LUMPECTOMY WITH RADIOACTIVE SEED LOCALIZATION;  Surgeon: [name], MD;  Location: [facility];  Service: General;  Laterality: Right;   CATARACT EXTRACTION, BILATERAL     COLONOSCOPY W/ BIOPSIES AND POLYPECTOMY     EYE SURGERY     multiple bilateral   INGUINAL HERNIA REPAIR Right 04/12/2018   Procedure: OPEN RIGHT INGUINAL HERNIA REPAIR WITH MESH;  Surgeon: [name], MD;  Location:  [facility] OR;  Service: General;  Laterality: Right;   RECTOCELE REPAIR Bilateral     Family History  Problem Relation Age of Onset   Colon cancer Mother    Hypertension Mother    Heart disease Father    Meniere's disease Father    Heart disease Sister    Arthritis Son        back   Glaucoma Son    Hypertension Son    Cataracts Son    Colon cancer Sister    Alzheimer's disease Sister    Glaucoma Sister    Glaucoma Sister    Hyperlipidemia Sister    Hypertension Sister    Cancer Sister        thyroid   Diabetes Sister    Glaucoma Sister    Breast cancer Paternal Aunt    Breast cancer Paternal Aunt     Social History   Socioeconomic History   Marital status: Married    Spouse name: Not on file   Number of children: 4   Years of education: Not on file   Highest education level: Not on file  Occupational History   Not on file  Tobacco Use   Smoking status: Never   Smokeless tobacco: Never  Vaping Use   Vaping Use: Never used  Substance and Sexual Activity   Alcohol  use: No   Drug use: No   Sexual activity: Not Currently  Other Topics Concern   Not on file  Social History Narrative   Not on file   Social Determinants of Health   Financial Resource Strain: Low Risk  (12/09/2021)   Overall Financial Resource Strain (CARDIA)    Difficulty of Paying Living Expenses: Not hard at all  Food Insecurity: No Food Insecurity (12/09/2021)   Hunger Vital Sign    Worried About Running Out of Food in the Last Year: Never true    Ran Out of Food in the Last Year: Never true  Transportation Needs: No Transportation Needs (12/09/2021)   PRAPARE - Hydrologist (Medical): No    Lack of Transportation (Non-Medical): No  Physical Activity: Sufficiently Active (12/09/2021)   Exercise Vital Sign    Days of Exercise per Week: 7 days    Minutes of Exercise per Session: 60 min  Stress: No Stress Concern Present (12/09/2021)   [place]    Feeling of Stress : Not at all  Social Connections: Moderately Isolated (12/09/2021)   Social Connection and Isolation Panel [NHANES]    Frequency of Communication with Friends and Family: More than three times a week    Frequency of Social Gatherings with Friends and Family: More than three times a week    Attends Religious Services: More than 4 times per year    Active Member of [employer] or Organizations: No    Attends Archivist Meetings: Never    Marital Status: Widowed  Intimate Partner Violence: Not At Risk (12/09/2021)   Humiliation, Afraid, Rape, and Kick questionnaire    Fear of Current or Ex-Partner: No    Emotionally Abused: No    Physically Abused: No    Sexually Abused: No    Outpatient Medications Prior to Visit  Medication Sig Dispense Refill   aspirin 81 MG EC tablet Take 81 mg by mouth daily. Swallow whole.     calcium-vitamin D (OSCAL WITH D) 500-200 MG-UNIT tablet Take 1 tablet by mouth 2 (two) times daily.     cholecalciferol (VITAMIN D) 1000 units tablet Take 1 tablet (1,000 Units total) by mouth daily.     Fiber POWD Take 2 Scoops by mouth 2 (two) times daily.     ketorolac (ACULAR) 0.5 % ophthalmic solution Place 2 drops into the right eye daily.     Multiple Vitamins-Calcium (ONE-A-DAY WITHIN PO) Take 1 tablet by mouth daily.     Polyethyl Glycol-Propyl Glycol (SYSTANE OP) Place 1 drop into both eyes 3 (three) times daily. Put in both eyes     sodium chloride (MURO 128) 5 % ophthalmic ointment Place 1 application into the right eye at bedtime.     Vibegron 75 MG TABS Take 75 mg by mouth daily. 30 tablet 5   sodium chloride (MURO 128) 5 % ophthalmic solution Place 1 drop into the right eye 2 (two) times daily. Use first then 10 minutes later uses Ketorolac     MODERNA COVID-19 BIVAL BOOSTER 50 MCG/0.5ML injection      No facility-administered medications prior to visit.    Allergies  Allergen Reactions   Amlodipine  Other (See Comments)    Mental status chnages ie confusion   Amoxicillin     hallucinations   Doxycycline Rash    Review of Systems  Constitutional:  Positive for malaise/fatigue. Negative for fever.  HENT:  Negative for congestion.   Eyes:  Negative for blurred vision.  Respiratory:  Negative for shortness of breath.   Cardiovascular:  Negative for chest pain, palpitations and leg swelling.  Gastrointestinal:  Negative for abdominal pain, blood in stool and nausea.  Genitourinary:  Positive for frequency. Negative for dysuria.  Musculoskeletal:  Negative for falls.  Skin:  Negative for rash.  Neurological:  Negative for dizziness, loss of consciousness and headaches.  Endo/Heme/Allergies:  Negative for environmental allergies.  Psychiatric/Behavioral:  Negative for depression. The patient is not nervous/anxious.        Objective:    Physical Exam Constitutional:      General: She is not in acute distress.    Appearance: She is well-developed.  HENT:     Head: Normocephalic and atraumatic.  Eyes:     Conjunctiva/sclera: Conjunctivae normal.  Neck:     Thyroid: No thyromegaly.  Cardiovascular:     Rate and Rhythm: Normal rate and regular rhythm.     Heart sounds: Normal heart sounds. No murmur heard. Pulmonary:     Effort: Pulmonary effort is normal. No respiratory distress.     Breath sounds: Normal breath sounds.  Abdominal:     General: Bowel sounds are normal. There is no distension.     Palpations: Abdomen is soft. There is no mass.     Tenderness: There is no abdominal tenderness.  Musculoskeletal:     Cervical back: Neck supple.  Lymphadenopathy:     Cervical: No cervical adenopathy.  Skin:    General: Skin is warm and dry.  Neurological:     Mental Status: She is alert and oriented to person, place, and time.  Psychiatric:        Behavior: Behavior normal.     BP 118/82   Pulse 77   Temp 97.9 F (36.6 C) (Oral)   Resp 18   Ht '4\' 11"'$  (1.499 m)    Wt 134 lb 2 oz (60.8 kg)   SpO2 100%   BMI 27.09 kg/m  Wt Readings from Last 3 Encounters:  05/25/22 134 lb 2 oz (60.8 kg)  02/16/22 130 lb 6.4 oz (59.1 kg)  12/19/21 133 lb (60.3 kg)    Diabetic Foot Exam - Simple   No data filed    Lab Results  Component Value Date   WBC 6.7 10/16/2021   HGB 12.6 10/16/2021   HCT 38.0 10/16/2021   PLT 226.0 10/16/2021   GLUCOSE 99 03/06/2022   CHOL 162 10/16/2021   TRIG 86.0 10/16/2021   HDL 68.30 10/16/2021   LDLCALC 76 10/16/2021   ALT 17 03/06/2022   Morgan 18 03/06/2022   NA 137 03/06/2022   K 4.4 03/06/2022   CL 102 03/06/2022   CREATININE 1.03 03/06/2022   BUN 20 03/06/2022   CO2 29 03/06/2022   TSH 3.69 10/16/2021   HGBA1C 6.2 02/16/2022   MICROALBUR 0.8 08/06/2016    Lab Results  Component Value Date   TSH 3.69 10/16/2021   Lab Results  Component Value Date   WBC 6.7 10/16/2021   HGB 12.6 10/16/2021   HCT 38.0 10/16/2021   MCV 95.0 10/16/2021   PLT 226.0 10/16/2021   Lab Results  Component Value Date   NA 137 03/06/2022   K 4.4 03/06/2022   CO2 29 03/06/2022   GLUCOSE 99 03/06/2022   BUN 20 03/06/2022   CREATININE 1.03 03/06/2022   BILITOT 0.6 03/06/2022   ALKPHOS 43 03/06/2022   Morgan 18 03/06/2022  ALT 17 03/06/2022   PROT 6.4 03/06/2022   ALBUMIN 4.0 03/06/2022   CALCIUM 9.9 03/06/2022   ANIONGAP 8 09/21/2021   GFR 47.15 (L) 03/06/2022   Lab Results  Component Value Date   CHOL 162 10/16/2021   Lab Results  Component Value Date   HDL 68.30 10/16/2021   Lab Results  Component Value Date   LDLCALC 76 10/16/2021   Lab Results  Component Value Date   TRIG 86.0 10/16/2021   Lab Results  Component Value Date   CHOLHDL 2 10/16/2021   Lab Results  Component Value Date   HGBA1C 6.2 02/16/2022       Assessment & Plan:   Problem List Items Addressed This Visit     HTN (hypertension)    Repeat blood pressure much improved and consistent with the numbers they are seeing at home. No changes  today      Hyperlipidemia    Encourage heart healthy diet such as MIND or DASH diet, increase exercise, avoid trans fats, simple carbohydrates and processed foods, consider a krill or fish or flaxseed oil cap daily.       Hyperglycemia    hgba1c acceptable, minimize simple carbs. Increase exercise as tolerated.       Diarrhea    Has added Metamucil and is improved.      Urinary frequency - Primary    Check UA and culture      Relevant Orders   Urinalysis   Urine Culture   Tachycardia    RRR today      Overactive bladder    Was placed on Vibegron by urogynecology and her frequency is some improved.        I have discontinued Darnesha M. Meals's Moderna COVID-19 Bival Booster. I am also having her maintain her Fiber, cholecalciferol, Polyethyl Glycol-Propyl Glycol (SYSTANE OP), Multiple Vitamins-Calcium (ONE-A-DAY WITHIN PO), aspirin EC, ketorolac, sodium chloride, calcium-vitamin D, and Vibegron.  No orders of the defined types were placed in this encounter.    Penni Homans, MD

## 2022-05-26 DIAGNOSIS — R350 Frequency of micturition: Secondary | ICD-10-CM | POA: Diagnosis not present

## 2022-05-26 LAB — URINALYSIS
Bilirubin Urine: NEGATIVE
Hgb urine dipstick: NEGATIVE
Ketones, ur: NEGATIVE
Leukocytes,Ua: NEGATIVE
Nitrite: NEGATIVE
Specific Gravity, Urine: 1.01 (ref 1.000–1.030)
Total Protein, Urine: NEGATIVE
Urine Glucose: NEGATIVE
Urobilinogen, UA: 0.2 (ref 0.0–1.0)
pH: 6 (ref 5.0–8.0)

## 2022-05-27 LAB — URINE CULTURE
MICRO NUMBER:: 13813947
SPECIMEN QUALITY:: ADEQUATE

## 2022-06-02 ENCOUNTER — Other Ambulatory Visit: Payer: Self-pay | Admitting: Family Medicine

## 2022-06-02 DIAGNOSIS — Z1231 Encounter for screening mammogram for malignant neoplasm of breast: Secondary | ICD-10-CM

## 2022-06-17 ENCOUNTER — Ambulatory Visit (INDEPENDENT_AMBULATORY_CARE_PROVIDER_SITE_OTHER): Payer: Medicare HMO | Admitting: Obstetrics and Gynecology

## 2022-06-17 ENCOUNTER — Other Ambulatory Visit: Payer: Self-pay | Admitting: Obstetrics and Gynecology

## 2022-06-17 ENCOUNTER — Encounter: Payer: Self-pay | Admitting: Obstetrics and Gynecology

## 2022-06-17 DIAGNOSIS — R351 Nocturia: Secondary | ICD-10-CM

## 2022-06-17 DIAGNOSIS — N3281 Overactive bladder: Secondary | ICD-10-CM

## 2022-06-17 MED ORDER — VIBEGRON 75 MG PO TABS: 75.0000 mg | ORAL_TABLET | Freq: Every day | ORAL | 11 refills | Status: DC

## 2022-06-17 NOTE — Progress Notes (Signed)
[facility] Return Visit  SUBJECTIVE  History of Present Illness: Amber Morgan is a 87 y.o. female seen in follow-up for OAB/ Nocturia. She is on Gemtesa '75mg'$  daily.   She has slightly better control of her leakage but still leaks on the way to the bathroom. She is drinking some decaf coffee and is drinking water.  Still wakes 2 times per night to urinate. Overall medication works well for her.   Past Medical History: Patient  has a past medical history of Allergy, Arthritis, Blind right eye, Cancer ([place]), Colon polyps, Diverticulitis, Diverticulosis (01/04/2014), Glaucoma, Hernia, inguinal, right, History of chicken pox, History of hiatal hernia, HTN (hypertension), Hyperglycemia (09/05/2013), Lipoma of arm (12/09/2014), Nocturia (02/08/2013), Osteopenia (12/09/2014), Personal history of colonic polyps (09/05/2013), Pre-diabetes, Preventative health care (08/14/2016), Unspecified constipation (02/06/2013), Urinary incontinence, Viral infection characterized by skin and mucous membrane lesions (05/11/2013), and Wears glasses.   Past Surgical History: She  has a past surgical history that includes Eye surgery; Bladder surgery (2003); Abdominal hysterectomy; Rectocele repair (Bilateral); Cataract extraction, bilateral; Appendectomy; Colonoscopy w/ biopsies and polypectomy; Breast lumpectomy with radioactive seed localization (Right, 04/12/2018); Inguinal hernia repair (Right, 04/12/2018); Breast lumpectomy (Right, 04/2018); and Breast biopsy (Right, 03/2018).   Medications: She has a current medication list which includes the following prescription(s): aspirin ec, calcium-vitamin d, cholecalciferol, fiber, ketorolac, multiple vitamins-calcium, polyethyl glycol-propyl glycol, sodium chloride, and vibegron.   Allergies: Patient is allergic to amlodipine, amoxicillin, and doxycycline.   Social History: Patient  reports that she has never smoked. She has never used smokeless tobacco. She reports that  she does not drink alcohol and does not use drugs.      OBJECTIVE     Physical Exam: Vitals:   06/17/22 1118  BP: (!) 145/75  Pulse: 79    Gen: No apparent distress, A&O x 3.  Detailed Urogynecologic Evaluation:  Deferred.   ASSESSMENT AND PLAN    Amber Morgan is a 88 y.o. with:  1. Nocturia   2. Overactive bladder     - Continue with Gemtesa '75mg'$  daily, refills provided.  - Discussed option of pelvic PT but she declines today.   Follow up 1 year or sooner if needed  Jaquita Folds, MD   Time spent: I spent 15 minutes dedicated to the care of this patient on the date of this encounter to include pre-visit review of records, face-to-face time with the patient and post visit documentation.

## 2022-07-03 ENCOUNTER — Ambulatory Visit: Payer: Medicare HMO

## 2022-07-27 ENCOUNTER — Ambulatory Visit
Admission: RE | Admit: 2022-07-27 | Discharge: 2022-07-27 | Disposition: A | Discharge status: Home / Self Care | Payer: Medicare HMO | Source: Ambulatory Visit | Attending: Family Medicine | Admitting: Family Medicine

## 2022-07-27 DIAGNOSIS — Z1231 Encounter for screening mammogram for malignant neoplasm of breast: Secondary | ICD-10-CM

## 2022-07-31 DIAGNOSIS — H35371 Puckering of macula, right eye: Secondary | ICD-10-CM | POA: Diagnosis not present

## 2022-07-31 DIAGNOSIS — H3581 Retinal edema: Secondary | ICD-10-CM | POA: Diagnosis not present

## 2022-07-31 DIAGNOSIS — H35361 Drusen (degenerative) of macula, right eye: Secondary | ICD-10-CM | POA: Diagnosis not present

## 2022-07-31 DIAGNOSIS — H43811 Vitreous degeneration, right eye: Secondary | ICD-10-CM | POA: Diagnosis not present

## 2022-08-18 DIAGNOSIS — H401133 Primary open-angle glaucoma, bilateral, severe stage: Secondary | ICD-10-CM | POA: Diagnosis not present

## 2022-08-18 DIAGNOSIS — H44522 Atrophy of globe, left eye: Secondary | ICD-10-CM | POA: Diagnosis not present

## 2022-08-18 DIAGNOSIS — H35351 Cystoid macular degeneration, right eye: Secondary | ICD-10-CM | POA: Diagnosis not present

## 2022-11-26 ENCOUNTER — Ambulatory Visit: Payer: Medicare HMO | Admitting: Family Medicine

## 2022-12-04 ENCOUNTER — Telehealth: Payer: Self-pay | Admitting: Family Medicine

## 2022-12-04 DIAGNOSIS — H35361 Drusen (degenerative) of macula, right eye: Secondary | ICD-10-CM | POA: Diagnosis not present

## 2022-12-04 DIAGNOSIS — H35371 Puckering of macula, right eye: Secondary | ICD-10-CM | POA: Diagnosis not present

## 2022-12-04 DIAGNOSIS — H43811 Vitreous degeneration, right eye: Secondary | ICD-10-CM | POA: Diagnosis not present

## 2022-12-04 NOTE — Telephone Encounter (Signed)
Contacted [name] to schedule their annual wellness visit. Appointment made for 12/14/2022.  Sherol Dade; Care Guide [facility] Direct Dial: [phone]

## 2022-12-14 ENCOUNTER — Ambulatory Visit (INDEPENDENT_AMBULATORY_CARE_PROVIDER_SITE_OTHER): Payer: Medicare HMO | Admitting: *Deleted

## 2022-12-14 VITALS — BP 117/66 | HR 73 | Ht 59.0 in | Wt 134.8 lb

## 2022-12-14 DIAGNOSIS — Z0000 Encounter for general adult medical examination without abnormal findings: Secondary | ICD-10-CM | POA: Diagnosis not present

## 2022-12-14 NOTE — Patient Instructions (Signed)
Amber Morgan , Thank you for taking time to come for your Medicare Wellness Visit. I appreciate your ongoing commitment to your health goals. Please review the following plan we discussed and let me know if I can assist you in the future.     This is a list of the screening recommended for you and due dates:  Health Maintenance  Topic Date Due   COVID-19 Vaccine (6 - 2023-24 season) 06/05/2022   DTaP/Tdap/Td vaccine (2 - Td or Tdap) 05/22/2023   Medicare Annual Wellness Visit  12/14/2023   Pneumonia Vaccine  Completed   Flu Shot  Completed   DEXA scan (bone density measurement)  Completed   Zoster (Shingles) Vaccine  Completed   HPV Vaccine  Aged Out   Mammogram  Discontinued     Next appointment: Follow up in one year for your annual wellness visit.   Preventive Care 65 Years and Older, Female Preventive care refers to lifestyle choices and visits with your health care provider that can promote health and wellness. What does preventive care include? A yearly physical exam. This is also called an annual well check. Dental exams once or twice a year. Routine eye exams. Ask your health care provider how often you should have your eyes checked. Personal lifestyle choices, including: Daily care of your teeth and gums. Regular physical activity. Eating a healthy diet. Avoiding tobacco and drug use. Limiting alcohol use. Practicing safe sex. Taking low-dose aspirin every day. Taking vitamin and mineral supplements as recommended by your health care provider. What happens during an annual well check? The services and screenings done by your health care provider during your annual well check will depend on your age, overall health, lifestyle risk factors, and family history of disease. Counseling  Your health care provider may ask you questions about your: Alcohol use. Tobacco use. Drug use. Emotional well-being. Home and relationship well-being. Sexual activity. Eating  habits. History of falls. Memory and ability to understand (cognition). Work and work Statistician. Reproductive health. Screening  You may have the following tests or measurements: Height, weight, and BMI. Blood pressure. Lipid and cholesterol levels. These may be checked every 5 years, or more frequently if you are over 23 years old. Skin check. Lung cancer screening. You may have this screening every year starting at age 25 if you have a 30-pack-year history of smoking and currently smoke or have quit within the past 15 years. Fecal occult blood test (FOBT) of the stool. You may have this test every year starting at age 21. Flexible sigmoidoscopy or colonoscopy. You may have a sigmoidoscopy every 5 years or a colonoscopy every 10 years starting at age 69. Hepatitis C blood test. Hepatitis B blood test. Sexually transmitted disease (STD) testing. Diabetes screening. This is done by checking your blood sugar (glucose) after you have not eaten for a while (fasting). You may have this done every 1-3 years. Bone density scan. This is done to screen for osteoporosis. You may have this done starting at age 89. Mammogram. This may be done every 1-2 years. Talk to your health care provider about how often you should have regular mammograms. Talk with your health care provider about your test results, treatment options, and if necessary, the need for more tests. Vaccines  Your health care provider may recommend certain vaccines, such as: Influenza vaccine. This is recommended every year. Tetanus, diphtheria, and acellular pertussis (Tdap, Td) vaccine. You may need a Td booster every 10 years. Zoster vaccine. You may  need this after age 33. Pneumococcal 13-valent conjugate (PCV13) vaccine. One dose is recommended after age 40. Pneumococcal polysaccharide (PPSV23) vaccine. One dose is recommended after age 47. Talk to your health care provider about which screenings and vaccines you need and how  often you need them. This information is not intended to replace advice given to you by your health care provider. Make sure you discuss any questions you have with your health care provider. Document Released: 10/18/2015 Document Revised: 06/10/2016 Document Reviewed: 07/23/2015 Elsevier Interactive Patient Education  2017 [place] Prevention in the Home Falls can cause injuries. They can happen to people of all ages. There are many things you can do to make your home safe and to help prevent falls. What can I do on the outside of my home? Regularly fix the edges of walkways and driveways and fix any cracks. Remove anything that might make you trip as you walk through a door, such as a raised step or threshold. Trim any bushes or trees on the path to your home. Use bright outdoor lighting. Clear any walking paths of anything that might make someone trip, such as rocks or tools. Regularly check to see if handrails are loose or broken. Make sure that both sides of any steps have handrails. Any raised decks and porches should have guardrails on the edges. Have any leaves, snow, or ice cleared regularly. Use sand or salt on walking paths during winter. Clean up any spills in your garage right away. This includes oil or grease spills. What can I do in the bathroom? Use night lights. Install grab bars by the toilet and in the tub and shower. Do not use towel bars as grab bars. Use non-skid mats or decals in the tub or shower. If you need to sit down in the shower, use a plastic, non-slip stool. Keep the floor dry. Clean up any water that spills on the floor as soon as it happens. Remove soap buildup in the tub or shower regularly. Attach bath mats securely with double-sided non-slip rug tape. Do not have throw rugs and other things on the floor that can make you trip. What can I do in the bedroom? Use night lights. Make sure that you have a light by your bed that is easy to  reach. Do not use any sheets or blankets that are too big for your bed. They should not hang down onto the floor. Have a firm chair that has side arms. You can use this for support while you get dressed. Do not have throw rugs and other things on the floor that can make you trip. What can I do in the kitchen? Clean up any spills right away. Avoid walking on wet floors. Keep items that you use a lot in easy-to-reach places. If you need to reach something above you, use a strong step stool that has a grab bar. Keep electrical cords out of the way. Do not use floor polish or wax that makes floors slippery. If you must use wax, use non-skid floor wax. Do not have throw rugs and other things on the floor that can make you trip. What can I do with my stairs? Do not leave any items on the stairs. Make sure that there are handrails on both sides of the stairs and use them. Fix handrails that are broken or loose. Make sure that handrails are as long as the stairways. Check any carpeting to make sure that it is firmly attached  to the stairs. Fix any carpet that is loose or worn. Avoid having throw rugs at the top or bottom of the stairs. If you do have throw rugs, attach them to the floor with carpet tape. Make sure that you have a light switch at the top of the stairs and the bottom of the stairs. If you do not have them, ask someone to add them for you. What else can I do to help prevent falls? Wear shoes that: Do not have high heels. Have rubber bottoms. Are comfortable and fit you well. Are closed at the toe. Do not wear sandals. If you use a stepladder: Make sure that it is fully opened. Do not climb a closed stepladder. Make sure that both sides of the stepladder are locked into place. Ask someone to hold it for you, if possible. Clearly mark and make sure that you can see: Any grab bars or handrails. First and last steps. Where the edge of each step is. Use tools that help you move  around (mobility aids) if they are needed. These include: Canes. Walkers. Scooters. Crutches. Turn on the lights when you go into a dark area. Replace any light bulbs as soon as they burn out. Set up your furniture so you have a clear path. Avoid moving your furniture around. If any of your floors are uneven, fix them. If there are any pets around you, be aware of where they are. Review your medicines with your doctor. Some medicines can make you feel dizzy. This can increase your chance of falling. Ask your doctor what other things that you can do to help prevent falls. This information is not intended to replace advice given to you by your health care provider. Make sure you discuss any questions you have with your health care provider. Document Released: 07/18/2009 Document Revised: 02/27/2016 Document Reviewed: 10/26/2014 Elsevier Interactive Patient Education  2017 [employer].

## 2022-12-14 NOTE — Progress Notes (Signed)
Subjective:   Amber Morgan is a 89 y.o. female who presents for Medicare Annual (Subsequent) preventive examination.  Review of Systems     Cardiac Risk Factors include: advanced age (>44mn, >>3women);dyslipidemia     Objective:    Today's Vitals   12/14/22 1449 12/14/22 1506  BP: (!) 149/73 117/66  Pulse: 83 73  Weight: 134 lb 12.8 oz (61.1 kg)   Height: '4\' 11"'$  (1.499 m)    Body mass index is 27.23 kg/m.     12/14/2022    2:53 PM 12/09/2021    1:21 PM 09/21/2021    5:29 PM 08/20/2021    2:51 PM 08/18/2021    5:23 PM 07/17/2020    5:43 PM 04/21/2019   11:09 AM  Advanced Directives  Does Patient Have a Medical Advance Directive? Yes Yes Yes Yes No Yes Yes  Type of AParamedicof AKey CenterLiving will Healthcare Power of Attorney Living will;Healthcare Power of [place] will  Does patient want to make changes to medical advance directive? No - Patient declined      No - Patient declined  Copy of [place] Chart? No - copy requested No - copy requested     No - copy requested    Current Medications (verified) Outpatient Encounter Medications as of 12/14/2022  Medication Sig   aspirin 81 MG EC tablet Take 81 mg by mouth daily. Swallow whole.   calcium-vitamin D (OSCAL WITH D) 500-200 MG-UNIT tablet Take 1 tablet by mouth 2 (two) times daily.   cholecalciferol (VITAMIN D) 1000 units tablet Take 1 tablet (1,000 Units total) by mouth daily.   Fiber POWD Take 2 Scoops by mouth 2 (two) times daily.   ketorolac (ACULAR) 0.5 % ophthalmic solution Place 2 drops into the right eye daily.   Multiple Vitamins-Calcium (ONE-A-DAY WITHIN PO) Take 1 tablet by mouth daily.   Polyethyl Glycol-Propyl Glycol (SYSTANE OP) Place 1 drop into both eyes 3 (three) times daily. Put in both eyes   sodium chloride (MURO 128) 5 % ophthalmic ointment Place 1 application into the right eye at bedtime.   Vibegron 75 MG  TABS Take 75 mg by mouth daily.   No facility-administered encounter medications on file as of 12/14/2022.    Allergies (verified) Amlodipine, Amoxicillin, and Doxycycline   History: Past Medical History:  Diagnosis Date   Allergy    Arthritis    Blind right eye    [place]    Colon polyps    Diverticulitis    Diverticulosis 01/04/2014   Glaucoma    Hernia, inguinal, right    History of chicken pox    childhood   History of hiatal hernia    HTN (hypertension)    Hyperglycemia 09/05/2013   Lipoma of arm 12/09/2014   right   Nocturia 02/08/2013   Osteopenia 12/09/2014   Personal history of colonic polyps 09/05/2013   Pre-diabetes    Preventative health care 08/14/2016   Unspecified constipation 02/06/2013   Urinary incontinence    Viral infection characterized by skin and mucous membrane lesions 05/11/2013   Wears glasses    Past Surgical History:  Procedure Laterality Date   ABDOMINAL HYSTERECTOMY     APPENDECTOMY     BLADDER SURGERY  2003   bladder tact   BREAST BIOPSY Right 03/2018   BREAST LUMPECTOMY Right 04/2018   BREAST LUMPECTOMY WITH RADIOACTIVE SEED LOCALIZATION  Right 04/12/2018   Procedure: BREAST LUMPECTOMY WITH RADIOACTIVE SEED LOCALIZATION;  Surgeon: [name], MD;  Location: [facility];  Service: General;  Laterality: Right;   CATARACT EXTRACTION, BILATERAL     COLONOSCOPY W/ BIOPSIES AND POLYPECTOMY     EYE SURGERY     multiple bilateral   INGUINAL HERNIA REPAIR Right 04/12/2018   Procedure: OPEN RIGHT INGUINAL HERNIA REPAIR WITH MESH;  Surgeon: [name], MD;  Location: [facility];  Service: General;  Laterality: Right;   RECTOCELE REPAIR Bilateral    Family History  Problem Relation Age of Onset   Colon cancer Mother    Hypertension Mother    Heart disease Father    Meniere's disease Father    Heart disease Sister    Arthritis Son        back   Glaucoma Son    Hypertension Son    Cataracts Son    Colon cancer Sister    Alzheimer's  disease Sister    Glaucoma Sister    Glaucoma Sister    Hyperlipidemia Sister    Hypertension Sister    Cancer Sister        thyroid   Diabetes Sister    Glaucoma Sister    Breast cancer Paternal Aunt    Breast cancer Paternal Aunt    Social History   Socioeconomic History   Marital status: Married    Spouse name: Not on file   Number of children: 4   Years of education: Not on file   Highest education level: Not on file  Occupational History   Not on file  Tobacco Use   Smoking status: Never   Smokeless tobacco: Never  Vaping Use   Vaping Use: Never used  Substance and Sexual Activity   Alcohol use: No   Drug use: No   Sexual activity: Not Currently  Other Topics Concern   Not on file  Social History Narrative   Not on file   Social Determinants of Health   Financial Resource Strain: Low Risk  (12/09/2021)   Overall Financial Resource Strain (CARDIA)    Difficulty of Paying Living Expenses: Not hard at all  Food Insecurity: No Food Insecurity (12/14/2022)   Hunger Vital Sign    Worried About Running Out of Food in the Last Year: Never true    Ran Out of Food in the Last Year: Never true  Transportation Needs: No Transportation Needs (12/09/2021)   PRAPARE - Hydrologist (Medical): No    Lack of Transportation (Non-Medical): No  Physical Activity: Sufficiently Active (12/09/2021)   Exercise Vital Sign    Days of Exercise per Week: 7 days    Minutes of Exercise per Session: 60 min  Stress: No Stress Concern Present (12/09/2021)   [place]    Feeling of Stress : Not at all  Social Connections: Moderately Isolated (12/09/2021)   Social Connection and Isolation Panel [NHANES]    Frequency of Communication with Friends and Family: More than three times a week    Frequency of Social Gatherings with Friends and Family: More than three times a week    Attends Religious Services:  More than 4 times per year    Active Member of [employer] or Organizations: No    Attends Archivist Meetings: Never    Marital Status: Widowed    Tobacco Counseling Counseling given: Not Answered   Clinical Intake:  Pre-visit preparation completed:  Yes  Pain : No/denies pain  Diabetes: No  How often do you need to have someone help you when you read instructions, pamphlets, or other written materials from your doctor or pharmacy?: 1 - Never   Activities of Daily Living    12/14/2022    2:58 PM  In your present state of health, do you have any difficulty performing the following activities:  Hearing? 0  Vision? 1  Difficulty concentrating or making decisions? 0  Walking or climbing stairs? 0  Dressing or bathing? 0  Doing errands, shopping? 1  Comment poor eyesight  Preparing Food and eating ? N  Using the Toilet? N  In the past six months, have you accidently leaked urine? Y  Do you have problems with loss of bowel control? Y  Comment occasionally  Managing your Medications? N  Managing your Finances? N  Housekeeping or managing your Housekeeping? N    Patient Care Team: Mosie Lukes, MD as PCP - General (Family Medicine) Jerline Pain Mingo Geralynn Capri, DO as Consulting Physician (Osteopathic Medicine) Rolan Lipa, MD as Consulting Physician (Gastroenterology) Stanford Breed Denice Bors, MD as Consulting Physician (Cardiology) [name], MD as Consulting Physician (General Surgery) Magrinat, Virgie Dad, MD (Inactive) as Consulting Physician (Oncology) Eppie Gibson, MD as Attending Physician (Radiation Oncology)  Indicate any recent Medical Services you may have received from other than [facility] providers in the past year (date may be approximate).     Assessment:   This is a routine wellness examination for Chrisanna.  Hearing/Vision screen No results found.  Dietary issues and exercise activities discussed: Current Exercise Habits: Home exercise routine, Time  (Minutes): 60, Frequency (Times/Week): 3 (weather permitting), Weekly Exercise (Minutes/Week): 180, Intensity: Mild, Exercise limited by: None identified   Goals Addressed   None    Depression Screen    12/14/2022    2:57 PM 05/25/2022    3:50 PM 12/09/2021    1:17 PM 12/09/2021    1:16 PM 11/28/2020   10:27 AM 09/21/2019   10:33 AM 04/21/2019   11:10 AM  PHQ 2/9 Scores  PHQ - 2 Score 0 0 0 0 0 0 0  PHQ- 9 Score     2      Fall Risk    12/14/2022    2:54 PM 05/25/2022    3:50 PM 12/09/2021    1:23 PM 11/28/2020   10:26 AM 09/21/2019   10:33 AM  [place] in the past year? 0 0 0 0 0  Number falls in past yr: 0 0 0 0   Injury with Fall? 0 0 0 0   Risk for fall due to : No Fall Risks  Impaired vision    Follow up Falls evaluation completed Falls evaluation completed Falls prevention discussed      FALL RISK PREVENTION PERTAINING TO THE HOME:  Any stairs in or around the home? Yes  If so, are there any without handrails? No  Home free of loose throw rugs in walkways, pet beds, electrical cords, etc? Yes  Adequate lighting in your home to reduce risk of falls? Yes   ASSISTIVE DEVICES UTILIZED TO PREVENT FALLS:  Life alert? Yes  Use of a cane, walker or w/c? No  Grab bars in the bathroom? Yes  Shower chair or bench in shower? Yes  Elevated toilet seat or a handicapped toilet? No   TIMED UP AND GO:  Was the test performed? Yes .  Length of time to ambulate 10 feet:  8 sec.   Gait slow and steady without use of assistive device  Cognitive Function:    02/22/2017   11:27 AM  MMSE - Mini Mental State Exam  Orientation to time 5  Orientation to Place 5  Registration 3  Attention/ Calculation 4  Recall 1  Language- name 2 objects 2  Language- repeat 1  Language- follow 3 step command 3  Language- read & follow direction 1  Write a sentence 0  Copy design 1  Total score 26        12/14/2022    3:04 PM 12/09/2021    1:19 PM  6CIT Screen  What Year? 0 points 0  points  What month? 0 points 0 points  What time? 3 points 0 points  Count back from 20 0 points 0 points  Months in reverse 0 points 0 points  Repeat phrase 2 points 0 points  Total Score 5 points 0 points    Immunizations Immunization History  Administered Date(s) Administered   Fluad Quad(high Dose 65+) 06/17/2021   Influenza Split 07/13/2012   Influenza, High Dose Seasonal PF 06/22/2016, 06/04/2017, 06/07/2018, 07/05/2019   Influenza,inj,Quad PF,6+ Mos 06/22/2013, 06/01/2014, 07/08/2015   Moderna Covid-19 Vaccine Bivalent Booster 24yr & up 07/14/2021   Moderna Sars-Covid-2 Vaccination 10/04/2019, 11/01/2019, 08/23/2020, 02/20/2021   Pneumococcal Conjugate-13 06/01/2014   Pneumococcal Polysaccharide-23 12/07/2009   Tdap 05/21/2013   Zoster Recombinat (Shingrix) 07/07/2018, 09/12/2018   Zoster, Live 07/14/2011    TDAP status: Up to date  Flu Vaccine status: Up to date  Pneumococcal vaccine status: Up to date  Covid-19 vaccine status: Information provided on how to obtain vaccines.   Qualifies for Shingles Vaccine? Yes   Zostavax completed Yes   Shingrix Completed?: Yes  Screening Tests Health Maintenance  Topic Date Due   COVID-19 Vaccine (6 - 2023-24 season) 06/05/2022   Medicare Annual Wellness (AWV)  12/10/2022   DTaP/Tdap/Td (2 - Td or Tdap) 05/22/2023   Pneumonia Vaccine 89 Years old  Completed   INFLUENZA VACCINE  Completed   DEXA SCAN  Completed   Zoster Vaccines- Shingrix  Completed   HPV VACCINES  Aged Out   MAMMOGRAM  Discontinued    Health Maintenance  Health Maintenance Due  Topic Date Due   COVID-19 Vaccine (6 - 2023-24 season) 06/05/2022   Medicare Annual Wellness (AWV)  12/10/2022    Colorectal cancer screening: No longer required.   Mammogram status: Completed 07/27/22. Repeat every year  Bone Density status: Completed 12/19/20. Results reflect: Bone density results: OSTEOPENIA. Repeat every 2 years.  Lung Cancer Screening: (Low Dose  CT Chest recommended if Age 87-80years, 30 pack-year currently smoking OR have quit w/in 15years.) does not qualify.   Additional Screening:  Hepatitis C Screening: does not qualify  Vision Screening: Recommended annual ophthalmology exams for early detection of glaucoma and other disorders of the eye. Is the patient up to date with their annual eye exam?  Yes  Who is the provider or what is the name of the office in which the patient attends annual eye exams? Dr. SBaird CancerIf pt is not established with a provider, would they like to be referred to a provider to establish care? No .   Dental Screening: Recommended annual dental exams for proper oral hygiene  Community Resource Referral / Chronic Care Management: CRR required this visit?  No   CCM required this visit?  No      Plan:     I have personally reviewed  and noted the following in the patient's chart:   Medical and social history Use of alcohol, tobacco or illicit drugs  Current medications and supplements including opioid prescriptions. Patient is not currently taking opioid prescriptions. Functional ability and status Nutritional status Physical activity Advanced directives List of other physicians Hospitalizations, surgeries, and ER visits in previous 12 months Vitals Screenings to include cognitive, depression, and falls Referrals and appointments  In addition, I have reviewed and discussed with patient certain preventive protocols, quality metrics, and best practice recommendations. A written personalized care plan for preventive services as well as general preventive health recommendations were provided to patient.     Beatris Ship, [place]   12/14/2022   Nurse Notes: None

## 2023-01-03 NOTE — Assessment & Plan Note (Deleted)
hgba1c acceptable, minimize simple carbs. Increase exercise as tolerated.  

## 2023-01-03 NOTE — Assessment & Plan Note (Deleted)
RRR today 

## 2023-01-03 NOTE — Assessment & Plan Note (Deleted)
Encourage heart healthy diet such as MIND or DASH diet, increase exercise, avoid trans fats, simple carbohydrates and processed foods, consider a krill or fish or flaxseed oil cap daily.  °

## 2023-01-03 NOTE — Assessment & Plan Note (Deleted)
Well controlled, no changes to meds. Encouraged heart healthy diet such as the DASH diet and exercise as tolerated.  °

## 2023-01-04 ENCOUNTER — Ambulatory Visit: Payer: Medicare HMO | Admitting: Family Medicine

## 2023-01-04 DIAGNOSIS — R739 Hyperglycemia, unspecified: Secondary | ICD-10-CM

## 2023-01-04 DIAGNOSIS — E785 Hyperlipidemia, unspecified: Secondary | ICD-10-CM

## 2023-01-04 DIAGNOSIS — R001 Bradycardia, unspecified: Secondary | ICD-10-CM

## 2023-01-04 DIAGNOSIS — I10 Essential (primary) hypertension: Secondary | ICD-10-CM

## 2023-01-04 NOTE — Progress Notes (Unsigned)
   Established Patient Office Visit  Subjective   Patient ID: Amber Morgan, female    DOB: 1927-08-15  Age: 89 y.o. MRN: [id number]  No chief complaint on file.   HPI   Patient is here for 58-month follow-up.   Hypertension/Tachycardia: - Medications: Maxide 37.5-25 mg daily - Compliance: *** - Checking BP at home: *** - Denies any SOB, recurrent headaches, CP, vision changes, LE edema, dizziness, palpitations, or medication side effects. - Diet: *** - Exercise: *** - Stressors:  Urinary frequency/OAB: - Medications: Vibegron 75 mg dialy  - *** Following with urogynecology.    Hyperlipidemia: - medications: *** - compliance: *** - medication SEs: *** The ASCVD Risk score (Arnett DK, et al., 2019) failed to calculate for the following reasons:   The 2019 ASCVD risk score is only valid for ages 10 to 81          {History (Optional):23778}  ROS    Objective:     There were no vitals taken for this visit. {Vitals History (Optional):23777}  Physical Exam   No results found for any visits on 01/05/23.  {Labs (Optional):23779}  The ASCVD Risk score (Arnett DK, et al., 2019) failed to calculate for the following reasons:   The 2019 ASCVD risk score is only valid for ages 46 to 73    Assessment & Plan:   Problem List Items Addressed This Visit     HTN (hypertension) - Primary   Relevant Medications   triamterene-hydrochlorothiazide (MAXZIDE-25) 37.5-25 MG tablet   Hyperlipidemia   Relevant Medications   triamterene-hydrochlorothiazide (MAXZIDE-25) 37.5-25 MG tablet    No follow-ups on file.    Terrilyn Saver, NP

## 2023-01-05 ENCOUNTER — Ambulatory Visit (INDEPENDENT_AMBULATORY_CARE_PROVIDER_SITE_OTHER): Payer: Medicare HMO | Admitting: Family Medicine

## 2023-01-05 ENCOUNTER — Encounter: Payer: Self-pay | Admitting: Family Medicine

## 2023-01-05 VITALS — BP 148/65 | HR 92 | Ht 59.0 in | Wt 133.0 lb

## 2023-01-05 DIAGNOSIS — N3281 Overactive bladder: Secondary | ICD-10-CM

## 2023-01-05 DIAGNOSIS — Z111 Encounter for screening for respiratory tuberculosis: Secondary | ICD-10-CM

## 2023-01-05 DIAGNOSIS — I10 Essential (primary) hypertension: Secondary | ICD-10-CM | POA: Diagnosis not present

## 2023-01-05 DIAGNOSIS — E785 Hyperlipidemia, unspecified: Secondary | ICD-10-CM

## 2023-01-05 LAB — CBC WITH DIFFERENTIAL/PLATELET
Basophils Absolute: 0 10*3/uL (ref 0.0–0.1)
Basophils Relative: 0.3 % (ref 0.0–3.0)
Eosinophils Absolute: 0.1 10*3/uL (ref 0.0–0.7)
Eosinophils Relative: 0.6 % (ref 0.0–5.0)
HCT: 37.1 % (ref 36.0–46.0)
Hemoglobin: 12.7 g/dL (ref 12.0–15.0)
Lymphocytes Relative: 18.3 % (ref 12.0–46.0)
Lymphs Abs: 1.8 10*3/uL (ref 0.7–4.0)
MCHC: 34.3 g/dL (ref 30.0–36.0)
MCV: 93.3 fl (ref 78.0–100.0)
Monocytes Absolute: 0.8 10*3/uL (ref 0.1–1.0)
Monocytes Relative: 8.7 % (ref 3.0–12.0)
Neutro Abs: 7.1 10*3/uL (ref 1.4–7.7)
Neutrophils Relative %: 72.1 % (ref 43.0–77.0)
Platelets: 261 10*3/uL (ref 150.0–400.0)
RBC: 3.97 Mil/uL (ref 3.87–5.11)
RDW: 13.2 % (ref 11.5–15.5)
WBC: 9.8 10*3/uL (ref 4.0–10.5)

## 2023-01-05 LAB — URINALYSIS, ROUTINE W REFLEX MICROSCOPIC
Bilirubin Urine: NEGATIVE
Hgb urine dipstick: NEGATIVE
Ketones, ur: NEGATIVE
Leukocytes,Ua: NEGATIVE
Nitrite: NEGATIVE
RBC / HPF: NONE SEEN (ref 0–?)
Specific Gravity, Urine: 1.015 (ref 1.000–1.030)
Total Protein, Urine: NEGATIVE
Urine Glucose: NEGATIVE
Urobilinogen, UA: 0.2 (ref 0.0–1.0)
pH: 6 (ref 5.0–8.0)

## 2023-01-05 NOTE — Assessment & Plan Note (Signed)
Lifestyle factors for lowering cholesterol include: Diet therapy - heart-healthy diet rich in fruits, veggies, fiber-rich whole grains, lean meats, chicken, fish (at least twice a week), fat-free or 1% dairy products; foods low in saturated/trans fats, cholesterol, sodium, and sugar. Mediterranean diet has shown to be very heart healthy. Regular exercise - recommend at least 30 minutes a day, 5 times per week

## 2023-01-05 NOTE — Assessment & Plan Note (Signed)
Mildly elevated today, but good readings at home. No changes at this time. Continue with healthy lifestyle measures.

## 2023-01-05 NOTE — Assessment & Plan Note (Signed)
Stable. No new concerns. Getting some improvement with occasional Vibegron.

## 2023-01-06 LAB — COMPLETE METABOLIC PANEL WITH GFR
AG Ratio: 1.7 (calc) (ref 1.0–2.5)
ALT: 16 U/L (ref 6–29)
AST: 16 U/L (ref 10–35)
Albumin: 4 g/dL (ref 3.6–5.1)
Alkaline phosphatase (APISO): 48 U/L (ref 37–153)
BUN/Creatinine Ratio: 27 (calc) — ABNORMAL HIGH (ref 6–22)
BUN: 28 mg/dL — ABNORMAL HIGH (ref 7–25)
CO2: 27 mmol/L (ref 20–32)
Calcium: 10.3 mg/dL (ref 8.6–10.4)
Chloride: 102 mmol/L (ref 98–110)
Creat: 1.04 mg/dL — ABNORMAL HIGH (ref 0.60–0.95)
Globulin: 2.4 g/dL (calc) (ref 1.9–3.7)
Glucose, Bld: 97 mg/dL (ref 65–99)
Potassium: 4.3 mmol/L (ref 3.5–5.3)
Sodium: 138 mmol/L (ref 135–146)
Total Bilirubin: 0.5 mg/dL (ref 0.2–1.2)
Total Protein: 6.4 g/dL (ref 6.1–8.1)
eGFR: 50 mL/min/{1.73_m2} — ABNORMAL LOW (ref >=60)

## 2023-01-06 LAB — LIPID PANEL W/REFLEX DIRECT LDL
Cholesterol: 151 mg/dL (ref <200)
HDL: 60 mg/dL (ref >=50)
LDL Cholesterol (Calc): 75 mg/dL (calc)
Non-HDL Cholesterol (Calc): 91 mg/dL (calc) (ref <130)
Total CHOL/HDL Ratio: 2.5 (calc) (ref <5.0)
Triglycerides: 80 mg/dL (ref <150)

## 2023-01-07 LAB — QUANTIFERON-TB GOLD PLUS
Mitogen-NIL: >10 IU/mL
NIL: 0.02 IU/mL
QuantiFERON-TB Gold Plus: NEGATIVE
TB1-NIL: 0.02 IU/mL
TB2-NIL: 0.01 IU/mL

## 2023-05-17 DIAGNOSIS — H401133 Primary open-angle glaucoma, bilateral, severe stage: Secondary | ICD-10-CM | POA: Diagnosis not present

## 2023-05-17 DIAGNOSIS — H524 Presbyopia: Secondary | ICD-10-CM | POA: Diagnosis not present

## 2023-05-17 DIAGNOSIS — H44522 Atrophy of globe, left eye: Secondary | ICD-10-CM | POA: Diagnosis not present

## 2023-05-17 DIAGNOSIS — H52221 Regular astigmatism, right eye: Secondary | ICD-10-CM | POA: Diagnosis not present

## 2023-05-17 DIAGNOSIS — H5211 Myopia, right eye: Secondary | ICD-10-CM | POA: Diagnosis not present

## 2023-05-17 DIAGNOSIS — H52211 Irregular astigmatism, right eye: Secondary | ICD-10-CM | POA: Diagnosis not present

## 2023-06-06 IMAGING — CT CT HEAD W/O CM
3 series · 16 of 47 positions shown, 19 images · non-contrast
Comparison: None.

CLINICAL DATA: Head trauma, minor (Age >= 65y) head trauma to left
side of head - no headache

EXAM:
CT HEAD WITHOUT CONTRAST
TECHNIQUE: Contiguous axial images were obtained from the base of the skull
through the vertex without intravenous contrast.

[Series 2: head wo · axial · 0.44mm/px · z∈[+140,+285]mm · 10 of 35 slices shown, 13 images]
[im 3/35  brain]
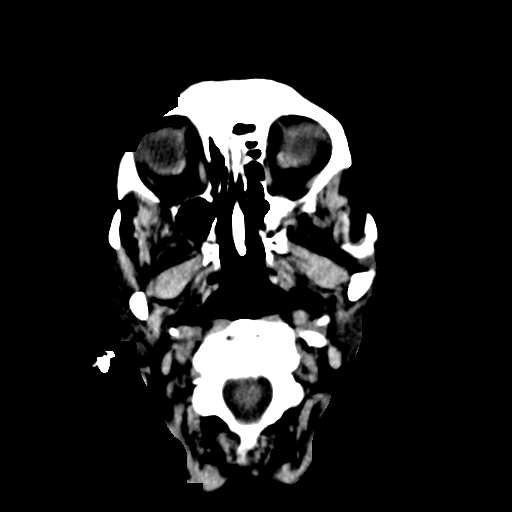
[im 3/35  bone]
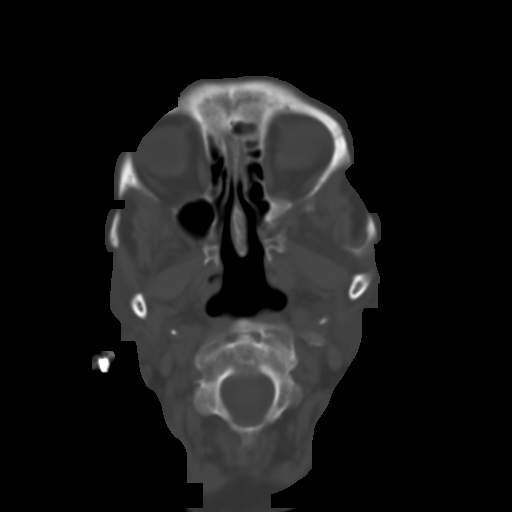
[im 6/35  brain]
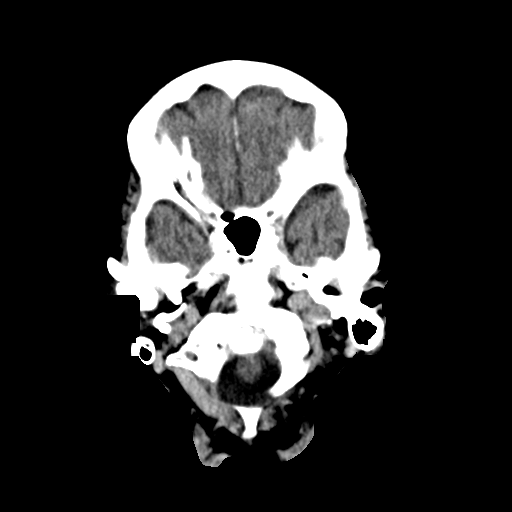
[im 10/35  brain]
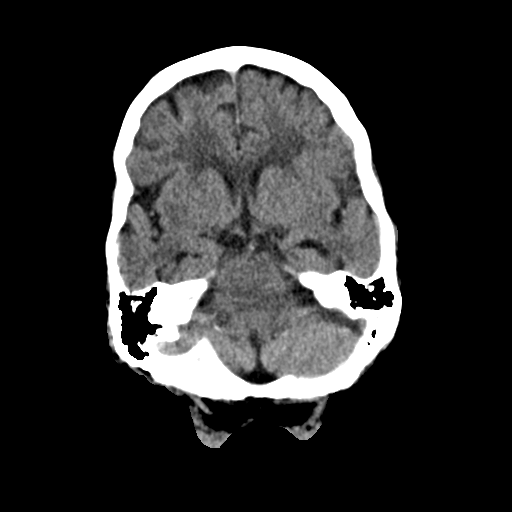
[im 12/35  brain]
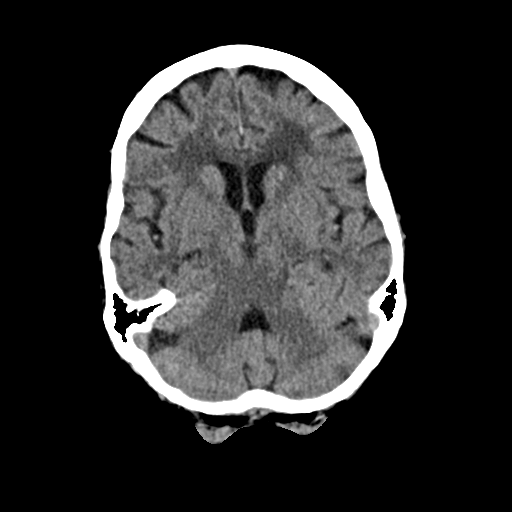
[im 16/35  brain]
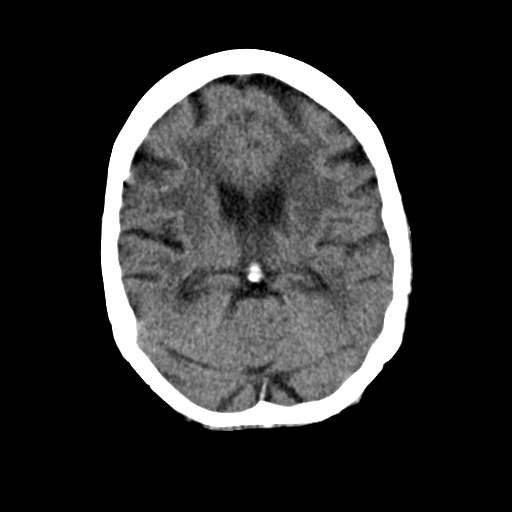
[im 16/35  bone]
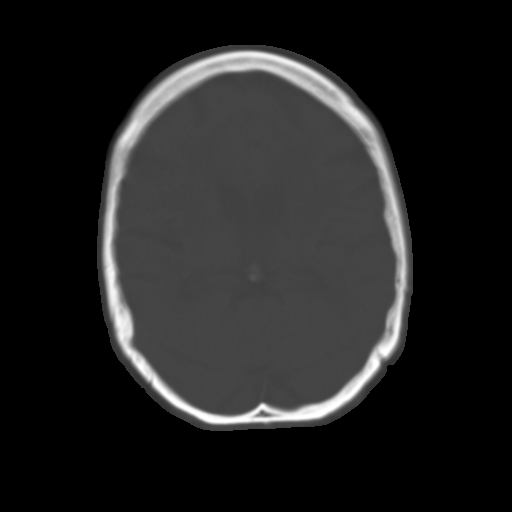
[im 19/35  brain]
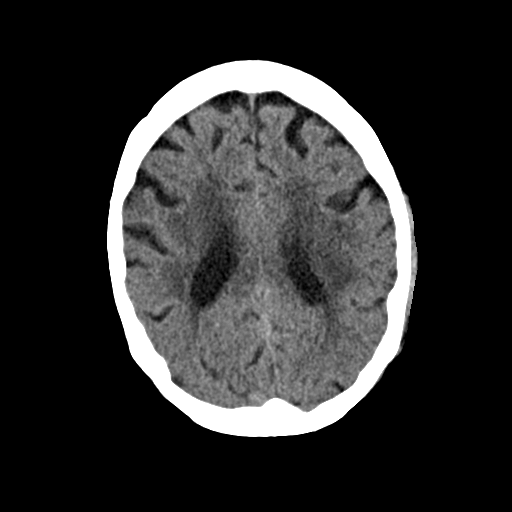
[im 23/35  brain]
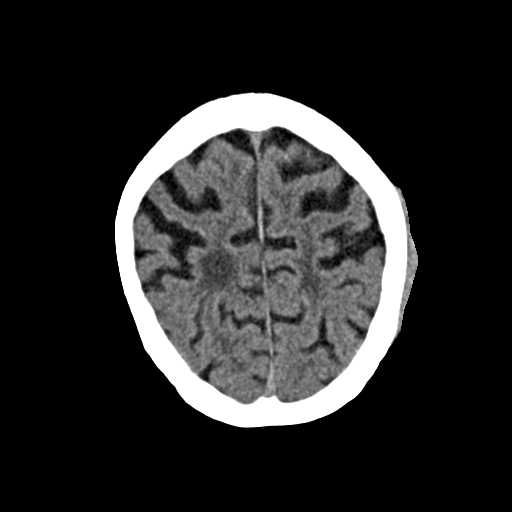
[im 26/35  brain]
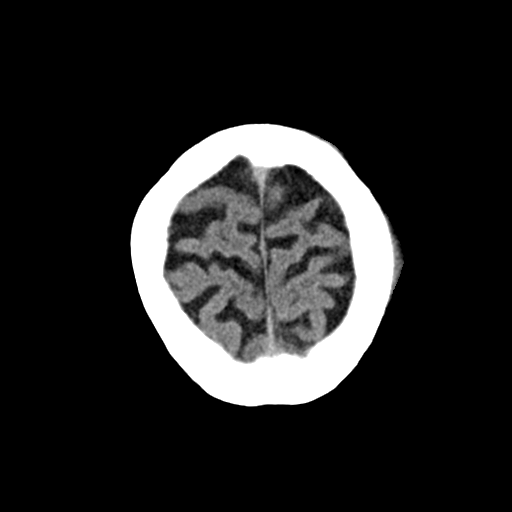
[im 29/35  brain]
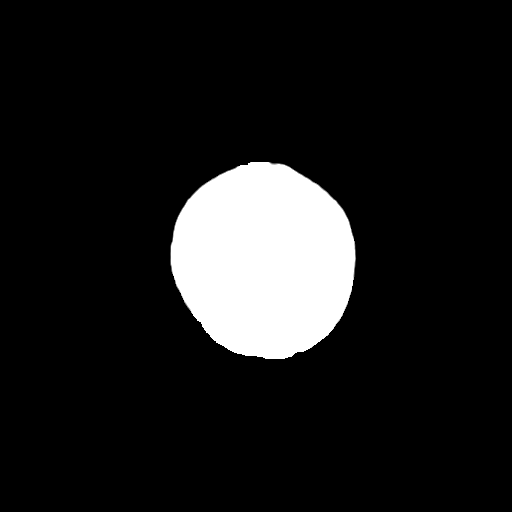
[im 29/35  bone]
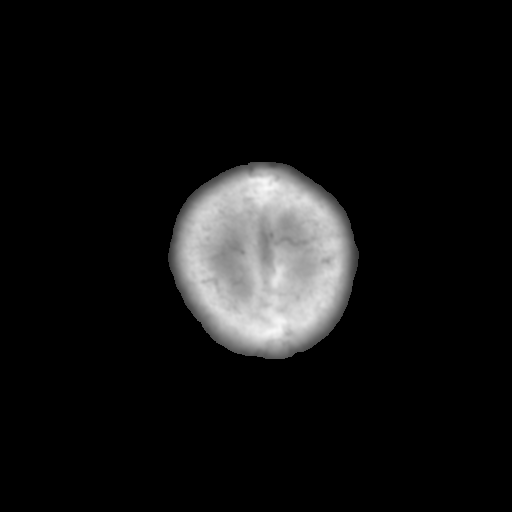
[im 32/35  brain]
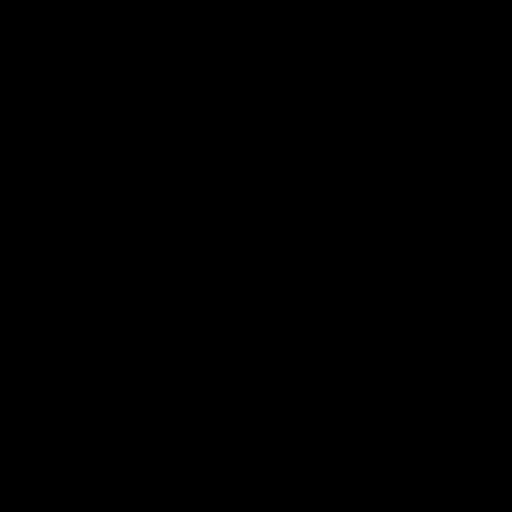

[Series 4: coronal soft · coronal · 0.34mm/px · 3 of 67 slices shown]
[im 23/67  brain]
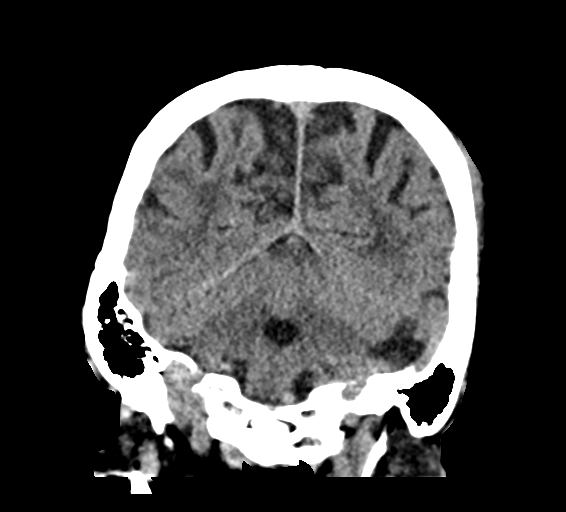
[im 30/67  brain]
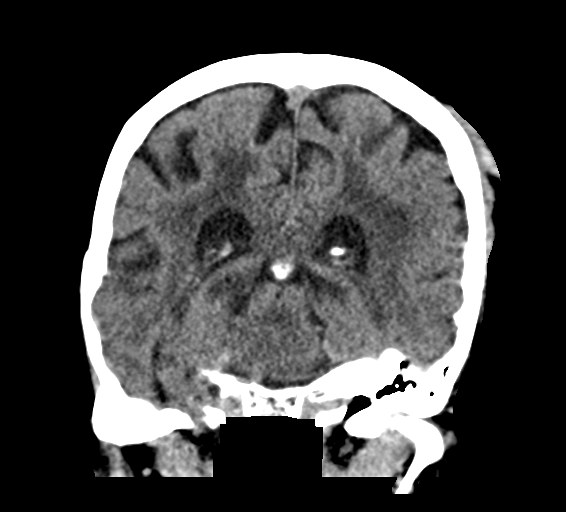
[im 37/67  brain]
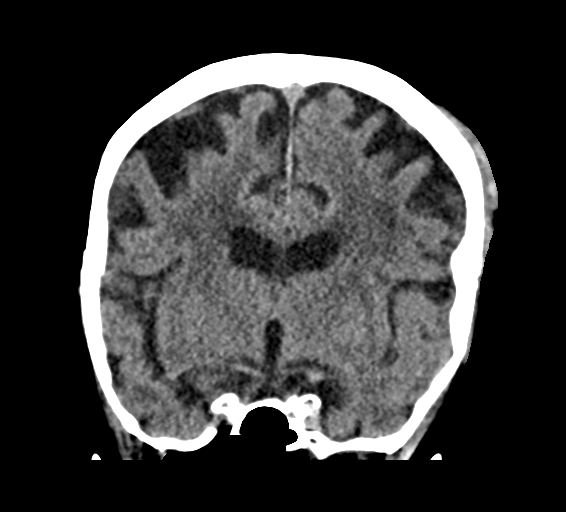

[Series 5: sag soft · sagittal · 0.34mm/px · 3 of 67 slices shown]
[im 23/67  brain]
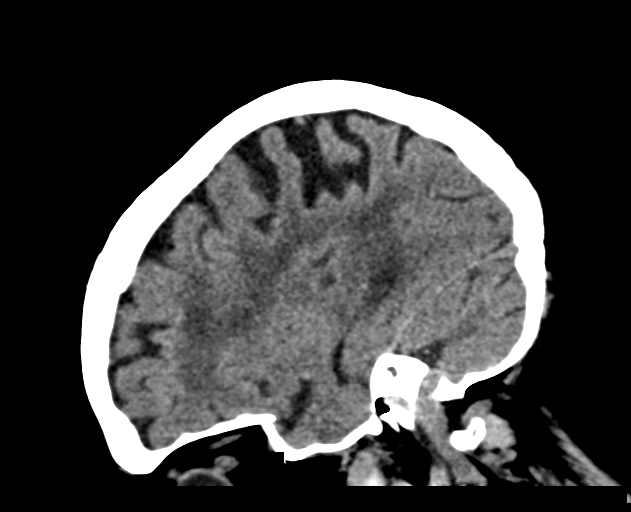
[im 34/67  brain]
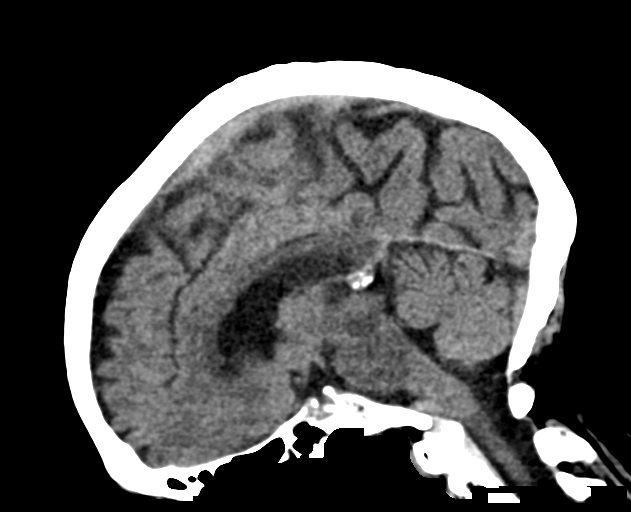
[im 45/67  brain]
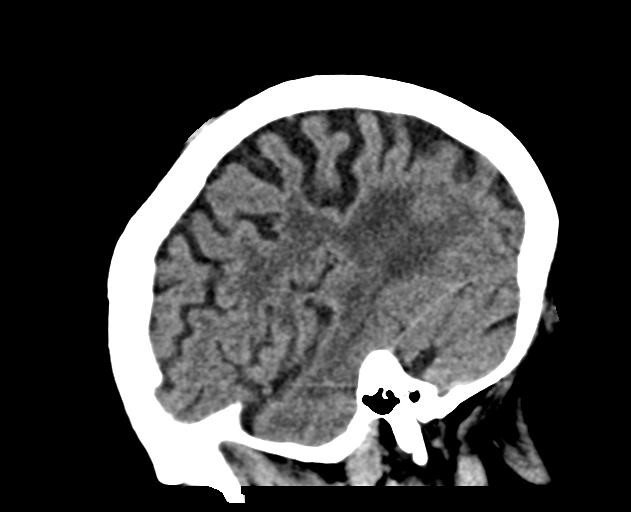

[16 of 47 positions shown; findings below may reference images not displayed]

FINDINGS: Brain: There is atrophy and chronic small vessel disease changes. No
acute intracranial abnormality. Specifically, no hemorrhage,
hydrocephalus, mass lesion, acute infarction, or significant
intracranial injury.

Vascular: No hyperdense vessel or unexpected calcification.

Skull: No acute calvarial abnormality.

Sinuses/Orbits: No acute findings

Other: Soft tissue swelling in the left lateral scalp.
IMPRESSION: Atrophy, chronic microvascular disease.

No acute intracranial abnormality.

## 2023-06-08 DIAGNOSIS — H43811 Vitreous degeneration, right eye: Secondary | ICD-10-CM | POA: Diagnosis not present

## 2023-06-08 DIAGNOSIS — H35361 Drusen (degenerative) of macula, right eye: Secondary | ICD-10-CM | POA: Diagnosis not present

## 2023-06-08 DIAGNOSIS — H3581 Retinal edema: Secondary | ICD-10-CM | POA: Diagnosis not present

## 2023-06-08 DIAGNOSIS — H35371 Puckering of macula, right eye: Secondary | ICD-10-CM | POA: Diagnosis not present

## 2023-06-22 ENCOUNTER — Ambulatory Visit (INDEPENDENT_AMBULATORY_CARE_PROVIDER_SITE_OTHER): Payer: Medicare HMO | Admitting: Obstetrics and Gynecology

## 2023-06-22 ENCOUNTER — Encounter: Payer: Self-pay | Admitting: Obstetrics and Gynecology

## 2023-06-22 VITALS — BP 128/74 | HR 79

## 2023-06-22 DIAGNOSIS — N3281 Overactive bladder: Secondary | ICD-10-CM | POA: Diagnosis not present

## 2023-06-22 DIAGNOSIS — R351 Nocturia: Secondary | ICD-10-CM

## 2023-06-22 MED ORDER — VIBEGRON 75 MG PO TABS: 75.0000 mg | ORAL_TABLET | Freq: Every day | ORAL | 11 refills | Status: DC

## 2023-06-22 NOTE — Progress Notes (Signed)
[name] Return Visit  SUBJECTIVE  History of Present Illness: Amber Morgan is a 89 y.o. female seen in follow-up for OAB/ Nocturia. She is on Gemtesa 75mg  daily.   She can usually hold her urine every 3-4 hours but sometimes has some urgency. Leaks a small amount on the way to the bathroom. At night, still waking 2 times per night. She feels that overall her symptoms have been a lot better and she is happy with the medication.    Past Medical History: Patient  has a past medical history of Allergy, Arthritis, Blind right eye, Cancer (HCC), Colon polyps, Diverticulitis, Diverticulosis (01/04/2014), Glaucoma, Hernia, inguinal, right, History of chicken pox, History of hiatal hernia, HTN (hypertension), Hyperglycemia (09/05/2013), Lipoma of arm (12/09/2014), Nocturia (02/08/2013), Osteopenia (12/09/2014), Personal history of colonic polyps (09/05/2013), Pre-diabetes, Preventative health care (08/14/2016), Unspecified constipation (02/06/2013), Urinary incontinence, Viral infection characterized by skin and mucous membrane lesions (05/11/2013), and Wears glasses.   Past Surgical History: She  has a past surgical history that includes Eye surgery; Bladder surgery (2003); Abdominal hysterectomy; Rectocele repair (Bilateral); Cataract extraction, bilateral; Appendectomy; Colonoscopy w/ biopsies and polypectomy; Breast lumpectomy with radioactive seed localization (Right, 04/12/2018); Inguinal hernia repair (Right, 04/12/2018); Breast lumpectomy (Right, 04/2018); and Breast biopsy (Right, 03/2018).   Medications: She has a current medication list which includes the following prescription(s): aspirin ec, calcium-vitamin d, cholecalciferol, fiber, ketorolac, multiple vitamins-calcium, polyethyl glycol-propyl glycol, sodium chloride, sodium chloride, and vibegron.   Allergies: Patient is allergic to amlodipine, amoxicillin, and doxycycline.   Social History: Patient  reports that she has never smoked.  She has never used smokeless tobacco. She reports that she does not drink alcohol and does not use drugs.      OBJECTIVE     Physical Exam: Vitals:   06/22/23 1138  BP: 128/74  Pulse: 79    Gen: No apparent distress, A&O x 3.  Detailed Urogynecologic Evaluation:  Deferred.   ASSESSMENT AND PLAN    Amber Morgan is a 89 y.o. with:  1. Overactive bladder   2. Nocturia     - Continue with Gemtesa 75mg  daily, refills provided.  - Discussed switching to Myrbetriq but she wants to stay with same medication for now.   Follow up 1 year or sooner if needed  Marguerita Beards, MD   Time spent: I spent 20 minutes dedicated to the care of this patient on the date of this encounter to include pre-visit review of records, face-to-face time with the patient and post visit documentation.

## 2023-07-07 NOTE — Assessment & Plan Note (Signed)
Encouraged to get adequate exercise, calcium and vitamin d intake 

## 2023-07-07 NOTE — Assessment & Plan Note (Signed)
hgba1c acceptable, minimize simple carbs. Increase exercise as tolerated.  

## 2023-07-07 NOTE — Assessment & Plan Note (Signed)
Repeat blood pressure much improved and consistent with the numbers they are seeing at home. No changes today

## 2023-07-07 NOTE — Assessment & Plan Note (Signed)
Encourage heart healthy diet such as MIND or DASH diet, increase exercise, avoid trans fats, simple carbohydrates and processed foods, consider a krill or fish or flaxseed oil cap daily.  °

## 2023-07-08 ENCOUNTER — Ambulatory Visit (INDEPENDENT_AMBULATORY_CARE_PROVIDER_SITE_OTHER): Payer: Medicare HMO | Admitting: Family Medicine

## 2023-07-08 VITALS — BP 138/62 | HR 82 | Temp 97.8°F | Resp 16 | Ht 59.0 in | Wt 127.8 lb

## 2023-07-08 DIAGNOSIS — R739 Hyperglycemia, unspecified: Secondary | ICD-10-CM | POA: Diagnosis not present

## 2023-07-08 DIAGNOSIS — I10 Essential (primary) hypertension: Secondary | ICD-10-CM

## 2023-07-08 DIAGNOSIS — M8580 Other specified disorders of bone density and structure, unspecified site: Secondary | ICD-10-CM

## 2023-07-08 DIAGNOSIS — E785 Hyperlipidemia, unspecified: Secondary | ICD-10-CM

## 2023-07-08 NOTE — Patient Instructions (Addendum)
Tetanus at pharmacy  RSV, Respiratory Syncitial Virus Vaccine, Arexvy a pharmacy

## 2023-07-09 LAB — COMPREHENSIVE METABOLIC PANEL
ALT: 18 U/L (ref 0–35)
AST: 19 U/L (ref 0–37)
Albumin: 4.1 g/dL (ref 3.5–5.2)
Alkaline Phosphatase: 56 U/L (ref 39–117)
BUN: 27 mg/dL — ABNORMAL HIGH (ref 6–23)
CO2: 28 meq/L (ref 19–32)
Calcium: 10.1 mg/dL (ref 8.4–10.5)
Chloride: 102 meq/L (ref 96–112)
Creatinine, Ser: 0.93 mg/dL (ref 0.40–1.20)
GFR: 52.79 mL/min — ABNORMAL LOW (ref >60.00)
Glucose, Bld: 94 mg/dL (ref 70–99)
Potassium: 4.2 meq/L (ref 3.5–5.1)
Sodium: 138 meq/L (ref 135–145)
Total Bilirubin: 0.4 mg/dL (ref 0.2–1.2)
Total Protein: 6.4 g/dL (ref 6.0–8.3)

## 2023-07-09 LAB — CBC WITH DIFFERENTIAL/PLATELET
Basophils Absolute: 0.1 10*3/uL (ref 0.0–0.1)
Basophils Relative: 0.8 % (ref 0.0–3.0)
Eosinophils Absolute: 0.2 10*3/uL (ref 0.0–0.7)
Eosinophils Relative: 2.3 % (ref 0.0–5.0)
HCT: 38.5 % (ref 36.0–46.0)
Hemoglobin: 12.5 g/dL (ref 12.0–15.0)
Lymphocytes Relative: 21.7 % (ref 12.0–46.0)
Lymphs Abs: 1.9 10*3/uL (ref 0.7–4.0)
MCHC: 32.5 g/dL (ref 30.0–36.0)
MCV: 96.1 fL (ref 78.0–100.0)
Monocytes Absolute: 0.8 10*3/uL (ref 0.1–1.0)
Monocytes Relative: 8.8 % (ref 3.0–12.0)
Neutro Abs: 5.7 10*3/uL (ref 1.4–7.7)
Neutrophils Relative %: 66.4 % (ref 43.0–77.0)
Platelets: 278 10*3/uL (ref 150.0–400.0)
RBC: 4.01 Mil/uL (ref 3.87–5.11)
RDW: 13.4 % (ref 11.5–15.5)
WBC: 8.6 10*3/uL (ref 4.0–10.5)

## 2023-07-09 LAB — LIPID PANEL
Cholesterol: 163 mg/dL (ref 0–200)
HDL: 62.4 mg/dL (ref >39.00)
LDL Cholesterol: 80 mg/dL (ref 0–99)
NonHDL: 101.04
Total CHOL/HDL Ratio: 3
Triglycerides: 103 mg/dL (ref 0.0–149.0)
VLDL: 20.6 mg/dL (ref 0.0–40.0)

## 2023-07-09 LAB — HEMOGLOBIN A1C: Hgb A1c MFr Bld: 6.2 % (ref 4.6–6.5)

## 2023-07-09 LAB — TSH: TSH: 3.69 u[IU]/mL (ref 0.35–5.50)

## 2023-07-11 ENCOUNTER — Encounter: Payer: Self-pay | Admitting: Family Medicine

## 2023-07-11 NOTE — Progress Notes (Signed)
Subjective:    Patient ID: Amber Morgan, female    DOB: 04-20-1927, 89 y.o.   MRN: [id number]  Chief Complaint  Patient presents with   Follow-up    Follow up    HPI Discussed the use of AI scribe software for clinical note transcription with the patient, who gave verbal consent to proceed.  History of Present Illness   The patient, a 89 year old woman with a history of hypertension and eye problems, presents for a routine check-up. She reports feeling generally well, with no recent hospitalizations or illnesses. She lives at [facility] and reports eating well and having regular bowel movements. However, she has noticed a decrease in her urine volume and an increase in the frequency of urination. She describes the urine volume as "just a little bit" each time and states that she has to urinate "a whole lot." She denies any nocturia, dysuria, or hematuria.  The patient also mentions that her eyesight is not as good as it used to be. She is currently using eye drops twice a day for one eye and once a day for the other. She does not believe her vision has worsened since reducing the frequency of one of the eye drops.  The patient's blood pressure has been well-controlled, both at home and during office visits. She denies any headaches or symptoms suggestive of poorly controlled hypertension.  The patient has been keeping well-hydrated, drinking at least six glasses of water a day, and sometimes more. She also consumes milk and has quit caffeine. She recently received her flu shot and COVID-19 vaccination.        Past Medical History:  Diagnosis Date   Allergy    Arthritis    Blind right eye    Cancer (HCC)    breast cancer    Colon polyps    Diverticulitis    Diverticulosis 01/04/2014   Glaucoma    Hernia, inguinal, right    History of chicken pox    childhood   History of hiatal hernia    HTN (hypertension)    Hyperglycemia 09/05/2013   Lipoma of arm 12/09/2014   right    Nocturia 02/08/2013   Osteopenia 12/09/2014   Personal history of colonic polyps 09/05/2013   Pre-diabetes    Preventative health care 08/14/2016   Unspecified constipation 02/06/2013   Urinary incontinence    Viral infection characterized by skin and mucous membrane lesions 05/11/2013   Wears glasses     Past Surgical History:  Procedure Laterality Date   ABDOMINAL HYSTERECTOMY     APPENDECTOMY     BLADDER SURGERY  2003   bladder tact   BREAST BIOPSY Right 03/2018   BREAST LUMPECTOMY Right 04/2018   BREAST LUMPECTOMY WITH RADIOACTIVE SEED LOCALIZATION Right 04/12/2018   Procedure: BREAST LUMPECTOMY WITH RADIOACTIVE SEED LOCALIZATION;  Surgeon: Almond Lint, MD;  Location: [facility] OR;  Service: General;  Laterality: Right;   CATARACT EXTRACTION, BILATERAL     COLONOSCOPY W/ BIOPSIES AND POLYPECTOMY     EYE SURGERY     multiple bilateral   INGUINAL HERNIA REPAIR Right 04/12/2018   Procedure: OPEN RIGHT INGUINAL HERNIA REPAIR WITH MESH;  Surgeon: Almond Lint, MD;  Location: [facility] OR;  Service: General;  Laterality: Right;   RECTOCELE REPAIR Bilateral     Family History  Problem Relation Age of Onset   Colon cancer Mother    Hypertension Mother    Heart disease Father    Meniere's disease Father  Heart disease Sister    Arthritis Son        back   Glaucoma Son    Hypertension Son    Cataracts Son    Colon cancer Sister    Alzheimer's disease Sister    Glaucoma Sister    Glaucoma Sister    Hyperlipidemia Sister    Hypertension Sister    Cancer Sister        thyroid   Diabetes Sister    Glaucoma Sister    Breast cancer Paternal Aunt    Breast cancer Paternal Aunt     Social History   Socioeconomic History   Marital status: Married    Spouse name: Not on file   Number of children: 4   Years of education: Not on file   Highest education level: Not on file  Occupational History   Not on file  Tobacco Use   Smoking status: Never   Smokeless tobacco: Never  Vaping Use    Vaping status: Never Used  Substance and Sexual Activity   Alcohol use: No   Drug use: No   Sexual activity: Not Currently  Other Topics Concern   Not on file  Social History Narrative   Not on file   Social Determinants of Health   Financial Resource Strain: Low Risk  (12/09/2021)   Overall Financial Resource Strain (CARDIA)    Difficulty of Paying Living Expenses: Not hard at all  Food Insecurity: No Food Insecurity (12/14/2022)   Hunger Vital Sign    Worried About Running Out of Food in the Last Year: Never true    Ran Out of Food in the Last Year: Never true  Transportation Needs: No Transportation Needs (12/09/2021)   PRAPARE - Administrator, Civil Service (Medical): No    Lack of Transportation (Non-Medical): No  Physical Activity: Sufficiently Active (12/09/2021)   Exercise Vital Sign    Days of Exercise per Week: 7 days    Minutes of Exercise per Session: 60 min  Stress: No Stress Concern Present (12/09/2021)   [employer] of Occupational Health - Occupational Stress Questionnaire    Feeling of Stress : Not at all  Social Connections: Moderately Isolated (12/09/2021)   Social Connection and Isolation Panel [NHANES]    Frequency of Communication with Friends and Family: More than three times a week    Frequency of Social Gatherings with Friends and Family: More than three times a week    Attends Religious Services: More than 4 times per year    Active Member of [employer] or Organizations: No    Attends Banker Meetings: Never    Marital Status: Widowed  Intimate Partner Violence: Not At Risk (12/14/2022)   Humiliation, Afraid, Rape, and Kick questionnaire    Fear of Current or Ex-Partner: No    Emotionally Abused: No    Physically Abused: No    Sexually Abused: No    Outpatient Medications Prior to Visit  Medication Sig Dispense Refill   aspirin 81 MG EC tablet Take 81 mg by mouth daily. Swallow whole.     calcium-vitamin D (OSCAL WITH D) 500-200  MG-UNIT tablet Take 1 tablet by mouth 2 (two) times daily.     cholecalciferol (VITAMIN D) 1000 units tablet Take 1 tablet (1,000 Units total) by mouth daily.     Fiber POWD Take 2 Scoops by mouth daily at 6 (six) AM.     ketorolac (ACULAR) 0.5 % ophthalmic solution Place 1 drop into  the right eye in the morning and at bedtime.     Multiple Vitamins-Calcium (ONE-A-DAY WITHIN PO) Take 1 tablet by mouth daily.     Polyethyl Glycol-Propyl Glycol (SYSTANE OP) Place 1 drop into both eyes 3 (three) times daily. Put in both eyes     sodium chloride (MURO 128) 5 % ophthalmic ointment Place 1 application into the right eye at bedtime.     sodium chloride (MURO 128) 5 % ophthalmic solution Place 1 drop into the right eye in the morning and at bedtime.     Vibegron 75 MG TABS Take 1 tablet (75 mg total) by mouth daily. 30 tablet 11   No facility-administered medications prior to visit.    Allergies  Allergen Reactions   Amlodipine Other (See Comments)    Mental status chnages ie confusion   Amoxicillin     hallucinations   Doxycycline Rash    Review of Systems  Constitutional:  Positive for malaise/fatigue. Negative for fever.  HENT:  Negative for congestion.   Eyes:  Negative for blurred vision.  Respiratory:  Negative for shortness of breath.   Cardiovascular:  Negative for chest pain, palpitations and leg swelling.  Gastrointestinal:  Negative for abdominal pain, blood in stool and nausea.  Genitourinary:  Negative for dysuria and frequency.  Musculoskeletal:  Negative for falls.  Skin:  Negative for rash.  Neurological:  Negative for dizziness, loss of consciousness and headaches.  Endo/Heme/Allergies:  Negative for environmental allergies.  Psychiatric/Behavioral:  Negative for depression. The patient is not nervous/anxious.        Objective:    Physical Exam Constitutional:      General: She is not in acute distress.    Appearance: Normal appearance. She is well-developed. She  is not toxic-appearing.  HENT:     Head: Normocephalic and atraumatic.     Right Ear: External ear normal.     Left Ear: External ear normal.     Nose: Nose normal.  Eyes:     General:        Right eye: No discharge.        Left eye: No discharge.     Conjunctiva/sclera: Conjunctivae normal.  Neck:     Thyroid: No thyromegaly.  Cardiovascular:     Rate and Rhythm: Normal rate and regular rhythm.     Heart sounds: Normal heart sounds. No murmur heard. Pulmonary:     Effort: Pulmonary effort is normal. No respiratory distress.     Breath sounds: Normal breath sounds.  Abdominal:     General: Bowel sounds are normal.     Palpations: Abdomen is soft.     Tenderness: There is no abdominal tenderness. There is no guarding.  Musculoskeletal:        General: Normal range of motion.     Cervical back: Neck supple.  Lymphadenopathy:     Cervical: No cervical adenopathy.  Skin:    General: Skin is warm and dry.  Neurological:     Mental Status: She is alert and oriented to person, place, and time.  Psychiatric:        Mood and Affect: Mood normal.        Behavior: Behavior normal.        Thought Content: Thought content normal.        Judgment: Judgment normal.     BP 138/62 (BP Location: Left Arm, Patient Position: Sitting, Cuff Size: Normal)   Pulse 82   Temp 97.8 F (36.6 C) (Oral)  Resp 16   Ht 4\' 11"  (1.499 m)   Wt 127 lb 12.8 oz (58 kg)   SpO2 95%   BMI 25.81 kg/m  Wt Readings from Last 3 Encounters:  07/08/23 127 lb 12.8 oz (58 kg)  01/05/23 133 lb (60.3 kg)  12/14/22 134 lb 12.8 oz (61.1 kg)    Diabetic Foot Exam - Simple   No data filed    Lab Results  Component Value Date   WBC 8.6 07/08/2023   HGB 12.5 07/08/2023   HCT 38.5 07/08/2023   PLT 278.0 07/08/2023   GLUCOSE 94 07/08/2023   CHOL 163 07/08/2023   TRIG 103.0 07/08/2023   HDL 62.40 07/08/2023   LDLCALC 80 07/08/2023   ALT 18 07/08/2023   AST 19 07/08/2023   NA 138 07/08/2023   K 4.2  07/08/2023   CL 102 07/08/2023   CREATININE 0.93 07/08/2023   BUN 27 (H) 07/08/2023   CO2 28 07/08/2023   TSH 3.69 07/08/2023   HGBA1C 6.2 07/08/2023   MICROALBUR 0.8 08/06/2016    Lab Results  Component Value Date   TSH 3.69 07/08/2023   Lab Results  Component Value Date   WBC 8.6 07/08/2023   HGB 12.5 07/08/2023   HCT 38.5 07/08/2023   MCV 96.1 07/08/2023   PLT 278.0 07/08/2023   Lab Results  Component Value Date   NA 138 07/08/2023   K 4.2 07/08/2023   CO2 28 07/08/2023   GLUCOSE 94 07/08/2023   BUN 27 (H) 07/08/2023   CREATININE 0.93 07/08/2023   BILITOT 0.4 07/08/2023   ALKPHOS 56 07/08/2023   AST 19 07/08/2023   ALT 18 07/08/2023   PROT 6.4 07/08/2023   ALBUMIN 4.1 07/08/2023   CALCIUM 10.1 07/08/2023   ANIONGAP 8 09/21/2021   EGFR 50 (L) 01/05/2023   GFR 52.79 (L) 07/08/2023   Lab Results  Component Value Date   CHOL 163 07/08/2023   Lab Results  Component Value Date   HDL 62.40 07/08/2023   Lab Results  Component Value Date   LDLCALC 80 07/08/2023   Lab Results  Component Value Date   TRIG 103.0 07/08/2023   Lab Results  Component Value Date   CHOLHDL 3 07/08/2023   Lab Results  Component Value Date   HGBA1C 6.2 07/08/2023       Assessment & Plan:  Primary hypertension Assessment & Plan: Repeat blood pressure much improved and consistent with the numbers they are seeing at home. No changes today  Orders: -     CBC with Differential/Platelet -     Comprehensive metabolic panel -     TSH  Hyperglycemia Assessment & Plan: hgba1c acceptable, minimize simple carbs. Increase exercise as tolerated.   Orders: -     Hemoglobin A1c  Hyperlipidemia, unspecified hyperlipidemia type Assessment & Plan: Encourage heart healthy diet such as MIND or DASH diet, increase exercise, avoid trans fats, simple carbohydrates and processed foods, consider a krill or fish or flaxseed oil cap daily.   Orders: -     Lipid panel  Osteopenia,  unspecified location Assessment & Plan: Encouraged to get adequate exercise, calcium and vitamin d intake     Assessment and Plan    Urinary Frequency Increased frequency of urination due to age-related bladder muscle weakness. No significant nocturia or distress. Discussed potential referral to urologist and medication options, but patient prefers to manage symptoms without intervention at this time. -No changes to current management.  Hypertension Well-controlled with home monitoring.  No symptoms of elevated blood pressure reported. -Continue current management and home monitoring.  Chronic Kidney Disease Mild elevation in kidney markers noted on previous blood work. No symptoms of kidney disease reported. -Order repeat blood work today to monitor kidney function.  Eye Health Patient has reduced the frequency of Acular eye drops from twice daily to once daily without noticeable change in vision. -Advise patient to inform ophthalmologist of change in medication frequency.  General Health Maintenance -Advise patient to consider Tetanus booster and RSV vaccination at the pharmacy. -Encourage patient to monitor daily fluid intake to ensure adequate hydration. -Schedule follow-up appointment in 4-6 months for annual physical.         Danise Edge, MD

## 2023-07-12 ENCOUNTER — Other Ambulatory Visit: Payer: Self-pay | Admitting: Family Medicine

## 2023-07-12 DIAGNOSIS — Z1231 Encounter for screening mammogram for malignant neoplasm of breast: Secondary | ICD-10-CM

## 2023-08-11 ENCOUNTER — Ambulatory Visit
Admission: RE | Admit: 2023-08-11 | Discharge: 2023-08-11 | Disposition: A | Discharge status: Home / Self Care | Payer: Medicare HMO | Source: Ambulatory Visit | Attending: Family Medicine | Admitting: Family Medicine

## 2023-08-11 DIAGNOSIS — Z1231 Encounter for screening mammogram for malignant neoplasm of breast: Secondary | ICD-10-CM

## 2023-10-13 DIAGNOSIS — H35371 Puckering of macula, right eye: Secondary | ICD-10-CM | POA: Diagnosis not present

## 2023-10-13 DIAGNOSIS — H43811 Vitreous degeneration, right eye: Secondary | ICD-10-CM | POA: Diagnosis not present

## 2023-10-13 DIAGNOSIS — H3581 Retinal edema: Secondary | ICD-10-CM | POA: Diagnosis not present

## 2023-10-13 DIAGNOSIS — H35361 Drusen (degenerative) of macula, right eye: Secondary | ICD-10-CM | POA: Diagnosis not present

## 2023-11-10 DIAGNOSIS — K080 Exfoliation of teeth due to systemic causes: Secondary | ICD-10-CM | POA: Diagnosis not present

## 2023-11-12 ENCOUNTER — Telehealth: Payer: Self-pay | Admitting: Family Medicine

## 2023-11-12 NOTE — Telephone Encounter (Signed)
 Copied from CRM [id number]. Topic: Medicare AWV >> Nov 12, 2023 10:20 AM Nathanel DEL wrote: Reason for CRM: Called LVM 11/12/2023 to schedule AWV. Please schedule Virtual or Telehealth visits ONLY.   Nathanel Paschal; Care Guide Ambulatory Clinical Support [place] [facility] Direct Dial: [phone]

## 2023-12-15 DIAGNOSIS — K080 Exfoliation of teeth due to systemic causes: Secondary | ICD-10-CM | POA: Diagnosis not present

## 2023-12-21 ENCOUNTER — Encounter: Payer: Medicare HMO | Admitting: Family Medicine

## 2023-12-28 DIAGNOSIS — R1030 Lower abdominal pain, unspecified: Secondary | ICD-10-CM | POA: Diagnosis not present

## 2023-12-29 DIAGNOSIS — R109 Unspecified abdominal pain: Secondary | ICD-10-CM | POA: Diagnosis not present

## 2024-01-05 DIAGNOSIS — K080 Exfoliation of teeth due to systemic causes: Secondary | ICD-10-CM | POA: Diagnosis not present

## 2024-01-12 DIAGNOSIS — H35371 Puckering of macula, right eye: Secondary | ICD-10-CM | POA: Diagnosis not present

## 2024-01-12 DIAGNOSIS — H3581 Retinal edema: Secondary | ICD-10-CM | POA: Diagnosis not present

## 2024-01-12 DIAGNOSIS — H35361 Drusen (degenerative) of macula, right eye: Secondary | ICD-10-CM | POA: Diagnosis not present

## 2024-01-12 DIAGNOSIS — H43811 Vitreous degeneration, right eye: Secondary | ICD-10-CM | POA: Diagnosis not present

## 2024-01-20 ENCOUNTER — Ambulatory Visit (INDEPENDENT_AMBULATORY_CARE_PROVIDER_SITE_OTHER): Admitting: Family Medicine

## 2024-01-20 ENCOUNTER — Encounter: Payer: Self-pay | Admitting: Family Medicine

## 2024-01-20 ENCOUNTER — Ambulatory Visit: Payer: Self-pay

## 2024-01-20 VITALS — BP 130/82 | HR 90 | Temp 98.5°F | Ht 59.0 in | Wt 136.6 lb

## 2024-01-20 DIAGNOSIS — K6289 Other specified diseases of anus and rectum: Secondary | ICD-10-CM

## 2024-01-20 DIAGNOSIS — G8929 Other chronic pain: Secondary | ICD-10-CM

## 2024-01-20 MED ORDER — NITROGLYCERIN 0.4 % RE OINT
TOPICAL_OINTMENT | RECTAL | 1 refills | Status: DC
Start: 2024-01-20 — End: 2024-03-06

## 2024-01-20 NOTE — Progress Notes (Signed)
 Chief Complaint  Patient presents with   Acute Visit    Patient presents today for rectal pain.    Subjective: Patient is a 89 y.o. female here for rectal pain. Here w son.   Intermittent rectal pain for 3-4 mo according to her son, 3 to 4 years according to the patient. No evidence of gas/stool in XR done at [facility] where she lives. No trauma or injury. BM's have been normal, not hard/small. No bleeding. Has not tried anything at home. May last for a day at a time without trigger.   Past Medical History:  Diagnosis Date   Allergy    Arthritis    Blind right eye    Cancer (HCC)    breast cancer    Colon polyps    Diverticulitis    Diverticulosis 01/04/2014   Glaucoma    Hernia, inguinal, right    History of chicken pox    childhood   History of hiatal hernia    HTN (hypertension)    Hyperglycemia 09/05/2013   Lipoma of arm 12/09/2014   right   Nocturia 02/08/2013   Osteopenia 12/09/2014   Personal history of colonic polyps 09/05/2013   Pre-diabetes    Preventative health care 08/14/2016   Unspecified constipation 02/06/2013   Urinary incontinence    Viral infection characterized by skin and mucous membrane lesions 05/11/2013   Wears glasses     Objective: BP 130/82   Pulse 90   Temp 98.5 F (36.9 C)   Ht 4\' 11"  (1.499 m)   Wt 136 lb 9.6 oz (62 kg)   SpO2 96%   BMI 27.59 kg/m  General: Awake, appears stated age Abd: BS+, S, NT, ND Lungs: No accessory muscle use Rectal: Examined in the presence of a female chaperone.  Some external skin tags noted without fissures, bleeding, masses, or TTP. Psych: Age appropriate judgment and insight, normal affect and mood  Assessment and Plan: Rectal pain, chronic - Plan: Nitroglycerin 0.4 % OINT  Sounds like proctalgia fugax.  Sitz bath recommended, topical nitro ointment.  If no significant improvement with treatment in the next several weeks, send message and we will consider referral to the GI team. The patient and her son  voiced understanding and agreement to the plan.  Shellie Dials Henderson, DO 01/20/24  3:34 PM

## 2024-01-20 NOTE — Patient Instructions (Addendum)
 Warm sitz baths can be soothing.   OK to take Tylenol 1000 mg (2 extra strength tabs) or 975 mg (3 regular strength tabs) every 6 hours as needed.  Send me a message in a few weeks if not noticeably better.   Let us  know if you need anything.

## 2024-01-20 NOTE — Telephone Encounter (Signed)
 Information obtained from [place].   Chief Complaint: rectal pain Symptoms: intermittent pain Frequency: 3-4 months on/off Pertinent Negatives: Patient denies fever, blood in stool, abd pain Disposition: [] ED /[] Urgent Care (no appt availability in office) / [x] Appointment(In office/virtual)/ []  [facility]/ [] Home Care/ [] Refused Recommended Disposition /[] [facility]/ []  Follow-up with PCP Additional Notes: pt son calling in on behalf of pt states that pt has been having on/off rectal pain for about 3-4 months. Per son it will last about an hour. Pt hx of constipation. Pt scheduled today with pcp office.   Copied from CRM [id number]. Topic: Clinical - Red Word Triage >> Jan 20, 2024  9:27 AM Howard Macho wrote: Red Word that prompted transfer to Nurse Triage: patient son (larry) called stating the patient is having lower abdomen pain off and on. Reason for Disposition  [1] Recurrent episodes of unexplained rectal pain AND [2] NO rectal symptoms now  Answer Assessment - Initial Assessment Questions 1. SYMPTOM:  "What's the main symptom you're concerned about?" (e.g., pain, itching, swelling, rash)     Rectal pain 2. ONSET: "When did the rectal pain  start?"     Intermittent for about 3-4 months 3. RECTAL PAIN: "Do you have any pain around your rectum?" "How bad is the pain?"  (Scale 0-10; or mild, moderate, severe)   - NONE (0): no pain   - MILD (1-3): doesn't interfere with normal activities    - MODERATE (4-7): interferes with normal activities or awakens from sleep, limping    - SEVERE (8-10): excruciating pain, unable to have a bowel movement      moderate 4. RECTAL ITCHING: "Do you have any itching in this area?" "How bad is the itching?"  (Scale 0-10; or mild, moderate, severe)   - NONE: no itching   - MILD: doesn't interfere with normal activities    - MODERATE-SEVERE: interferes with normal activities or awakens from sleep     none 5. CONSTIPATION: "Do you have  constipation?" If Yes, ask: "How bad is it?"     Yes hx of constipation 6. CAUSE: "What do you think is causing the anus symptoms?"     unsure 7. OTHER SYMPTOMS: "Do you have any other symptoms?"  (e.g., abdomen pain, fever, rectal bleeding, vomiting)     denies  Protocols used: Rectal Symptoms-A-AH

## 2024-01-25 DIAGNOSIS — K080 Exfoliation of teeth due to systemic causes: Secondary | ICD-10-CM | POA: Diagnosis not present

## 2024-02-06 NOTE — Assessment & Plan Note (Signed)
Asymptomatic, will continue to monitor 

## 2024-02-06 NOTE — Assessment & Plan Note (Signed)
 hgba1c acceptable, minimize simple carbs. Increase exercise as tolerated.

## 2024-02-06 NOTE — Assessment & Plan Note (Signed)
Repeat blood pressure much improved and consistent with the numbers they are seeing at home. No changes today

## 2024-02-06 NOTE — Assessment & Plan Note (Signed)
 Encourage heart healthy diet such as MIND or DASH diet, increase exercise, avoid trans fats, simple carbohydrates and processed foods, consider a krill or fish or flaxseed oil cap daily.

## 2024-02-07 ENCOUNTER — Ambulatory Visit (INDEPENDENT_AMBULATORY_CARE_PROVIDER_SITE_OTHER): Payer: Medicare HMO | Admitting: Family Medicine

## 2024-02-07 ENCOUNTER — Encounter: Payer: Self-pay | Admitting: Family Medicine

## 2024-02-07 VITALS — BP 126/82 | HR 80 | Resp 16 | Ht 59.0 in | Wt 135.8 lb

## 2024-02-07 DIAGNOSIS — Z0000 Encounter for general adult medical examination without abnormal findings: Secondary | ICD-10-CM | POA: Diagnosis not present

## 2024-02-07 DIAGNOSIS — R739 Hyperglycemia, unspecified: Secondary | ICD-10-CM

## 2024-02-07 DIAGNOSIS — E785 Hyperlipidemia, unspecified: Secondary | ICD-10-CM

## 2024-02-07 DIAGNOSIS — I10 Essential (primary) hypertension: Secondary | ICD-10-CM | POA: Diagnosis not present

## 2024-02-07 DIAGNOSIS — E871 Hypo-osmolality and hyponatremia: Secondary | ICD-10-CM

## 2024-02-07 NOTE — Patient Instructions (Addendum)
 Give us  a copy of your Advanced Directives which includes your Healthcare Power of Attorney and Living Will if you are willing so we can place it in your chart for quick access  Tetanus shot is due at pharmacy or at [facility] especially if ever injured RSV, Respiratory Syncitial Virus Vaccine, Arexvy vaccine at pharmacy Prevnar 20 once at pharmacy                                                     Preventive Care 65 Years and Older, Female Preventive care refers to lifestyle choices and visits with your health care provider that can promote health and wellness. Preventive care visits are also called wellness exams. What can I expect for my preventive care visit? Counseling Your health care provider may ask you questions about your: Medical history, including: Past medical problems. Family medical history. Pregnancy and menstrual history. History of falls. Current health, including: Memory and ability to understand (cognition). Emotional well-being. Home life and relationship well-being. Sexual activity and sexual health. Lifestyle, including: Alcohol, nicotine or tobacco, and drug use. Access to firearms. Diet, exercise, and sleep habits. Work and work Astronomer. Sunscreen use. Safety issues such as seatbelt and bike helmet use. Physical exam Your health care provider will check your: Height and weight. These may be used to calculate your BMI (body mass index). BMI is a measurement that tells if you are at a healthy weight. Waist circumference. This measures the distance around your waistline. This measurement also tells if you are at a healthy weight and may help predict your risk of certain diseases, such as type 2 diabetes and high blood pressure. Heart rate and blood pressure. Body temperature. Skin for abnormal spots. What immunizations do I need?  Vaccines are usually given at various ages, according to a schedule.  Your health care provider will recommend vaccines for you based on your age, medical history, and lifestyle or other factors, such as travel or where you work. What tests do I need? Screening Your health care provider may recommend screening tests for certain conditions. This may include: Lipid and cholesterol levels. Hepatitis C test. Hepatitis B test. HIV (human immunodeficiency virus) test. STI (sexually transmitted infection) testing, if you are at risk. Lung cancer screening. Colorectal cancer screening. Diabetes screening. This is done by checking your blood sugar (glucose) after you have not eaten for a while (fasting). Mammogram. Talk with your health care provider about how often you should have regular mammograms. BRCA-related cancer screening. This may be done if you have a family history of breast, ovarian, tubal, or peritoneal cancers. Bone density scan. This is done to screen for osteoporosis. Talk with your health care provider about your test results, treatment options, and if necessary, the need for more tests. Follow these instructions at home: Eating and drinking  Eat a diet that includes fresh fruits and vegetables, whole grains, lean protein, and low-fat dairy products. Limit your intake of foods with high amounts of sugar, saturated fats, and salt. Take vitamin and mineral supplements as recommended by your health care provider. Do not drink alcohol if your health care provider tells you not to drink. If you drink alcohol: Limit how much you have to 0-1 drink a day. Know how much alcohol is in your drink. In the [place] one drink equals one 12 oz  bottle of beer (355 mL), one 5 oz glass of wine (148 mL), or one 1 oz glass of hard liquor (44 mL). Lifestyle Brush your teeth every morning and night with fluoride toothpaste. Floss one time each day. Exercise for at least 30 minutes 5 or more days each week. Do not use any products that contain nicotine or tobacco. These  products include cigarettes, chewing tobacco, and vaping devices, such as e-cigarettes. If you need help quitting, ask your health care provider. Do not use drugs. If you are sexually active, practice safe sex. Use a condom or other form of protection in order to prevent STIs. Take aspirin only as told by your health care provider. Make sure that you understand how much to take and what form to take. Work with your health care provider to find out whether it is safe and beneficial for you to take aspirin daily. Ask your health care provider if you need to take a cholesterol-lowering medicine (statin). Find healthy ways to manage stress, such as: Meditation, yoga, or listening to music. Journaling. Talking to a trusted person. Spending time with friends and family. Minimize exposure to UV radiation to reduce your risk of skin cancer. Safety Always wear your seat belt while driving or riding in a vehicle. Do not drive: If you have been drinking alcohol. Do not ride with someone who has been drinking. When you are tired or distracted. While texting. If you have been using any mind-altering substances or drugs. Wear a helmet and other protective equipment during sports activities. If you have firearms in your house, make sure you follow all gun safety procedures. What's next? Visit your health care provider once a year for an annual wellness visit. Ask your health care provider how often you should have your eyes and teeth checked. Stay up to date on all vaccines. This information is not intended to replace advice given to you by your health care provider. Make sure you discuss any questions you have with your health care provider. Document Revised: 03/19/2021 Document Reviewed: 03/19/2021 Elsevier Patient Education  2024 [employer].

## 2024-02-07 NOTE — Progress Notes (Signed)
 Subjective:    Patient ID: Lary Point, female    DOB: 05/22/1927, 89 y.o.   MRN: [id number]  Chief Complaint  Patient presents with   Annual Exam    Patient presents today for a physical exam   Quality Metric Gaps    TDAP, AWV    HPI Discussed the use of AI scribe software for clinical note transcription with the patient, who gave verbal consent to proceed.  History of Present Illness Amber Morgan is a 89 year old female who presents for annual preventative exam and follow up on chronic medical concerns. She is accompanied by her son.   She has been experiencing rectal pain for which she was prescribed Proctofoam a couple of weeks ago. The medication has been helpful, particularly in managing discharge associated with the pain. She uses the medication twice daily, which has resulted in substantial improvement in her symptoms. The pain is now primarily present when sitting and is less severe than before starting the medication.  She experiences daily pain, although it is not as intense as prior to treatment. The pain does not bother her as much when standing. She has been using the medication once so far today and plans to use it again tonight. Before starting the medication, the pain was more frequent and severe.  Her bowel movements are regular, occurring daily without the need for straining, and she does not report any blood or melena. She takes Metamucil and drinks approximately 60 ounces of water daily to maintain bowel regularity.  She has a family history of colon cancer, as her son had colon cancer but has been cancer-free for six years. She is the oldest of 13 siblings, with only four siblings remaining, including herself. Her youngest sister passed away recently at the age of 38.  She resides at [facility], a group living environment, and has been there for nearly a year. She previously lived at [facility] for four years.    Past Medical History:  Diagnosis Date    Allergy    Arthritis    Blind right eye    Cancer (HCC)    breast cancer    Colon polyps    Diverticulitis    Diverticulosis 01/04/2014   Glaucoma    Hernia, inguinal, right    History of chicken pox    childhood   History of hiatal hernia    HTN (hypertension)    Hyperglycemia 09/05/2013   Lipoma of arm 12/09/2014   right   Nocturia 02/08/2013   Osteopenia 12/09/2014   Personal history of colonic polyps 09/05/2013   Pre-diabetes    Preventative health care 08/14/2016   Unspecified constipation 02/06/2013   Urinary incontinence    Viral infection characterized by skin and mucous membrane lesions 05/11/2013   Wears glasses     Past Surgical History:  Procedure Laterality Date   ABDOMINAL HYSTERECTOMY     APPENDECTOMY     BLADDER SURGERY  2003   bladder tact   BREAST BIOPSY Right 03/2018   BREAST LUMPECTOMY Right 04/2018   BREAST LUMPECTOMY WITH RADIOACTIVE SEED LOCALIZATION Right 04/12/2018   Procedure: BREAST LUMPECTOMY WITH RADIOACTIVE SEED LOCALIZATION;  Surgeon: Lockie Rima, MD;  Location: [facility] OR;  Service: General;  Laterality: Right;   CATARACT EXTRACTION, BILATERAL     COLONOSCOPY W/ BIOPSIES AND POLYPECTOMY     EYE SURGERY     multiple bilateral   INGUINAL HERNIA REPAIR Right 04/12/2018   Procedure: OPEN RIGHT INGUINAL HERNIA REPAIR  WITH MESH;  Surgeon: Lockie Rima, MD;  Location[facility] OR;  Service: General;  Laterality: Right;   RECTOCELE REPAIR Bilateral     Family History  Problem Relation Age of Onset   Colon cancer Mother    Hypertension Mother    Heart disease Father    Meniere's disease Father    Heart disease Sister    Colon cancer Sister    Alzheimer's disease Sister    Glaucoma Sister    Glaucoma Sister    Hyperlipidemia Sister    Hypertension Sister    Cancer Sister        thyroid    Diabetes Sister    Glaucoma Sister    Arthritis Son        back, spinal stenosis   Glaucoma Son    Hypertension Son    Colon cancer Son    Breast cancer Paternal Aunt     Breast cancer Paternal Aunt     Social History   Socioeconomic History   Marital status: Married    Spouse name: Not on file   Number of children: 4   Years of education: Not on file   Highest education level: Not on file  Occupational History   Not on file  Tobacco Use   Smoking status: Never   Smokeless tobacco: Never  Vaping Use   Vaping status: Never Used  Substance and Sexual Activity   Alcohol use: No   Drug use: No   Sexual activity: Not Currently  Other Topics Concern   Not on file  Social History Narrative   Not on file   Social Drivers of Health   Financial Resource Strain: Low Risk  (12/09/2021)   Overall Financial Resource Strain (CARDIA)    Difficulty of Paying Living Expenses: Not hard at all  Food Insecurity: No Food Insecurity (12/14/2022)   Hunger Vital Sign    Worried About Running Out of Food in the Last Year: Never true    Ran Out of Food in the Last Year: Never true  Transportation Needs: No Transportation Needs (12/09/2021)   PRAPARE - Administrator, Civil Service (Medical): No    Lack of Transportation (Non-Medical): No  Physical Activity: Sufficiently Active (12/09/2021)   Exercise Vital Sign    Days of Exercise per Week: 7 days    Minutes of Exercise per Session: 60 min  Stress: No Stress Concern Present (12/09/2021)   [employer] of Occupational Health - Occupational Stress Questionnaire    Feeling of Stress : Not at all  Social Connections: Moderately Isolated (12/09/2021)   Social Connection and Isolation Panel [NHANES]    Frequency of Communication with Friends and Family: More than three times a week    Frequency of Social Gatherings with Friends and Family: More than three times a week    Attends Religious Services: More than 4 times per year    Active Member of [employer] or Organizations: No    Attends Banker Meetings: Never    Marital Status: Widowed  Intimate Partner Violence: Not At Risk (12/14/2022)    Humiliation, Afraid, Rape, and Kick questionnaire    Fear of Current or Ex-Partner: No    Emotionally Abused: No    Physically Abused: No    Sexually Abused: No    Outpatient Medications Prior to Visit  Medication Sig Dispense Refill   aspirin 81 MG EC tablet Take 81 mg by mouth daily. Swallow whole.     calcium-vitamin D  (OSCAL WITH  D) 500-200 MG-UNIT tablet Take 1 tablet by mouth 2 (two) times daily.     cholecalciferol (VITAMIN D ) 1000 units tablet Take 1 tablet (1,000 Units total) by mouth daily.     Fiber POWD Take 2 Scoops by mouth daily at 6 (six) AM.     ketorolac (ACULAR) 0.5 % ophthalmic solution Place 1 drop into the right eye in the morning and at bedtime.     Multiple Vitamins-Calcium (ONE-A-DAY WITHIN PO) Take 1 tablet by mouth daily.     Nitroglycerin  0.4 % OINT Apply 1 inch (375 mg) ointment twice daily. 30 g 1   Polyethyl Glycol-Propyl Glycol (SYSTANE OP) Place 1 drop into both eyes 3 (three) times daily. Put in both eyes     sodium chloride  (MURO 128) 5 % ophthalmic ointment Place 1 application into the right eye at bedtime.     sodium chloride  (MURO 128) 5 % ophthalmic solution Place 1 drop into the right eye in the morning and at bedtime.     Vibegron  75 MG TABS Take 1 tablet (75 mg total) by mouth daily. 30 tablet 11   No facility-administered medications prior to visit.    Allergies  Allergen Reactions   Amlodipine  Other (See Comments)    Mental status chnages ie confusion   Amoxicillin      hallucinations   Doxycycline  Rash    Review of Systems  Constitutional:  Positive for malaise/fatigue. Negative for chills and fever.  HENT:  Negative for congestion and hearing loss.   Eyes:  Negative for discharge.  Respiratory:  Negative for cough, sputum production and shortness of breath.   Cardiovascular:  Negative for chest pain, palpitations and leg swelling.  Gastrointestinal:  Positive for abdominal pain and constipation. Negative for blood in stool, diarrhea,  heartburn, nausea and vomiting.  Genitourinary:  Negative for dysuria, frequency, hematuria and urgency.  Musculoskeletal:  Negative for back pain, falls and myalgias.  Skin:  Negative for rash.  Neurological:  Negative for dizziness, sensory change, loss of consciousness, weakness and headaches.  Endo/Heme/Allergies:  Negative for environmental allergies. Does not bruise/bleed easily.  Psychiatric/Behavioral:  Negative for depression and suicidal ideas. The patient is not nervous/anxious and does not have insomnia.        Objective:    Physical Exam Constitutional:      General: She is not in acute distress.    Appearance: Normal appearance. She is not diaphoretic.  HENT:     Head: Normocephalic and atraumatic.     Right Ear: Tympanic membrane, ear canal and external ear normal.     Left Ear: Tympanic membrane, ear canal and external ear normal.     Nose: Nose normal.     Mouth/Throat:     Mouth: Mucous membranes are moist.     Pharynx: Oropharynx is clear. No oropharyngeal exudate.  Eyes:     General: No scleral icterus.       Right eye: No discharge.        Left eye: No discharge.     Conjunctiva/sclera: Conjunctivae normal.     Pupils: Pupils are equal, round, and reactive to light.  Neck:     Thyroid : No thyromegaly.  Cardiovascular:     Rate and Rhythm: Normal rate and regular rhythm.     Heart sounds: Normal heart sounds. No murmur heard. Pulmonary:     Effort: Pulmonary effort is normal. No respiratory distress.     Breath sounds: Normal breath sounds. No wheezing or rales.  Abdominal:  General: Bowel sounds are normal. There is no distension.     Palpations: Abdomen is soft. There is no mass.     Tenderness: There is no abdominal tenderness.  Musculoskeletal:        General: No tenderness. Normal range of motion.     Cervical back: Normal range of motion and neck supple.  Lymphadenopathy:     Cervical: No cervical adenopathy.  Skin:    General: Skin is warm  and dry.     Findings: No rash.  Neurological:     General: No focal deficit present.     Mental Status: She is alert and oriented to person, place, and time.     Cranial Nerves: No cranial nerve deficit.     Coordination: Coordination normal.     Deep Tendon Reflexes: Reflexes are normal and symmetric. Reflexes normal.  Psychiatric:        Mood and Affect: Mood normal.        Behavior: Behavior normal.        Thought Content: Thought content normal.        Judgment: Judgment normal.     BP 126/82   Pulse 80   Resp 16   Ht 4\' 11"  (1.499 m)   Wt 135 lb 12.8 oz (61.6 kg)   SpO2 95%   BMI 27.43 kg/m  Wt Readings from Last 3 Encounters:  02/07/24 135 lb 12.8 oz (61.6 kg)  01/20/24 136 lb 9.6 oz (62 kg)  07/08/23 127 lb 12.8 oz (58 kg)    Diabetic Foot Exam - Simple   No data filed    Lab Results  Component Value Date   WBC 8.6 07/08/2023   HGB 12.5 07/08/2023   HCT 38.5 07/08/2023   PLT 278.0 07/08/2023   GLUCOSE 94 07/08/2023   CHOL 163 07/08/2023   TRIG 103.0 07/08/2023   HDL 62.40 07/08/2023   LDLCALC 80 07/08/2023   ALT 18 07/08/2023   AST 19 07/08/2023   NA 138 07/08/2023   K 4.2 07/08/2023   CL 102 07/08/2023   CREATININE 0.93 07/08/2023   BUN 27 (H) 07/08/2023   CO2 28 07/08/2023   TSH 3.69 07/08/2023   HGBA1C 6.2 07/08/2023   MICROALBUR 0.8 08/06/2016    Lab Results  Component Value Date   TSH 3.69 07/08/2023   Lab Results  Component Value Date   WBC 8.6 07/08/2023   HGB 12.5 07/08/2023   HCT 38.5 07/08/2023   MCV 96.1 07/08/2023   PLT 278.0 07/08/2023   Lab Results  Component Value Date   NA 138 07/08/2023   K 4.2 07/08/2023   CO2 28 07/08/2023   GLUCOSE 94 07/08/2023   BUN 27 (H) 07/08/2023   CREATININE 0.93 07/08/2023   BILITOT 0.4 07/08/2023   ALKPHOS 56 07/08/2023   AST 19 07/08/2023   ALT 18 07/08/2023   PROT 6.4 07/08/2023   ALBUMIN 4.1 07/08/2023   CALCIUM 10.1 07/08/2023   ANIONGAP 8 09/21/2021   EGFR 50 (L) 01/05/2023    GFR 52.79 (L) 07/08/2023   Lab Results  Component Value Date   CHOL 163 07/08/2023   Lab Results  Component Value Date   HDL 62.40 07/08/2023   Lab Results  Component Value Date   LDLCALC 80 07/08/2023   Lab Results  Component Value Date   TRIG 103.0 07/08/2023   Lab Results  Component Value Date   CHOLHDL 3 07/08/2023   Lab Results  Component Value Date  HGBA1C 6.2 07/08/2023       Assessment & Plan:  Primary hypertension Assessment & Plan: Repeat blood pressure much improved and consistent with the numbers they are seeing at home. No changes today  Orders: -     Comprehensive metabolic panel with GFR -     CBC with Differential/Platelet  Hyperglycemia Assessment & Plan: hgba1c acceptable, minimize simple carbs. Increase exercise as tolerated.   Orders: -     Comprehensive metabolic panel with GFR -     CBC with Differential/Platelet -     TSH -     Hemoglobin A1c  Hyperlipidemia, unspecified hyperlipidemia type Assessment & Plan: Encourage heart healthy diet such as MIND or DASH diet, increase exercise, avoid trans fats, simple carbohydrates and processed foods, consider a krill or fish or flaxseed oil cap daily.   Orders: -     Lipid panel -     TSH -     Hemoglobin A1c  Hyponatremia Assessment & Plan: Asymptomatic, will continue to monitor    Assessment & Plan Rectal pain Chronic rectal pain improved with nitroglycerin  ointment but persistent daily discomfort. No bowel movement issues, bleeding, or melena. Pain severity decreased with treatment. - Continue nitroglycerin  ointment twice daily. - Consider reducing to once daily if pain improves. - Ensure adequate hydration and fiber intake.  General Health Maintenance Emphasized hydration, fiber intake, and physical activity. Recommended RSV, tetanus, and Prevnar 20 vaccinations. - Ensure at least 60 ounces of water daily. - Continue fiber intake with Metamucil. - Consider RSV vaccine at  pharmacy. - Consider tetanus booster at pharmacy or [employer]. - Consider Prevnar 20 vaccine at pharmacy.  Goals of Care Discussed importance of advanced directives for emergency access. - Provide healthcare power of attorney and living will for medical records.     Randie Bustle, MD

## 2024-02-08 LAB — CBC WITH DIFFERENTIAL/PLATELET
Basophils Absolute: 0.1 10*3/uL (ref 0.0–0.1)
Basophils Relative: 1.3 % (ref 0.0–3.0)
Eosinophils Absolute: 0.1 10*3/uL (ref 0.0–0.7)
Eosinophils Relative: 1 % (ref 0.0–5.0)
HCT: 38.3 % (ref 36.0–46.0)
Hemoglobin: 12.8 g/dL (ref 12.0–15.0)
Lymphocytes Relative: 22.5 % (ref 12.0–46.0)
Lymphs Abs: 2 10*3/uL (ref 0.7–4.0)
MCHC: 33.5 g/dL (ref 30.0–36.0)
MCV: 94.9 fl (ref 78.0–100.0)
Monocytes Absolute: 0.8 10*3/uL (ref 0.1–1.0)
Monocytes Relative: 8.6 % (ref 3.0–12.0)
Neutro Abs: 5.8 10*3/uL (ref 1.4–7.7)
Neutrophils Relative %: 66.6 % (ref 43.0–77.0)
Platelets: 284 10*3/uL (ref 150.0–400.0)
RBC: 4.03 Mil/uL (ref 3.87–5.11)
RDW: 13.3 % (ref 11.5–15.5)
WBC: 8.8 10*3/uL (ref 4.0–10.5)

## 2024-02-08 LAB — COMPREHENSIVE METABOLIC PANEL WITH GFR
ALT: 18 U/L (ref 0–35)
AST: 18 U/L (ref 0–37)
Albumin: 4.2 g/dL (ref 3.5–5.2)
Alkaline Phosphatase: 49 U/L (ref 39–117)
BUN: 36 mg/dL — ABNORMAL HIGH (ref 6–23)
CO2: 29 meq/L (ref 19–32)
Calcium: 10.5 mg/dL (ref 8.4–10.5)
Chloride: 103 meq/L (ref 96–112)
Creatinine, Ser: 1.09 mg/dL (ref 0.40–1.20)
GFR: 43.46 mL/min — ABNORMAL LOW (ref >60.00)
Glucose, Bld: 100 mg/dL — ABNORMAL HIGH (ref 70–99)
Potassium: 4.9 meq/L (ref 3.5–5.1)
Sodium: 140 meq/L (ref 135–145)
Total Bilirubin: 0.3 mg/dL (ref 0.2–1.2)
Total Protein: 6.5 g/dL (ref 6.0–8.3)

## 2024-02-08 LAB — LIPID PANEL
Cholesterol: 175 mg/dL (ref 0–200)
HDL: 64.2 mg/dL (ref >39.00)
LDL Cholesterol: 86 mg/dL (ref 0–99)
NonHDL: 110.79
Total CHOL/HDL Ratio: 3
Triglycerides: 123 mg/dL (ref 0.0–149.0)
VLDL: 24.6 mg/dL (ref 0.0–40.0)

## 2024-02-08 LAB — HEMOGLOBIN A1C: Hgb A1c MFr Bld: 6.4 % (ref 4.6–6.5)

## 2024-02-08 LAB — TSH: TSH: 3.34 u[IU]/mL (ref 0.35–5.50)

## 2024-02-09 ENCOUNTER — Encounter: Payer: Self-pay | Admitting: Family Medicine

## 2024-02-18 DIAGNOSIS — K080 Exfoliation of teeth due to systemic causes: Secondary | ICD-10-CM | POA: Diagnosis not present

## 2024-03-06 ENCOUNTER — Other Ambulatory Visit: Payer: Self-pay | Admitting: Family Medicine

## 2024-03-06 DIAGNOSIS — G8929 Other chronic pain: Secondary | ICD-10-CM

## 2024-03-22 DIAGNOSIS — K080 Exfoliation of teeth due to systemic causes: Secondary | ICD-10-CM | POA: Diagnosis not present

## 2024-05-17 DIAGNOSIS — H401133 Primary open-angle glaucoma, bilateral, severe stage: Secondary | ICD-10-CM | POA: Diagnosis not present

## 2024-05-17 DIAGNOSIS — H524 Presbyopia: Secondary | ICD-10-CM | POA: Diagnosis not present

## 2024-05-17 DIAGNOSIS — H35351 Cystoid macular degeneration, right eye: Secondary | ICD-10-CM | POA: Diagnosis not present

## 2024-05-17 DIAGNOSIS — H52221 Regular astigmatism, right eye: Secondary | ICD-10-CM | POA: Diagnosis not present

## 2024-05-17 DIAGNOSIS — H44522 Atrophy of globe, left eye: Secondary | ICD-10-CM | POA: Diagnosis not present

## 2024-05-17 DIAGNOSIS — H43811 Vitreous degeneration, right eye: Secondary | ICD-10-CM | POA: Diagnosis not present

## 2024-06-19 ENCOUNTER — Other Ambulatory Visit: Payer: Self-pay | Admitting: Obstetrics and Gynecology

## 2024-06-19 DIAGNOSIS — N3281 Overactive bladder: Secondary | ICD-10-CM

## 2024-06-19 DIAGNOSIS — R351 Nocturia: Secondary | ICD-10-CM

## 2024-07-07 ENCOUNTER — Ambulatory Visit: Admitting: Obstetrics and Gynecology

## 2024-08-06 DIAGNOSIS — N289 Disorder of kidney and ureter, unspecified: Secondary | ICD-10-CM | POA: Insufficient documentation

## 2024-08-06 DIAGNOSIS — N183 Chronic kidney disease, stage 3 unspecified: Secondary | ICD-10-CM | POA: Insufficient documentation

## 2024-08-06 NOTE — Assessment & Plan Note (Signed)
 Encourage heart healthy diet such as MIND or DASH diet, increase exercise, avoid trans fats, simple carbohydrates and processed foods, consider a krill or fish or flaxseed oil cap daily.

## 2024-08-06 NOTE — Assessment & Plan Note (Addendum)
 Well controlled, no changes to meds. Encouraged heart healthy diet such as the DASH diet and exercise as tolerated.

## 2024-08-06 NOTE — Assessment & Plan Note (Signed)
 Hydrate and monitor

## 2024-08-06 NOTE — Progress Notes (Signed)
 Subjective:    Patient ID: Amber Morgan, female    DOB: [birth year], 89 y.o.   MRN: [id number]  No chief complaint on file.   HPI Discussed the use of AI scribe software for clinical note transcription with the patient, who gave verbal consent to proceed.  History of Present Illness Amber Morgan is a 89 year old female who presents with urinary incontinence and frequency.  She experiences urinary incontinence and frequency, characterized by a sensation of urgency but difficulty urinating upon reaching the bathroom. These symptoms have worsened recently. She manages leakage by wearing a pad and using toilet paper, and applies Vaseline for discomfort, which she describes as an aching pain when sitting. No burning pain, blood in urine, or itching is present.  She reports having had trouble with her kidneys for the past six years, which she associates with a medication she received. No fevers, chills, or changes in bowel habits are reported. She maintains a good appetite, consuming protein multiple times a day and drinking three bottles of water daily.  Her memory is not as sharp as it used to be, but she still recognizes her son and manages daily activities. She resides in a continuing care retirement community, which provides various levels of care as needed. She walks three to four times a week for about 45 minutes to an hour each time.    Past Medical History:  Diagnosis Date   Allergy    Arthritis    Blind right eye    Cancer (HCC)    breast cancer    Colon polyps    Diverticulitis    Diverticulosis 01/04/2014   Glaucoma    Hernia, inguinal, right    History of chicken pox    childhood   History of hiatal hernia    HTN (hypertension)    Hyperglycemia 09/05/2013   Lipoma of arm 12/09/2014   right   Nocturia 02/08/2013   Osteopenia 12/09/2014   Personal history of colonic polyps 09/05/2013   Pre-diabetes    Preventative health care 08/14/2016   Unspecified constipation 02/06/2013    Urinary incontinence    Viral infection characterized by skin and mucous membrane lesions 05/11/2013   Wears glasses     Past Surgical History:  Procedure Laterality Date   ABDOMINAL HYSTERECTOMY     APPENDECTOMY     BLADDER SURGERY  2003   bladder tact   BREAST BIOPSY Right 03/2018   BREAST LUMPECTOMY Right 04/2018   BREAST LUMPECTOMY WITH RADIOACTIVE SEED LOCALIZATION Right 04/12/2018   Procedure: BREAST LUMPECTOMY WITH RADIOACTIVE SEED LOCALIZATION;  Surgeon: Aron Shoulders, MD;  Location: [facility] OR;  Service: General;  Laterality: Right;   CATARACT EXTRACTION, BILATERAL     COLONOSCOPY W/ BIOPSIES AND POLYPECTOMY     EYE SURGERY     multiple bilateral   INGUINAL HERNIA REPAIR Right 04/12/2018   Procedure: OPEN RIGHT INGUINAL HERNIA REPAIR WITH MESH;  Surgeon: Aron Shoulders, MD;  Location: [facility] OR;  Service: General;  Laterality: Right;   RECTOCELE REPAIR Bilateral     Family History  Problem Relation Age of Onset   Colon cancer Mother    Hypertension Mother    Heart disease Father    Meniere's disease Father    Heart disease Sister    Colon cancer Sister    Alzheimer's disease Sister    Glaucoma Sister    Glaucoma Sister    Hyperlipidemia Sister    Hypertension Sister    Cancer Sister  thyroid    Diabetes Sister    Glaucoma Sister    Arthritis Son        back, spinal stenosis   Glaucoma Son    Hypertension Son    Colon cancer Son    Breast cancer Paternal Aunt    Breast cancer Paternal Aunt     Social History   Socioeconomic History   Marital status: Married    Spouse name: Not on file   Number of children: 4   Years of education: Not on file   Highest education level: Not on file  Occupational History   Not on file  Tobacco Use   Smoking status: Never   Smokeless tobacco: Never  Vaping Use   Vaping status: Never Used  Substance and Sexual Activity   Alcohol use: No   Drug use: No   Sexual activity: Not Currently  Other Topics Concern   Not on file   Social History Narrative   Not on file   Social Drivers of Health   Financial Resource Strain: Low Risk  (12/09/2021)   Overall Financial Resource Strain (CARDIA)    Difficulty of Paying Living Expenses: Not hard at all  Food Insecurity: No Food Insecurity (12/14/2022)   Hunger Vital Sign    Worried About Running Out of Food in the Last Year: Never true    Ran Out of Food in the Last Year: Never true  Transportation Needs: No Transportation Needs (12/09/2021)   PRAPARE - Administrator, Civil Service (Medical): No    Lack of Transportation (Non-Medical): No  Physical Activity: Sufficiently Active (12/09/2021)   Exercise Vital Sign    Days of Exercise per Week: 7 days    Minutes of Exercise per Session: 60 min  Stress: No Stress Concern Present (12/09/2021)   [employer] of Occupational Health - Occupational Stress Questionnaire    Feeling of Stress : Not at all  Social Connections: Moderately Isolated (12/09/2021)   Social Connection and Isolation Panel    Frequency of Communication with Friends and Family: More than three times a week    Frequency of Social Gatherings with Friends and Family: More than three times a week    Attends Religious Services: More than 4 times per year    Active Member of [employer] or Organizations: No    Attends Banker Meetings: Never    Marital Status: Widowed  Intimate Partner Violence: Not At Risk (12/14/2022)   Humiliation, Afraid, Rape, and Kick questionnaire    Fear of Current or Ex-Partner: No    Emotionally Abused: No    Physically Abused: No    Sexually Abused: No    Outpatient Medications Prior to Visit  Medication Sig Dispense Refill   aspirin 81 MG EC tablet Take 81 mg by mouth daily. Swallow whole.     calcium-vitamin D  (OSCAL WITH D) 500-200 MG-UNIT tablet Take 1 tablet by mouth 2 (two) times daily.     cholecalciferol (VITAMIN D ) 1000 units tablet Take 1 tablet (1,000 Units total) by mouth daily.     Fiber POWD  Take 2 Scoops by mouth daily at 6 (six) AM.     ketorolac (ACULAR) 0.5 % ophthalmic solution Place 1 drop into the right eye in the morning and at bedtime.     Multiple Vitamins-Calcium (ONE-A-DAY WITHIN PO) Take 1 tablet by mouth daily.     Nitroglycerin  0.4 % OINT APPLY ONE INCH OINTMENT TWICE DAILY 30 g 0   Polyethyl Glycol-Propyl  Glycol (SYSTANE OP) Place 1 drop into both eyes 3 (three) times daily. Put in both eyes     sodium chloride  (MURO 128) 5 % ophthalmic ointment Place 1 application into the right eye at bedtime.     sodium chloride  (MURO 128) 5 % ophthalmic solution Place 1 drop into the right eye in the morning and at bedtime.     Vibegron  (GEMTESA ) 75 MG TABS Take 1 tablet by mouth once daily 30 tablet 1   No facility-administered medications prior to visit.    Allergies  Allergen Reactions   Amlodipine  Other (See Comments)    Mental status chnages ie confusion   Amoxicillin      hallucinations   Doxycycline  Rash    Review of Systems  Constitutional:  Negative for fever and malaise/fatigue.  HENT:  Negative for congestion.   Eyes:  Negative for blurred vision.  Respiratory:  Negative for shortness of breath.   Cardiovascular:  Negative for chest pain, palpitations and leg swelling.  Gastrointestinal:  Negative for abdominal pain, blood in stool and nausea.  Genitourinary:  Positive for frequency and urgency. Negative for dysuria.  Musculoskeletal:  Negative for falls.  Skin:  Negative for rash.  Neurological:  Negative for dizziness, loss of consciousness and headaches.  Endo/Heme/Allergies:  Negative for environmental allergies.  Psychiatric/Behavioral:  Positive for memory loss. Negative for depression. The patient is nervous/anxious.        Objective:    Physical Exam Constitutional:      General: She is not in acute distress.    Appearance: Normal appearance. She is well-developed. She is not toxic-appearing.  HENT:     Head: Normocephalic and atraumatic.      Right Ear: External ear normal.     Left Ear: External ear normal.     Nose: Nose normal.  Eyes:     General:        Right eye: No discharge.        Left eye: No discharge.     Conjunctiva/sclera: Conjunctivae normal.  Neck:     Thyroid : No thyromegaly.  Cardiovascular:     Rate and Rhythm: Normal rate and regular rhythm.     Heart sounds: Normal heart sounds. No murmur heard. Pulmonary:     Effort: Pulmonary effort is normal. No respiratory distress.     Breath sounds: Normal breath sounds.  Abdominal:     General: Bowel sounds are normal.     Palpations: Abdomen is soft.     Tenderness: There is no abdominal tenderness. There is no guarding.  Musculoskeletal:        General: Normal range of motion.     Cervical back: Neck supple.  Lymphadenopathy:     Cervical: No cervical adenopathy.  Skin:    General: Skin is warm and dry.  Neurological:     Mental Status: She is alert and oriented to person, place, and time.  Psychiatric:        Mood and Affect: Mood normal.        Behavior: Behavior normal.        Thought Content: Thought content normal.        Judgment: Judgment normal.    There were no vitals taken for this visit. Wt Readings from Last 3 Encounters:  02/07/24 135 lb 12.8 oz (61.6 kg)  01/20/24 136 lb 9.6 oz (62 kg)  07/08/23 127 lb 12.8 oz (58 kg)    Diabetic Foot Exam - Simple   No data filed  Lab Results  Component Value Date   WBC 8.8 02/07/2024   HGB 12.8 02/07/2024   HCT 38.3 02/07/2024   PLT 284.0 02/07/2024   GLUCOSE 100 (H) 02/07/2024   CHOL 175 02/07/2024   TRIG 123.0 02/07/2024   HDL 64.20 02/07/2024   LDLCALC 86 02/07/2024   ALT 18 02/07/2024   AST 18 02/07/2024   NA 140 02/07/2024   K 4.9 02/07/2024   CL 103 02/07/2024   CREATININE 1.09 02/07/2024   BUN 36 (H) 02/07/2024   CO2 29 02/07/2024   TSH 3.34 02/07/2024   HGBA1C 6.4 02/07/2024   MICROALBUR 0.3 11/01/2015    Lab Results  Component Value Date   TSH 3.34  02/07/2024   Lab Results  Component Value Date   WBC 8.8 02/07/2024   HGB 12.8 02/07/2024   HCT 38.3 02/07/2024   MCV 94.9 02/07/2024   PLT 284.0 02/07/2024   Lab Results  Component Value Date   NA 140 02/07/2024   K 4.9 02/07/2024   CO2 29 02/07/2024   GLUCOSE 100 (H) 02/07/2024   BUN 36 (H) 02/07/2024   CREATININE 1.09 02/07/2024   BILITOT 0.3 02/07/2024   ALKPHOS 49 02/07/2024   AST 18 02/07/2024   ALT 18 02/07/2024   PROT 6.5 02/07/2024   ALBUMIN 4.2 02/07/2024   CALCIUM 10.5 02/07/2024   ANIONGAP 8 09/21/2021   EGFR 50 (L) 01/05/2023   GFR 43.46 (L) 02/07/2024   Lab Results  Component Value Date   CHOL 175 02/07/2024   Lab Results  Component Value Date   HDL 64.20 02/07/2024   Lab Results  Component Value Date   LDLCALC 86 02/07/2024   Lab Results  Component Value Date   TRIG 123.0 02/07/2024   Lab Results  Component Value Date   CHOLHDL 3 02/07/2024   Lab Results  Component Value Date   HGBA1C 6.4 02/07/2024       Assessment & Plan:  Primary hypertension Assessment & Plan: Well controlled, no changes to meds. Encouraged heart healthy diet such as the DASH diet and exercise as tolerated.     Hyperglycemia Assessment & Plan: hgba1c acceptable, minimize simple carbs. Increase exercise as tolerated.    Hyperlipidemia, unspecified hyperlipidemia type Assessment & Plan: Encourage heart healthy diet such as MIND or DASH diet, increase exercise, avoid trans fats, simple carbohydrates and processed foods, consider a krill or fish or flaxseed oil cap daily.    Chronic renal impairment, stage 3b Assessment & Plan: Hydrate and monitor      Assessment and Plan Assessment & Plan Hesitancy of micturition and incomplete bladder emptying Chronic urinary hesitancy and incomplete bladder emptying, worsened after Moderna vaccine. No pain or hematuria. Able to urinate daily but with difficulty. Aching pain when sitting. No signs of acute urinary  retention. - Obtained urine sample to check for infection - Ordered blood work to assess kidney function and glucose levels - Referred to urogynecologist for further evaluation and management, including potential ultrasound and bladder scans  Chronic kidney disease, stage 3b Chronic kidney disease stage 3b. Monitoring kidney function due to urinary symptoms and potential bladder emptying issues. - Ordered blood work to assess kidney function  General Health Maintenance Due for tetanus booster. Discussed importance of tetanus vaccination every ten years to prevent tetanus infection, especially in case of injury. She is in a continuing care retirement community with resources for memory care if needed. - Recommended tetanus booster at pharmacy - Encouraged regular physical activity and hydration  Recording duration: 19 minutes     Harlene Horton, MD

## 2024-08-06 NOTE — Assessment & Plan Note (Signed)
 hgba1c acceptable, minimize simple carbs. Increase exercise as tolerated.

## 2024-08-10 ENCOUNTER — Encounter: Payer: Self-pay | Admitting: Family Medicine

## 2024-08-10 ENCOUNTER — Ambulatory Visit: Admitting: Family Medicine

## 2024-08-10 VITALS — BP 132/78 | HR 93 | Temp 98.3°F | Resp 16 | Ht 59.0 in | Wt 141.4 lb

## 2024-08-10 DIAGNOSIS — E785 Hyperlipidemia, unspecified: Secondary | ICD-10-CM

## 2024-08-10 DIAGNOSIS — N2889 Other specified disorders of kidney and ureter: Secondary | ICD-10-CM

## 2024-08-10 DIAGNOSIS — R739 Hyperglycemia, unspecified: Secondary | ICD-10-CM | POA: Diagnosis not present

## 2024-08-10 DIAGNOSIS — N1832 Chronic kidney disease, stage 3b: Secondary | ICD-10-CM

## 2024-08-10 DIAGNOSIS — I10 Essential (primary) hypertension: Secondary | ICD-10-CM

## 2024-08-10 DIAGNOSIS — R3911 Hesitancy of micturition: Secondary | ICD-10-CM

## 2024-08-10 NOTE — Patient Instructions (Signed)
 Tetanus vaccine is due  All [facility] pharmacies are now walk in vaccine clinics M-F 9-4

## 2024-08-11 ENCOUNTER — Other Ambulatory Visit (INDEPENDENT_AMBULATORY_CARE_PROVIDER_SITE_OTHER)

## 2024-08-11 DIAGNOSIS — R3911 Hesitancy of micturition: Secondary | ICD-10-CM | POA: Diagnosis not present

## 2024-08-11 LAB — COMPREHENSIVE METABOLIC PANEL WITH GFR
ALT: 18 U/L (ref 0–35)
AST: 18 U/L (ref 0–37)
Albumin: 4.1 g/dL (ref 3.5–5.2)
Alkaline Phosphatase: 55 U/L (ref 39–117)
BUN: 29 mg/dL — ABNORMAL HIGH (ref 6–23)
CO2: 27 meq/L (ref 19–32)
Calcium: 10.1 mg/dL (ref 8.4–10.5)
Chloride: 103 meq/L (ref 96–112)
Creatinine, Ser: 0.97 mg/dL (ref 0.40–1.20)
GFR: 49.81 mL/min — ABNORMAL LOW (ref >60.00)
Glucose, Bld: 108 mg/dL — ABNORMAL HIGH (ref 70–99)
Potassium: 4.5 meq/L (ref 3.5–5.1)
Sodium: 140 meq/L (ref 135–145)
Total Bilirubin: 0.4 mg/dL (ref 0.2–1.2)
Total Protein: 6.4 g/dL (ref 6.0–8.3)

## 2024-08-11 LAB — LIPID PANEL
Cholesterol: 167 mg/dL (ref 0–200)
HDL: 60.9 mg/dL (ref >39.00)
LDL Cholesterol: 85 mg/dL (ref 0–99)
NonHDL: 106.42
Total CHOL/HDL Ratio: 3
Triglycerides: 108 mg/dL (ref 0.0–149.0)
VLDL: 21.6 mg/dL (ref 0.0–40.0)

## 2024-08-11 LAB — CBC WITH DIFFERENTIAL/PLATELET
Basophils Absolute: 0.1 K/uL (ref 0.0–0.1)
Basophils Relative: 0.8 % (ref 0.0–3.0)
Eosinophils Absolute: 0.1 K/uL (ref 0.0–0.7)
Eosinophils Relative: 1.1 % (ref 0.0–5.0)
HCT: 36.7 % (ref 36.0–46.0)
Hemoglobin: 12.3 g/dL (ref 12.0–15.0)
Lymphocytes Relative: 24.4 % (ref 12.0–46.0)
Lymphs Abs: 1.6 K/uL (ref 0.7–4.0)
MCHC: 33.6 g/dL (ref 30.0–36.0)
MCV: 94.3 fl (ref 78.0–100.0)
Monocytes Absolute: 0.6 K/uL (ref 0.1–1.0)
Monocytes Relative: 8.4 % (ref 3.0–12.0)
Neutro Abs: 4.4 K/uL (ref 1.4–7.7)
Neutrophils Relative %: 65.3 % (ref 43.0–77.0)
Platelets: 258 K/uL (ref 150.0–400.0)
RBC: 3.9 Mil/uL (ref 3.87–5.11)
RDW: 13.7 % (ref 11.5–15.5)
WBC: 6.7 K/uL (ref 4.0–10.5)

## 2024-08-11 LAB — URINALYSIS, ROUTINE W REFLEX MICROSCOPIC
Bilirubin Urine: NEGATIVE
Hgb urine dipstick: NEGATIVE
Ketones, ur: NEGATIVE
Leukocytes,Ua: NEGATIVE
Nitrite: NEGATIVE
RBC / HPF: NONE SEEN (ref 0–?)
Specific Gravity, Urine: 1.01 (ref 1.000–1.030)
Total Protein, Urine: NEGATIVE
Urine Glucose: NEGATIVE
Urobilinogen, UA: 0.2 (ref 0.0–1.0)
pH: 6.5 (ref 5.0–8.0)

## 2024-08-11 LAB — TSH: TSH: 2.53 u[IU]/mL (ref 0.35–5.50)

## 2024-08-11 LAB — HEMOGLOBIN A1C: Hgb A1c MFr Bld: 6.4 % (ref 4.6–6.5)

## 2024-08-12 LAB — URINE CULTURE
MICRO NUMBER:: 17206513
Result:: NO GROWTH
SPECIMEN QUALITY:: ADEQUATE

## 2024-08-13 ENCOUNTER — Ambulatory Visit: Payer: Self-pay | Admitting: Family Medicine

## 2024-08-16 DIAGNOSIS — H3581 Retinal edema: Secondary | ICD-10-CM | POA: Diagnosis not present

## 2024-08-16 DIAGNOSIS — H35371 Puckering of macula, right eye: Secondary | ICD-10-CM | POA: Diagnosis not present

## 2024-08-16 DIAGNOSIS — H43811 Vitreous degeneration, right eye: Secondary | ICD-10-CM | POA: Diagnosis not present

## 2024-08-16 DIAGNOSIS — H35361 Drusen (degenerative) of macula, right eye: Secondary | ICD-10-CM | POA: Diagnosis not present

## 2024-08-18 ENCOUNTER — Other Ambulatory Visit: Payer: Self-pay | Admitting: Obstetrics and Gynecology

## 2024-08-18 DIAGNOSIS — N3281 Overactive bladder: Secondary | ICD-10-CM

## 2024-08-18 DIAGNOSIS — R351 Nocturia: Secondary | ICD-10-CM

## 2024-08-20 ENCOUNTER — Other Ambulatory Visit: Payer: Self-pay | Admitting: Obstetrics and Gynecology

## 2024-08-20 DIAGNOSIS — N3281 Overactive bladder: Secondary | ICD-10-CM

## 2024-08-20 DIAGNOSIS — R351 Nocturia: Secondary | ICD-10-CM

## 2024-08-21 ENCOUNTER — Telehealth: Payer: Self-pay

## 2024-08-21 DIAGNOSIS — R351 Nocturia: Secondary | ICD-10-CM

## 2024-08-21 DIAGNOSIS — N3281 Overactive bladder: Secondary | ICD-10-CM

## 2024-08-21 MED ORDER — GEMTESA 75 MG PO TABS: 1.0000 | ORAL_TABLET | Freq: Every day | ORAL | 0 refills | Status: AC

## 2024-08-21 NOTE — Telephone Encounter (Signed)
 Patient son called requesting a medication refill for Gemtesa . Patient has an upcoiming appointment so I refilled for one month supply.

## 2024-08-28 ENCOUNTER — Encounter: Payer: Self-pay | Admitting: Obstetrics and Gynecology

## 2024-08-28 ENCOUNTER — Ambulatory Visit: Admitting: Obstetrics and Gynecology

## 2024-08-28 DIAGNOSIS — N3281 Overactive bladder: Secondary | ICD-10-CM | POA: Diagnosis not present

## 2024-08-28 DIAGNOSIS — R351 Nocturia: Secondary | ICD-10-CM

## 2024-08-28 MED ORDER — GEMTESA 75 MG PO TABS: 1.0000 | ORAL_TABLET | Freq: Every day | ORAL | 11 refills | Status: AC

## 2024-08-28 NOTE — Progress Notes (Signed)
 [facility] Return Visit  SUBJECTIVE  History of Present Illness: Amber Morgan is a 89 y.o. female seen in follow-up for OAB/ Nocturia. She is on Gemtesa  75mg  daily.   Overall she is happy with how Gemtesa  is working. She still has some occasional leakage but is able to hold her urine longer. She has had no new major medical issues since she was last seen.   Her son is concerned that the cost of the medication may be increasing next year.   Past Medical History: Patient  has a past medical history of Allergy, Arthritis, Blind right eye, Cancer (HCC), Colon polyps, Diverticulitis, Diverticulosis (01/04/2014), Glaucoma, Hernia, inguinal, right, History of chicken pox, History of hiatal hernia, HTN (hypertension), Hyperglycemia (09/05/2013), Lipoma of arm (12/09/2014), Nocturia (02/08/2013), Osteopenia (12/09/2014), Personal history of colonic polyps (09/05/2013), Pre-diabetes, Preventative health care (08/14/2016), Unspecified constipation (02/06/2013), Urinary incontinence, Viral infection characterized by skin and mucous membrane lesions (05/11/2013), and Wears glasses.   Past Surgical History: She  has a past surgical history that includes Eye surgery; Bladder surgery (2003); Abdominal hysterectomy; Rectocele repair (Bilateral); Cataract extraction, bilateral; Appendectomy; Colonoscopy w/ biopsies and polypectomy; Breast lumpectomy with radioactive seed localization (Right, 04/12/2018); Inguinal hernia repair (Right, 04/12/2018); Breast lumpectomy (Right, 04/2018); and Breast biopsy (Right, 03/2018).   Medications: She has a current medication list which includes the following prescription(s): aspirin ec, calcium-vitamin d , cholecalciferol, fiber, ketorolac, multiple vitamins-calcium, polyethyl glycol-propyl glycol, sodium chloride , sodium chloride , gemtesa , gemtesa , and nitroglycerin .   Allergies: Patient is allergic to amlodipine , amoxicillin , and doxycycline .   Social History: Patient   reports that she has never smoked. She has never used smokeless tobacco. She reports that she does not drink alcohol and does not use drugs.      OBJECTIVE     Physical Exam: Vitals:   08/28/24 1425  BP: (!) 146/69  Pulse: 69     Gen: No apparent distress, A&O x 3.  Detailed Urogynecologic Evaluation:  Deferred.   ASSESSMENT AND PLAN    Amber Morgan is a 89 y.o. with:  1. Overactive bladder   2. Nocturia     - Continue with Gemtesa  75mg  daily, refills provided.  - If cost of medication increases in the new year, then can plan to switch to a different medication.   Follow up 1 year or sooner if needed  Rosaline LOISE Caper, MD   Time spent: I spent 20 minutes dedicated to the care of this patient on the date of this encounter to include pre-visit review of records, face-to-face time with the patient and post visit documentation.

## 2024-09-15 ENCOUNTER — Other Ambulatory Visit: Payer: Self-pay | Admitting: Family Medicine

## 2024-09-15 DIAGNOSIS — Z1231 Encounter for screening mammogram for malignant neoplasm of breast: Secondary | ICD-10-CM

## 2024-09-20 DIAGNOSIS — K080 Exfoliation of teeth due to systemic causes: Secondary | ICD-10-CM | POA: Diagnosis not present

## 2024-09-21 DIAGNOSIS — K080 Exfoliation of teeth due to systemic causes: Secondary | ICD-10-CM | POA: Diagnosis not present

## 2024-10-12 ENCOUNTER — Ambulatory Visit
Admission: RE | Admit: 2024-10-12 | Discharge: 2024-10-12 | Disposition: A | Discharge status: Home / Self Care | Source: Ambulatory Visit | Attending: Family Medicine | Admitting: Family Medicine

## 2024-10-12 DIAGNOSIS — Z1231 Encounter for screening mammogram for malignant neoplasm of breast: Secondary | ICD-10-CM

## 2025-02-08 ENCOUNTER — Ambulatory Visit: Admitting: Family Medicine

## 2025-02-12 ENCOUNTER — Ambulatory Visit: Admitting: Family Medicine

## 2025-02-22 ENCOUNTER — Ambulatory Visit: Admitting: Family Medicine
